# Patient Record
Sex: Female | Born: 1945 | Race: White | Hispanic: No | Marital: Married | Smoking: Never smoker
Health system: Southern US, Community
[De-identification: ages and names within clinical notes are randomized; demographics above are authoritative.]

## PROBLEM LIST (undated history)

## (undated) DIAGNOSIS — I10 Essential (primary) hypertension: Secondary | ICD-10-CM

## (undated) DIAGNOSIS — R42 Dizziness and giddiness: Secondary | ICD-10-CM

## (undated) DIAGNOSIS — E119 Type 2 diabetes mellitus without complications: Secondary | ICD-10-CM

## (undated) DIAGNOSIS — M1990 Unspecified osteoarthritis, unspecified site: Secondary | ICD-10-CM

## (undated) DIAGNOSIS — E785 Hyperlipidemia, unspecified: Secondary | ICD-10-CM

## (undated) DIAGNOSIS — K219 Gastro-esophageal reflux disease without esophagitis: Secondary | ICD-10-CM

## (undated) DIAGNOSIS — G459 Transient cerebral ischemic attack, unspecified: Secondary | ICD-10-CM

## (undated) HISTORY — PX: BREAST BIOPSY: SHX20

## (undated) HISTORY — DX: Type 2 diabetes mellitus without complications: E11.9

## (undated) HISTORY — DX: Hyperlipidemia, unspecified: E78.5

## (undated) HISTORY — DX: Essential (primary) hypertension: I10

## (undated) HISTORY — PX: ABDOMINAL HYSTERECTOMY: SHX81

## (undated) HISTORY — PX: TOTAL ABDOMINAL HYSTERECTOMY: SHX209

---

## 1988-03-24 HISTORY — PX: SIGMOIDOSCOPY: SUR1295

## 2010-07-03 ENCOUNTER — Ambulatory Visit: Payer: Self-pay | Admitting: Family Medicine

## 2011-07-16 ENCOUNTER — Ambulatory Visit: Payer: Self-pay | Admitting: Family Medicine

## 2014-08-16 DIAGNOSIS — E119 Type 2 diabetes mellitus without complications: Secondary | ICD-10-CM | POA: Diagnosis not present

## 2014-08-16 DIAGNOSIS — E785 Hyperlipidemia, unspecified: Secondary | ICD-10-CM | POA: Diagnosis not present

## 2014-08-16 DIAGNOSIS — I10 Essential (primary) hypertension: Secondary | ICD-10-CM | POA: Diagnosis not present

## 2014-09-12 ENCOUNTER — Other Ambulatory Visit: Payer: Self-pay | Admitting: Family Medicine

## 2014-09-12 DIAGNOSIS — Z1231 Encounter for screening mammogram for malignant neoplasm of breast: Secondary | ICD-10-CM

## 2014-09-20 ENCOUNTER — Ambulatory Visit
Admission: RE | Admit: 2014-09-20 | Discharge: 2014-09-20 | Disposition: A | Discharge status: Home / Self Care | Payer: Medicare Other | Source: Ambulatory Visit | Attending: Family Medicine | Admitting: Family Medicine

## 2014-09-20 ENCOUNTER — Other Ambulatory Visit: Payer: Self-pay | Admitting: Family Medicine

## 2014-09-20 DIAGNOSIS — Z1231 Encounter for screening mammogram for malignant neoplasm of breast: Secondary | ICD-10-CM | POA: Diagnosis not present

## 2014-09-20 LAB — HM MAMMOGRAPHY

## 2014-09-21 ENCOUNTER — Encounter: Payer: Self-pay | Admitting: Family Medicine

## 2014-11-15 ENCOUNTER — Ambulatory Visit: Payer: Self-pay | Admitting: Family Medicine

## 2015-03-15 ENCOUNTER — Other Ambulatory Visit: Payer: Self-pay | Admitting: Family Medicine

## 2015-03-15 NOTE — Telephone Encounter (Signed)
Patient was last seen in May 2016 ([facility] notes and labs reviewed) She was due to return in August Please let Caren HazyNancy J Malenfant know that she should see Dr. Laural BenesJOHNSON for an appointment here in the office for:  Diabetes, cholesterol, etc. Please schedule a visit with Dr. Laural BenesJohnson in the next: 2-3 weeks Fasting?  YES please Thank you, Dr. Kayleen MemosLada I'll approve limited medicine refill, but follow-up is very important

## 2015-03-28 ENCOUNTER — Encounter: Payer: Self-pay | Admitting: Family Medicine

## 2015-03-28 ENCOUNTER — Ambulatory Visit (INDEPENDENT_AMBULATORY_CARE_PROVIDER_SITE_OTHER): Payer: Medicare Other | Admitting: Family Medicine

## 2015-03-28 VITALS — BP 161/97 | HR 79 | Temp 97.9°F | Ht 64.6 in | Wt 201.0 lb

## 2015-03-28 DIAGNOSIS — Z9189 Other specified personal risk factors, not elsewhere classified: Secondary | ICD-10-CM | POA: Diagnosis not present

## 2015-03-28 DIAGNOSIS — E1122 Type 2 diabetes mellitus with diabetic chronic kidney disease: Secondary | ICD-10-CM | POA: Diagnosis not present

## 2015-03-28 DIAGNOSIS — F32A Depression, unspecified: Secondary | ICD-10-CM | POA: Insufficient documentation

## 2015-03-28 DIAGNOSIS — Z113 Encounter for screening for infections with a predominantly sexual mode of transmission: Secondary | ICD-10-CM | POA: Diagnosis not present

## 2015-03-28 DIAGNOSIS — F329 Major depressive disorder, single episode, unspecified: Secondary | ICD-10-CM

## 2015-03-28 DIAGNOSIS — N182 Chronic kidney disease, stage 2 (mild): Secondary | ICD-10-CM

## 2015-03-28 DIAGNOSIS — E039 Hypothyroidism, unspecified: Secondary | ICD-10-CM | POA: Diagnosis not present

## 2015-03-28 DIAGNOSIS — Z23 Encounter for immunization: Secondary | ICD-10-CM

## 2015-03-28 DIAGNOSIS — E119 Type 2 diabetes mellitus without complications: Secondary | ICD-10-CM | POA: Insufficient documentation

## 2015-03-28 DIAGNOSIS — R635 Abnormal weight gain: Secondary | ICD-10-CM

## 2015-03-28 DIAGNOSIS — I129 Hypertensive chronic kidney disease with stage 1 through stage 4 chronic kidney disease, or unspecified chronic kidney disease: Secondary | ICD-10-CM | POA: Insufficient documentation

## 2015-03-28 DIAGNOSIS — E785 Hyperlipidemia, unspecified: Secondary | ICD-10-CM | POA: Insufficient documentation

## 2015-03-28 LAB — MICROSCOPIC EXAMINATION: RBC, UA: NONE SEEN /hpf (ref 0–?)

## 2015-03-28 LAB — BAYER DCA HB A1C WAIVED: HB A1C: 7.4 % — AB (ref <7.0)

## 2015-03-28 LAB — LP+ALT+AST PICCOLO, WAIVED
ALT (SGPT) PICCOLO, WAIVED: 22 U/L (ref 10–47)
AST (SGOT) PICCOLO, WAIVED: 21 U/L (ref 11–38)
CHOLESTEROL PICCOLO, WAIVED: 268 mg/dL — AB (ref <200)
Chol/HDL Ratio Piccolo,Waive: 4 mg/dL
HDL Chol Piccolo, Waived: 67 mg/dL (ref >59)
LDL Chol Calc Piccolo Waived: 171 mg/dL — ABNORMAL HIGH (ref <100)
Triglycerides Piccolo,Waived: 152 mg/dL — ABNORMAL HIGH (ref <150)
VLDL Chol Calc Piccolo,Waive: 30 mg/dL — ABNORMAL HIGH (ref <30)

## 2015-03-28 LAB — MICROALBUMIN, URINE WAIVED
CREATININE, URINE WAIVED: 50 mg/dL (ref 10–300)
MICROALB, UR WAIVED: 30 mg/L — AB (ref 0–19)

## 2015-03-28 MED ORDER — METFORMIN HCL 1000 MG PO TABS: 1000.0000 mg | ORAL_TABLET | Freq: Two times a day (BID) | ORAL | Status: DC

## 2015-03-28 MED ORDER — GLIPIZIDE 5 MG PO TABS: 5.0000 mg | ORAL_TABLET | Freq: Every day | ORAL | Status: DC

## 2015-03-28 MED ORDER — LISINOPRIL-HYDROCHLOROTHIAZIDE 20-25 MG PO TABS: 2.0000 | ORAL_TABLET | Freq: Every day | ORAL | Status: DC

## 2015-03-28 MED ORDER — BUPROPION HCL ER (SR) 150 MG PO TB12: ORAL_TABLET | ORAL | Status: DC

## 2015-03-28 MED ORDER — LOVASTATIN 40 MG PO TABS: 40.0000 mg | ORAL_TABLET | Freq: Every day | ORAL | Status: DC

## 2015-03-28 NOTE — Assessment & Plan Note (Signed)
A1c going in the wrong direction. Likely due to dietary issues. Will work on diet and monitor. Recheck A1c in 3 months, no change to medication at this time.

## 2015-03-28 NOTE — Progress Notes (Signed)
BP 161/97 mmHg  Pulse 79  Temp(Src) 97.9 F (36.6 C)  Ht 5' 4.6" (1.641 m)  Wt 201 lb (91.173 kg)  BMI 33.86 kg/m2  SpO2 99%   Subjective:    Patient ID: Alison Hernandez, female    DOB: Nov 28, 1945, 70 y.o.   MRN: [id number]  HPI: Alison Hernandez is a 70 y.o. female  Chief Complaint  Patient presents with  . Diabetes  . Hypertension   DIABETES- only taking the metformin daily for the last week, has been eating a lot due to stress.  Hypoglycemic episodes:no Polydipsia/polyuria: yes Visual disturbance: no Chest pain: no Paresthesias: no Glucose Monitoring: no  Accucheck frequency: Not Checking Taking Insulin?: no Blood Pressure Monitoring: not checking Retinal Examination: Not up to Date Foot Exam: Up to Date Diabetic Education: Completed Pneumovax: Not up to Date Influenza: Up to Date Aspirin: no  HYPERTENSION / HYPERLIPIDEMIA Satisfied with current treatment? no Duration of hypertension: chronic BP monitoring frequency: not checking BP medication side effects: no Duration of hyperlipidemia: chronic Cholesterol medication side effects: Not on anything Cholesterol supplements: none Past cholesterol medications: atorvastain (lipitor), lovastatin (mevacor), pravastatin (pravachol) and simvastatin (zocor)- bad headaches on lipitor, body aches on zocor Medication compliance: Not on anything Aspirin: no Recent stressors: yes Recurrent headaches: no Visual changes: no Palpitations: no Dyspnea: no Chest pain: no Lower extremity edema: no Dizzy/lightheaded: no  DEPRESSION- really depressed. Home life is not good. Husband won't speak to her. Feels very depressed. Mood status: uncontrolled Satisfied with current treatment?: no Symptom severity: moderate  Duration of current treatment : Not on anything Psychotherapy/counseling: no  Previous psychiatric medications: None Depressed mood: yes Anxious mood: yes Anhedonia: yes Significant weight loss or gain:  yes Insomnia: no  Fatigue: yes Feelings of worthlessness or guilt: yes Impaired concentration/indecisiveness: yes Suicidal ideations: no Hopelessness: no Crying spells: yes Depression screen PHQ 2/9 03/28/2015  Decreased Interest 0  Down, Depressed, Hopeless 0  PHQ - 2 Score 0    Relevant past medical, surgical, family and social history reviewed and updated as indicated. Interim medical history since our last visit reviewed. Allergies and medications reviewed and updated.  Review of Systems  Constitutional: Negative.   Respiratory: Negative.   Cardiovascular: Negative.   Gastrointestinal: Negative.   Musculoskeletal: Negative.   Psychiatric/Behavioral: Negative.     Per HPI unless specifically indicated above     Objective:    BP 161/97 mmHg  Pulse 79  Temp(Src) 97.9 F (36.6 C)  Ht 5' 4.6" (1.641 m)  Wt 201 lb (91.173 kg)  BMI 33.86 kg/m2  SpO2 99%  Wt Readings from Last 3 Encounters:  03/28/15 201 lb (91.173 kg)  08/16/14 186 lb (84.369 kg)    Physical Exam  Constitutional: She is oriented to person, place, and time. She appears well-developed and well-nourished. No distress.  HENT:  Head: Normocephalic and atraumatic.  Right Ear: Hearing and external ear normal.  Left Ear: Hearing and external ear normal.  Nose: Nose normal.  Mouth/Throat: Oropharynx is clear and moist.  Eyes: Conjunctivae, EOM and lids are normal. Pupils are equal, round, and reactive to light. Right eye exhibits no discharge. Left eye exhibits no discharge. No scleral icterus.  Cardiovascular: Normal rate, regular rhythm, normal heart sounds and intact distal pulses.  Exam reveals no gallop and no friction rub.   No murmur heard. Pulmonary/Chest: Effort normal and breath sounds normal. No respiratory distress. She has no wheezes. She has no rales. She exhibits no tenderness.  Musculoskeletal: Normal  range of motion. She exhibits no edema or tenderness.  Neurological: She is alert and  oriented to person, place, and time.  Skin: Skin is warm, dry and intact. No rash noted. No erythema. No pallor.  Psychiatric: She has a normal mood and affect. Her speech is normal and behavior is normal. Judgment and thought content normal. Cognition and memory are normal.  Nursing note and vitals reviewed.   No results found for this or any previous visit.    Assessment & Plan:   Problem List Items Addressed This Visit      Endocrine   Diabetes (HCC) - Primary    A1c going in the wrong direction. Likely due to dietary issues. Will work on diet and monitor. Recheck A1c in 3 months, no change to medication at this time.       Relevant Medications   lovastatin (MEVACOR) 40 MG tablet   metFORMIN (GLUCOPHAGE) 1000 MG tablet   glipiZIDE (GLUCOTROL) 5 MG tablet   lisinopril-hydrochlorothiazide (PRINZIDE,ZESTORETIC) 20-25 MG tablet   Other Relevant Orders   Comprehensive metabolic panel   Bayer DCA Hb Z6X Waived   UA/M w/rflx Culture, Routine   Microalbumin, Urine Waived     Genitourinary   Benign hypertensive renal disease    Not well controlled. Will increase medication and check back in in 1 month. Call if BP going low or if she is getting dizzy, hopefully will be able to cut back down if she loses weight again.       Relevant Orders   Comprehensive metabolic panel   CBC With Differential/Platelet   UA/M w/rflx Culture, Routine   Microalbumin, Urine Waived     Other   Hyperlipidemia    Uncontrolled and going up. Will restart statin and check back in 1 month with repeat lipids and liver function to confirm tolerance.       Relevant Medications   lovastatin (MEVACOR) 40 MG tablet   lisinopril-hydrochlorothiazide (PRINZIDE,ZESTORETIC) 20-25 MG tablet   Other Relevant Orders   Comprehensive metabolic panel   LP+ALT+AST Piccolo, Waived   Depression    Not well controlled. Will try wellbutrin. Risks and benefits discussed. Continue to monitor and recheck in 1 month.        Relevant Medications   buPROPion (WELLBUTRIN SR) 150 MG 12 hr tablet    Other Visit Diagnoses    Weight gain        Will check labs. Likely due to poor diet. Work on diet and exercise and recheck in 1 month.     Relevant Orders    TSH    Immunization due        Flu given today. Td next visit if indicated. Will check on pneumovax status.    Relevant Orders    Flu Vaccine QUAD 36+ mos PF IM (Fluarix & Fluzone Quad PF) (Completed)    Routine screening for STI (sexually transmitted infection)        Labs ordered today. Await results.     Relevant Orders    Hepatitis C antibody    At high risk for osteoporosis        DEXA ordered today. Await results.     Relevant Orders    DG Bone Density        Follow up plan: Return in about 4 weeks (around 04/25/2015) for DM visit.

## 2015-03-28 NOTE — Assessment & Plan Note (Signed)
Uncontrolled and going up. Will restart statin and check back in 1 month with repeat lipids and liver function to confirm tolerance.

## 2015-03-28 NOTE — Assessment & Plan Note (Signed)
Not well controlled. Will increase medication and check back in in 1 month. Call if BP going low or if she is getting dizzy, hopefully will be able to cut back down if she loses weight again.

## 2015-03-28 NOTE — Assessment & Plan Note (Signed)
Not well controlled. Will try wellbutrin. Risks and benefits discussed. Continue to monitor and recheck in 1 month.

## 2015-03-29 ENCOUNTER — Encounter: Payer: Self-pay | Admitting: Family Medicine

## 2015-03-30 LAB — URINE CULTURE, REFLEX

## 2015-04-01 LAB — UA/M W/RFLX CULTURE, ROUTINE: Organism ID, Bacteria: NO GROWTH

## 2015-04-05 LAB — HEPATITIS C ANTIBODY: Hep C Virus Ab: <0.1 s/co ratio (ref 0.0–0.9)

## 2015-04-05 LAB — COMPREHENSIVE METABOLIC PANEL
A/G RATIO: 1.6 (ref 1.1–2.5)
ALBUMIN: 4.1 g/dL (ref 3.6–4.8)
ALT: 14 IU/L (ref 0–32)
AST: 13 IU/L (ref 0–40)
Alkaline Phosphatase: 85 IU/L (ref 39–117)
BILIRUBIN TOTAL: 0.2 mg/dL (ref 0.0–1.2)
BUN / CREAT RATIO: 18 (ref 11–26)
BUN: 19 mg/dL (ref 8–27)
CALCIUM: 9.2 mg/dL (ref 8.7–10.3)
CHLORIDE: 97 mmol/L (ref 96–106)
CO2: 23 mmol/L (ref 18–29)
Creatinine, Ser: 1.05 mg/dL — ABNORMAL HIGH (ref 0.57–1.00)
GFR, EST AFRICAN AMERICAN: 64 mL/min/{1.73_m2} (ref >59)
GFR, EST NON AFRICAN AMERICAN: 55 mL/min/{1.73_m2} — AB (ref >59)
Globulin, Total: 2.6 g/dL (ref 1.5–4.5)
Glucose: 153 mg/dL — ABNORMAL HIGH (ref 65–99)
POTASSIUM: 4.3 mmol/L (ref 3.5–5.2)
Sodium: 135 mmol/L (ref 134–144)
TOTAL PROTEIN: 6.7 g/dL (ref 6.0–8.5)

## 2015-04-05 LAB — CBC WITH DIFFERENTIAL/PLATELET

## 2015-04-05 LAB — TSH: TSH: 2.47 u[IU]/mL (ref 0.450–4.500)

## 2015-04-17 ENCOUNTER — Encounter: Payer: Self-pay | Admitting: Family Medicine

## 2015-04-17 ENCOUNTER — Telehealth: Payer: Self-pay | Admitting: Family Medicine

## 2015-04-17 ENCOUNTER — Ambulatory Visit (INDEPENDENT_AMBULATORY_CARE_PROVIDER_SITE_OTHER): Payer: Medicare Other | Admitting: Family Medicine

## 2015-04-17 VITALS — BP 125/74 | HR 83 | Temp 98.7°F | Ht 64.7 in | Wt 202.0 lb

## 2015-04-17 DIAGNOSIS — R609 Edema, unspecified: Secondary | ICD-10-CM

## 2015-04-17 NOTE — Progress Notes (Signed)
BP 125/74 mmHg  Pulse 83  Temp(Src) 98.7 F (37.1 C)  Ht 5' 4.7" (1.643 m)  Wt 202 lb (91.627 kg)  BMI 33.94 kg/m2  SpO2 99%   Subjective:    Patient ID: Alison Hernandez, female    DOB: 1945/10/31, 70 y.o.   MRN: [id number]  HPI: [name] is a 70 y.o. female  Chief Complaint  Patient presents with  . Leg Swelling    bilateral, started Sunday   Started with swelling in her legs on Sunday when she got home from work. She notes that it is bad at the end of the day, but lasting all day long. She notes that it is not better first thing in the morning. Feet still down when she's sleeping at night. Hurting a whole lot. Have not been red or painful. A little better today than they have been. She is otherwise feeling well. No other concerns or complaints.    Relevant past medical, surgical, family and social history reviewed and updated as indicated. Interim medical history since our last visit reviewed. Allergies and medications reviewed and updated.  Review of Systems  Constitutional: Negative.   Respiratory: Negative.   Cardiovascular: Positive for leg swelling. Negative for chest pain and palpitations.  Musculoskeletal: Negative.   Psychiatric/Behavioral: Negative.     Per HPI unless specifically indicated above     Objective:    BP 125/74 mmHg  Pulse 83  Temp(Src) 98.7 F (37.1 C)  Ht 5' 4.7" (1.643 m)  Wt 202 lb (91.627 kg)  BMI 33.94 kg/m2  SpO2 99%  Wt Readings from Last 3 Encounters:  04/17/15 202 lb (91.627 kg)  03/28/15 201 lb (91.173 kg)  08/16/14 186 lb (84.369 kg)    Physical Exam  Constitutional: She is oriented to person, place, and time. She appears well-developed and well-nourished. No distress.  HENT:  Head: Normocephalic and atraumatic.  Right Ear: Hearing normal.  Left Ear: Hearing normal.  Nose: Nose normal.  Eyes: Conjunctivae and lids are normal. Right eye exhibits no discharge. Left eye exhibits no discharge. No scleral icterus.   Cardiovascular: Normal rate, regular rhythm, normal heart sounds and intact distal pulses.  Exam reveals no gallop and no friction rub.   No murmur heard. Pulmonary/Chest: Effort normal and breath sounds normal. No respiratory distress. She has no wheezes. She has no rales. She exhibits no tenderness.  Musculoskeletal: She exhibits edema (tense edema bilaterally).  Neurological: She is alert and oriented to person, place, and time.  Skin: Skin is intact. No rash noted.  Psychiatric: She has a normal mood and affect. Her speech is normal and behavior is normal. Judgment and thought content normal. Cognition and memory are normal.  Nursing note and vitals reviewed. Negative Homan's, positive squeeze bilaterally, no redness, no pitting  Results for orders placed or performed in visit on 03/28/15  Microscopic Examination  Result Value Ref Range   WBC, UA 6-10 (A) 0 -  5 /hpf   RBC, UA None seen 0 -  2 /hpf   Epithelial Cells (non renal) 0-10 0 - 10 /hpf   Renal Epithel, UA 0-10 (A) None seen /hpf   Bacteria, UA Few None seen/Few  Comprehensive metabolic panel  Result Value Ref Range   Glucose 153 (H) 65 - 99 mg/dL   BUN 19 8 - 27 mg/dL   Creatinine, Ser 3.24 (H) 0.57 - 1.00 mg/dL   GFR calc non Af Amer 55 (L) >59 mL/min/1.73   GFR calc  Af Amer 64 >59 mL/min/1.73   BUN/Creatinine Ratio 18 11 - 26   Sodium 135 134 - 144 mmol/L   Potassium 4.3 3.5 - 5.2 mmol/L   Chloride 97 96 - 106 mmol/L   CO2 23 18 - 29 mmol/L   Calcium 9.2 8.7 - 10.3 mg/dL   Total Protein 6.7 6.0 - 8.5 g/dL   Albumin 4.1 3.6 - 4.8 g/dL   Globulin, Total 2.6 1.5 - 4.5 g/dL   Albumin/Globulin Ratio 1.6 1.1 - 2.5   Bilirubin Total 0.2 0.0 - 1.2 mg/dL   Alkaline Phosphatase 85 39 - 117 IU/L   AST 13 0 - 40 IU/L   ALT 14 0 - 32 IU/L  Bayer DCA Hb A1c Waived  Result Value Ref Range   Bayer DCA Hb A1c Waived 7.4 (H) <7.0 %  LP+ALT+AST Piccolo, Waived  Result Value Ref Range   ALT (SGPT) Piccolo, Waived 22 10 - 47  U/L   AST (SGOT) Piccolo, Waived 21 11 - 38 U/L   Cholesterol Piccolo, Waived 268 (H) <200 mg/dL   HDL Chol Piccolo, Waived 67 >59 mg/dL   Triglycerides Piccolo,Waived 152 (H) <150 mg/dL   Chol/HDL Ratio Piccolo,Waive 4.0 mg/dL   LDL Chol Calc Piccolo Waived 171 (H) <100 mg/dL   VLDL Chol Calc Piccolo,Waive 30 (H) <30 mg/dL  TSH  Result Value Ref Range   TSH 2.470 0.450 - 4.500 uIU/mL  UA/M w/rflx Culture, Routine  Result Value Ref Range   Urine Culture, Routine Final report    Urine Culture result 1 No growth   Microalbumin, Urine Waived  Result Value Ref Range   Microalb, Ur Waived 30 (H) 0 - 19 mg/L   Creatinine, Urine Waived 50 10 - 300 mg/dL   Microalb/Creat Ratio 30-300 (H) <30 mg/g  Hepatitis C antibody  Result Value Ref Range   Hep C Virus Ab <0.1 0.0 - 0.9 s/co ratio  Urine Culture, Routine  Result Value Ref Range   Urine Culture result 1 Comment   CBC with Differential/Platelet  Result Value Ref Range   WBC CANCELED x10E3/uL   RBC CANCELED    Hemoglobin CANCELED    Hematocrit CANCELED    Platelets CANCELED    Neutrophils CANCELED    Lymphs CANCELED    Monocytes CANCELED    Eos CANCELED    Lymphocytes Absolute CANCELED    EOS (ABSOLUTE) CANCELED    Basophils Absolute CANCELED       Assessment & Plan:   Problem List Items Addressed This Visit    None    Visit Diagnoses    Peripheral edema    -  Primary    Will check BMP today. Lungs clear. Elevate legs. Compression stockings. Recheck next week. Continue to monitor closely.    Relevant Orders    Basic metabolic panel        Follow up plan: Return Next week as scheduled.

## 2015-04-17 NOTE — Telephone Encounter (Signed)
pts daughter called and stated that her moms legs and feet started swelling and getting tight Sunday. She is unsure if this is due to the new medication. Pt would like a call back.

## 2015-04-17 NOTE — Telephone Encounter (Signed)
Pt scheduled at 3pm due to a cancellation.

## 2015-04-18 ENCOUNTER — Telehealth: Payer: Self-pay | Admitting: Family Medicine

## 2015-04-18 LAB — BASIC METABOLIC PANEL
BUN / CREAT RATIO: 15 (ref 11–26)
BUN: 16 mg/dL (ref 8–27)
CO2: 22 mmol/L (ref 18–29)
Calcium: 9.3 mg/dL (ref 8.7–10.3)
Chloride: 93 mmol/L — ABNORMAL LOW (ref 96–106)
Creatinine, Ser: 1.07 mg/dL — ABNORMAL HIGH (ref 0.57–1.00)
GFR calc Af Amer: 62 mL/min/{1.73_m2} (ref >59)
GFR, EST NON AFRICAN AMERICAN: 53 mL/min/{1.73_m2} — AB (ref >59)
GLUCOSE: 165 mg/dL — AB (ref 65–99)
POTASSIUM: 4.7 mmol/L (ref 3.5–5.2)
SODIUM: 132 mmol/L — AB (ref 134–144)

## 2015-04-18 NOTE — Telephone Encounter (Signed)
Called and discussed lab results with patient. Slightly dehydrated. Will drink a gatorade and will let us know if not feeling better.

## 2015-04-25 ENCOUNTER — Encounter: Payer: Self-pay | Admitting: Family Medicine

## 2015-04-25 ENCOUNTER — Ambulatory Visit (INDEPENDENT_AMBULATORY_CARE_PROVIDER_SITE_OTHER): Payer: Medicare Other | Admitting: Family Medicine

## 2015-04-25 VITALS — BP 154/75 | HR 72 | Temp 97.7°F | Ht 64.3 in | Wt 200.0 lb

## 2015-04-25 DIAGNOSIS — N182 Chronic kidney disease, stage 2 (mild): Secondary | ICD-10-CM

## 2015-04-25 DIAGNOSIS — E1122 Type 2 diabetes mellitus with diabetic chronic kidney disease: Secondary | ICD-10-CM | POA: Diagnosis not present

## 2015-04-25 DIAGNOSIS — F329 Major depressive disorder, single episode, unspecified: Secondary | ICD-10-CM | POA: Diagnosis not present

## 2015-04-25 DIAGNOSIS — I129 Hypertensive chronic kidney disease with stage 1 through stage 4 chronic kidney disease, or unspecified chronic kidney disease: Secondary | ICD-10-CM | POA: Diagnosis not present

## 2015-04-25 DIAGNOSIS — F32A Depression, unspecified: Secondary | ICD-10-CM

## 2015-04-25 MED ORDER — BUPROPION HCL ER (SR) 150 MG PO TB12: 150.0000 mg | ORAL_TABLET | Freq: Two times a day (BID) | ORAL | Status: DC

## 2015-04-25 NOTE — Assessment & Plan Note (Signed)
Has been really working on her diet. Due for A1c recheck in 2 months.

## 2015-04-25 NOTE — Patient Instructions (Signed)
Diabetes and Standards of Medical Care Diabetes is complicated. You may find that your diabetes team includes a dietitian, nurse, diabetes educator, eye doctor, and more. To help everyone know what is going on and to help you get the care you deserve, the following schedule of care was developed to help keep you on track. Below are the tests, exams, vaccines, medicines, education, and plans you will need. HbA1c test This test shows how well you have controlled your glucose over the past 2-3 months. It is used to see if your diabetes management plan needs to be adjusted.   It is performed at least 2 times a year if you are meeting treatment goals.  It is performed 4 times a year if therapy has changed or if you are not meeting treatment goals. Blood pressure test  This test is performed at every routine medical visit. The goal is less than 140/90 mm Hg for most people, but 130/80 mm Hg in some cases. Ask your health care provider about your goal. Dental exam  Follow up with the dentist regularly. Eye exam  If you are diagnosed with type 1 diabetes as a child, get an exam upon reaching the age of 80 years or older and having had diabetes for 3-5 years. Yearly eye exams are recommended after that initial eye exam.  If you are diagnosed with type 1 diabetes as an adult, get an exam within 5 years of diagnosis and then yearly.  If you are diagnosed with type 2 diabetes, get an exam as soon as possible after the diagnosis and then yearly. Foot care exam  Visual foot exams are performed at every routine medical visit. The exams check for cuts, injuries, or other problems with the feet.  You should have a complete foot exam performed every year. This exam includes an inspection of the structure and skin of your feet, a check of the pulses in your feet, and a check of the sensation in your feet.  Type 1 diabetes: The first exam is performed 5 years after diagnosis.  Type 2 diabetes: The first  exam is performed at the time of diagnosis.  Check your feet nightly for cuts, injuries, or other problems with your feet. Tell your health care provider if anything is not healing. Kidney function test (urine microalbumin)  This test is performed once a year.  Type 1 diabetes: The first test is performed 5 years after diagnosis.  Type 2 diabetes: The first test is performed at the time of diagnosis.  A serum creatinine and estimated glomerular filtration rate (eGFR) test is done once a year to assess the level of chronic kidney disease (CKD), if present. Lipid profile (cholesterol, HDL, LDL, triglycerides)  Performed every 5 years for most people.  The goal for LDL is less than 100 mg/dL. If you are at high risk, the goal is less than 70 mg/dL.  The goal for HDL is 40 mg/dL-50 mg/dL for men and 50 mg/dL-60 mg/dL for women. An HDL cholesterol of 60 mg/dL or higher gives some protection against heart disease.  The goal for triglycerides is less than 150 mg/dL. Immunizations  The flu (influenza) vaccine is recommended yearly for every person 68 months of age or older who has diabetes.  The pneumonia (pneumococcal) vaccine is recommended for every person 68 years of age or older who has diabetes. Adults 68 years of age or older may receive the pneumonia vaccine as a series of two separate shots.  The hepatitis B  vaccine is recommended for adults shortly after they have been diagnosed with diabetes.  The Tdap (tetanus, diphtheria, and pertussis) vaccine should be given:  According to normal childhood vaccination schedules, for children.  Every 10 years, for adults who have diabetes. Diabetes self-management education  Education is recommended at diagnosis and ongoing as needed. Treatment plan  Your treatment plan is reviewed at every medical visit.   This information is not intended to replace advice given to you by your health care provider. Make sure you discuss any questions you  have with your health care provider.   Document Released: 01/05/2009 Document Revised: 03/31/2014 Document Reviewed: 08/10/2012 Elsevier Interactive Patient Education 2016 [employer]  

## 2015-04-25 NOTE — Assessment & Plan Note (Addendum)
Better on recheck, but still high. However, she is doing well at home. Continue current regimen. Continue DASH diet. Continue to monitor. Recheck at follow up in 2 months for DM.

## 2015-04-25 NOTE — Progress Notes (Signed)
BP 154/75 mmHg  Pulse 72  Temp(Src) 97.7 F (36.5 C)  Ht 5' 4.3" (1.633 m)  Wt 200 lb (90.719 kg)  BMI 34.02 kg/m2  SpO2 100%   Subjective:    Patient ID: Alison Hernandez, female    DOB: 07-Jan-1946, 70 y.o.   MRN: [id number]  HPI: Alison Hernandez is a 70 y.o. female  Chief Complaint  Patient presents with  . Hypertension  . Depression   HYPERTENSION Hypertension status: uncontrolled  Satisfied with current treatment? yes Duration of hypertension: chronic BP monitoring frequency:  Couple of times a week BP range- 120s/80s BP medication side effects:  no Medication compliance: good compliance Aspirin: no Recurrent headaches: no Visual changes: no Palpitations: no Dyspnea: no Chest pain: no Lower extremity edema: no Dizzy/lightheaded: no  DEPRESSION Mood status: better Satisfied with current treatment?: yes Symptom severity: mild  Duration of current treatment : 1 month Side effects: no Medication compliance: excellent compliance Psychotherapy/counseling:  no Depressed mood: no Anxious mood: yes Anhedonia: no Significant weight loss or gain: no Insomnia: no  Fatigue: no Feelings of worthlessness or guilt: no Impaired concentration/indecisiveness: no Suicidal ideations: no Hopelessness: no Crying spells: no Depression screen [facility] 2/9 04/25/2015 03/28/2015  Decreased Interest 0 0  Down, Depressed, Hopeless 0 0  PHQ - 2 Score 0 0   GAD 7 : Generalized Anxiety Score 04/25/2015  Nervous, Anxious, on Edge 0  Control/stop worrying 0  Worry too much - different things 0  Trouble relaxing 0  Restless 0  Easily annoyed or irritable 0  Afraid - awful might happen 0  Total GAD 7 Score 0  Anxiety Difficulty Somewhat difficult   Relevant past medical, surgical, family and social history reviewed and updated as indicated. Interim medical history since our last visit reviewed. Allergies and medications reviewed and updated.  Review of Systems  Constitutional:  Negative.   Respiratory: Negative.   Cardiovascular: Negative.   Gastrointestinal: Negative.   Psychiatric/Behavioral: Negative.     Per HPI unless specifically indicated above     Objective:    BP 154/75 mmHg  Pulse 72  Temp(Src) 97.7 F (36.5 C)  Ht 5' 4.3" (1.633 m)  Wt 200 lb (90.719 kg)  BMI 34.02 kg/m2  SpO2 100%  Wt Readings from Last 3 Encounters:  04/25/15 200 lb (90.719 kg)  04/17/15 202 lb (91.627 kg)  03/28/15 201 lb (91.173 kg)    Physical Exam  Constitutional: She is oriented to person, place, and time. She appears well-developed and well-nourished. No distress.  HENT:  Head: Normocephalic and atraumatic.  Right Ear: Hearing and external ear normal.  Left Ear: Hearing and external ear normal.  Nose: Nose normal.  Mouth/Throat: Oropharynx is clear and moist. No oropharyngeal exudate.  Eyes: Conjunctivae and lids are normal. Right eye exhibits no discharge. Left eye exhibits no discharge. No scleral icterus.  Cardiovascular: Normal rate, regular rhythm, normal heart sounds and intact distal pulses.  Exam reveals no gallop and no friction rub.   No murmur heard. Pulmonary/Chest: Effort normal and breath sounds normal. No respiratory distress. She has no wheezes. She has no rales. She exhibits no tenderness.  Musculoskeletal: Normal range of motion.  Neurological: She is alert and oriented to person, place, and time.  Skin: Skin is warm, dry and intact. No rash noted. She is not diaphoretic. No erythema. No pallor.  Psychiatric: She has a normal mood and affect. Her speech is normal and behavior is normal. Judgment and thought content normal. Cognition  and memory are normal.  Nursing note and vitals reviewed.   Results for orders placed or performed in visit on 04/17/15  Basic metabolic panel  Result Value Ref Range   Glucose 165 (H) 65 - 99 mg/dL   BUN 16 8 - 27 mg/dL   Creatinine, Ser 1.61 (H) 0.57 - 1.00 mg/dL   GFR calc non Af Amer 53 (L) >59  mL/min/1.73   GFR calc Af Amer 62 >59 mL/min/1.73   BUN/Creatinine Ratio 15 11 - 26   Sodium 132 (L) 134 - 144 mmol/L   Potassium 4.7 3.5 - 5.2 mmol/L   Chloride 93 (L) 96 - 106 mmol/L   CO2 22 18 - 29 mmol/L   Calcium 9.3 8.7 - 10.3 mg/dL      Assessment & Plan:   Problem List Items Addressed This Visit      Endocrine   Diabetes (HCC)    Has been really working on her diet. Due for A1c recheck in 2 months.         Genitourinary   Benign hypertensive renal disease - Primary    Better on recheck, but still high. However, she is doing well at home. Continue current regimen. Continue DASH diet. Continue to monitor. Recheck at follow up in 2 months for DM.        Other   Depression    Doing really well on her wellbutrin! No side effects and feeling better. Continue current regimen. Continue to monitor. Recheck in 6 months.       Relevant Medications   buPROPion (WELLBUTRIN SR) 150 MG 12 hr tablet       Follow up plan: Return in about 2 months (around 06/23/2015) for DM visit.

## 2015-04-25 NOTE — Assessment & Plan Note (Signed)
Doing really well on her wellbutrin! No side effects and feeling better. Continue current regimen. Continue to monitor. Recheck in 6 months.

## 2015-06-27 ENCOUNTER — Ambulatory Visit: Payer: Medicare Other | Admitting: Family Medicine

## 2015-07-24 ENCOUNTER — Other Ambulatory Visit: Payer: Self-pay | Admitting: Family Medicine

## 2015-08-01 ENCOUNTER — Ambulatory Visit: Payer: Medicare Other | Admitting: Family Medicine

## 2015-08-23 ENCOUNTER — Other Ambulatory Visit: Payer: Self-pay | Admitting: Family Medicine

## 2015-08-28 ENCOUNTER — Other Ambulatory Visit: Payer: Self-pay | Admitting: Family Medicine

## 2015-08-28 DIAGNOSIS — N182 Chronic kidney disease, stage 2 (mild): Principal | ICD-10-CM

## 2015-08-28 DIAGNOSIS — E1122 Type 2 diabetes mellitus with diabetic chronic kidney disease: Secondary | ICD-10-CM

## 2015-08-29 ENCOUNTER — Encounter: Payer: Self-pay | Admitting: Family Medicine

## 2015-08-29 ENCOUNTER — Ambulatory Visit (INDEPENDENT_AMBULATORY_CARE_PROVIDER_SITE_OTHER): Payer: Medicare Other | Admitting: Family Medicine

## 2015-08-29 VITALS — BP 146/81 | HR 82 | Temp 98.6°F | Wt 203.0 lb

## 2015-08-29 DIAGNOSIS — E1122 Type 2 diabetes mellitus with diabetic chronic kidney disease: Secondary | ICD-10-CM

## 2015-08-29 DIAGNOSIS — I129 Hypertensive chronic kidney disease with stage 1 through stage 4 chronic kidney disease, or unspecified chronic kidney disease: Secondary | ICD-10-CM

## 2015-08-29 DIAGNOSIS — Z23 Encounter for immunization: Secondary | ICD-10-CM

## 2015-08-29 DIAGNOSIS — N182 Chronic kidney disease, stage 2 (mild): Secondary | ICD-10-CM

## 2015-08-29 DIAGNOSIS — R609 Edema, unspecified: Secondary | ICD-10-CM | POA: Diagnosis not present

## 2015-08-29 DIAGNOSIS — M1A9XX0 Chronic gout, unspecified, without tophus (tophi): Secondary | ICD-10-CM

## 2015-08-29 LAB — BAYER DCA HB A1C WAIVED: HB A1C (BAYER DCA - WAIVED): 7.6 % — ABNORMAL HIGH (ref <7.0)

## 2015-08-29 MED ORDER — LISINOPRIL-HYDROCHLOROTHIAZIDE 20-25 MG PO TABS: 2.0000 | ORAL_TABLET | Freq: Every day | ORAL | Status: DC

## 2015-08-29 MED ORDER — METFORMIN HCL 1000 MG PO TABS: 1000.0000 mg | ORAL_TABLET | Freq: Two times a day (BID) | ORAL | Status: DC

## 2015-08-29 NOTE — Progress Notes (Addendum)
BP 146/81 mmHg  Pulse 82  Temp(Src) 98.6 F (37 C)  Wt 203 lb (92.08 kg)  SpO2 99%   Subjective:    Patient ID: Alison Hernandez, female    DOB: March 11, 1946, 70 y.o.   MRN: [id number]  HPI: Alison Hernandez is a 70 y.o. female  Chief Complaint  Patient presents with  . Diabetes   DIABETES Hypoglycemic episodes:no Polydipsia/polyuria: yes Visual disturbance: no Chest pain: no Paresthesias: no Glucose Monitoring: no Taking Insulin?: no Blood Pressure Monitoring: not checking Retinal Examination: Not up to Date Foot Exam: Up to Date Diabetic Education: Completed Pneumovax: Given today Influenza: Up to Date Aspirin: no  Swelling in her legs does not seem to be getting better. Her legs hurt a lot at the end of the day. Concerned it's her gout as she has been off her allopurinol for quite a while. Has not been able to wear her compression stockings as she can't find any that fit her. She is otherwise doing OK with no other concerns.   Relevant past medical, surgical, family and social history reviewed and updated as indicated. Interim medical history since our last visit reviewed. Allergies and medications reviewed and updated.  Review of Systems  Constitutional: Negative.   Respiratory: Negative.   Cardiovascular: Positive for leg swelling.  Musculoskeletal: Positive for arthralgias. Negative for myalgias, back pain, joint swelling, gait problem, neck pain and neck stiffness.  Psychiatric/Behavioral: Negative.     Per HPI unless specifically indicated above     Objective:    BP 146/81 mmHg  Pulse 82  Temp(Src) 98.6 F (37 C)  Wt 203 lb (92.08 kg)  SpO2 99%  Wt Readings from Last 3 Encounters:  08/29/15 203 lb (92.08 kg)  04/25/15 200 lb (90.719 kg)  04/17/15 202 lb (91.627 kg)    Physical Exam  Constitutional: She is oriented to person, place, and time. She appears well-developed and well-nourished. No distress.  HENT:  Head: Normocephalic and atraumatic.   Right Ear: Hearing normal.  Left Ear: Hearing normal.  Nose: Nose normal.  Eyes: Conjunctivae and lids are normal. Right eye exhibits no discharge. Left eye exhibits no discharge. No scleral icterus.  Cardiovascular: Normal rate, regular rhythm, normal heart sounds and intact distal pulses.  Exam reveals no gallop and no friction rub.   No murmur heard. Pulmonary/Chest: Effort normal and breath sounds normal. No respiratory distress. She has no wheezes. She has no rales. She exhibits no tenderness.  Musculoskeletal: Normal range of motion. She exhibits edema (1+ edema bilaterally, No redness, no heat).  Neurological: She is alert and oriented to person, place, and time.  Skin: Skin is warm, dry and intact. No rash noted. No erythema. No pallor.  Psychiatric: She has a normal mood and affect. Her speech is normal and behavior is normal. Judgment and thought content normal. Cognition and memory are normal.  Nursing note and vitals reviewed.   Results for orders placed or performed in visit on 04/17/15  Basic metabolic panel  Result Value Ref Range   Glucose 165 (H) 65 - 99 mg/dL   BUN 16 8 - 27 mg/dL   Creatinine, Ser 4.09 (H) 0.57 - 1.00 mg/dL   GFR calc non Af Amer 53 (L) >59 mL/min/1.73   GFR calc Af Amer 62 >59 mL/min/1.73   BUN/Creatinine Ratio 15 11 - 26   Sodium 132 (L) 134 - 144 mmol/L   Potassium 4.7 3.5 - 5.2 mmol/L   Chloride 93 (L) 96 - 106  mmol/L   CO2 22 18 - 29 mmol/L   Calcium 9.3 8.7 - 10.3 mg/dL      Assessment & Plan:   Problem List Items Addressed This Visit      Endocrine   Diabetes (HCC) - Primary    Stable. Would like it to be a little lower. Jardiance appears to be covered. Will start pending renal function tomorrow.       Relevant Medications   lisinopril-hydrochlorothiazide (PRINZIDE,ZESTORETIC) 20-25 MG tablet   metFORMIN (GLUCOPHAGE) 1000 MG tablet   Other Relevant Orders   Comprehensive metabolic panel     Genitourinary   Benign hypertensive  renal disease   Relevant Orders   Comprehensive metabolic panel    Other Visit Diagnoses    Immunization due        Prevnar given today.    Relevant Orders    Pneumococcal conjugate vaccine 13-valent IM (Completed)    Peripheral edema        Rx for compression stockings given today. Will go to [employer] to be fit. She is aware.    Relevant Orders    Comprehensive metabolic panel    Chronic gout without tophus, unspecified cause, unspecified site        Has not been on medicine in a while. Current discomfort unlikely gout. Will check labs and treat as needed.    Relevant Orders    Comprehensive metabolic panel    Uric acid    CBC with Differential/Platelet        Follow up plan: Return in about 4 weeks (around 09/26/2015) for Check on medications/Depression/HLD.

## 2015-08-29 NOTE — Assessment & Plan Note (Signed)
Stable. Would like it to be a little lower. Jardiance appears to be covered. Will start pending renal function tomorrow.

## 2015-08-30 ENCOUNTER — Telehealth: Payer: Self-pay | Admitting: Family Medicine

## 2015-08-30 DIAGNOSIS — D649 Anemia, unspecified: Secondary | ICD-10-CM | POA: Insufficient documentation

## 2015-08-30 LAB — CBC WITH DIFFERENTIAL/PLATELET
BASOS: 0 %
Basophils Absolute: 0 10*3/uL (ref 0.0–0.2)
EOS (ABSOLUTE): 0.2 10*3/uL (ref 0.0–0.4)
EOS: 4 %
HEMATOCRIT: 29.4 % — AB (ref 34.0–46.6)
HEMOGLOBIN: 10.1 g/dL — AB (ref 11.1–15.9)
Immature Grans (Abs): 0 10*3/uL (ref 0.0–0.1)
Immature Granulocytes: 0 %
LYMPHS ABS: 1.9 10*3/uL (ref 0.7–3.1)
Lymphs: 28 %
MCH: 30 pg (ref 26.6–33.0)
MCHC: 34.4 g/dL (ref 31.5–35.7)
MCV: 87 fL (ref 79–97)
MONOCYTES: 8 %
Monocytes Absolute: 0.6 10*3/uL (ref 0.1–0.9)
NEUTROS ABS: 4 10*3/uL (ref 1.4–7.0)
Neutrophils: 60 %
Platelets: 270 10*3/uL (ref 150–379)
RBC: 3.37 x10E6/uL — ABNORMAL LOW (ref 3.77–5.28)
RDW: 13.3 % (ref 12.3–15.4)
WBC: 6.6 10*3/uL (ref 3.4–10.8)

## 2015-08-30 LAB — COMPREHENSIVE METABOLIC PANEL
A/G RATIO: 1.7 (ref 1.2–2.2)
ALBUMIN: 4.1 g/dL (ref 3.6–4.8)
ALT: 15 IU/L (ref 0–32)
AST: 16 IU/L (ref 0–40)
Alkaline Phosphatase: 97 IU/L (ref 39–117)
BUN / CREAT RATIO: 20 (ref 12–28)
BUN: 22 mg/dL (ref 8–27)
Bilirubin Total: 0.3 mg/dL (ref 0.0–1.2)
CALCIUM: 9.2 mg/dL (ref 8.7–10.3)
CO2: 22 mmol/L (ref 18–29)
CREATININE: 1.09 mg/dL — AB (ref 0.57–1.00)
Chloride: 102 mmol/L (ref 96–106)
GFR calc Af Amer: 60 mL/min/{1.73_m2} (ref >59)
GFR, EST NON AFRICAN AMERICAN: 52 mL/min/{1.73_m2} — AB (ref >59)
GLOBULIN, TOTAL: 2.4 g/dL (ref 1.5–4.5)
Glucose: 152 mg/dL — ABNORMAL HIGH (ref 65–99)
POTASSIUM: 4.4 mmol/L (ref 3.5–5.2)
SODIUM: 138 mmol/L (ref 134–144)
Total Protein: 6.5 g/dL (ref 6.0–8.5)

## 2015-08-30 LAB — URIC ACID: Uric Acid: 7 mg/dL (ref 2.5–7.1)

## 2015-08-30 MED ORDER — FERROUS SULFATE 325 (65 FE) MG PO TABS: 325.0000 mg | ORAL_TABLET | Freq: Two times a day (BID) | ORAL | Status: DC

## 2015-08-30 MED ORDER — EMPAGLIFLOZIN 10 MG PO TABS: 10.0000 mg | ORAL_TABLET | Freq: Every day | ORAL | Status: DC

## 2015-08-30 NOTE — Telephone Encounter (Signed)
Called and discussed results with patient. Anemia may be causing leg pain. Will start iron. No need for allopurinol as uric acid is normal at this time. Kidney function stable. Will start jardiance. Continue to monitor.

## 2015-10-03 ENCOUNTER — Ambulatory Visit: Payer: Medicare Other | Admitting: Family Medicine

## 2015-10-25 ENCOUNTER — Encounter: Payer: Self-pay | Admitting: Family Medicine

## 2015-10-25 ENCOUNTER — Ambulatory Visit (INDEPENDENT_AMBULATORY_CARE_PROVIDER_SITE_OTHER): Payer: Medicare Other | Admitting: Family Medicine

## 2015-10-25 VITALS — BP 132/81 | HR 84 | Temp 99.0°F | Wt 197.0 lb

## 2015-10-25 DIAGNOSIS — E785 Hyperlipidemia, unspecified: Secondary | ICD-10-CM | POA: Diagnosis not present

## 2015-10-25 DIAGNOSIS — N182 Chronic kidney disease, stage 2 (mild): Secondary | ICD-10-CM

## 2015-10-25 DIAGNOSIS — F329 Major depressive disorder, single episode, unspecified: Secondary | ICD-10-CM | POA: Diagnosis not present

## 2015-10-25 DIAGNOSIS — I129 Hypertensive chronic kidney disease with stage 1 through stage 4 chronic kidney disease, or unspecified chronic kidney disease: Secondary | ICD-10-CM | POA: Diagnosis not present

## 2015-10-25 DIAGNOSIS — F32A Depression, unspecified: Secondary | ICD-10-CM

## 2015-10-25 DIAGNOSIS — E1122 Type 2 diabetes mellitus with diabetic chronic kidney disease: Secondary | ICD-10-CM

## 2015-10-25 LAB — LIPID PANEL PICCOLO, WAIVED
CHOL/HDL RATIO PICCOLO,WAIVE: 4.2 mg/dL
Cholesterol Piccolo, Waived: 228 mg/dL — ABNORMAL HIGH (ref <200)
HDL Chol Piccolo, Waived: 54 mg/dL — ABNORMAL LOW (ref >59)
LDL Chol Calc Piccolo Waived: 141 mg/dL — ABNORMAL HIGH (ref <100)
TRIGLYCERIDES PICCOLO,WAIVED: 162 mg/dL — AB (ref <150)
VLDL CHOL CALC PICCOLO,WAIVE: 32 mg/dL — AB (ref <30)

## 2015-10-25 NOTE — Assessment & Plan Note (Signed)
Better on recheck. Continue current regimen. Continue to monitor. Call with any concerns.  

## 2015-10-25 NOTE — Assessment & Plan Note (Addendum)
Tolerating medicine well. Continue current regimen. Recheck 1 month with A1c.

## 2015-10-25 NOTE — Assessment & Plan Note (Signed)
Still not under good control. Will retry lovastatin and if not feeling well on it, will stop it.

## 2015-10-25 NOTE — Assessment & Plan Note (Signed)
Under good control. Continue current regimen. Cotninue to monitor. Call with any concerns.

## 2015-10-25 NOTE — Progress Notes (Signed)
BP 132/81   Pulse 84   Temp 99 F (37.2 C)   Wt 197 lb (89.4 kg)   SpO2 99%   BMI 33.50 kg/m    Subjective:    Patient ID: Alison Hernandez, female    DOB: 1945-03-30, 70 y.o.   MRN: [id number]  HPI: Alison Hernandez is a 70 y.o. female  Chief Complaint  Patient presents with  . Depression  . Diabetes  . Hyperlipidemia   DEPRESSION Mood status: better Satisfied with current treatment?: yes Symptom severity: mild  Duration of current treatment : months Side effects: no Medication compliance: excellent compliance Psychotherapy/counseling: no  Depressed mood: no Anxious mood: no Anhedonia: no Significant weight loss or gain: no Insomnia: no  Fatigue: no Feelings of worthlessness or guilt: no Impaired concentration/indecisiveness: no Suicidal ideations: no Hopelessness: no Crying spells: no Depression screen [facility] 2/9 10/25/2015 08/29/2015 04/25/2015 03/28/2015  Decreased Interest 0 0 0 0  Down, Depressed, Hopeless 0 0 0 0  PHQ - 2 Score 0 0 0 0  Altered sleeping 0 - - -  Tired, decreased energy 0 - - -  Change in appetite 0 - - -  Feeling bad or failure about yourself  0 - - -  Trouble concentrating 0 - - -  Moving slowly or fidgety/restless 0 - - -  Suicidal thoughts 0 - - -  PHQ-9 Score 0 - - -   DIABETES- started on jardiance last visit.  Hypoglycemic episodes:no Polydipsia/polyuria: yes Visual disturbance: no Chest pain: no Paresthesias: no Glucose Monitoring: no Taking Insulin?: no Blood Pressure Monitoring: not checking Retinal Examination: Not up to Date Foot Exam: Up to Date Diabetic Education: Completed Pneumovax: Up to Date Influenza: Up to Date Aspirin: no  HYPERLIPIDEMIA Hyperlipidemia status: Not on anything Satisfied with current treatment?  yes Side effects:  Not on anything Past cholesterol meds: lovastain Supplements: none Aspirin:  no Chest pain:  no Coronary artery disease:  no Family history CAD:  yes  Relevant past medical,  surgical, family and social history reviewed and updated as indicated. Interim medical history since our last visit reviewed. Allergies and medications reviewed and updated.  Review of Systems  Constitutional: Negative.   Respiratory: Negative.   Cardiovascular: Negative.   Psychiatric/Behavioral: Negative.     Per HPI unless specifically indicated above     Objective:    BP 132/81   Pulse 84   Temp 99 F (37.2 C)   Wt 197 lb (89.4 kg)   SpO2 99%   BMI 33.50 kg/m   Wt Readings from Last 3 Encounters:  10/25/15 197 lb (89.4 kg)  08/29/15 203 lb (92.1 kg)  04/25/15 200 lb (90.7 kg)    Physical Exam  Constitutional: She is oriented to person, place, and time. She appears well-developed and well-nourished. No distress.  HENT:  Head: Normocephalic and atraumatic.  Right Ear: Hearing normal.  Left Ear: Hearing normal.  Nose: Nose normal.  Eyes: Conjunctivae and lids are normal. Right eye exhibits no discharge. Left eye exhibits no discharge. No scleral icterus.  Cardiovascular: Normal rate, regular rhythm, normal heart sounds and intact distal pulses.  Exam reveals no gallop and no friction rub.   No murmur heard. Pulmonary/Chest: Effort normal and breath sounds normal. No respiratory distress. She has no wheezes. She has no rales.  Musculoskeletal: Normal range of motion. She exhibits no edema.  Neurological: She is alert and oriented to person, place, and time.  Skin: Skin is warm, dry and  intact. No rash noted. She is not diaphoretic. No erythema. No pallor.  Psychiatric: She has a normal mood and affect. Her speech is normal and behavior is normal. Judgment and thought content normal. Cognition and memory are normal.  Nursing note and vitals reviewed.   Results for orders placed or performed in visit on 08/29/15  Bayer DCA Hb A1c Waived  Result Value Ref Range   Bayer DCA Hb A1c Waived 7.6 (H) <7.0 %  Comprehensive metabolic panel  Result Value Ref Range   Glucose  152 (H) 65 - 99 mg/dL   BUN 22 8 - 27 mg/dL   Creatinine, Ser 2.94 (H) 0.57 - 1.00 mg/dL   GFR calc non Af Amer 52 (L) >59 mL/min/1.73   GFR calc Af Amer 60 >59 mL/min/1.73   BUN/Creatinine Ratio 20 12 - 28   Sodium 138 134 - 144 mmol/L   Potassium 4.4 3.5 - 5.2 mmol/L   Chloride 102 96 - 106 mmol/L   CO2 22 18 - 29 mmol/L   Calcium 9.2 8.7 - 10.3 mg/dL   Total Protein 6.5 6.0 - 8.5 g/dL   Albumin 4.1 3.6 - 4.8 g/dL   Globulin, Total 2.4 1.5 - 4.5 g/dL   Albumin/Globulin Ratio 1.7 1.2 - 2.2   Bilirubin Total 0.3 0.0 - 1.2 mg/dL   Alkaline Phosphatase 97 39 - 117 IU/L   AST 16 0 - 40 IU/L   ALT 15 0 - 32 IU/L  Uric acid  Result Value Ref Range   Uric Acid 7.0 2.5 - 7.1 mg/dL  CBC with Differential/Platelet  Result Value Ref Range   WBC 6.6 3.4 - 10.8 x10E3/uL   RBC 3.37 (L) 3.77 - 5.28 x10E6/uL   Hemoglobin 10.1 (L) 11.1 - 15.9 g/dL   Hematocrit 76.5 (L) 46.5 - 46.6 %   MCV 87 79 - 97 fL   MCH 30.0 26.6 - 33.0 pg   MCHC 34.4 31.5 - 35.7 g/dL   RDW 03.5 46.5 - 68.1 %   Platelets 270 150 - 379 x10E3/uL   Neutrophils 60 %   Lymphs 28 %   Monocytes 8 %   Eos 4 %   Basos 0 %   Neutrophils Absolute 4.0 1.4 - 7.0 x10E3/uL   Lymphocytes Absolute 1.9 0.7 - 3.1 x10E3/uL   Monocytes Absolute 0.6 0.1 - 0.9 x10E3/uL   EOS (ABSOLUTE) 0.2 0.0 - 0.4 x10E3/uL   Basophils Absolute 0.0 0.0 - 0.2 x10E3/uL   Immature Granulocytes 0 %   Immature Grans (Abs) 0.0 0.0 - 0.1 x10E3/uL      Assessment & Plan:   Problem List Items Addressed This Visit      Endocrine   Diabetes (HCC) - Primary    Tolerating medicine well. Continue current regimen. Recheck 1 month with A1c.      Relevant Orders   Comprehensive metabolic panel     Genitourinary   Benign hypertensive renal disease    Better on recheck. Continue current regimen. Continue to monitor. Call with any concerns.       Relevant Orders   Comprehensive metabolic panel     Other   Hyperlipidemia    Still not under good  control. Will retry lovastatin and if not feeling well on it, will stop it.       Relevant Orders   Comprehensive metabolic panel   Lipid Panel Piccolo, Waived   Depression    Under good control. Continue current regimen. Cotninue to monitor. Call with any  concerns.        Other Visit Diagnoses   None.      Follow up plan: Return in about 4 weeks (around 11/22/2015) for DM visit.

## 2015-10-26 ENCOUNTER — Telehealth: Payer: Self-pay | Admitting: Family Medicine

## 2015-10-26 DIAGNOSIS — N289 Disorder of kidney and ureter, unspecified: Secondary | ICD-10-CM

## 2015-10-26 LAB — COMPREHENSIVE METABOLIC PANEL
ALK PHOS: 85 IU/L (ref 39–117)
ALT: 14 IU/L (ref 0–32)
AST: 13 IU/L (ref 0–40)
Albumin/Globulin Ratio: 1.8 (ref 1.2–2.2)
Albumin: 4.2 g/dL (ref 3.6–4.8)
BILIRUBIN TOTAL: 0.3 mg/dL (ref 0.0–1.2)
BUN/Creatinine Ratio: 23 (ref 12–28)
BUN: 28 mg/dL — AB (ref 8–27)
CHLORIDE: 102 mmol/L (ref 96–106)
CO2: 22 mmol/L (ref 18–29)
CREATININE: 1.2 mg/dL — AB (ref 0.57–1.00)
Calcium: 9.5 mg/dL (ref 8.7–10.3)
GFR calc Af Amer: 54 mL/min/{1.73_m2} — ABNORMAL LOW (ref >59)
GFR calc non Af Amer: 47 mL/min/{1.73_m2} — ABNORMAL LOW (ref >59)
GLUCOSE: 127 mg/dL — AB (ref 65–99)
Globulin, Total: 2.4 g/dL (ref 1.5–4.5)
Potassium: 4.7 mmol/L (ref 3.5–5.2)
Sodium: 139 mmol/L (ref 134–144)
Total Protein: 6.6 g/dL (ref 6.0–8.5)

## 2015-10-26 NOTE — Telephone Encounter (Signed)
Please let her know that the medicine seems to be making her kidneys a little bit worse, but it also might be dehydration. I want her to drink a whole lot of water and we'll recheck it with just a lab visit in 2 weeks, otherwise everything looks normal.

## 2015-11-09 ENCOUNTER — Other Ambulatory Visit: Payer: Medicare Other

## 2015-11-09 DIAGNOSIS — N289 Disorder of kidney and ureter, unspecified: Secondary | ICD-10-CM

## 2015-11-10 LAB — BASIC METABOLIC PANEL
BUN/Creatinine Ratio: 22 (ref 12–28)
BUN: 35 mg/dL — ABNORMAL HIGH (ref 8–27)
CO2: 21 mmol/L (ref 18–29)
Calcium: 9 mg/dL (ref 8.7–10.3)
Chloride: 100 mmol/L (ref 96–106)
Creatinine, Ser: 1.57 mg/dL — ABNORMAL HIGH (ref 0.57–1.00)
GFR calc Af Amer: 39 mL/min/{1.73_m2} — ABNORMAL LOW (ref >59)
GFR, EST NON AFRICAN AMERICAN: 34 mL/min/{1.73_m2} — AB (ref >59)
Glucose: 183 mg/dL — ABNORMAL HIGH (ref 65–99)
POTASSIUM: 4.6 mmol/L (ref 3.5–5.2)
SODIUM: 137 mmol/L (ref 134–144)

## 2015-11-12 ENCOUNTER — Telehealth: Payer: Self-pay | Admitting: Family Medicine

## 2015-11-12 DIAGNOSIS — N179 Acute kidney failure, unspecified: Secondary | ICD-10-CM

## 2015-11-12 NOTE — Telephone Encounter (Signed)
Patient notified

## 2015-11-12 NOTE — Telephone Encounter (Signed)
Please have her stop her jardiance, push fluids and come back for repeat labs on Wednesday.

## 2015-11-14 ENCOUNTER — Other Ambulatory Visit: Payer: Medicare Other

## 2015-11-14 DIAGNOSIS — N179 Acute kidney failure, unspecified: Secondary | ICD-10-CM

## 2015-11-15 ENCOUNTER — Telehealth: Payer: Self-pay | Admitting: Family Medicine

## 2015-11-15 LAB — BASIC METABOLIC PANEL
BUN / CREAT RATIO: 23 (ref 12–28)
BUN: 30 mg/dL — ABNORMAL HIGH (ref 8–27)
CO2: 20 mmol/L (ref 18–29)
CREATININE: 1.31 mg/dL — AB (ref 0.57–1.00)
Calcium: 9.1 mg/dL (ref 8.7–10.3)
Chloride: 101 mmol/L (ref 96–106)
GFR, EST AFRICAN AMERICAN: 48 mL/min/{1.73_m2} — AB (ref >59)
GFR, EST NON AFRICAN AMERICAN: 42 mL/min/{1.73_m2} — AB (ref >59)
Glucose: 153 mg/dL — ABNORMAL HIGH (ref 65–99)
Potassium: 4.6 mmol/L (ref 3.5–5.2)
SODIUM: 137 mmol/L (ref 134–144)

## 2015-11-15 NOTE — Telephone Encounter (Signed)
Please let her know that her kidneys are already doing better off the medicine. We'll check them again next time. Stay off that jardiance though. Thanks!

## 2015-11-15 NOTE — Telephone Encounter (Signed)
Patient notified

## 2015-11-21 ENCOUNTER — Other Ambulatory Visit: Payer: Self-pay | Admitting: Family Medicine

## 2015-11-28 ENCOUNTER — Ambulatory Visit (INDEPENDENT_AMBULATORY_CARE_PROVIDER_SITE_OTHER): Payer: Medicare Other | Admitting: Family Medicine

## 2015-11-28 ENCOUNTER — Encounter: Payer: Self-pay | Admitting: Family Medicine

## 2015-11-28 VITALS — BP 117/78 | HR 79 | Temp 98.8°F | Wt 199.0 lb

## 2015-11-28 DIAGNOSIS — N182 Chronic kidney disease, stage 2 (mild): Secondary | ICD-10-CM | POA: Diagnosis not present

## 2015-11-28 DIAGNOSIS — Z23 Encounter for immunization: Secondary | ICD-10-CM | POA: Diagnosis not present

## 2015-11-28 DIAGNOSIS — N179 Acute kidney failure, unspecified: Secondary | ICD-10-CM

## 2015-11-28 DIAGNOSIS — E1122 Type 2 diabetes mellitus with diabetic chronic kidney disease: Secondary | ICD-10-CM

## 2015-11-28 DIAGNOSIS — I129 Hypertensive chronic kidney disease with stage 1 through stage 4 chronic kidney disease, or unspecified chronic kidney disease: Secondary | ICD-10-CM | POA: Diagnosis not present

## 2015-11-28 DIAGNOSIS — Z1239 Encounter for other screening for malignant neoplasm of breast: Secondary | ICD-10-CM

## 2015-11-28 LAB — BAYER DCA HB A1C WAIVED: HB A1C (BAYER DCA - WAIVED): 7.5 % — ABNORMAL HIGH (ref <7.0)

## 2015-11-28 LAB — HEMOGLOBIN A1C: HEMOGLOBIN A1C: 7.5

## 2015-11-28 MED ORDER — BUPROPION HCL ER (SR) 150 MG PO TB12: 150.0000 mg | ORAL_TABLET | Freq: Two times a day (BID) | ORAL | 1 refills | Status: DC

## 2015-11-28 MED ORDER — GLIPIZIDE 5 MG PO TABS: ORAL_TABLET | ORAL | 1 refills | Status: DC

## 2015-11-28 MED ORDER — FERROUS SULFATE 325 (65 FE) MG PO TABS: 325.0000 mg | ORAL_TABLET | Freq: Two times a day (BID) | ORAL | 1 refills | Status: DC

## 2015-11-28 MED ORDER — LISINOPRIL-HYDROCHLOROTHIAZIDE 20-25 MG PO TABS: 2.0000 | ORAL_TABLET | Freq: Every day | ORAL | 1 refills | Status: DC

## 2015-11-28 MED ORDER — METFORMIN HCL 1000 MG PO TABS: 1000.0000 mg | ORAL_TABLET | Freq: Two times a day (BID) | ORAL | 1 refills | Status: DC

## 2015-11-28 MED ORDER — EXENATIDE ER 2 MG ~~LOC~~ PEN: 2.0000 mg | PEN_INJECTOR | SUBCUTANEOUS | 3 refills | Status: DC

## 2015-11-28 NOTE — Assessment & Plan Note (Signed)
Under good control. Continue current regimen. Recheck at follow up.

## 2015-11-28 NOTE — Patient Instructions (Addendum)

## 2015-11-28 NOTE — Progress Notes (Signed)
BP 117/78 (BP Location: Left Arm, Patient Position: Sitting, Cuff Size: Large)   Pulse 79   Temp 98.8 F (37.1 C)   Wt 199 lb (90.3 kg)   SpO2 99%   BMI 33.84 kg/m    Subjective:    Patient ID: Alison Hernandez, female    DOB: January 01, 1946, 70 y.o.   MRN: [id number]  HPI: Alison Hernandez is a 70 y.o. female  Chief Complaint  Patient presents with  . Diabetes   DIABETES Hypoglycemic episodes:no Polydipsia/polyuria: no Visual disturbance: no Chest pain: no Paresthesias: no Glucose Monitoring: yes  Accucheck frequency: Daily Taking Insulin?: no Blood Pressure Monitoring: not checking Retinal Examination: Not up to Date Foot Exam: Up to Date Diabetic Education: Completed Pneumovax: Not up to Date Influenza: Not up to Date Aspirin: yes  Relevant past medical, surgical, family and social history reviewed and updated as indicated. Interim medical history since our last visit reviewed. Allergies and medications reviewed and updated.  Review of Systems  Constitutional: Negative.   Respiratory: Negative.   Cardiovascular: Negative.   Psychiatric/Behavioral: Negative.     Per HPI unless specifically indicated above     Objective:    BP 117/78 (BP Location: Left Arm, Patient Position: Sitting, Cuff Size: Large)   Pulse 79   Temp 98.8 F (37.1 C)   Wt 199 lb (90.3 kg)   SpO2 99%   BMI 33.84 kg/m   Wt Readings from Last 3 Encounters:  11/28/15 199 lb (90.3 kg)  10/25/15 197 lb (89.4 kg)  08/29/15 203 lb (92.1 kg)    Physical Exam  Constitutional: She is oriented to person, place, and time. She appears well-developed and well-nourished. No distress.  HENT:  Head: Normocephalic and atraumatic.  Right Ear: Hearing normal.  Left Ear: Hearing normal.  Nose: Nose normal.  Eyes: Conjunctivae and lids are normal. Right eye exhibits no discharge. Left eye exhibits no discharge. No scleral icterus.  Cardiovascular: Normal rate, regular rhythm, normal heart sounds and  intact distal pulses.  Exam reveals no gallop and no friction rub.   No murmur heard. Pulmonary/Chest: Effort normal and breath sounds normal. No respiratory distress. She has no wheezes. She has no rales. She exhibits no tenderness.  Musculoskeletal: Normal range of motion.  Neurological: She is alert and oriented to person, place, and time.  Skin: Skin is warm, dry and intact. No rash noted. No erythema. No pallor.  Psychiatric: She has a normal mood and affect. Her speech is normal and behavior is normal. Judgment and thought content normal. Cognition and memory are normal.  Nursing note and vitals reviewed.   Results for orders placed or performed in visit on 11/28/15  HM MAMMOGRAPHY  Result Value Ref Range   HM Mammogram 0-4 Bi-Rad 0-4 Bi-Rad, Self Reported Normal  Hemoglobin A1c  Result Value Ref Range   Hemoglobin A1C 7.5       Assessment & Plan:   Problem List Items Addressed This Visit      Endocrine   Diabetes (HCC) - Primary    Will stop jardiance due to renal impairment. Will start bydureon. First shot given today. Monitor closely. Stop glipizide if going low.       Relevant Medications   Exenatide ER (BYDUREON) 2 MG PEN   glipiZIDE (GLUCOTROL) 5 MG tablet   lisinopril-hydrochlorothiazide (PRINZIDE,ZESTORETIC) 20-25 MG tablet   metFORMIN (GLUCOPHAGE) 1000 MG tablet   Other Relevant Orders   Comprehensive metabolic panel   Bayer DCA Hb N8G Waived  Genitourinary   Benign hypertensive renal disease    Under good control. Continue current regimen. Recheck at follow up.       Relevant Orders   Comprehensive metabolic panel    Other Visit Diagnoses    Acute renal failure, unspecified acute renal failure type (HCC)       Will recheck labs today. Off jardiance due to renal insuffiency.   Relevant Orders   Comprehensive metabolic panel   Screening for breast cancer       Mammogram ordered today.   Relevant Orders   MM DIGITAL SCREENING BILATERAL    Immunization due       Flu shot given today.   Relevant Orders   Flu vaccine HIGH DOSE PF (Fluzone High dose) (Completed)       Follow up plan: No Follow-up on file.

## 2015-11-28 NOTE — Assessment & Plan Note (Signed)
Will stop jardiance due to renal impairment. Will start bydureon. First shot given today. Monitor closely. Stop glipizide if going low.

## 2015-11-29 ENCOUNTER — Telehealth: Payer: Self-pay | Admitting: Family Medicine

## 2015-11-29 LAB — COMPREHENSIVE METABOLIC PANEL
ALBUMIN: 4.1 g/dL (ref 3.6–4.8)
ALK PHOS: 91 IU/L (ref 39–117)
ALT: 11 IU/L (ref 0–32)
AST: 19 IU/L (ref 0–40)
Albumin/Globulin Ratio: 1.5 (ref 1.2–2.2)
BUN/Creatinine Ratio: 19 (ref 12–28)
BUN: 21 mg/dL (ref 8–27)
Bilirubin Total: 0.2 mg/dL (ref 0.0–1.2)
CHLORIDE: 100 mmol/L (ref 96–106)
CO2: 21 mmol/L (ref 18–29)
CREATININE: 1.11 mg/dL — AB (ref 0.57–1.00)
Calcium: 9.2 mg/dL (ref 8.7–10.3)
GFR calc non Af Amer: 51 mL/min/{1.73_m2} — ABNORMAL LOW (ref >59)
GFR, EST AFRICAN AMERICAN: 59 mL/min/{1.73_m2} — AB (ref >59)
GLUCOSE: 180 mg/dL — AB (ref 65–99)
Globulin, Total: 2.8 g/dL (ref 1.5–4.5)
POTASSIUM: 4.5 mmol/L (ref 3.5–5.2)
SODIUM: 138 mmol/L (ref 134–144)
TOTAL PROTEIN: 6.9 g/dL (ref 6.0–8.5)

## 2015-11-29 NOTE — Telephone Encounter (Signed)
It was on her neck and chest, there are only some red spots there now, the itching stopped.

## 2015-11-29 NOTE — Telephone Encounter (Signed)
It could be- is it just on her neck or also on her belly and back? More likely if it's everywhere. Have her stop by and we'll take a quick look at it.

## 2015-11-29 NOTE — Telephone Encounter (Signed)
Possibly. If it happens next week, we will not continue her on that medicine.

## 2015-11-29 NOTE — Telephone Encounter (Signed)
Patient notified.   Patient broke out with a rash on her neck and chest, daughter gave her benadryl, which helped. Patient still has red spots today. Could this be an allergic reaction to the new medication yesterday?

## 2015-11-29 NOTE — Telephone Encounter (Signed)
Patient notified

## 2015-11-29 NOTE — Telephone Encounter (Signed)
Please let her know that her kidney function is almost back to normal, but not quite yet. We'll check it again next time, but it's much better. OK to tell her daughter- HIPPA signed yesterday

## 2015-12-07 ENCOUNTER — Telehealth: Payer: Self-pay | Admitting: Family Medicine

## 2015-12-07 ENCOUNTER — Ambulatory Visit: Payer: Medicare Other | Admitting: Family Medicine

## 2015-12-07 NOTE — Telephone Encounter (Signed)
Pt's daughter called stated pt has a rash all over her chest and arms. Stated she feels its a reaction to the shot like she had previously. Please call pt's daughter a call back ASAP. Thanks.

## 2015-12-07 NOTE — Telephone Encounter (Signed)
Patient is unable to come in today, she will call and schedule an appointment for next week.

## 2015-12-07 NOTE — Telephone Encounter (Signed)
Can she come in?

## 2015-12-19 ENCOUNTER — Ambulatory Visit (INDEPENDENT_AMBULATORY_CARE_PROVIDER_SITE_OTHER): Payer: Medicare Other | Admitting: Family Medicine

## 2015-12-19 ENCOUNTER — Encounter: Payer: Self-pay | Admitting: Family Medicine

## 2015-12-19 VITALS — BP 122/73 | HR 83 | Temp 98.8°F | Wt 193.0 lb

## 2015-12-19 DIAGNOSIS — R110 Nausea: Secondary | ICD-10-CM

## 2015-12-19 MED ORDER — MECLIZINE HCL 25 MG PO TABS: 25.0000 mg | ORAL_TABLET | Freq: Three times a day (TID) | ORAL | 0 refills | Status: DC | PRN

## 2015-12-19 MED ORDER — OMEPRAZOLE 20 MG PO CPDR: 20.0000 mg | DELAYED_RELEASE_CAPSULE | Freq: Every day | ORAL | 3 refills | Status: DC

## 2015-12-19 MED ORDER — ONDANSETRON 4 MG PO TBDP: 4.0000 mg | ORAL_TABLET | Freq: Three times a day (TID) | ORAL | 0 refills | Status: DC | PRN

## 2015-12-19 NOTE — Patient Instructions (Signed)
Follow up if no better by Friday or Monday

## 2015-12-19 NOTE — Progress Notes (Signed)
BP 122/73   Pulse 83   Temp 98.8 F (37.1 C)   Wt 193 lb (87.5 kg)   SpO2 98%   BMI 32.82 kg/m    Subjective:    Patient ID: Alison Hernandez, female    DOB: 08-19-1945, 70 y.o.   MRN: [id number]  HPI: Alison Hazyancy J Norberto is a 70 y.o. female  Chief Complaint  Patient presents with  . Nausea    started Sunday with a cough, Monday she was throwing up. Yest and today she's felt nauseated and dizzy. Some loose bowels today. No fever, no other URI symptoms, no abdominal pain.    Patient presents with 4 day history of nausea and dizziness. Has had intermittent cough, one episode of vomiting, and one episode of diarrhea. The dizziness is more of a "woozy" feeling than a room spinning or fainting feeling. Denies fever, chills, aches, abdominal pain, or urinary symptoms. Has tried emitrol over the counter with no nausea relief. States she is keeping food and water down fine. The dizziness and nausea are the most bothersome things for her.   Relevant past medical, surgical, family and social history reviewed and updated as indicated. Interim medical history since our last visit reviewed. Allergies and medications reviewed and updated.  Review of Systems  Constitutional: Negative.   HENT: Negative.   Eyes: Negative.   Respiratory: Positive for cough.   Gastrointestinal: Positive for diarrhea, nausea and vomiting.  Genitourinary: Negative.   Musculoskeletal: Negative.   Skin: Negative.   Neurological: Positive for dizziness and light-headedness.  Psychiatric/Behavioral: Negative.     Per HPI unless specifically indicated above     Objective:    BP 122/73   Pulse 83   Temp 98.8 F (37.1 C)   Wt 193 lb (87.5 kg)   SpO2 98%   BMI 32.82 kg/m   Wt Readings from Last 3 Encounters:  12/19/15 193 lb (87.5 kg)  11/28/15 199 lb (90.3 kg)  10/25/15 197 lb (89.4 kg)    Physical Exam  Constitutional: She is oriented to person, place, and time. She appears well-developed and  well-nourished. No distress.  HENT:  Head: Atraumatic.  Right Ear: External ear normal.  Left Ear: External ear normal.  Nose: Nose normal.  Mouth/Throat: Oropharynx is clear and moist. No oropharyngeal exudate.  Eyes: Conjunctivae are normal. Pupils are equal, round, and reactive to light. No scleral icterus.  Neck: Normal range of motion. Neck supple.  Cardiovascular: Normal rate, regular rhythm and normal heart sounds.   Pulmonary/Chest: Effort normal and breath sounds normal. No respiratory distress.  Abdominal: Soft. Bowel sounds are normal. She exhibits no distension. There is no tenderness.  Musculoskeletal: Normal range of motion.  Lymphadenopathy:    She has no cervical adenopathy.  Neurological: She is alert and oriented to person, place, and time.  Skin: Skin is warm and dry.  Psychiatric: She has a normal mood and affect. Her behavior is normal.  Nursing note and vitals reviewed.      Assessment & Plan:   Problem List Items Addressed This Visit    None    Visit Diagnoses    Nausea    -  Primary   Zofran, prilosec, and meclizine sent to treat symptoms. Stay well hydrated and eat bland diet, advance as tolerated.      Likely a viral illness, can take some imodium if diarrhea continues or gets worse. Call if no improvement in the next few days.   Follow up plan: Return  if symptoms worsen or fail to improve.

## 2015-12-26 ENCOUNTER — Encounter: Payer: Self-pay | Admitting: Family Medicine

## 2015-12-26 ENCOUNTER — Ambulatory Visit (INDEPENDENT_AMBULATORY_CARE_PROVIDER_SITE_OTHER): Payer: Medicare Other | Admitting: Family Medicine

## 2015-12-26 VITALS — BP 119/72 | HR 76 | Temp 98.8°F | Wt 198.1 lb

## 2015-12-26 DIAGNOSIS — E1122 Type 2 diabetes mellitus with diabetic chronic kidney disease: Secondary | ICD-10-CM

## 2015-12-26 DIAGNOSIS — N182 Chronic kidney disease, stage 2 (mild): Secondary | ICD-10-CM | POA: Diagnosis not present

## 2015-12-26 MED ORDER — SITAGLIPTIN PHOSPHATE 100 MG PO TABS: 100.0000 mg | ORAL_TABLET | Freq: Every day | ORAL | 3 refills | Status: DC

## 2015-12-26 NOTE — Assessment & Plan Note (Signed)
Could not tolerate bydureon. Will start [place]. Recheck with A1c in 2 months. Call with concerns.

## 2015-12-26 NOTE — Progress Notes (Signed)
BP 119/72 (BP Location: Left Arm, Patient Position: Sitting, Cuff Size: Large)   Pulse 76   Temp 98.8 F (37.1 C)   Wt 198 lb 1.6 oz (89.9 kg)   SpO2 99%   BMI 33.69 kg/m    Subjective:    Patient ID: Alison Hernandez, female    DOB: [birth year], 70 y.o.   MRN: [id number]  HPI: Alison Hernandez is a 70 y.o. female  Chief Complaint  Patient presents with  . Diabetes   DIABETES- couldn't tolerate the bydureon, had a terrible rash. Nausea is gone.  Hypoglycemic episodes:no Polydipsia/polyuria: no Visual disturbance: no Chest pain: no Paresthesias: no Glucose Monitoring: no  Accucheck frequency: Not Checking Taking Insulin?: no Blood Pressure Monitoring: not checking Retinal Examination: Not up to Date Foot Exam: Up to Date Diabetic Education: Completed Pneumovax: Up to Date Influenza: Up to Date Aspirin: yes  Relevant past medical, surgical, family and social history reviewed and updated as indicated. Interim medical history since our last visit reviewed. Allergies and medications reviewed and updated.  Review of Systems  Constitutional: Negative.   Respiratory: Negative.   Cardiovascular: Negative.   Psychiatric/Behavioral: Negative.     Per HPI unless specifically indicated above     Objective:    BP 119/72 (BP Location: Left Arm, Patient Position: Sitting, Cuff Size: Large)   Pulse 76   Temp 98.8 F (37.1 C)   Wt 198 lb 1.6 oz (89.9 kg)   SpO2 99%   BMI 33.69 kg/m   Wt Readings from Last 3 Encounters:  12/26/15 198 lb 1.6 oz (89.9 kg)  12/19/15 193 lb (87.5 kg)  11/28/15 199 lb (90.3 kg)    Physical Exam  Constitutional: She is oriented to person, place, and time. She appears well-developed and well-nourished. No distress.  HENT:  Head: Normocephalic and atraumatic.  Right Ear: Hearing normal.  Left Ear: Hearing normal.  Nose: Nose normal.  Eyes: Conjunctivae and lids are normal. Right eye exhibits no discharge. Left eye exhibits no discharge. No  scleral icterus.  Cardiovascular: Normal rate, regular rhythm, normal heart sounds and intact distal pulses.  Exam reveals no gallop and no friction rub.   No murmur heard. Pulmonary/Chest: Effort normal and breath sounds normal. No respiratory distress. She has no wheezes. She has no rales. She exhibits no tenderness.  Musculoskeletal: Normal range of motion.  Neurological: She is alert and oriented to person, place, and time.  Skin: Skin is warm, dry and intact. No rash noted. No erythema. No pallor.  Psychiatric: She has a normal mood and affect. Her speech is normal and behavior is normal. Judgment and thought content normal. Cognition and memory are normal.  Nursing note and vitals reviewed.   Results for orders placed or performed in visit on 11/28/15  HM MAMMOGRAPHY  Result Value Ref Range   HM Mammogram 0-4 Bi-Rad 0-4 Bi-Rad, Self Reported Normal  Comprehensive metabolic panel  Result Value Ref Range   Glucose 180 (H) 65 - 99 mg/dL   BUN 21 8 - 27 mg/dL   Creatinine, Ser 4.09 (H) 0.57 - 1.00 mg/dL   GFR calc non Af Amer 51 (L) >59 mL/min/1.73   GFR calc Af Amer 59 (L) >59 mL/min/1.73   BUN/Creatinine Ratio 19 12 - 28   Sodium 138 134 - 144 mmol/L   Potassium 4.5 3.5 - 5.2 mmol/L   Chloride 100 96 - 106 mmol/L   CO2 21 18 - 29 mmol/L   Calcium 9.2 8.7 -  10.3 mg/dL   Total Protein 6.9 6.0 - 8.5 g/dL   Albumin 4.1 3.6 - 4.8 g/dL   Globulin, Total 2.8 1.5 - 4.5 g/dL   Albumin/Globulin Ratio 1.5 1.2 - 2.2   Bilirubin Total 0.2 0.0 - 1.2 mg/dL   Alkaline Phosphatase 91 39 - 117 IU/L   AST 19 0 - 40 IU/L   ALT 11 0 - 32 IU/L  Bayer DCA Hb A1c Waived  Result Value Ref Range   Bayer DCA Hb A1c Waived 7.5 (H) <7.0 %  Hemoglobin A1c  Result Value Ref Range   Hemoglobin A1C 7.5       Assessment & Plan:   Problem List Items Addressed This Visit      Endocrine   Diabetes (HCC) - Primary    Could not tolerate bydureon. Will start [place]. Recheck with A1c in 2 months. Call  with concerns.       Relevant Medications   sitaGLIPtin (JANUVIA) 100 MG tablet    Other Visit Diagnoses   None.      Follow up plan: Return in about 2 months (around 02/25/2016) for DM visit with A1c.

## 2016-02-01 ENCOUNTER — Ambulatory Visit (INDEPENDENT_AMBULATORY_CARE_PROVIDER_SITE_OTHER): Payer: Medicare Other | Admitting: Family Medicine

## 2016-02-01 ENCOUNTER — Encounter: Payer: Self-pay | Admitting: Family Medicine

## 2016-02-01 VITALS — BP 157/81 | HR 79 | Temp 98.5°F | Wt 200.3 lb

## 2016-02-01 DIAGNOSIS — R609 Edema, unspecified: Secondary | ICD-10-CM | POA: Diagnosis not present

## 2016-02-01 DIAGNOSIS — I129 Hypertensive chronic kidney disease with stage 1 through stage 4 chronic kidney disease, or unspecified chronic kidney disease: Secondary | ICD-10-CM | POA: Diagnosis not present

## 2016-02-01 MED ORDER — ONDANSETRON 4 MG PO TBDP: 4.0000 mg | ORAL_TABLET | Freq: Three times a day (TID) | ORAL | 3 refills | Status: DC | PRN

## 2016-02-01 MED ORDER — FUROSEMIDE 20 MG PO TABS: 20.0000 mg | ORAL_TABLET | Freq: Every day | ORAL | 3 refills | Status: DC

## 2016-02-01 NOTE — Progress Notes (Signed)
BP (!) 157/81 (BP Location: Left Arm, Patient Position: Sitting, Cuff Size: Large)   Pulse 79   Temp 98.5 F (36.9 C)   Wt 200 lb 4.8 oz (90.9 kg)   SpO2 99%   BMI 34.06 kg/m    Subjective:    Patient ID: Alison Hernandez, female    DOB: [birth year], 70 y.o.   MRN: [id number]  HPI: Alison Hazyancy J Moline is a 70 y.o. female  Chief Complaint  Patient presents with  . Edema  . Nausea    Patient would like a refill on her Zofran   Has been having swelling in her ankles since Monday. Sugars are a little high in the AM Hurting around her ankles. Hasn't been able to wear her compression stockings because she can't get them on. No SOB, No redness, no heat, No fevers. She has otherwise been doing well with no other concerns or complaints at this time. She thinks it has been worse since she had to come off her jardiance due to her renal function. She did not take her BP medicine today.  Relevant past medical, surgical, family and social history reviewed and updated as indicated. Interim medical history since our last visit reviewed. Allergies and medications reviewed and updated.  Review of Systems  Constitutional: Negative.   Respiratory: Negative.   Cardiovascular: Positive for leg swelling. Negative for chest pain and palpitations.  Psychiatric/Behavioral: Negative.     Per HPI unless specifically indicated above     Objective:    BP (!) 157/81 (BP Location: Left Arm, Patient Position: Sitting, Cuff Size: Large)   Pulse 79   Temp 98.5 F (36.9 C)   Wt 200 lb 4.8 oz (90.9 kg)   SpO2 99%   BMI 34.06 kg/m   Wt Readings from Last 3 Encounters:  02/01/16 200 lb 4.8 oz (90.9 kg)  12/26/15 198 lb 1.6 oz (89.9 kg)  12/19/15 193 lb (87.5 kg)    Physical Exam  Constitutional: She is oriented to person, place, and time. She appears well-developed and well-nourished. No distress.  HENT:  Head: Normocephalic and atraumatic.  Right Ear: Hearing normal.  Left Ear: Hearing normal.  Nose:  Nose normal.  Eyes: Conjunctivae and lids are normal. Right eye exhibits no discharge. Left eye exhibits no discharge. No scleral icterus.  Cardiovascular: Normal rate, regular rhythm, normal heart sounds and intact distal pulses.  Exam reveals no gallop and no friction rub.   No murmur heard. Pulmonary/Chest: Effort normal and breath sounds normal. No respiratory distress. She has no wheezes. She has no rales. She exhibits no tenderness.  Musculoskeletal: Normal range of motion. She exhibits edema (2+ edema bilaterally ).  Neurological: She is alert and oriented to person, place, and time.  Skin: Skin is warm, dry and intact. No rash noted. No erythema. No pallor.  Psychiatric: She has a normal mood and affect. Her speech is normal and behavior is normal. Judgment and thought content normal. Cognition and memory are normal.  Nursing note and vitals reviewed.   Results for orders placed or performed in visit on 11/28/15  HM MAMMOGRAPHY  Result Value Ref Range   HM Mammogram 0-4 Bi-Rad 0-4 Bi-Rad, Self Reported Normal  Comprehensive metabolic panel  Result Value Ref Range   Glucose 180 (H) 65 - 99 mg/dL   BUN 21 8 - 27 mg/dL   Creatinine, Ser 4.091.11 (H) 0.57 - 1.00 mg/dL   GFR calc non Af Amer 51 (L) >59 mL/min/1.73   GFR  calc Af Amer 59 (L) >59 mL/min/1.73   BUN/Creatinine Ratio 19 12 - 28   Sodium 138 134 - 144 mmol/L   Potassium 4.5 3.5 - 5.2 mmol/L   Chloride 100 96 - 106 mmol/L   CO2 21 18 - 29 mmol/L   Calcium 9.2 8.7 - 10.3 mg/dL   Total Protein 6.9 6.0 - 8.5 g/dL   Albumin 4.1 3.6 - 4.8 g/dL   Globulin, Total 2.8 1.5 - 4.5 g/dL   Albumin/Globulin Ratio 1.5 1.2 - 2.2   Bilirubin Total 0.2 0.0 - 1.2 mg/dL   Alkaline Phosphatase 91 39 - 117 IU/L   AST 19 0 - 40 IU/L   ALT 11 0 - 32 IU/L  Bayer DCA Hb A1c Waived  Result Value Ref Range   Bayer DCA Hb A1c Waived 7.5 (H) <7.0 %  Hemoglobin A1c  Result Value Ref Range   Hemoglobin A1C 7.5       Assessment & Plan:    Problem List Items Addressed This Visit      Genitourinary   Benign hypertensive renal disease    Did not take her medicine today. Encouraged her to take it. Recheck at follow up.        Other Visit Diagnoses    Peripheral edema    -  Primary   Will do 5 days of lasix. Checking labs. Compression stockings and elevation. Call with any concerns. Recheck 2 weeks.    Relevant Orders   Comprehensive metabolic panel   B Nat Peptide       Follow up plan: Return 2 weeks , for Follow up swelling.

## 2016-02-01 NOTE — Assessment & Plan Note (Signed)
Did not take her medicine today. Encouraged her to take it. Recheck at follow up.

## 2016-02-02 LAB — COMPREHENSIVE METABOLIC PANEL
A/G RATIO: 1.7 (ref 1.2–2.2)
ALBUMIN: 4 g/dL (ref 3.6–4.8)
ALT: 12 IU/L (ref 0–32)
AST: 18 IU/L (ref 0–40)
Alkaline Phosphatase: 86 IU/L (ref 39–117)
BILIRUBIN TOTAL: 0.3 mg/dL (ref 0.0–1.2)
BUN / CREAT RATIO: 19 (ref 12–28)
BUN: 20 mg/dL (ref 8–27)
CHLORIDE: 97 mmol/L (ref 96–106)
CO2: 23 mmol/L (ref 18–29)
Calcium: 9 mg/dL (ref 8.7–10.3)
Creatinine, Ser: 1.07 mg/dL — ABNORMAL HIGH (ref 0.57–1.00)
GFR calc non Af Amer: 53 mL/min/{1.73_m2} — ABNORMAL LOW (ref >59)
GFR, EST AFRICAN AMERICAN: 62 mL/min/{1.73_m2} (ref >59)
GLOBULIN, TOTAL: 2.3 g/dL (ref 1.5–4.5)
Glucose: 133 mg/dL — ABNORMAL HIGH (ref 65–99)
POTASSIUM: 4.5 mmol/L (ref 3.5–5.2)
SODIUM: 138 mmol/L (ref 134–144)
Total Protein: 6.3 g/dL (ref 6.0–8.5)

## 2016-02-04 ENCOUNTER — Encounter: Payer: Self-pay | Admitting: Family Medicine

## 2016-02-07 LAB — BRAIN NATRIURETIC PEPTIDE

## 2016-02-13 ENCOUNTER — Ambulatory Visit: Payer: Medicare Other | Admitting: Family Medicine

## 2016-02-27 ENCOUNTER — Ambulatory Visit: Payer: Medicare Other | Admitting: Family Medicine

## 2016-03-05 ENCOUNTER — Encounter: Payer: Self-pay | Admitting: Family Medicine

## 2016-03-05 ENCOUNTER — Ambulatory Visit (INDEPENDENT_AMBULATORY_CARE_PROVIDER_SITE_OTHER): Payer: Medicare Other | Admitting: Family Medicine

## 2016-03-05 VITALS — BP 157/77 | HR 74 | Temp 98.9°F | Wt 198.6 lb

## 2016-03-05 DIAGNOSIS — I129 Hypertensive chronic kidney disease with stage 1 through stage 4 chronic kidney disease, or unspecified chronic kidney disease: Secondary | ICD-10-CM | POA: Diagnosis not present

## 2016-03-05 DIAGNOSIS — R609 Edema, unspecified: Secondary | ICD-10-CM | POA: Diagnosis not present

## 2016-03-05 MED ORDER — NYSTATIN 100000 UNIT/GM EX POWD: Freq: Four times a day (QID) | CUTANEOUS | 0 refills | Status: DC

## 2016-03-05 NOTE — Progress Notes (Signed)
BP (!) 157/77 (BP Location: Left Arm, Patient Position: Sitting, Cuff Size: Large)   Pulse 74   Temp 98.9 F (37.2 C)   Wt 198 lb 9.6 oz (90.1 kg)   SpO2 100%   BMI 33.77 kg/m    Subjective:    Patient ID: Alison Hernandez, female    DOB: 10-Dec-1945, 70 y.o.   MRN: [id number]  HPI: Alison Hernandez is a 70 y.o. female  Chief Complaint  Patient presents with  . Edema   Lasix only decreased the swelling very slightly. Didn't really help. She has not been able to wear her stockings because they don't fit. Still worse at the end of the day and better at the beginning. She is very anxious about her kidneys and wants to make sure they are OK. She notes that she is feeling really cold all the time. She has not had any fevers, chills, night sweats. No heat or redness on her legs. She is otherwise feeling well with no other concerns or complaints at this time.   Relevant past medical, surgical, family and social history reviewed and updated as indicated. Interim medical history since our last visit reviewed. Allergies and medications reviewed and updated.  Review of Systems  Constitutional: Negative.   Respiratory: Negative.   Cardiovascular: Positive for leg swelling. Negative for chest pain and palpitations.  Psychiatric/Behavioral: Negative.     Per HPI unless specifically indicated above     Objective:    BP (!) 157/77 (BP Location: Left Arm, Patient Position: Sitting, Cuff Size: Large)   Pulse 74   Temp 98.9 F (37.2 C)   Wt 198 lb 9.6 oz (90.1 kg)   SpO2 100%   BMI 33.77 kg/m   Wt Readings from Last 3 Encounters:  03/05/16 198 lb 9.6 oz (90.1 kg)  02/01/16 200 lb 4.8 oz (90.9 kg)  12/26/15 198 lb 1.6 oz (89.9 kg)    Physical Exam  Constitutional: She is oriented to person, place, and time. She appears well-developed and well-nourished. No distress.  HENT:  Head: Normocephalic and atraumatic.  Right Ear: Hearing normal.  Left Ear: Hearing normal.  Nose: Nose  normal.  Eyes: Conjunctivae and lids are normal. Right eye exhibits no discharge. Left eye exhibits no discharge. No scleral icterus.  Cardiovascular: Normal rate, regular rhythm, normal heart sounds and intact distal pulses.  Exam reveals no gallop and no friction rub.   No murmur heard. Pulmonary/Chest: Effort normal and breath sounds normal. No respiratory distress. She has no wheezes. She has no rales. She exhibits no tenderness.  Musculoskeletal: Normal range of motion.  Neurological: She is alert and oriented to person, place, and time.  Skin: Skin is warm, dry and intact. Rash (red, angry shiny rash under her breasts) noted. No erythema. No pallor.  Psychiatric: She has a normal mood and affect. Her speech is normal and behavior is normal. Judgment and thought content normal. Cognition and memory are normal.  Nursing note and vitals reviewed.   Results for orders placed or performed in visit on 02/01/16  Comprehensive metabolic panel  Result Value Ref Range   Glucose 133 (H) 65 - 99 mg/dL   BUN 20 8 - 27 mg/dL   Creatinine, Ser 2.131.07 (H) 0.57 - 1.00 mg/dL   GFR calc non Af Amer 53 (L) >59 mL/min/1.73   GFR calc Af Amer 62 >59 mL/min/1.73   BUN/Creatinine Ratio 19 12 - 28   Sodium 138 134 - 144 mmol/L  Potassium 4.5 3.5 - 5.2 mmol/L   Chloride 97 96 - 106 mmol/L   CO2 23 18 - 29 mmol/L   Calcium 9.0 8.7 - 10.3 mg/dL   Total Protein 6.3 6.0 - 8.5 g/dL   Albumin 4.0 3.6 - 4.8 g/dL   Globulin, Total 2.3 1.5 - 4.5 g/dL   Albumin/Globulin Ratio 1.7 1.2 - 2.2   Bilirubin Total 0.3 0.0 - 1.2 mg/dL   Alkaline Phosphatase 86 39 - 117 IU/L   AST 18 0 - 40 IU/L   ALT 12 0 - 32 IU/L  B Nat Peptide  Result Value Ref Range   BNP CANCELED pg/mL      Assessment & Plan:   Problem List Items Addressed This Visit      Genitourinary   Benign hypertensive renal disease    Not under good control. She is very anxious right now. DASH diet. Stress management. Recheck 1 month.          Other Visit Diagnoses    Peripheral edema    -  Primary   No better on lasix. Can't get stockings on. Will obtain ECHO and will get her into vascular for evaluation. Referral generated today.   Relevant Orders   Basic metabolic panel   ECHOCARDIOGRAM COMPLETE   Ambulatory referral to Vascular Surgery   TSH       Follow up plan: Return in about 4 weeks (around 04/02/2016) for Follow up BP and swelling.

## 2016-03-05 NOTE — Assessment & Plan Note (Signed)
Not under good control. She is very anxious right now. DASH diet. Stress management. Recheck 1 month.

## 2016-03-06 LAB — BASIC METABOLIC PANEL
BUN / CREAT RATIO: 17 (ref 12–28)
BUN: 17 mg/dL (ref 8–27)
CHLORIDE: 99 mmol/L (ref 96–106)
CO2: 24 mmol/L (ref 18–29)
Calcium: 10 mg/dL (ref 8.7–10.3)
Creatinine, Ser: 1.01 mg/dL — ABNORMAL HIGH (ref 0.57–1.00)
GFR calc Af Amer: 66 mL/min/{1.73_m2} (ref >59)
GFR calc non Af Amer: 57 mL/min/{1.73_m2} — ABNORMAL LOW (ref >59)
GLUCOSE: 125 mg/dL — AB (ref 65–99)
Potassium: 4.1 mmol/L (ref 3.5–5.2)
SODIUM: 137 mmol/L (ref 134–144)

## 2016-03-06 LAB — TSH: TSH: 2.54 u[IU]/mL (ref 0.450–4.500)

## 2016-03-26 ENCOUNTER — Ambulatory Visit (INDEPENDENT_AMBULATORY_CARE_PROVIDER_SITE_OTHER): Payer: Medicare Other | Admitting: Vascular Surgery

## 2016-03-26 ENCOUNTER — Encounter (INDEPENDENT_AMBULATORY_CARE_PROVIDER_SITE_OTHER): Payer: Self-pay | Admitting: Vascular Surgery

## 2016-03-26 VITALS — BP 178/73 | HR 90 | Resp 16 | Ht 66.0 in | Wt 201.0 lb

## 2016-03-26 DIAGNOSIS — M7989 Other specified soft tissue disorders: Secondary | ICD-10-CM

## 2016-03-26 DIAGNOSIS — E1122 Type 2 diabetes mellitus with diabetic chronic kidney disease: Secondary | ICD-10-CM | POA: Diagnosis not present

## 2016-03-26 DIAGNOSIS — E785 Hyperlipidemia, unspecified: Secondary | ICD-10-CM | POA: Diagnosis not present

## 2016-03-26 DIAGNOSIS — N182 Chronic kidney disease, stage 2 (mild): Secondary | ICD-10-CM

## 2016-04-02 ENCOUNTER — Ambulatory Visit (INDEPENDENT_AMBULATORY_CARE_PROVIDER_SITE_OTHER): Payer: Medicare Other | Admitting: Vascular Surgery

## 2016-04-02 VITALS — BP 150/79 | HR 93 | Resp 16 | Ht 66.0 in | Wt 200.0 lb

## 2016-04-02 DIAGNOSIS — M7989 Other specified soft tissue disorders: Secondary | ICD-10-CM | POA: Diagnosis not present

## 2016-04-02 NOTE — Progress Notes (Signed)
History of Present Illness  There is no documented history at this time  Assessments & Plan   Bilateral Lower Extremity Edema                          Additional instructions             Subjective:     Patient presents with venous ulcer of the bilateral lower extremity.                          Procedure:     3 layer unna wrap was placed bilateral lower extremity.              Plan:               Follow up in one week.

## 2016-04-04 ENCOUNTER — Ambulatory Visit
Admission: RE | Admit: 2016-04-04 | Discharge: 2016-04-04 | Disposition: A | Discharge status: Home / Self Care | Payer: Medicare Other | Source: Ambulatory Visit | Attending: Family Medicine | Admitting: Family Medicine

## 2016-04-04 DIAGNOSIS — R609 Edema, unspecified: Secondary | ICD-10-CM | POA: Insufficient documentation

## 2016-04-04 DIAGNOSIS — I10 Essential (primary) hypertension: Secondary | ICD-10-CM | POA: Diagnosis not present

## 2016-04-04 DIAGNOSIS — I5021 Acute systolic (congestive) heart failure: Secondary | ICD-10-CM | POA: Diagnosis not present

## 2016-04-04 DIAGNOSIS — R600 Localized edema: Secondary | ICD-10-CM | POA: Diagnosis not present

## 2016-04-04 NOTE — Progress Notes (Signed)
*  PRELIMINARY RESULTS* Echocardiogram 2D Echocardiogram has been performed.  Alison Hernandez, [name] 04/04/2016, 10:27 AM

## 2016-04-04 NOTE — Progress Notes (Signed)
Subjective:    Patient ID: Alison Hernandez, female    DOB: 09-Nov-1945, 71 y.o.   MRN: [id number] Chief Complaint  Patient presents with  . New Patient (Initial Visit)   however have noticeably worsened over the last two months. Swelling is worse at the end of the day. The swelling is associated with some discomfort, which she describes as a "heaviess". She denies any surgery or trauma to her lower extremity. She does not wear compression stockings or elevate her legs. She states the presence of spider veins on her bilateral lower extremity. Denies any history of ulcer development.    Review of Systems  Constitutional: Negative.   HENT: Negative.   Eyes: Negative.   Respiratory: Negative.   Cardiovascular: Positive for leg swelling (Bilateral).       Lower Extremity Pain  Gastrointestinal: Negative.   Endocrine: Negative.   Genitourinary: Negative.   Musculoskeletal: Negative.   Skin: Negative.   Allergic/Immunologic: Negative.   Neurological: Negative.   Hematological: Negative.   Psychiatric/Behavioral: Negative.       Objective:   Physical Exam  Constitutional: She is oriented to person, place, and time. She appears well-developed and well-nourished.  HENT:  Head: Normocephalic and atraumatic.  Right Ear: External ear normal.  Left Ear: External ear normal.  Eyes: Conjunctivae are normal. Pupils are equal, round, and reactive to light.  Neck: Normal range of motion.  Cardiovascular: Normal rate, regular rhythm, normal heart sounds and intact distal pulses.   Pulses:      Radial pulses are 2+ on the right side, and 2+ on the left side.       Dorsalis pedis pulses are 2+ on the right side, and 2+ on the left side.       Posterior tibial pulses are 2+ on the right side, and 2+ on the left side.  Pulmonary/Chest: Effort normal and breath sounds normal.  Abdominal: Soft. Bowel sounds are normal.  Musculoskeletal: Normal range of motion. She exhibits edema (Moderate 1+  Pitting edema. No cellulitis. Scattered spider veins. ).  Neurological: She is alert and oriented to person, place, and time.  Skin: Skin is warm and dry.  Psychiatric: She has a normal mood and affect. Her behavior is normal. Judgment and thought content normal.   BP (!) 178/73   Pulse 90   Resp 16   Ht _0  (1.676 m)   Wt 201 lb (91.2 kg)   BMI 32.44 kg/m   Past Medical History:  Diagnosis Date  . Diabetes mellitus without complication ([place])   . Hyperlipidemia   . Hypertension     Social History   Social History  . Marital status: Married    Spouse name: N/A  . Number of children: N/A  . Years of education: N/A   Occupational History  . Not on file.   Social History Main Topics  . Smoking status: Never Smoker  . Smokeless tobacco: Never Used  . Alcohol use No  . Drug use: No  . Sexual activity: Not Currently    Birth control/ protection: None   Other Topics Concern  . Not on file   Social History Narrative  . No narrative on file    Past Surgical History:  Procedure Laterality Date  . ABDOMINAL HYSTERECTOMY     total  . BREAST BIOPSY Left 20+ yrs ago   benign  . BREAST BIOPSY Left 20+ yrs ago   benign  . [place]  History  Problem Relation Age of Onset  . Diabetes Mother   . Heart disease Mother   . Cancer Father     esophagus/ lung  . Diabetes Maternal Grandmother   . Cancer Sister     Ovarian    Allergies  Allergen Reactions  . Lovastatin Nausea And Vomiting  . Bydureon [Exenatide] Rash       Assessment & Plan:  Presents as a new patient with a chief complaint of "lower extremity swelling and pain". Patient endorses a history of two months of worsening bilateral lower extremity swelling. States her legs have always been "a little swollen" however have noticeably worsened over the last two months. Swelling is worse at the end of the day. The swelling is associated with some discomfort, which she describes as a  "heaviess". She denies any surgery or trauma to her lower extremity. She does not wear compression stockings or elevate her legs. She states the presence of spider veins on her bilateral lower extremity. Denies any history of ulcer development.   1. Swelling of lower limb - New Will start bilateral unna wraps to control swelling. Once edema is controlled will start on medical grade one compression stockings. Prescription given. In addition, behavioral modification including elevation during the day will be initiated. Anti-inflammatories for pain. Will bring patient back in one month for venous duplex to rule out reflux  - VAS [place] LOWER EXTREMITY VENOUS REFLUX; Future  2. Hyperlipidemia, unspecified hyperlipidemia type - Stable Encouraged good control as its slows the progression of atherosclerotic disease  3. Type 2 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 disease, without long-term current use of insulin (HCC) - Stable Encouraged good control as its slows the progression of atherosclerotic disease  Current Outpatient Prescriptions on File Prior to Visit  Medication Sig Dispense Refill  . Blood Glucose Monitoring Suppl (ONE TOUCH ULTRA 2) w/Device KIT     . buPROPion (WELLBUTRIN SR) 150 MG 12 hr tablet Take 1 tablet (150 mg total) by mouth 2 (two) times daily. 1 daily for 1 week, then 1 BID 180 tablet 1  . ferrous sulfate (FERROUSUL) 325 (65 FE) MG tablet Take 1 tablet (325 mg total) by mouth 2 (two) times daily with a meal. 180 tablet 1  . furosemide (LASIX) 20 MG tablet Take 1 tablet (20 mg total) by mouth daily. 30 tablet 3  . glipiZIDE (GLUCOTROL) 5 MG tablet TAKE ONE TABLET BY MOUTH ONCE DAILY BEFORE BREAKFAST 90 tablet 1  . lisinopril-hydrochlorothiazide (PRINZIDE,ZESTORETIC) 20-25 MG tablet Take 2 tablets by mouth daily. 180 tablet 1  . meclizine (ANTIVERT) 25 MG tablet Take 1 tablet (25 mg total) by mouth 3 (three) times daily as needed for dizziness. 30 tablet 0  . metFORMIN  (GLUCOPHAGE) 1000 MG tablet Take 1 tablet (1,000 mg total) by mouth 2 (two) times daily with a meal. 180 tablet 1  . Multiple Vitamin (MULTIVITAMIN) tablet Take 1 tablet by mouth daily.    Marland Kitchen nystatin (MYCOSTATIN/NYSTOP) powder Apply topically 4 (four) times daily. 15 g 0  . omeprazole (PRILOSEC) 20 MG capsule Take 1 capsule (20 mg total) by mouth daily. 30 capsule 3  . ondansetron (ZOFRAN ODT) 4 MG disintegrating tablet Take 1 tablet (4 mg total) by mouth every 8 (eight) hours as needed for nausea or vomiting. 20 tablet 3  . ONE TOUCH ULTRA TEST test strip     . ONETOUCH DELICA LANCETS 92E MISC     . sitaGLIPtin (JANUVIA) 100 MG tablet Take  1 tablet (100 mg total) by mouth daily. 30 tablet 3   No current facility-administered medications on file prior to visit.     There are no Patient Instructions on file for this visit. No Follow-up on file.   Railey Glad A Nalia Honeycutt, PA-C

## 2016-04-07 ENCOUNTER — Other Ambulatory Visit: Payer: Self-pay | Admitting: Family Medicine

## 2016-04-09 ENCOUNTER — Encounter (INDEPENDENT_AMBULATORY_CARE_PROVIDER_SITE_OTHER): Payer: Medicare Other

## 2016-04-09 ENCOUNTER — Ambulatory Visit: Payer: Medicare Other | Admitting: Family Medicine

## 2016-04-11 ENCOUNTER — Ambulatory Visit (INDEPENDENT_AMBULATORY_CARE_PROVIDER_SITE_OTHER): Payer: Medicare Other | Admitting: Vascular Surgery

## 2016-04-11 ENCOUNTER — Encounter (INDEPENDENT_AMBULATORY_CARE_PROVIDER_SITE_OTHER): Payer: Self-pay

## 2016-04-11 VITALS — BP 120/63 | HR 88 | Resp 16

## 2016-04-11 DIAGNOSIS — M7989 Other specified soft tissue disorders: Secondary | ICD-10-CM

## 2016-04-11 NOTE — Progress Notes (Signed)
History of Present Illness  There is no documented history at this time  Assessments & Plan   There are no diagnoses linked to this encounter.    Additional instructions  Subjective:  Patient presents with venous ulcer of the Bilateral lower extremity.    Procedure:  3 layer unna wrap was placed Bilateral lower extremity.   Plan:   Follow up in one week.  

## 2016-04-16 ENCOUNTER — Ambulatory Visit (INDEPENDENT_AMBULATORY_CARE_PROVIDER_SITE_OTHER): Payer: Medicare Other | Admitting: Vascular Surgery

## 2016-04-16 ENCOUNTER — Encounter: Payer: Self-pay | Admitting: Family Medicine

## 2016-04-16 ENCOUNTER — Encounter (INDEPENDENT_AMBULATORY_CARE_PROVIDER_SITE_OTHER): Payer: Self-pay | Admitting: Vascular Surgery

## 2016-04-16 ENCOUNTER — Ambulatory Visit (INDEPENDENT_AMBULATORY_CARE_PROVIDER_SITE_OTHER): Payer: Medicare Other | Admitting: Family Medicine

## 2016-04-16 VITALS — BP 129/70 | HR 81 | Resp 16 | Ht 65.0 in | Wt 196.0 lb

## 2016-04-16 VITALS — BP 128/70 | HR 75 | Temp 98.6°F | Wt 196.1 lb

## 2016-04-16 DIAGNOSIS — I129 Hypertensive chronic kidney disease with stage 1 through stage 4 chronic kidney disease, or unspecified chronic kidney disease: Secondary | ICD-10-CM | POA: Diagnosis not present

## 2016-04-16 DIAGNOSIS — L602 Onychogryphosis: Secondary | ICD-10-CM | POA: Diagnosis not present

## 2016-04-16 DIAGNOSIS — M7989 Other specified soft tissue disorders: Secondary | ICD-10-CM

## 2016-04-16 DIAGNOSIS — E1122 Type 2 diabetes mellitus with diabetic chronic kidney disease: Secondary | ICD-10-CM | POA: Diagnosis not present

## 2016-04-16 DIAGNOSIS — Z1211 Encounter for screening for malignant neoplasm of colon: Secondary | ICD-10-CM

## 2016-04-16 DIAGNOSIS — N182 Chronic kidney disease, stage 2 (mild): Secondary | ICD-10-CM

## 2016-04-16 NOTE — Assessment & Plan Note (Signed)
Significantly improved with una boots- continue to follow with vascular. Continue to monitor.

## 2016-04-16 NOTE — Progress Notes (Signed)
BP 128/70   Pulse 75   Temp 98.6 F (37 C)   Wt 196 lb 1.6 oz (89 kg)   SpO2 99%   BMI 31.65 kg/m    Subjective:    Patient ID: Alison Hernandez, female    DOB: 06/11/1945, 71 y.o.   MRN: [id number]  HPI: Alison Hernandez is a 71 y.o. female  Chief Complaint  Patient presents with  . Hypertension   HYPERTENSION Hypertension status: better  Satisfied with current treatment? yes Duration of hypertension: chronic BP monitoring frequency:  not checking BP medication side effects:  no Medication compliance: excellent compliance Aspirin: no Recurrent headaches: no Visual changes: no Palpitations: no Dyspnea: no Chest pain: no Lower extremity edema: no Dizzy/lightheaded: no   Relevant past medical, surgical, family and social history reviewed and updated as indicated. Interim medical history since our last visit reviewed. Allergies and medications reviewed and updated.  Review of Systems  Constitutional: Negative.   Respiratory: Negative.   Cardiovascular: Negative.   Gastrointestinal: Positive for diarrhea, nausea and vomiting. Negative for abdominal distention, abdominal pain, anal bleeding, blood in stool, constipation and rectal pain.  Psychiatric/Behavioral: Negative.     Per HPI unless specifically indicated above     Objective:    BP 128/70   Pulse 75   Temp 98.6 F (37 C)   Wt 196 lb 1.6 oz (89 kg)   SpO2 99%   BMI 31.65 kg/m   Wt Readings from Last 3 Encounters:  04/16/16 196 lb 1.6 oz (89 kg)  04/02/16 200 lb (90.7 kg)  03/26/16 201 lb (91.2 kg)    Physical Exam  Constitutional: She is oriented to person, place, and time. She appears well-developed and well-nourished. No distress.  HENT:  Head: Normocephalic and atraumatic.  Right Ear: Hearing normal.  Left Ear: Hearing normal.  Nose: Nose normal.  Eyes: Conjunctivae and lids are normal. Right eye exhibits no discharge. Left eye exhibits no discharge. No scleral icterus.  Cardiovascular:  Normal rate, regular rhythm, normal heart sounds and intact distal pulses.  Exam reveals no gallop and no friction rub.   No murmur heard. Pulmonary/Chest: Effort normal and breath sounds normal. No respiratory distress. She has no wheezes. She has no rales. She exhibits no tenderness.  Musculoskeletal: Normal range of motion.  Neurological: She is alert and oriented to person, place, and time.  Skin: Skin is warm, dry and intact. No rash noted. No erythema. No pallor.  Psychiatric: She has a normal mood and affect. Her speech is normal and behavior is normal. Judgment and thought content normal. Cognition and memory are normal.  Nursing note and vitals reviewed.  Diabetic Foot Exam - Simple   Simple Foot Form Diabetic Foot exam was performed with the following findings:  Yes 04/16/2016  8:57 AM  Visual Inspection No deformities, no ulcerations, no other skin breakdown bilaterally:  Yes Sensation Testing Intact to touch and monofilament testing bilaterally:  Yes Pulse Check Posterior Tibialis and Dorsalis pulse intact bilaterally:  Yes Comments Hypertrophic toenails     Results for orders placed or performed in visit on 03/05/16  Basic metabolic panel  Result Value Ref Range   Glucose 125 (H) 65 - 99 mg/dL   BUN 17 8 - 27 mg/dL   Creatinine, Ser 6.57 (H) 0.57 - 1.00 mg/dL   GFR calc non Af Amer 57 (L) >59 mL/min/1.73   GFR calc Af Amer 66 >59 mL/min/1.73   BUN/Creatinine Ratio 17 12 - 28  Sodium 137 134 - 144 mmol/L   Potassium 4.1 3.5 - 5.2 mmol/L   Chloride 99 96 - 106 mmol/L   CO2 24 18 - 29 mmol/L   Calcium 10.0 8.7 - 10.3 mg/dL  TSH  Result Value Ref Range   TSH 2.540 0.450 - 4.500 uIU/mL      Assessment & Plan:   Problem List Items Addressed This Visit      Endocrine   Diabetes (HCC)    Sugars high yesterday due to GI bug. Continue diet and exercise. Continue to monitor.       Relevant Orders   Ambulatory referral to Podiatry     Genitourinary   Benign  hypertensive renal disease - Primary    Better on recheck. Continue current regimen. Continue to monitor. Call with any concerns.       Relevant Orders   Basic metabolic panel     Other   Swelling of lower limb    Significantly improved with una boots- continue to follow with vascular. Continue to monitor.        Other Visit Diagnoses    Screening for colon cancer       Cologuard ordered today   Relevant Orders   Cologuard   Hypertrophic toenail       Having trouble cutting her nails. Would like to see podiatry. Referral generated today.   Relevant Orders   Ambulatory referral to Podiatry       Follow up plan: Return in about 2 months (around 06/14/2016) for DM visit with A1c.

## 2016-04-16 NOTE — Assessment & Plan Note (Signed)
Better on recheck. Continue current regimen. Continue to monitor. Call with any concerns.  

## 2016-04-16 NOTE — Progress Notes (Signed)
History of Present Illness  There is no documented history at this time  Assessments & Plan   There are no diagnoses linked to this encounter.    Additional instructions  Subjective:  Patient presents with venous ulcer of the Bilateral lower extremity.    Procedure:  3 layer unna wrap was placed Bilateral lower extremity.   Plan:   Follow up in one week.  

## 2016-04-16 NOTE — Assessment & Plan Note (Signed)
Sugars high yesterday due to GI bug. Continue diet and exercise. Continue to monitor.

## 2016-04-23 ENCOUNTER — Ambulatory Visit (INDEPENDENT_AMBULATORY_CARE_PROVIDER_SITE_OTHER): Payer: Medicare Other | Admitting: Vascular Surgery

## 2016-04-30 ENCOUNTER — Ambulatory Visit (INDEPENDENT_AMBULATORY_CARE_PROVIDER_SITE_OTHER): Payer: Medicare Other | Admitting: Vascular Surgery

## 2016-05-06 ENCOUNTER — Ambulatory Visit: Payer: Medicare Other | Admitting: Podiatry

## 2016-05-07 ENCOUNTER — Ambulatory Visit (INDEPENDENT_AMBULATORY_CARE_PROVIDER_SITE_OTHER): Payer: Medicare Other | Admitting: Vascular Surgery

## 2016-05-21 ENCOUNTER — Ambulatory Visit: Payer: Medicare Other | Admitting: Family Medicine

## 2016-05-26 ENCOUNTER — Other Ambulatory Visit: Payer: Self-pay | Admitting: Family Medicine

## 2016-05-28 ENCOUNTER — Ambulatory Visit: Payer: Medicare Other | Admitting: Podiatry

## 2016-06-04 ENCOUNTER — Encounter: Payer: Self-pay | Admitting: Family Medicine

## 2016-06-04 ENCOUNTER — Ambulatory Visit (INDEPENDENT_AMBULATORY_CARE_PROVIDER_SITE_OTHER): Payer: Medicare Other | Admitting: Family Medicine

## 2016-06-04 VITALS — BP 138/74 | HR 84 | Ht 65.0 in | Wt 197.0 lb

## 2016-06-04 DIAGNOSIS — F3341 Major depressive disorder, recurrent, in partial remission: Secondary | ICD-10-CM

## 2016-06-04 DIAGNOSIS — E785 Hyperlipidemia, unspecified: Secondary | ICD-10-CM

## 2016-06-04 DIAGNOSIS — I129 Hypertensive chronic kidney disease with stage 1 through stage 4 chronic kidney disease, or unspecified chronic kidney disease: Secondary | ICD-10-CM

## 2016-06-04 DIAGNOSIS — D649 Anemia, unspecified: Secondary | ICD-10-CM

## 2016-06-04 DIAGNOSIS — E1122 Type 2 diabetes mellitus with diabetic chronic kidney disease: Secondary | ICD-10-CM

## 2016-06-04 DIAGNOSIS — N182 Chronic kidney disease, stage 2 (mild): Secondary | ICD-10-CM

## 2016-06-04 LAB — MICROALBUMIN, URINE WAIVED
CREATININE, URINE WAIVED: 100 mg/dL (ref 10–300)
MICROALB, UR WAIVED: 30 mg/L — AB (ref 0–19)

## 2016-06-04 LAB — BAYER DCA HB A1C WAIVED: HB A1C (BAYER DCA - WAIVED): 7.4 % — ABNORMAL HIGH (ref <7.0)

## 2016-06-04 MED ORDER — LISINOPRIL-HYDROCHLOROTHIAZIDE 20-25 MG PO TABS: 2.0000 | ORAL_TABLET | Freq: Every day | ORAL | 1 refills | Status: DC

## 2016-06-04 MED ORDER — FERROUS SULFATE 325 (65 FE) MG PO TABS: 325.0000 mg | ORAL_TABLET | Freq: Two times a day (BID) | ORAL | 1 refills | Status: DC

## 2016-06-04 MED ORDER — OMEPRAZOLE 20 MG PO CPDR: 20.0000 mg | DELAYED_RELEASE_CAPSULE | Freq: Every day | ORAL | 3 refills | Status: DC

## 2016-06-04 MED ORDER — METFORMIN HCL 1000 MG PO TABS: 1000.0000 mg | ORAL_TABLET | Freq: Two times a day (BID) | ORAL | 1 refills | Status: DC

## 2016-06-04 MED ORDER — BUPROPION HCL ER (SR) 150 MG PO TB12: 150.0000 mg | ORAL_TABLET | Freq: Two times a day (BID) | ORAL | 1 refills | Status: DC

## 2016-06-04 MED ORDER — SITAGLIPTIN PHOSPHATE 100 MG PO TABS: 100.0000 mg | ORAL_TABLET | Freq: Every day | ORAL | 1 refills | Status: DC

## 2016-06-04 MED ORDER — NYSTATIN 100000 UNIT/GM EX POWD: Freq: Four times a day (QID) | CUTANEOUS | 0 refills | Status: DC

## 2016-06-04 MED ORDER — GLIPIZIDE 5 MG PO TABS: ORAL_TABLET | ORAL | 1 refills | Status: DC

## 2016-06-04 NOTE — Assessment & Plan Note (Addendum)
A1c came back today at 7.4. Will cut back on sweets. Continue to monitor. Recheck 3 months.

## 2016-06-04 NOTE — Assessment & Plan Note (Signed)
Better on recheck. Continue current regimen. Continue to monitor. Call with any concerns.  

## 2016-06-04 NOTE — Assessment & Plan Note (Signed)
Under good control. Continue current regimen. Continue to monitor.  

## 2016-06-04 NOTE — Assessment & Plan Note (Signed)
Rechecking levels today. Await results.  

## 2016-06-04 NOTE — Progress Notes (Signed)
BP 138/74   Pulse 84   Ht 5\' 5"  (1.651 m)   Wt 197 lb (89.4 kg)   SpO2 99%   BMI 32.78 kg/m    Subjective:    Patient ID: Alison Hernandez, female    DOB: December 10, 1945, 71 y.o.   MRN: [id number]  HPI: Alison Hernandez is a 71 y.o. female  Chief Complaint  Patient presents with  . Follow-up  . Hypertension  . Diabetes    182 this AM fasting.    DIABETES Hypoglycemic episodes:no Polydipsia/polyuria: no Visual disturbance: no Chest pain: no Paresthesias: no Glucose Monitoring: yes  Accucheck frequency: Daily  Fasting glucose: 182 this AM, 209 Taking Insulin?: no Blood Pressure Monitoring: not checking Retinal Examination: Not up to Date Foot Exam: Up to Date Diabetic Education: Completed Pneumovax: Up to Date Influenza: Up to Date Aspirin: yes  HYPERTENSION / HYPERLIPIDEMIA Satisfied with current treatment? yes Duration of hypertension: chronic BP monitoring frequency: not checking BP medication side effects: no Past BP meds: lisinopril-HCTZ Duration of hyperlipidemia: chronic Cholesterol medication side effects: Not on anything Medication compliance: excellent compliance Aspirin: yes Recent stressors: no Recurrent headaches: no Visual changes: no Palpitations: no Dyspnea: no Chest pain: no Lower extremity edema: no Dizzy/lightheaded: no  ANEMIA Anemia status: controlled Etiology of anemia: iron defiency Duration of anemia treatment: chronic Compliance with treatment: good compliance Iron supplementation side effects: no Severity of anemia: mild Fatigue: yes Decreased exercise tolerance: no  Dyspnea on exertion: no Palpitations: no Bleeding: no Pica: no  DEPRESSION Mood status: controlled Satisfied with current treatment?: yes Symptom severity: mild  Duration of current treatment : chronic Side effects: no Medication compliance: excellent compliance Psychotherapy/counseling: no  Depressed mood: no Anxious mood: no Anhedonia:  no Significant weight loss or gain: no Insomnia: no  Fatigue: yes Feelings of worthlessness or guilt: no Impaired concentration/indecisiveness: no Suicidal ideations: no Hopelessness: no Crying spells: no Depression screen [facility] 2/9 06/04/2016 10/25/2015 08/29/2015 04/25/2015 03/28/2015  Decreased Interest 0 0 0 0 0  Down, Depressed, Hopeless 0 0 0 0 0  PHQ - 2 Score 0 0 0 0 0  Altered sleeping - 0 - - -  Tired, decreased energy - 0 - - -  Change in appetite - 0 - - -  Feeling bad or failure about yourself  - 0 - - -  Trouble concentrating - 0 - - -  Moving slowly or fidgety/restless - 0 - - -  Suicidal thoughts - 0 - - -  PHQ-9 Score - 0 - - -     Relevant past medical, surgical, family and social history reviewed and updated as indicated. Interim medical history since our last visit reviewed. Allergies and medications reviewed and updated.  Review of Systems  Constitutional: Negative.   Respiratory: Negative.   Cardiovascular: Negative.   Psychiatric/Behavioral: Negative.     Per HPI unless specifically indicated above     Objective:    BP 138/74   Pulse 84   Ht 5\' 5"  (1.651 m)   Wt 197 lb (89.4 kg)   SpO2 99%   BMI 32.78 kg/m   Wt Readings from Last 3 Encounters:  06/04/16 197 lb (89.4 kg)  04/16/16 196 lb (88.9 kg)  04/16/16 196 lb 1.6 oz (89 kg)    Physical Exam  Constitutional: She is oriented to person, place, and time. She appears well-developed and well-nourished. No distress.  HENT:  Head: Normocephalic and atraumatic.  Right Ear: Hearing normal.  Left Ear: Hearing  normal.  Nose: Nose normal.  Eyes: Conjunctivae and lids are normal. Right eye exhibits no discharge. Left eye exhibits no discharge. No scleral icterus.  Cardiovascular: Normal rate, regular rhythm, normal heart sounds and intact distal pulses.  Exam reveals no gallop and no friction rub.   No murmur heard. Pulmonary/Chest: Effort normal and breath sounds normal. No respiratory distress. She has  no wheezes. She has no rales. She exhibits no tenderness.  Musculoskeletal: Normal range of motion.  Neurological: She is alert and oriented to person, place, and time.  Skin: Skin is warm, dry and intact. No rash noted. No erythema. No pallor.  Psychiatric: She has a normal mood and affect. Her speech is normal and behavior is normal. Judgment and thought content normal. Cognition and memory are normal.  Nursing note and vitals reviewed.   Results for orders placed or performed in visit on 03/05/16  Basic metabolic panel  Result Value Ref Range   Glucose 125 (H) 65 - 99 mg/dL   BUN 17 8 - 27 mg/dL   Creatinine, Ser 1.611.01 (H) 0.57 - 1.00 mg/dL   GFR calc non Af Amer 57 (L) >59 mL/min/1.73   GFR calc Af Amer 66 >59 mL/min/1.73   BUN/Creatinine Ratio 17 12 - 28   Sodium 137 134 - 144 mmol/L   Potassium 4.1 3.5 - 5.2 mmol/L   Chloride 99 96 - 106 mmol/L   CO2 24 18 - 29 mmol/L   Calcium 10.0 8.7 - 10.3 mg/dL  TSH  Result Value Ref Range   TSH 2.540 0.450 - 4.500 uIU/mL      Assessment & Plan:   Problem List Items Addressed This Visit      Endocrine   Diabetes (HCC) - Primary    A1c came back today at 7.4. Will cut back on sweets. Continue to monitor. Recheck 3 months.       Relevant Medications   metFORMIN (GLUCOPHAGE) 1000 MG tablet   lisinopril-hydrochlorothiazide (PRINZIDE,ZESTORETIC) 20-25 MG tablet   sitaGLIPtin (JANUVIA) 100 MG tablet   glipiZIDE (GLUCOTROL) 5 MG tablet   Other Relevant Orders   Bayer DCA Hb A1c Waived   Comprehensive metabolic panel   Microalbumin, Urine Waived     Genitourinary   Benign hypertensive renal disease    Better on recheck. Continue current regimen. Continue to monitor. Call with any concerns.       Relevant Orders   Comprehensive metabolic panel   Microalbumin, Urine Waived     Other   Hyperlipidemia    Rechecking levels today. Await results.       Relevant Medications   lisinopril-hydrochlorothiazide (PRINZIDE,ZESTORETIC)  20-25 MG tablet   Other Relevant Orders   Comprehensive metabolic panel   Lipid Panel w/o Chol/HDL Ratio   Depression    Under good control. Continue current regimen. Continue to monitor.       Relevant Medications   buPROPion (WELLBUTRIN SR) 150 MG 12 hr tablet   Anemia    Rechecking levels today. Await results.       Relevant Medications   ferrous sulfate (FERROUSUL) 325 (65 FE) MG tablet   Other Relevant Orders   Comprehensive metabolic panel   CBC with Differential/Platelet       Follow up plan: Return in about 3 months (around 09/04/2016) for DM follow up with A1c, 6 months Wellness.

## 2016-06-05 ENCOUNTER — Telehealth: Payer: Self-pay | Admitting: Family Medicine

## 2016-06-05 LAB — COMPREHENSIVE METABOLIC PANEL
A/G RATIO: 1.6 (ref 1.2–2.2)
ALT: 11 IU/L (ref 0–32)
AST: 21 IU/L (ref 0–40)
Albumin: 4.3 g/dL (ref 3.6–4.8)
Alkaline Phosphatase: 100 IU/L (ref 39–117)
BILIRUBIN TOTAL: 0.3 mg/dL (ref 0.0–1.2)
BUN/Creatinine Ratio: 19 (ref 12–28)
BUN: 22 mg/dL (ref 8–27)
CALCIUM: 9.3 mg/dL (ref 8.7–10.3)
CHLORIDE: 98 mmol/L (ref 96–106)
CO2: 24 mmol/L (ref 18–29)
Creatinine, Ser: 1.15 mg/dL — ABNORMAL HIGH (ref 0.57–1.00)
GFR calc non Af Amer: 49 mL/min/{1.73_m2} — ABNORMAL LOW (ref >59)
GFR, EST AFRICAN AMERICAN: 56 mL/min/{1.73_m2} — AB (ref >59)
GLOBULIN, TOTAL: 2.7 g/dL (ref 1.5–4.5)
Glucose: 108 mg/dL — ABNORMAL HIGH (ref 65–99)
Potassium: 4.5 mmol/L (ref 3.5–5.2)
Sodium: 137 mmol/L (ref 134–144)
Total Protein: 7 g/dL (ref 6.0–8.5)

## 2016-06-05 LAB — LIPID PANEL W/O CHOL/HDL RATIO
CHOLESTEROL TOTAL: 222 mg/dL — AB (ref 100–199)
HDL: 55 mg/dL (ref >39)
LDL Calculated: 136 mg/dL — ABNORMAL HIGH (ref 0–99)
Triglycerides: 155 mg/dL — ABNORMAL HIGH (ref 0–149)
VLDL Cholesterol Cal: 31 mg/dL (ref 5–40)

## 2016-06-05 LAB — CBC WITH DIFFERENTIAL/PLATELET
BASOS: 0 %
Basophils Absolute: 0 10*3/uL (ref 0.0–0.2)
EOS (ABSOLUTE): 0.2 10*3/uL (ref 0.0–0.4)
Eos: 2 %
Hematocrit: 31.7 % — ABNORMAL LOW (ref 34.0–46.6)
Hemoglobin: 10.7 g/dL — ABNORMAL LOW (ref 11.1–15.9)
Immature Grans (Abs): 0 10*3/uL (ref 0.0–0.1)
Immature Granulocytes: 0 %
Lymphocytes Absolute: 1.8 10*3/uL (ref 0.7–3.1)
Lymphs: 26 %
MCH: 29.1 pg (ref 26.6–33.0)
MCHC: 33.8 g/dL (ref 31.5–35.7)
MCV: 86 fL (ref 79–97)
MONOS ABS: 0.5 10*3/uL (ref 0.1–0.9)
Monocytes: 7 %
NEUTROS ABS: 4.4 10*3/uL (ref 1.4–7.0)
Neutrophils: 65 %
Platelets: 297 10*3/uL (ref 150–379)
RBC: 3.68 x10E6/uL — ABNORMAL LOW (ref 3.77–5.28)
RDW: 14.3 % (ref 12.3–15.4)
WBC: 6.9 10*3/uL (ref 3.4–10.8)

## 2016-06-05 MED ORDER — ROSUVASTATIN CALCIUM 10 MG PO TABS: 10.0000 mg | ORAL_TABLET | Freq: Every day | ORAL | 3 refills | Status: DC

## 2016-06-05 NOTE — Telephone Encounter (Signed)
Called and discussed below with patient. She will start crestor. Rx sent to her pharmacy

## 2016-06-05 NOTE — Telephone Encounter (Signed)
[facility]. Her labs look pretty good- she's a little dehydrated again and her cholesterol is still pretty high. I'd like to start her on a cholesterol medicine to help protect her her heart. I know she had trouble with lovastatin before, but I'd like to start her on crestor, which is usually better tolerated. If she's OK with this I'll send her in a low dose to her pharmacy. If not, we can keep monitoring. OK to give her this message when she calls.

## 2016-06-11 ENCOUNTER — Ambulatory Visit (INDEPENDENT_AMBULATORY_CARE_PROVIDER_SITE_OTHER): Payer: Medicare Other | Admitting: Podiatry

## 2016-06-11 ENCOUNTER — Ambulatory Visit (INDEPENDENT_AMBULATORY_CARE_PROVIDER_SITE_OTHER): Payer: Medicare Other

## 2016-06-11 ENCOUNTER — Encounter: Payer: Self-pay | Admitting: Podiatry

## 2016-06-11 VITALS — BP 126/70 | HR 83 | Resp 16

## 2016-06-11 DIAGNOSIS — M79676 Pain in unspecified toe(s): Secondary | ICD-10-CM | POA: Diagnosis not present

## 2016-06-11 DIAGNOSIS — B351 Tinea unguium: Secondary | ICD-10-CM | POA: Diagnosis not present

## 2016-06-11 DIAGNOSIS — M2042 Other hammer toe(s) (acquired), left foot: Secondary | ICD-10-CM

## 2016-06-11 DIAGNOSIS — M2041 Other hammer toe(s) (acquired), right foot: Secondary | ICD-10-CM

## 2016-06-11 DIAGNOSIS — E119 Type 2 diabetes mellitus without complications: Secondary | ICD-10-CM | POA: Diagnosis not present

## 2016-06-11 DIAGNOSIS — L603 Nail dystrophy: Secondary | ICD-10-CM | POA: Diagnosis not present

## 2016-06-11 DIAGNOSIS — Q828 Other specified congenital malformations of skin: Secondary | ICD-10-CM

## 2016-06-11 DIAGNOSIS — Z0189 Encounter for other specified special examinations: Secondary | ICD-10-CM

## 2016-06-11 NOTE — Progress Notes (Signed)
   Subjective:    Patient ID: Alison Hernandez, female    DOB: 06/30/1945, 71 y.o.   MRN: [id number]  HPI: She presents today with a chief complaint of discolored thick possibly mycotic nails she was recommended to come here by her general practitioner.    Review of Systems  Cardiovascular: Positive for leg swelling.  Musculoskeletal: Positive for arthralgias and gait problem.  All other systems reviewed and are negative.      Objective:   Physical Exam: Vital signs are stable alert and oriented 3 pulses are strongly palpable. Neurologic sensation is intact. Degenerative flexors are intact. Muscle strength is 5 over 5 dorsiflexion plantar flexors and inverters everters onto the musculatures intact. Orthopedic evaluation demonstrates old to assist with ankle full range of motion or crepitation area cutaneous evaluation is intact. Toenails are thick yellow dystrophic onychomycotic hallux bilateral and second digit right. Remainder of the nails appear to be in good shape. No open lesions or wounds are noted. Radiographs taken today because she is diabetes does demonstrate mild hammertoe deformities but no other major osseous abnormality. 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        Assessment & Plan:  Assessment: Diabetes non-complicated. The dystrophic nails hallux bilateral. Porokeratosis  Plan: Samples of the nails are taken today. Pathologic evaluation follow-up with her in the near future. Debrided porokeratosis.

## 2016-06-11 NOTE — Addendum Note (Signed)
Addended by: Geraldine ContrasVENABLE, ANGELA D on: 06/11/2016 03:54 PM   Modules accepted: Orders

## 2016-06-28 ENCOUNTER — Emergency Department
Admission: EM | Admit: 2016-06-28 | Discharge: 2016-06-28 | Disposition: A | Discharge status: Home / Self Care | Payer: Worker's Compensation | Attending: Emergency Medicine | Admitting: Emergency Medicine

## 2016-06-28 ENCOUNTER — Emergency Department: Payer: Worker's Compensation

## 2016-06-28 DIAGNOSIS — W19XXXA Unspecified fall, initial encounter: Secondary | ICD-10-CM | POA: Diagnosis not present

## 2016-06-28 DIAGNOSIS — Z79899 Other long term (current) drug therapy: Secondary | ICD-10-CM | POA: Diagnosis not present

## 2016-06-28 DIAGNOSIS — Z794 Long term (current) use of insulin: Secondary | ICD-10-CM | POA: Diagnosis not present

## 2016-06-28 DIAGNOSIS — Y990 Civilian activity done for income or pay: Secondary | ICD-10-CM | POA: Insufficient documentation

## 2016-06-28 DIAGNOSIS — Y929 Unspecified place or not applicable: Secondary | ICD-10-CM | POA: Diagnosis not present

## 2016-06-28 DIAGNOSIS — W1839XA Other fall on same level, initial encounter: Secondary | ICD-10-CM | POA: Insufficient documentation

## 2016-06-28 DIAGNOSIS — I10 Essential (primary) hypertension: Secondary | ICD-10-CM | POA: Diagnosis not present

## 2016-06-28 DIAGNOSIS — Y939 Activity, unspecified: Secondary | ICD-10-CM | POA: Insufficient documentation

## 2016-06-28 DIAGNOSIS — E119 Type 2 diabetes mellitus without complications: Secondary | ICD-10-CM | POA: Insufficient documentation

## 2016-06-28 DIAGNOSIS — M25511 Pain in right shoulder: Secondary | ICD-10-CM | POA: Diagnosis not present

## 2016-06-28 DIAGNOSIS — M79601 Pain in right arm: Secondary | ICD-10-CM

## 2016-06-28 DIAGNOSIS — S59911A Unspecified injury of right forearm, initial encounter: Secondary | ICD-10-CM | POA: Diagnosis present

## 2016-06-28 MED ORDER — MELOXICAM 15 MG PO TABS: 15.0000 mg | ORAL_TABLET | Freq: Every day | ORAL | 0 refills | Status: DC

## 2016-06-28 MED ORDER — BACLOFEN 10 MG PO TABS: 10.0000 mg | ORAL_TABLET | Freq: Three times a day (TID) | ORAL | 0 refills | Status: DC

## 2016-06-28 NOTE — Discharge Instructions (Signed)
Wear the sling for the next 3 days when you are out of bed. If pain persists for more than 3 days, call and schedule a follow-up appointment with orthopedics. Take the medications as prescribed. Return to the emergency department for symptoms that change or worsen if you're unable to schedule an appointment.

## 2016-06-28 NOTE — ED Notes (Signed)
This RN called patient's place of employment ([name]), and spoke with manager Remona Griffis. Employer declined urine drug screen and/or additional labs.

## 2016-06-28 NOTE — ED Provider Notes (Signed)
[facility] Emergency Department Provider Note ____________________________________________  Time seen: Approximately 8:34 PM  I have reviewed the triage vital signs and the nursing notes.   HISTORY  Chief Complaint Arm Pain (right; post fall)    HPI Alison Hernandez is a 71 y.o. female who presents to the emergency department for evaluation of right upper arm pain due to a mechanical, nonsyncopal fall while at work. While loading a box onto a hand truck, it fell over and she did too. She landed on her right side and hit her upper arm/shoulder. Initially she was unable to move the arm at all, but now it is mostly painful when she tries to abduct it. No pain in the elbow or wrist. No pain in the right hip or leg.  Past Medical History:  Diagnosis Date  . Diabetes mellitus without complication ([place])   . Hyperlipidemia   . Hypertension     Patient Active Problem List   Diagnosis Date Noted  . Swelling of lower limb 04/02/2016  . Anemia 08/30/2015  . Diabetes ([place]) 03/28/2015  . 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 Hyperlipidemia 03/28/2015  . Depression 03/28/2015    Past Surgical History:  Procedure Laterality Date  . ABDOMINAL HYSTERECTOMY     total  . BREAST BIOPSY Left 20+ yrs ago   benign  . BREAST BIOPSY Left 20+ yrs ago   benign  . SIGMOIDOSCOPY  1990    Prior to Admission medications   Medication Sig Start Date End Date Taking? Authorizing Provider  baclofen (LIORESAL) 10 MG tablet Take 1 tablet (10 mg total) by mouth 3 (three) times daily. 06/28/16   Victorino Dike, FNP  Blood Glucose Monitoring Suppl (ONE TOUCH ULTRA 2) w/Device KIT  12/26/15   Historical Provider, MD  buPROPion (WELLBUTRIN SR) 150 MG 12 hr tablet Take 1 tablet (150 mg total) by mouth 2 (two) times daily. 1 daily for 1 week, then 1 BID 06/04/16   Megan P Johnson, DO  ferrous sulfate (FERROUSUL) 325 (65 FE) MG tablet Take 1 tablet (325 mg total) by mouth 2 (two)  times daily with a meal. 06/04/16   Megan P Johnson, DO  glipiZIDE (GLUCOTROL) 5 MG tablet TAKE ONE TABLET BY MOUTH ONCE DAILY BEFORE BREAKFAST 06/04/16   Megan P Johnson, DO  lisinopril-hydrochlorothiazide (PRINZIDE,ZESTORETIC) 20-25 MG tablet Take 2 tablets by mouth daily. 06/04/16   Megan P Johnson, DO  meclizine (ANTIVERT) 25 MG tablet Take 1 tablet (25 mg total) by mouth 3 (three) times daily as needed for dizziness. 12/19/15   Volney American, PA-C  meloxicam (MOBIC) 15 MG tablet Take 1 tablet (15 mg total) by mouth daily. 06/28/16   Victorino Dike, FNP  metFORMIN (GLUCOPHAGE) 1000 MG tablet Take 1 tablet (1,000 mg total) by mouth 2 (two) times daily with a meal. 06/04/16   Megan P Johnson, DO  Multiple Vitamin (MULTIVITAMIN) tablet Take 1 tablet by mouth daily.    Historical Provider, MD  nystatin (MYCOSTATIN/NYSTOP) powder Apply topically 4 (four) times daily. 06/04/16   Megan P Johnson, DO  omeprazole (PRILOSEC) 20 MG capsule Take 1 capsule (20 mg total) by mouth daily. 06/04/16   Megan P Johnson, DO  ondansetron (ZOFRAN ODT) 4 MG disintegrating tablet Take 1 tablet (4 mg total) by mouth every 8 (eight) hours as needed for nausea or vomiting. 02/01/16   Megan P Johnson, DO  ONE TOUCH ULTRA TEST test strip  12/26/15   Historical Provider, MD  ONETOUCH DELICA LANCETS 70J MISC  12/26/15   Historical Provider, MD  rosuvastatin (CRESTOR) 10 MG tablet Take 1 tablet (10 mg total) by mouth daily. 06/05/16   Megan P Johnson, DO  sitaGLIPtin (JANUVIA) 100 MG tablet Take 1 tablet (100 mg total) by mouth daily. 06/04/16   Megan P Johnson, DO    Allergies Lovastatin and Bydureon [exenatide]  Family History  Problem Relation Age of Onset  . Diabetes Mother   . Heart disease Mother   . Cancer Father     esophagus/ lung  . Diabetes Maternal Grandmother   . Cancer Sister     Ovarian    Social History Social History  Substance Use Topics  . Smoking status: Never Smoker  . Smokeless tobacco: Never  Used  . Alcohol use No    Review of Systems Constitutional: No recent illness. Cardiovascular: Denies chest pain or palpitations. Respiratory: Denies shortness of breath. Musculoskeletal: Pain in right shoulder. Skin: Negative for rash, wound, lesion. Neurological: Negative for focal weakness or numbness.  ____________________________________________   PHYSICAL EXAM:  VITAL SIGNS: ED Triage Vitals  Enc Vitals Group     BP 06/28/16 1908 125/65     Pulse Rate 06/28/16 1908 76     Resp 06/28/16 1908 15     Temp 06/28/16 1908 98.6 F (37 C)     Temp Source 06/28/16 1908 Oral     SpO2 06/28/16 1908 99 %     Weight 06/28/16 1908 197 lb (89.4 kg)     Height 06/28/16 1908 5' 6"  (1.676 m)     Head Circumference --      Peak Flow --      Pain Score 06/28/16 1907 10     Pain Loc --      Pain Edu? --      Excl. in [place]? --     Constitutional: Alert and oriented. Well appearing and in no acute distress. Eyes: Conjunctivae are normal. EOMI. Head: Atraumatic. Neck: No stridor.  Respiratory: Normal respiratory effort.   Musculoskeletal: Focal tenderness over the proximal aspect of the right humerus. No step off deformity of the shoulder. No bony tenderness of the elbow, forearm, wrist, or hand. No focal tenderness of the right hip or lower extremity.  Neurologic:  Normal speech and language. No gross focal neurologic deficits are appreciated. Speech is normal. No gait instability. Skin:  Skin is warm, dry and intact. Atraumatic. Psychiatric: Mood and affect are normal. Speech and behavior are normal.  ____________________________________________   LABS (all labs ordered are listed, but only abnormal results are displayed)  Labs Reviewed - No data to display ____________________________________________  RADIOLOGY  Per radiology, no evidence of fracture or dislocation of the right shoulder/humerus. ____________________________________________   PROCEDURES  Procedure(s)  performed: Sling applied by RN.   ____________________________________________   INITIAL IMPRESSION / [place] / ED COURSE  71 year old female presenting to the emergency department after sustaining a mechanical, non-syncopal fall while at work this evening. X-rays negative for acute bony abnormality of the right humerus or shoulder. Patient reports that the pain has lessened since the initial injury. She will be given a prescription for meloxicam and baclofen. She will be instructed to follow-up with the orthopedist for symptoms that are not improving over the week. She was instructed to return to the emergency department for symptoms that change or worsen if she is unable to schedule an appointment.  Pertinent labs & imaging results that were available during my care of  the patient were reviewed by me and considered in my medical decision making (see chart for details).  _________________________________________   FINAL CLINICAL IMPRESSION(S) / ED DIAGNOSES  Final diagnoses:  Right arm pain    New Prescriptions   BACLOFEN (LIORESAL) 10 MG TABLET    Take 1 tablet (10 mg total) by mouth 3 (three) times daily.   MELOXICAM (MOBIC) 15 MG TABLET    Take 1 tablet (15 mg total) by mouth daily.    If controlled substance prescribed during this visit, 12 month history viewed on the [place] prior to issuing an initial prescription for Schedule II or III opiod.    Victorino Dike, FNP 06/28/16 2047    Darel Hong, MD 06/29/16 [phone]

## 2016-06-28 NOTE — ED Triage Notes (Signed)
Patient c/o right upper arm pain post fall at work.

## 2016-06-28 NOTE — ED Notes (Signed)
Spoke with [employer] (supervisor) at [phone] and she states that no WC drug screening is needed.

## 2016-06-28 NOTE — ED Notes (Signed)
Pt has rt arm pain - fell at work. Unable to lift arm away from body. xr ordered in triage.

## 2016-07-08 ENCOUNTER — Telehealth: Payer: Self-pay | Admitting: Podiatry

## 2016-07-08 NOTE — Telephone Encounter (Signed)
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

## 2016-07-08 NOTE — Telephone Encounter (Signed)
Left message that it would be okay to discuss fungal culture results with Dr. Stacie Acres at her 08/29/2016 appt.

## 2016-07-09 ENCOUNTER — Ambulatory Visit: Payer: Medicare Other | Admitting: Podiatry

## 2016-09-04 ENCOUNTER — Telehealth: Payer: Self-pay

## 2016-09-04 MED ORDER — ROSUVASTATIN CALCIUM 10 MG PO TABS: 10.0000 mg | ORAL_TABLET | Freq: Every day | ORAL | 1 refills | Status: DC

## 2016-09-04 NOTE — Telephone Encounter (Signed)
Please send 90 day supply for Rosuvastatin 10mg   1 tablet once daily

## 2016-09-08 ENCOUNTER — Ambulatory Visit: Payer: Medicare Other | Admitting: Podiatry

## 2016-09-10 ENCOUNTER — Ambulatory Visit (INDEPENDENT_AMBULATORY_CARE_PROVIDER_SITE_OTHER): Payer: Medicare Other | Admitting: Family Medicine

## 2016-09-10 ENCOUNTER — Encounter: Payer: Self-pay | Admitting: Family Medicine

## 2016-09-10 VITALS — BP 136/82 | HR 80 | Temp 98.6°F | Wt 198.0 lb

## 2016-09-10 DIAGNOSIS — N182 Chronic kidney disease, stage 2 (mild): Secondary | ICD-10-CM | POA: Diagnosis not present

## 2016-09-10 DIAGNOSIS — E1122 Type 2 diabetes mellitus with diabetic chronic kidney disease: Secondary | ICD-10-CM

## 2016-09-10 DIAGNOSIS — E119 Type 2 diabetes mellitus without complications: Secondary | ICD-10-CM | POA: Diagnosis not present

## 2016-09-10 LAB — BAYER DCA HB A1C WAIVED: HB A1C (BAYER DCA - WAIVED): 7.2 % — ABNORMAL HIGH (ref <7.0)

## 2016-09-10 NOTE — Progress Notes (Signed)
BP 136/82 (BP Location: Left Arm, Patient Position: Sitting, Cuff Size: Large)   Pulse 80   Temp 98.6 F (37 C)   Wt 198 lb (89.8 kg)   SpO2 99%   BMI 31.96 kg/m    Subjective:    Patient ID: Alison Hernandez, female    DOB: 1945-07-25, 71 y.o.   MRN: [id number]  HPI: Alison Hernandez is a 71 y.o. female  Chief Complaint  Patient presents with  . Diabetes   DIABETES Hypoglycemic episodes:no Polydipsia/polyuria: no Visual disturbance: no Chest pain: no Paresthesias: no Glucose Monitoring: yes  Accucheck frequency: Daily  Post prandial: 330- 2 hours after she ate Taking Insulin?: no Blood Pressure Monitoring: not checking Retinal Examination: Not up to Date Foot Exam: Up to Date Diabetic Education: Completed Pneumovax: Up to Date Influenza: Up to Date Aspirin: no  Fell at work and tore a muscle off the bone in her arm. Following with ortho. Likely to have surgery in August  Relevant past medical, surgical, family and social history reviewed and updated as indicated. Interim medical history since our last visit reviewed. Allergies and medications reviewed and updated.  Review of Systems  Constitutional: Negative.   Respiratory: Negative.   Cardiovascular: Negative.   Psychiatric/Behavioral: Negative.     Per HPI unless specifically indicated above     Objective:    BP 136/82 (BP Location: Left Arm, Patient Position: Sitting, Cuff Size: Large)   Pulse 80   Temp 98.6 F (37 C)   Wt 198 lb (89.8 kg)   SpO2 99%   BMI 31.96 kg/m   Wt Readings from Last 3 Encounters:  09/10/16 198 lb (89.8 kg)  06/28/16 197 lb (89.4 kg)  06/04/16 197 lb (89.4 kg)    Physical Exam  Constitutional: She is oriented to person, place, and time. She appears well-developed and well-nourished. No distress.  HENT:  Head: Normocephalic and atraumatic.  Right Ear: Hearing normal.  Left Ear: Hearing normal.  Nose: Nose normal.  Eyes: Conjunctivae and lids are normal. Right eye  exhibits no discharge. Left eye exhibits no discharge. No scleral icterus.  Cardiovascular: Normal rate, regular rhythm, normal heart sounds and intact distal pulses.  Exam reveals no gallop and no friction rub.   No murmur heard. Pulmonary/Chest: Effort normal and breath sounds normal. No respiratory distress. She has no wheezes. She has no rales. She exhibits no tenderness.  Musculoskeletal: Normal range of motion.  Neurological: She is alert and oriented to person, place, and time.  Skin: Skin is warm, dry and intact. No rash noted. She is not diaphoretic. No erythema. No pallor.  Psychiatric: She has a normal mood and affect. Her speech is normal and behavior is normal. Judgment and thought content normal. Cognition and memory are normal.  Nursing note and vitals reviewed.   Results for orders placed or performed in visit on 06/04/16  Bayer DCA Hb A1c Waived  Result Value Ref Range   Bayer DCA Hb A1c Waived 7.4 (H) <7.0 %  Comprehensive metabolic panel  Result Value Ref Range   Glucose 108 (H) 65 - 99 mg/dL   BUN 22 8 - 27 mg/dL   Creatinine, Ser 0.86 (H) 0.57 - 1.00 mg/dL   GFR calc non Af Amer 49 (L) >59 mL/min/1.73   GFR calc Af Amer 56 (L) >59 mL/min/1.73   BUN/Creatinine Ratio 19 12 - 28   Sodium 137 134 - 144 mmol/L   Potassium 4.5 3.5 - 5.2 mmol/L  Chloride 98 96 - 106 mmol/L   CO2 24 18 - 29 mmol/L   Calcium 9.3 8.7 - 10.3 mg/dL   Total Protein 7.0 6.0 - 8.5 g/dL   Albumin 4.3 3.6 - 4.8 g/dL   Globulin, Total 2.7 1.5 - 4.5 g/dL   Albumin/Globulin Ratio 1.6 1.2 - 2.2   Bilirubin Total 0.3 0.0 - 1.2 mg/dL   Alkaline Phosphatase 100 39 - 117 IU/L   AST 21 0 - 40 IU/L   ALT 11 0 - 32 IU/L  Lipid Panel w/o Chol/HDL Ratio  Result Value Ref Range   Cholesterol, Total 222 (H) 100 - 199 mg/dL   Triglycerides 253155 (H) 0 - 149 mg/dL   HDL 55 >66>39 mg/dL   VLDL Cholesterol Cal 31 5 - 40 mg/dL   LDL Calculated 440136 (H) 0 - 99 mg/dL  Microalbumin, Urine Waived  Result Value Ref  Range   Microalb, Ur Waived 30 (H) 0 - 19 mg/L   Creatinine, Urine Waived 100 10 - 300 mg/dL   Microalb/Creat Ratio <30 <30 mg/g  CBC with Differential/Platelet  Result Value Ref Range   WBC 6.9 3.4 - 10.8 x10E3/uL   RBC 3.68 (L) 3.77 - 5.28 x10E6/uL   Hemoglobin 10.7 (L) 11.1 - 15.9 g/dL   Hematocrit 34.731.7 (L) 42.534.0 - 46.6 %   MCV 86 79 - 97 fL   MCH 29.1 26.6 - 33.0 pg   MCHC 33.8 31.5 - 35.7 g/dL   RDW 95.614.3 38.712.3 - 56.415.4 %   Platelets 297 150 - 379 x10E3/uL   Neutrophils 65 Not Estab. %   Lymphs 26 Not Estab. %   Monocytes 7 Not Estab. %   Eos 2 Not Estab. %   Basos 0 Not Estab. %   Neutrophils Absolute 4.4 1.4 - 7.0 x10E3/uL   Lymphocytes Absolute 1.8 0.7 - 3.1 x10E3/uL   Monocytes Absolute 0.5 0.1 - 0.9 x10E3/uL   EOS (ABSOLUTE) 0.2 0.0 - 0.4 x10E3/uL   Basophils Absolute 0.0 0.0 - 0.2 x10E3/uL   Immature Granulocytes 0 Not Estab. %   Immature Grans (Abs) 0.0 0.0 - 0.1 x10E3/uL      Assessment & Plan:   Problem List Items Addressed This Visit      Endocrine   Diabetes (HCC) - Primary    A1c down to 7.2. Will continue current regimen. Continue current regimen. Call with any concerns. Recheck 3 months.       Relevant Orders   Bayer DCA Hb A1c Waived       Follow up plan: Return in about 3 months (around 12/11/2016).

## 2016-09-10 NOTE — Patient Instructions (Addendum)

## 2016-09-10 NOTE — Assessment & Plan Note (Signed)
A1c down to 7.2. Will continue current regimen. Continue current regimen. Call with any concerns. Recheck 3 months.

## 2016-09-22 ENCOUNTER — Ambulatory Visit: Payer: Medicare Other | Admitting: Podiatry

## 2016-09-29 ENCOUNTER — Ambulatory Visit: Payer: Medicare Other | Admitting: Podiatry

## 2016-09-30 HISTORY — PX: ROTATOR CUFF REPAIR: SHX139

## 2016-10-30 ENCOUNTER — Other Ambulatory Visit: Payer: Self-pay | Admitting: Family Medicine

## 2016-11-06 ENCOUNTER — Ambulatory Visit (INDEPENDENT_AMBULATORY_CARE_PROVIDER_SITE_OTHER): Payer: Medicare Other

## 2016-11-06 VITALS — BP 132/77 | HR 82 | Temp 97.9°F | Resp 16 | Ht 66.0 in | Wt 197.5 lb

## 2016-11-06 DIAGNOSIS — Z0000 Encounter for general adult medical examination without abnormal findings: Secondary | ICD-10-CM

## 2016-11-06 DIAGNOSIS — Z23 Encounter for immunization: Secondary | ICD-10-CM

## 2016-11-06 NOTE — Progress Notes (Signed)
Subjective:   Alison Hernandez is a 71 y.o. female who presents for Medicare Annual (Subsequent) preventive examination.  Review of Systems:  Cardiac Risk Factors include: obesity (BMI >30kg/m2);advanced age (>69mn, >>75women);diabetes mellitus;dyslipidemia;hypertension     Objective:     Vitals: BP 132/77 (BP Location: Left Arm, Patient Position: Sitting)   Pulse 82   Temp 97.9 F (36.6 C)   Resp 16   Ht 5' 6" (1.676 m)   Wt 197 lb 8 oz (89.6 kg)   BMI 31.88 kg/m   Body mass index is 31.88 kg/m.   Tobacco History  Smoking Status  . Never Smoker  Smokeless Tobacco  . Never Used     Counseling given: Not Answered   Past Medical History:  Diagnosis Date  . Diabetes mellitus without complication ([place]   . Hyperlipidemia   . Hypertension    Past Surgical History:  Procedure Laterality Date  . ABDOMINAL HYSTERECTOMY     total  . BREAST BIOPSY Left 20+ yrs ago   benign  . BREAST BIOPSY Left 20+ yrs ago   benign  . ROTATOR CUFF REPAIR Right 09/30/2016  . SIGMOIDOSCOPY  1990   Family History  Problem Relation Age of Onset  . Diabetes Mother   . Heart disease Mother   . Cancer Father        esophagus/ lung  . Diabetes Maternal Grandmother   . Cancer Sister        Ovarian   History  Sexual Activity  . Sexual activity: Not Currently  . Birth control/ protection: None    Outpatient Encounter Prescriptions as of 11/06/2016  Medication Sig  . baclofen (LIORESAL) 10 MG tablet Take 1 tablet (10 mg total) by mouth 3 (three) times daily.  . Blood Glucose Monitoring Suppl (ONE TOUCH ULTRA 2) w/Device KIT   . buPROPion (WELLBUTRIN SR) 150 MG 12 hr tablet Take 1 tablet (150 mg total) by mouth 2 (two) times daily. 1 daily for 1 week, then 1 BID  . ferrous sulfate (FERROUSUL) 325 (65 FE) MG tablet Take 1 tablet (325 mg total) by mouth 2 (two) times daily with a meal.  . glipiZIDE (GLUCOTROL) 5 MG tablet TAKE ONE TABLET BY MOUTH ONCE DAILY BEFORE BREAKFAST  .  lisinopril-hydrochlorothiazide (PRINZIDE,ZESTORETIC) 20-25 MG tablet Take 2 tablets by mouth daily.  . meclizine (ANTIVERT) 25 MG tablet Take 1 tablet (25 mg total) by mouth 3 (three) times daily as needed for dizziness.  . metFORMIN (GLUCOPHAGE) 1000 MG tablet Take 1 tablet (1,000 mg total) by mouth 2 (two) times daily with a meal.  . Multiple Vitamin (MULTIVITAMIN) tablet Take 1 tablet by mouth daily.  .Marland Kitchennystatin (MYCOSTATIN/NYSTOP) powder Apply topically 4 (four) times daily.  .Marland Kitchenomeprazole (PRILOSEC) 20 MG capsule Take 1 capsule (20 mg total) by mouth daily.  . ondansetron (ZOFRAN-ODT) 4 MG disintegrating tablet DISSOLVE ONE TABLET IN MOUTH EVERY 8 HOURS AS NEEDED FOR NAUSEA OR VOMITING  . rosuvastatin (CRESTOR) 10 MG tablet Take 1 tablet (10 mg total) by mouth daily.  . sitaGLIPtin (JANUVIA) 100 MG tablet Take 1 tablet (100 mg total) by mouth daily.  . ONE TOUCH ULTRA TEST test strip   . ONETOUCH DELICA LANCETS 314EMISC    No facility-administered encounter medications on file as of 11/06/2016.     Activities of Daily Living In your present state of health, do you have any difficulty performing the following activities: 11/06/2016 09/10/2016  Hearing? N N  Vision? N N  Difficulty concentrating or making decisions? N N  Walking or climbing stairs? N N  Dressing or bathing? N N  Doing errands, shopping? N N  Preparing Food and eating ? N -  Using the Toilet? N -  In the past six months, have you accidently leaked urine? Y -  Comment pad -  Do you have problems with loss of bowel control? N -  Managing your Medications? N -  Managing your Finances? N -  Housekeeping or managing your Housekeeping? N -  Some recent data might be hidden    Patient Care Team: Valerie Roys, DO as PCP - General (Family Medicine) Tania Ade, MD as Consulting Physician (Orthopedic Surgery) Garrel Ridgel, [place] as Consulting Physician (Podiatry)    Assessment:     Exercise Activities and Dietary  recommendations Exercise limited by: orthopedic condition(s) (recovering from rotator cuff sx)  Goals    None     Fall Risk Fall Risk  11/06/2016 06/04/2016 08/29/2015  Falls in the past year? Yes No No  Number falls in past yr: 1 - -  Injury with Fall? Yes - -  Follow up Falls prevention discussed - -   Depression Screen PHQ 2/9 Scores 11/06/2016 06/04/2016 10/25/2015 08/29/2015  PHQ - 2 Score 0 0 0 0  PHQ- 9 Score - - 0 -     Cognitive Function     6CIT Screen 11/06/2016  What Year? 0 points  What month? 0 points  What time? 0 points  Count back from 20 0 points  Months in reverse 0 points  Repeat phrase 0 points  Total Score 0    Immunization History  Administered Date(s) Administered  . Influenza, High Dose Seasonal PF 11/28/2015  . Influenza,inj,Quad PF,36+ Mos 03/28/2015  . Pneumococcal Conjugate-13 08/29/2015  . Pneumococcal Polysaccharide-23 03/24/1997, 11/06/2016  . Td 05/12/2008   Screening Tests Health Maintenance  Topic Date Due  . INFLUENZA VACCINE  10/22/2016  . MAMMOGRAM  12/10/2016 (Originally 09/19/2016)  . OPHTHALMOLOGY EXAM  12/10/2016 (Originally 04/02/1955)  . COLONOSCOPY  12/10/2016 (Originally 04/02/1997)  . DEXA SCAN  12/11/2016 (Originally 04/02/2012)  . HEMOGLOBIN A1C  03/12/2017  . FOOT EXAM  04/16/2017  . TETANUS/TDAP  05/12/2018  . Hepatitis C Screening  Completed  . PNA vac Low Risk Adult  Completed      Plan:     I have personally reviewed and addressed the Medicare Annual Wellness questionnaire and have noted the following in the patient's chart:  A. Medical and social history B. Use of alcohol, tobacco or illicit drugs  C. Current medications and supplements D. Functional ability and status E.  Nutritional status F.  Physical activity G. Advance directives H. List of other physicians I.  Hospitalizations, surgeries, and ER visits in previous 12 months J.  [place] such as hearing and vision if needed, cognitive and  depression L. Referrals and appointments   In addition, I have reviewed and discussed with patient certain preventive protocols, quality metrics, and best practice recommendations. A written personalized care plan for preventive services as well as general preventive health recommendations were provided to patient.   Signed,  Tyler Aas, LPN Nurse Health Advisor   MD Recommendations:

## 2016-11-06 NOTE — Patient Instructions (Addendum)
Alison Hernandez , Thank you for taking time to come for your Medicare Wellness Visit. I appreciate your ongoing commitment to your health goals. Please review the following plan we discussed and let me know if I can assist you in the future.   Screening recommendations/referrals: Colonoscopy: send cologuard in when you can Mammogram: call to schedule  Bone Density: call to schedule Recommended yearly ophthalmology/optometry visit for glaucoma screening and checkup Recommended yearly dental visit for hygiene and checkup  Vaccinations: Influenza vaccine: up to date, due 11/2016 Pneumococcal vaccine: pneumovax 23 due Tdap vaccine: up to date Shingles vaccine: due, check with your insurance company for coverage    Advanced directives: Advance directive discussed with you today. I have provided a copy for you to complete at home and have notarized. Once this is complete please bring a copy in to our office so we can scan it into your chart.  Conditions/risks identified: none   Next appointment: Follow up on 12/10/2016 at 10:00am with Dr.Johnson. Follow up in one year for your annual wellness exam   Preventive Care 65 Years and Older, Female Preventive care refers to lifestyle choices and visits with your health care provider that can promote health and wellness. What does preventive care include?  A yearly physical exam. This is also called an annual well check.  Dental exams once or twice a year.  Routine eye exams. Ask your health care provider how often you should have your eyes checked.  Personal lifestyle choices, including:  Daily care of your teeth and gums.  Regular physical activity.  Eating a healthy diet.  Avoiding tobacco and drug use.  Limiting alcohol use.  Practicing safe sex.  Taking low-dose aspirin every day.  Taking vitamin and mineral supplements as recommended by your health care provider. What happens during an annual well check? The services and  screenings done by your health care provider during your annual well check will depend on your age, overall health, lifestyle risk factors, and family history of disease. Counseling  Your health care provider may ask you questions about your:  Alcohol use.  Tobacco use.  Drug use.  Emotional well-being.  Home and relationship well-being.  Sexual activity.  Eating habits.  History of falls.  Memory and ability to understand (cognition).  Work and work Astronomerenvironment.  Reproductive health. Screening  You may have the following tests or measurements:  Height, weight, and BMI.  Blood pressure.  Lipid and cholesterol levels. These may be checked every 5 years, or more frequently if you are over 15100 years old.  Skin check.  Lung cancer screening. You may have this screening every year starting at age 69 if you have a 30-pack-year history of smoking and currently smoke or have quit within the past 15 years.  Fecal occult blood test (FOBT) of the stool. You may have this test every year starting at age 69.  Flexible sigmoidoscopy or colonoscopy. You may have a sigmoidoscopy every 5 years or a colonoscopy every 10 years starting at age 10150.  Hepatitis C blood test.  Hepatitis B blood test.  Sexually transmitted disease (STD) testing.  Diabetes screening. This is done by checking your blood sugar (glucose) after you have not eaten for a while (fasting). You may have this done every 1-3 years.  Bone density scan. This is done to screen for osteoporosis. You may have this done starting at age 69.  Mammogram. This may be done every 1-2 years. Talk to your health care provider about  how often you should have regular mammograms. Talk with your health care provider about your test results, treatment options, and if necessary, the need for more tests. Vaccines  Your health care provider may recommend certain vaccines, such as:  Influenza vaccine. This is recommended every  year.  Tetanus, diphtheria, and acellular pertussis (Tdap, Td) vaccine. You may need a Td booster every 10 years.  Zoster vaccine. You may need this after age 34.  Pneumococcal 13-valent conjugate (PCV13) vaccine. One dose is recommended after age 106.  Pneumococcal polysaccharide (PPSV23) vaccine. One dose is recommended after age 33. Talk to your health care provider about which screenings and vaccines you need and how often you need them. This information is not intended to replace advice given to you by your health care provider. Make sure you discuss any questions you have with your health care provider. Document Released: 04/06/2015 Document Revised: 11/28/2015 Document Reviewed: 01/09/2015 Elsevier Interactive Patient Education  2017 [place] Prevention in the Home Falls can cause injuries. They can happen to people of all ages. There are many things you can do to make your home safe and to help prevent falls. What can I do on the outside of my home?  Regularly fix the edges of walkways and driveways and fix any cracks.  Remove anything that might make you trip as you walk through a door, such as a raised step or threshold.  Trim any bushes or trees on the path to your home.  Use bright outdoor lighting.  Clear any walking paths of anything that might make someone trip, such as rocks or tools.  Regularly check to see if handrails are loose or broken. Make sure that both sides of any steps have handrails.  Any raised decks and porches should have guardrails on the edges.  Have any leaves, snow, or ice cleared regularly.  Use sand or salt on walking paths during winter.  Clean up any spills in your garage right away. This includes oil or grease spills. What can I do in the bathroom?  Use night lights.  Install grab bars by the toilet and in the tub and shower. Do not use towel bars as grab bars.  Use non-skid mats or decals in the tub or shower.  If you  need to sit down in the shower, use a plastic, non-slip stool.  Keep the floor dry. Clean up any water that spills on the floor as soon as it happens.  Remove soap buildup in the tub or shower regularly.  Attach bath mats securely with double-sided non-slip rug tape.  Do not have throw rugs and other things on the floor that can make you trip. What can I do in the bedroom?  Use night lights.  Make sure that you have a light by your bed that is easy to reach.  Do not use any sheets or blankets that are too big for your bed. They should not hang down onto the floor.  Have a firm chair that has side arms. You can use this for support while you get dressed.  Do not have throw rugs and other things on the floor that can make you trip. What can I do in the kitchen?  Clean up any spills right away.  Avoid walking on wet floors.  Keep items that you use a lot in easy-to-reach places.  If you need to reach something above you, use a strong step stool that has a grab bar.  Keep  electrical cords out of the way.  Do not use floor polish or wax that makes floors slippery. If you must use wax, use non-skid floor wax.  Do not have throw rugs and other things on the floor that can make you trip. What can I do with my stairs?  Do not leave any items on the stairs.  Make sure that there are handrails on both sides of the stairs and use them. Fix handrails that are broken or loose. Make sure that handrails are as long as the stairways.  Check any carpeting to make sure that it is firmly attached to the stairs. Fix any carpet that is loose or worn.  Avoid having throw rugs at the top or bottom of the stairs. If you do have throw rugs, attach them to the floor with carpet tape.  Make sure that you have a light switch at the top of the stairs and the bottom of the stairs. If you do not have them, ask someone to add them for you. What else can I do to help prevent falls?  Wear shoes  that:  Do not have high heels.  Have rubber bottoms.  Are comfortable and fit you well.  Are closed at the toe. Do not wear sandals.  If you use a stepladder:  Make sure that it is fully opened. Do not climb a closed stepladder.  Make sure that both sides of the stepladder are locked into place.  Ask someone to hold it for you, if possible.  Clearly mark and make sure that you can see:  Any grab bars or handrails.  First and last steps.  Where the edge of each step is.  Use tools that help you move around (mobility aids) if they are needed. These include:  Canes.  Walkers.  Scooters.  Crutches.  Turn on the lights when you go into a dark area. Replace any light bulbs as soon as they burn out.  Set up your furniture so you have a clear path. Avoid moving your furniture around.  If any of your floors are uneven, fix them.  If there are any pets around you, be aware of where they are.  Review your medicines with your doctor. Some medicines can make you feel dizzy. This can increase your chance of falling. Ask your doctor what other things that you can do to help prevent falls. This information is not intended to replace advice given to you by your health care provider. Make sure you discuss any questions you have with your health care provider. Document Released: 01/04/2009 Document Revised: 08/16/2015 Document Reviewed: 04/14/2014 Elsevier Interactive Patient Education  2017 [employer]   Pneumococcal Polysaccharide Vaccine: What You Need to Know 1. Why get vaccinated? Vaccination can protect older adults (and some children and younger adults) from pneumococcal disease. Pneumococcal disease is caused by bacteria that can spread from person to person through close contact. It can cause ear infections, and it can also lead to more serious infections of the:  Lungs (pneumonia),  Blood (bacteremia), and  Covering of the brain and spinal cord (meningitis).  Meningitis can cause deafness and brain damage, and it can be fatal.  Anyone can get pneumococcal disease, but children under 55 years of age, people with certain medical conditions, adults over 73 years of age, and cigarette smokers are at the highest risk. About 18,000 older adults die each year from pneumococcal disease in the [place]. Treatment of pneumococcal infections with penicillin and other  drugs used to be more effective. But some strains of the disease have become resistant to these drugs. This makes prevention of the disease, through vaccination, even more important. 2. Pneumococcal polysaccharide vaccine (PPSV23) Pneumococcal polysaccharide vaccine (PPSV23) protects against 23 types of pneumococcal bacteria. It will not prevent all pneumococcal disease. PPSV23 is recommended for:  All adults 42 years of age and older,  Anyone 2 through 68 years of age with certain long-term health problems,  Anyone 2 through 69 years of age with a weakened immune system,  Adults 87 through 69 years of age who smoke cigarettes or have asthma.  Most people need only one dose of PPSV. A second dose is recommended for certain high-risk groups. People 52 and older should get a dose even if they have gotten one or more doses of the vaccine before they turned 65. Your healthcare provider can give you more information about these recommendations. Most healthy adults develop protection within 2 to 3 weeks of getting the shot. 3. Some people should not get this vaccine  Anyone who has had a life-threatening allergic reaction to PPSV should not get another dose.  Anyone who has a severe allergy to any component of PPSV should not receive it. Tell your provider if you have any severe allergies.  Anyone who is moderately or severely ill when the shot is scheduled may be asked to wait until they recover before getting the vaccine. Someone with a mild illness can usually be vaccinated.  Children less  than 63 years of age should not receive this vaccine.  There is no evidence that PPSV is harmful to either a pregnant woman or to her fetus. However, as a precaution, women who need the vaccine should be vaccinated before becoming pregnant, if possible. 4. Risks of a vaccine reaction With any medicine, including vaccines, there is a chance of side effects. These are usually mild and go away on their own, but serious reactions are also possible. About half of people who get PPSV have mild side effects, such as redness or pain where the shot is given, which go away within about two days. Less than 1 out of 100 people develop a fever, muscle aches, or more severe local reactions. Problems that could happen after any vaccine:  People sometimes faint after a medical procedure, including vaccination. Sitting or lying down for about 15 minutes can help prevent fainting, and injuries caused by a fall. Tell your doctor if you feel dizzy, or have vision changes or ringing in the ears.  Some people get severe pain in the shoulder and have difficulty moving the arm where a shot was given. This happens very rarely.  Any medication can cause a severe allergic reaction. Such reactions from a vaccine are very rare, estimated at about 1 in a million doses, and would happen within a few minutes to a few hours after the vaccination. As with any medicine, there is a very remote chance of a vaccine causing a serious injury or death. The safety of vaccines is always being monitored. For more information, visit: http://floyd.org/ 5. What if there is a serious reaction? What should I look for? Look for anything that concerns you, such as signs of a severe allergic reaction, very high fever, or unusual behavior. Signs of a severe allergic reaction can include hives, swelling of the face and throat, difficulty breathing, a fast heartbeat, dizziness, and weakness. These would usually start a few minutes to a few  hours after the vaccination.  What should I do? If you think it is a severe allergic reaction or other emergency that can't wait, call 9-1-1 or get to the nearest hospital. Otherwise, call your doctor. Afterward, the reaction should be reported to the Vaccine Adverse Event Reporting System (VAERS). Your doctor might file this report, or you can do it yourself through the VAERS web site at www.vaers.SamedayNews.es, or by calling 610-611-2634. VAERS does not give medical advice. 6. How can I learn more?  Ask your doctor. He or she can give you the vaccine package insert or suggest other sources of information.  Call your local or state health department.  Contact the Centers for Disease Control and Prevention (CDC): ? Call 270 093 5021 (1-800-CDC-INFO) or ? Visit CDC's website at http://hunter.com/ CDC Pneumococcal Polysaccharide Vaccine VIS (07/15/13) This information is not intended to replace advice given to you by your health care provider. Make sure you discuss any questions you have with your health care provider. Document Released: 01/05/2006 Document Revised: 11/29/2015 Document Reviewed: 11/29/2015 Elsevier Interactive Patient Education  2017 [employer].

## 2016-12-10 ENCOUNTER — Encounter: Payer: Medicare Other | Admitting: Family Medicine

## 2016-12-26 ENCOUNTER — Other Ambulatory Visit: Payer: Self-pay | Admitting: Family Medicine

## 2017-03-10 ENCOUNTER — Other Ambulatory Visit: Payer: Self-pay | Admitting: Family Medicine

## 2017-03-30 ENCOUNTER — Other Ambulatory Visit: Payer: Self-pay | Admitting: Family Medicine

## 2017-04-08 ENCOUNTER — Ambulatory Visit (INDEPENDENT_AMBULATORY_CARE_PROVIDER_SITE_OTHER): Payer: Medicare Other | Admitting: Family Medicine

## 2017-04-08 ENCOUNTER — Encounter: Payer: Self-pay | Admitting: Family Medicine

## 2017-04-08 VITALS — BP 156/80 | HR 80 | Temp 98.8°F | Wt 195.4 lb

## 2017-04-08 DIAGNOSIS — N390 Urinary tract infection, site not specified: Secondary | ICD-10-CM | POA: Diagnosis not present

## 2017-04-08 DIAGNOSIS — N182 Chronic kidney disease, stage 2 (mild): Secondary | ICD-10-CM

## 2017-04-08 DIAGNOSIS — E1122 Type 2 diabetes mellitus with diabetic chronic kidney disease: Secondary | ICD-10-CM | POA: Diagnosis not present

## 2017-04-08 DIAGNOSIS — E785 Hyperlipidemia, unspecified: Secondary | ICD-10-CM | POA: Diagnosis not present

## 2017-04-08 DIAGNOSIS — F3341 Major depressive disorder, recurrent, in partial remission: Secondary | ICD-10-CM

## 2017-04-08 DIAGNOSIS — D649 Anemia, unspecified: Secondary | ICD-10-CM

## 2017-04-08 DIAGNOSIS — Z23 Encounter for immunization: Secondary | ICD-10-CM

## 2017-04-08 DIAGNOSIS — I129 Hypertensive chronic kidney disease with stage 1 through stage 4 chronic kidney disease, or unspecified chronic kidney disease: Secondary | ICD-10-CM

## 2017-04-08 LAB — BAYER DCA HB A1C WAIVED: HB A1C (BAYER DCA - WAIVED): 7.6 % — ABNORMAL HIGH (ref <7.0)

## 2017-04-08 NOTE — Progress Notes (Signed)
BP (!) 156/80 (BP Location: Left Arm, Cuff Size: Normal)   Pulse 80   Temp 98.8 F (37.1 C)   Wt 195 lb 7 oz (88.6 kg)   SpO2 100%   BMI 31.54 kg/m    Subjective:    Patient ID: Alison Hernandez, female    DOB: 03-23-1946, 72 y.o.   MRN: [id number]  HPI: Alison Hernandez is a 72 y.o. female  Chief Complaint  Patient presents with  . Diabetes    Patient states that the Januvia went up to $160.00 a month  . Hypertension  . Hyperlipidemia  . Dizziness    Refill on Meclizine    HYPERTENSION / HYPERLIPIDEMIA Satisfied with current treatment? no Duration of hypertension: chronic BP monitoring frequency: not checking BP medication side effects: no Past BP meds: lisinopril-hctz,  Duration of hyperlipidemia: chronic Cholesterol medication side effects: no Cholesterol supplements: none Past cholesterol medications: rosuvastatin (crestor) Medication compliance: excellent compliance Aspirin: yes Recent stressors: yes Recurrent headaches: no Visual changes: no Palpitations: no Dyspnea: no Chest pain: no Lower extremity edema: no Dizzy/lightheaded: no  DIABETES- has not been taking her [place] because of cost.  Hypoglycemic episodes:no Polydipsia/polyuria: no Visual disturbance: no Chest pain: no Paresthesias: no Glucose Monitoring: no Taking Insulin?: no Blood Pressure Monitoring: not checking Retinal Examination: Not up to Date Foot Exam: Up to Date Diabetic Education: Completed Pneumovax: Up to Date Influenza: Up to Date Aspirin: yes  DEPRESSION Mood status: controlled Satisfied with current treatment?: yes Symptom severity: mild  Duration of current treatment : chronic Side effects: no Medication compliance: excellent compliance Psychotherapy/counseling: no  Previous psychiatric medications: wellbutrin Depressed mood: no Anxious mood: no Anhedonia: no Significant weight loss or gain: no Insomnia: no  Fatigue: no Feelings of worthlessness or guilt:  no Impaired concentration/indecisiveness: no Suicidal ideations: no Hopelessness: no Crying spells: no Depression screen [facility] 2/9 04/08/2017 11/06/2016 06/04/2016 10/25/2015 08/29/2015  Decreased Interest 0 0 0 0 0  Down, Depressed, Hopeless 0 0 0 0 0  PHQ - 2 Score 0 0 0 0 0  Altered sleeping 0 - - 0 -  Tired, decreased energy 0 - - 0 -  Change in appetite 0 - - 0 -  Feeling bad or failure about yourself  0 - - 0 -  Trouble concentrating 0 - - 0 -  Moving slowly or fidgety/restless 0 - - 0 -  Suicidal thoughts 0 - - 0 -  PHQ-9 Score 0 - - 0 -  Difficult doing work/chores Not difficult at all - - - -    ANEMIA Anemia status: controlled Etiology of anemia: iron def Duration of anemia treatment: chronic  Compliance with treatment: excellent compliance Iron supplementation side effects: no Severity of anemia: mild Fatigue: no Decreased exercise tolerance: no  Dyspnea on exertion: no Palpitations: no Bleeding: no Pica: no   Relevant past medical, surgical, family and social history reviewed and updated as indicated. Interim medical history since our last visit reviewed. Allergies and medications reviewed and updated.  Review of Systems  Constitutional: Negative.   Respiratory: Negative.   Cardiovascular: Negative.   Genitourinary: Positive for frequency and urgency. Negative for decreased urine volume, difficulty urinating, dyspareunia, dysuria, enuresis, flank pain, genital sores, hematuria, menstrual problem, pelvic pain, vaginal bleeding, vaginal discharge and vaginal pain.  Musculoskeletal: Negative.   Psychiatric/Behavioral: Negative.     Per HPI unless specifically indicated above     Objective:    BP (!) 156/80 (BP Location: Left Arm, Cuff Size:  Normal)   Pulse 80   Temp 98.8 F (37.1 C)   Wt 195 lb 7 oz (88.6 kg)   SpO2 100%   BMI 31.54 kg/m   Wt Readings from Last 3 Encounters:  04/08/17 195 lb 7 oz (88.6 kg)  11/06/16 197 lb 8 oz (89.6 kg)  09/10/16 198 lb  (89.8 kg)    Physical Exam  Constitutional: She is oriented to person, place, and time. She appears well-developed and well-nourished. No distress.  HENT:  Head: Normocephalic and atraumatic.  Right Ear: Hearing normal.  Left Ear: Hearing normal.  Nose: Nose normal.  Eyes: Conjunctivae and lids are normal. Right eye exhibits no discharge. Left eye exhibits no discharge. No scleral icterus.  Cardiovascular: Normal rate, regular rhythm, normal heart sounds and intact distal pulses. Exam reveals no gallop and no friction rub.  No murmur heard. Pulmonary/Chest: Effort normal and breath sounds normal. No respiratory distress. She has no wheezes. She has no rales. She exhibits no tenderness.  Musculoskeletal: Normal range of motion.  Neurological: She is alert and oriented to person, place, and time.  Skin: Skin is warm, dry and intact. No rash noted. She is not diaphoretic. No erythema. No pallor.  Psychiatric: She has a normal mood and affect. Her speech is normal and behavior is normal. Judgment and thought content normal. Cognition and memory are normal.  Nursing note and vitals reviewed.   Results for orders placed or performed in visit on 09/10/16  Bayer DCA Hb A1c Waived  Result Value Ref Range   Bayer DCA Hb A1c Waived 7.2 (H) <7.0 %      Assessment & Plan:   Problem List Items Addressed This Visit      Endocrine   Diabetes (HCC) - Primary    Not under great control with A1c of 7.6. Will restart januvia and recheck 1 month. Call with any concerns.      Relevant Orders   Bayer DCA Hb A1c Waived   Comprehensive metabolic panel   Microalbumin, Urine Waived   TSH   UA/M w/rflx Culture, Routine     Genitourinary   Benign hypertensive renal disease    Better on recheck, but still running high. Will restart januvia and recheck 1 month.       Relevant Orders   Comprehensive metabolic panel   Microalbumin, Urine Waived   TSH   UA/M w/rflx Culture, Routine     Other    Hyperlipidemia    Under good control. Continue to monitor. Call with any concerns. Refills given today.      Relevant Orders   Comprehensive metabolic panel   Lipid Panel w/o Chol/HDL Ratio   TSH   UA/M w/rflx Culture, Routine   Depression    Under good control. Continue current regimen. Refills given today. Call with any concerns.       Relevant Orders   Comprehensive metabolic panel   TSH   UA/M w/rflx Culture, Routine   Anemia    Rechecking levels today. Await results. Call with any concerns.       Relevant Orders   CBC with Differential/Platelet   Comprehensive metabolic panel   TSH   UA/M w/rflx Culture, Routine    Other Visit Diagnoses    Immunization due       Flu shot given today.   Relevant Orders   Flu vaccine HIGH DOSE PF (Fluzone High dose) (Completed)       Follow up plan: Return in about 4 weeks (around  05/06/2017) for Recheck BP and diabetes tolerance.

## 2017-04-08 NOTE — Assessment & Plan Note (Signed)
Rechecking levels today. Await results. Call with any concerns.  

## 2017-04-08 NOTE — Assessment & Plan Note (Signed)
Better on recheck, but still running high. Will restart januvia and recheck 1 month.

## 2017-04-08 NOTE — Assessment & Plan Note (Signed)
Under good control. Continue to monitor. Call with any concerns. Refills given today. 

## 2017-04-08 NOTE — Assessment & Plan Note (Signed)
Not under great control with A1c of 7.6. Will restart januvia and recheck 1 month. Call with any concerns.

## 2017-04-08 NOTE — Assessment & Plan Note (Signed)
Under good control. Continue current regimen. Refills given today. Call with any concerns.  

## 2017-04-08 NOTE — Patient Instructions (Addendum)
[facility]  Address: [address], [place], [place] [address]  Phone: [phone] Please call and schedule mammogram and bone density scan  Influenza (Flu) Vaccine (Inactivated or Recombinant): What You Need to Know 1. Why get vaccinated? Influenza ("flu") is a contagious disease that spreads around the [place] every year, usually between October and May. Flu is caused by influenza viruses, and is spread mainly by coughing, sneezing, and close contact. Anyone can get flu. Flu strikes suddenly and can last several days. Symptoms vary by age, but can include:  fever/chills  sore throat  muscle aches  fatigue  cough  headache  runny or stuffy nose  Flu can also lead to pneumonia and blood infections, and cause diarrhea and seizures in children. If you have a medical condition, such as heart or lung disease, flu can make it worse. Flu is more dangerous for some people. Infants and young children, people 70 years of age and older, pregnant women, and people with certain health conditions or a weakened immune system are at greatest risk. Each year thousands of people in the [place] die from flu, and many more are hospitalized. Flu vaccine can:  keep you from getting flu,  make flu less severe if you do get it, and  keep you from spreading flu to your family and other people. 2. Inactivated and recombinant flu vaccines A dose of flu vaccine is recommended every flu season. Children 6 months through 258 years of age may need two doses during the same flu season. Everyone else needs only one dose each flu season. Some inactivated flu vaccines contain a very small amount of a mercury-based preservative called thimerosal. Studies have not shown thimerosal in vaccines to be harmful, but flu vaccines that do not contain thimerosal are available. There is no live flu virus in flu shots. They cannot cause the flu. There are many flu  viruses, and they are always changing. Each year a new flu vaccine is made to protect against three or four viruses that are likely to cause disease in the upcoming flu season. But even when the vaccine doesn't exactly match these viruses, it may still provide some protection. Flu vaccine cannot prevent:  flu that is caused by a virus not covered by the vaccine, or  illnesses that look like flu but are not.  It takes about 2 weeks for protection to develop after vaccination, and protection lasts through the flu season. 3. Some people should not get this vaccine Tell the person who is giving you the vaccine:  If you have any severe, life-threatening allergies. If you ever had a life-threatening allergic reaction after a dose of flu vaccine, or have a severe allergy to any part of this vaccine, you may be advised not to get vaccinated. Most, but not all, types of flu vaccine contain a small amount of egg protein.  If you ever had Guillain-Barr Syndrome (also called GBS). Some people with a history of GBS should not get this vaccine. This should be discussed with your doctor.  If you are not feeling well. It is usually okay to get flu vaccine when you have a mild illness, but you might be asked to come back when you feel better.  4. Risks of a vaccine reaction With any medicine, including vaccines, there is a chance of reactions. These are usually mild and go away on their own, but serious reactions are also possible. Most people who get a  flu shot do not have any problems with it. Minor problems following a flu shot include:  soreness, redness, or swelling where the shot was given  hoarseness  sore, red or itchy eyes  cough  fever  aches  headache  itching  fatigue  If these problems occur, they usually begin soon after the shot and last 1 or 2 days. More serious problems following a flu shot can include the following:  There may be a small increased risk of Guillain-Barre  Syndrome (GBS) after inactivated flu vaccine. This risk has been estimated at 1 or 2 additional cases per million people vaccinated. This is much lower than the risk of severe complications from flu, which can be prevented by flu vaccine.  Young children who get the flu shot along with pneumococcal vaccine (PCV13) and/or DTaP vaccine at the same time might be slightly more likely to have a seizure caused by fever. Ask your doctor for more information. Tell your doctor if a child who is getting flu vaccine has ever had a seizure.  Problems that could happen after any injected vaccine:  People sometimes faint after a medical procedure, including vaccination. Sitting or lying down for about 15 minutes can help prevent fainting, and injuries caused by a fall. Tell your doctor if you feel dizzy, or have vision changes or ringing in the ears.  Some people get severe pain in the shoulder and have difficulty moving the arm where a shot was given. This happens very rarely.  Any medication can cause a severe allergic reaction. Such reactions from a vaccine are very rare, estimated at about 1 in a million doses, and would happen within a few minutes to a few hours after the vaccination. As with any medicine, there is a very remote chance of a vaccine causing a serious injury or death. The safety of vaccines is always being monitored. For more information, visit: http://floyd.org/ 5. What if there is a serious reaction? What should I look for? Look for anything that concerns you, such as signs of a severe allergic reaction, very high fever, or unusual behavior. Signs of a severe allergic reaction can include hives, swelling of the face and throat, difficulty breathing, a fast heartbeat, dizziness, and weakness. These would start a few minutes to a few hours after the vaccination. What should I do?  If you think it is a severe allergic reaction or other emergency that can't wait, call 9-1-1 and get  the person to the nearest hospital. Otherwise, call your doctor.  Reactions should be reported to the Vaccine Adverse Event Reporting System (VAERS). Your doctor should file this report, or you can do it yourself through the VAERS web site at www.vaers.LAgents.no, or by calling 1-425-379-7192. ? VAERS does not give medical advice. 6. The National Vaccine Injury Compensation Program The [employer] Vaccine Injury Compensation Program (VICP) is a federal program that was created to compensate people who may have been injured by certain vaccines. Persons who believe they may have been injured by a vaccine can learn about the program and about filing a claim by calling 1-(870)874-4304 or visiting the VICP website at SpiritualWord.at. There is a time limit to file a claim for compensation. 7. How can I learn more?  Ask your healthcare provider. He or she can give you the vaccine package insert or suggest other sources of information.  Call your local or state health department.  Contact the Centers for Disease Control and Prevention (CDC): ? Call 802-545-6973 (1-800-CDC-INFO) or ?  Visit CDC's website at https://gibson.com/ Vaccine Information Statement, Inactivated Influenza Vaccine (10/28/2013) This information is not intended to replace advice given to you by your health care provider. Make sure you discuss any questions you have with your health care provider. Document Released: 01/02/2006 Document Revised: 11/29/2015 Document Reviewed: 11/29/2015 Elsevier Interactive Patient Education  2017 [employer].

## 2017-04-09 LAB — CBC WITH DIFFERENTIAL/PLATELET
BASOS ABS: 0 10*3/uL (ref 0.0–0.2)
BASOS: 0 %
EOS (ABSOLUTE): 0.2 10*3/uL (ref 0.0–0.4)
Eos: 3 %
HEMOGLOBIN: 11.1 g/dL (ref 11.1–15.9)
Hematocrit: 33.2 % — ABNORMAL LOW (ref 34.0–46.6)
Immature Grans (Abs): 0 10*3/uL (ref 0.0–0.1)
Immature Granulocytes: 0 %
LYMPHS: 29 %
Lymphocytes Absolute: 2.1 10*3/uL (ref 0.7–3.1)
MCH: 29.8 pg (ref 26.6–33.0)
MCHC: 33.4 g/dL (ref 31.5–35.7)
MCV: 89 fL (ref 79–97)
MONOCYTES: 6 %
Monocytes Absolute: 0.5 10*3/uL (ref 0.1–0.9)
NEUTROS ABS: 4.5 10*3/uL (ref 1.4–7.0)
Neutrophils: 62 %
Platelets: 255 10*3/uL (ref 150–379)
RBC: 3.73 x10E6/uL — ABNORMAL LOW (ref 3.77–5.28)
RDW: 13.5 % (ref 12.3–15.4)
WBC: 7.4 10*3/uL (ref 3.4–10.8)

## 2017-04-09 LAB — LIPID PANEL W/O CHOL/HDL RATIO
Cholesterol, Total: 150 mg/dL (ref 100–199)
HDL: 53 mg/dL (ref >39)
LDL CALC: 70 mg/dL (ref 0–99)
Triglycerides: 135 mg/dL (ref 0–149)
VLDL CHOLESTEROL CAL: 27 mg/dL (ref 5–40)

## 2017-04-09 LAB — COMPREHENSIVE METABOLIC PANEL
ALBUMIN: 4.4 g/dL (ref 3.5–4.8)
ALT: 13 IU/L (ref 0–32)
AST: 16 IU/L (ref 0–40)
Albumin/Globulin Ratio: 1.6 (ref 1.2–2.2)
Alkaline Phosphatase: 85 IU/L (ref 39–117)
BILIRUBIN TOTAL: 0.3 mg/dL (ref 0.0–1.2)
BUN / CREAT RATIO: 19 (ref 12–28)
BUN: 22 mg/dL (ref 8–27)
CHLORIDE: 100 mmol/L (ref 96–106)
CO2: 22 mmol/L (ref 20–29)
Calcium: 9.4 mg/dL (ref 8.7–10.3)
Creatinine, Ser: 1.18 mg/dL — ABNORMAL HIGH (ref 0.57–1.00)
GFR calc Af Amer: 54 mL/min/{1.73_m2} — ABNORMAL LOW (ref >59)
GFR calc non Af Amer: 47 mL/min/{1.73_m2} — ABNORMAL LOW (ref >59)
GLUCOSE: 194 mg/dL — AB (ref 65–99)
Globulin, Total: 2.8 g/dL (ref 1.5–4.5)
Potassium: 4.4 mmol/L (ref 3.5–5.2)
Sodium: 139 mmol/L (ref 134–144)
TOTAL PROTEIN: 7.2 g/dL (ref 6.0–8.5)

## 2017-04-09 LAB — TSH: TSH: 2.24 u[IU]/mL (ref 0.450–4.500)

## 2017-04-10 ENCOUNTER — Telehealth: Payer: Self-pay | Admitting: Family Medicine

## 2017-04-10 LAB — UA/M W/RFLX CULTURE, ROUTINE
BILIRUBIN UA: NEGATIVE
Glucose, UA: NEGATIVE
KETONES UA: NEGATIVE
Nitrite, UA: NEGATIVE
Specific Gravity, UA: 1.02 (ref 1.005–1.030)
UUROB: 1 mg/dL (ref 0.2–1.0)
pH, UA: 7 (ref 5.0–7.5)

## 2017-04-10 LAB — MICROALBUMIN, URINE WAIVED
Creatinine, Urine Waived: 200 mg/dL (ref 10–300)
Microalb, Ur Waived: 150 mg/L — ABNORMAL HIGH (ref 0–19)

## 2017-04-10 LAB — MICROSCOPIC EXAMINATION

## 2017-04-10 LAB — URINE CULTURE, REFLEX

## 2017-04-10 NOTE — Telephone Encounter (Signed)
Patient notified

## 2017-04-10 NOTE — Telephone Encounter (Signed)
Called and left a message for patient to return my call.  CRM created

## 2017-04-10 NOTE — Telephone Encounter (Signed)
Please let her know that her urine grew out a bacteria that doesn't usually cause a problem- is she having symptoms? If yes, I'll call her in some medicine. If not, she's good.

## 2017-05-06 ENCOUNTER — Encounter: Payer: Self-pay | Admitting: Family Medicine

## 2017-05-06 ENCOUNTER — Ambulatory Visit (INDEPENDENT_AMBULATORY_CARE_PROVIDER_SITE_OTHER): Payer: Medicare Other | Admitting: Family Medicine

## 2017-05-06 VITALS — BP 133/81 | HR 88 | Temp 98.6°F | Wt 194.4 lb

## 2017-05-06 DIAGNOSIS — N182 Chronic kidney disease, stage 2 (mild): Secondary | ICD-10-CM | POA: Diagnosis not present

## 2017-05-06 DIAGNOSIS — I129 Hypertensive chronic kidney disease with stage 1 through stage 4 chronic kidney disease, or unspecified chronic kidney disease: Secondary | ICD-10-CM | POA: Diagnosis not present

## 2017-05-06 DIAGNOSIS — E1122 Type 2 diabetes mellitus with diabetic chronic kidney disease: Secondary | ICD-10-CM

## 2017-05-06 MED ORDER — ONETOUCH DELICA LANCETS 33G MISC: 12 refills | Status: DC

## 2017-05-06 MED ORDER — ONETOUCH ULTRA BLUE VI STRP: ORAL_STRIP | 12 refills | Status: DC

## 2017-05-06 MED ORDER — OMEPRAZOLE 20 MG PO CPDR: 20.0000 mg | DELAYED_RELEASE_CAPSULE | Freq: Every day | ORAL | 3 refills | Status: DC

## 2017-05-06 MED ORDER — MECLIZINE HCL 25 MG PO TABS: 25.0000 mg | ORAL_TABLET | Freq: Three times a day (TID) | ORAL | 0 refills | Status: DC | PRN

## 2017-05-06 MED ORDER — BUPROPION HCL ER (SR) 150 MG PO TB12: 150.0000 mg | ORAL_TABLET | Freq: Two times a day (BID) | ORAL | 1 refills | Status: DC

## 2017-05-06 MED ORDER — ONETOUCH ULTRA 2 W/DEVICE KIT: PACK | 12 refills | Status: DC

## 2017-05-06 MED ORDER — GLIPIZIDE 5 MG PO TABS
ORAL_TABLET | ORAL | 1 refills | Status: DC
Start: 2017-05-06 — End: 2017-11-18

## 2017-05-06 MED ORDER — ROSUVASTATIN CALCIUM 10 MG PO TABS: 10.0000 mg | ORAL_TABLET | Freq: Every day | ORAL | 1 refills | Status: DC

## 2017-05-06 MED ORDER — LISINOPRIL-HYDROCHLOROTHIAZIDE 20-25 MG PO TABS: 2.0000 | ORAL_TABLET | Freq: Every day | ORAL | 1 refills | Status: DC

## 2017-05-06 NOTE — Progress Notes (Signed)
BP 133/81 (BP Location: Left Arm, Patient Position: Sitting, Cuff Size: Normal)   Pulse 88   Temp 98.6 F (37 C)   Wt 194 lb 6 oz (88.2 kg)   SpO2 100%   BMI 31.37 kg/m    Subjective:    Patient ID: Alison Hernandez, female    DOB: 27-May-1945, 72 y.o.   MRN: [id number]  HPI: Alison Hernandez is a 72 y.o. female  Chief Complaint  Patient presents with  . Hypertension  . Diabetes   HYPERTENSION Hypertension status: better  Satisfied with current treatment? yes Duration of hypertension: chronic BP monitoring frequency:  not checking BP range:  BP medication side effects:  no Medication compliance: excellent compliance Previous BP meds: lisinopril-HCTZ Aspirin: yes Recurrent headaches: no Visual changes: no Palpitations: no Dyspnea: no Chest pain: no Lower extremity edema: no Dizzy/lightheaded: no  DIABETES- sugar 433 without taking her medicine, took her medicine, and it dropped down to 170 within an hour, not taking her medicine as often Hypoglycemic episodes:no Polydipsia/polyuria: no Visual disturbance: no Chest pain: no Paresthesias: no- better Glucose Monitoring: yes  Accucheck frequency: TID  Fasting glucose: Taking Insulin?: no Blood Pressure Monitoring: not checking Retinal Examination: Not up to Date Foot Exam: Up to Date Diabetic Education: Completed Pneumovax: Up to Date Influenza: Up to Date Aspirin: yes  Relevant past medical, surgical, family and social history reviewed and updated as indicated. Interim medical history since our last visit reviewed. Allergies and medications reviewed and updated.  Review of Systems  Constitutional: Negative.   Respiratory: Negative.   Cardiovascular: Negative.   Neurological: Negative.   Psychiatric/Behavioral: Negative.     Per HPI unless specifically indicated above     Objective:    BP 133/81 (BP Location: Left Arm, Patient Position: Sitting, Cuff Size: Normal)   Pulse 88   Temp 98.6 F (37 C)    Wt 194 lb 6 oz (88.2 kg)   SpO2 100%   BMI 31.37 kg/m   Wt Readings from Last 3 Encounters:  05/06/17 194 lb 6 oz (88.2 kg)  04/08/17 195 lb 7 oz (88.6 kg)  11/06/16 197 lb 8 oz (89.6 kg)    Physical Exam  Constitutional: She is oriented to person, place, and time. She appears well-developed and well-nourished. No distress.  HENT:  Head: Normocephalic and atraumatic.  Right Ear: Hearing normal.  Left Ear: Hearing normal.  Nose: Nose normal.  Eyes: Conjunctivae and lids are normal. Right eye exhibits no discharge. Left eye exhibits no discharge. No scleral icterus.  Cardiovascular: Normal rate, regular rhythm, normal heart sounds and intact distal pulses. Exam reveals no gallop and no friction rub.  No murmur heard. Pulmonary/Chest: Effort normal and breath sounds normal. No respiratory distress. She has no wheezes. She has no rales. She exhibits no tenderness.  Musculoskeletal: Normal range of motion.  Neurological: She is alert and oriented to person, place, and time.  Skin: Skin is warm, dry and intact. No rash noted. She is not diaphoretic. No erythema. No pallor.  Psychiatric: She has a normal mood and affect. Her speech is normal and behavior is normal. Judgment and thought content normal. Cognition and memory are normal.  Nursing note and vitals reviewed.   Results for orders placed or performed in visit on 04/08/17  Microscopic Examination  Result Value Ref Range   WBC, UA 6-10 (A) 0 - 5 /hpf   RBC, UA 0-2 0 - 2 /hpf   Epithelial Cells (non renal) 0-10 0 -  10 /hpf   Bacteria, UA Few None seen/Few  Urine Culture, Reflex  Result Value Ref Range   Urine Culture, Routine Final report (A)    Organism ID, Bacteria Comment (A)   Bayer DCA Hb A1c Waived  Result Value Ref Range   Bayer DCA Hb A1c Waived 7.6 (H) <7.0 %  CBC with Differential/Platelet  Result Value Ref Range   WBC 7.4 3.4 - 10.8 x10E3/uL   RBC 3.73 (L) 3.77 - 5.28 x10E6/uL   Hemoglobin 11.1 11.1 - 15.9 g/dL    Hematocrit 44.0 (L) 34.0 - 46.6 %   MCV 89 79 - 97 fL   MCH 29.8 26.6 - 33.0 pg   MCHC 33.4 31.5 - 35.7 g/dL   RDW 34.7 42.5 - 95.6 %   Platelets 255 150 - 379 x10E3/uL   Neutrophils 62 Not Estab. %   Lymphs 29 Not Estab. %   Monocytes 6 Not Estab. %   Eos 3 Not Estab. %   Basos 0 Not Estab. %   Neutrophils Absolute 4.5 1.4 - 7.0 x10E3/uL   Lymphocytes Absolute 2.1 0.7 - 3.1 x10E3/uL   Monocytes Absolute 0.5 0.1 - 0.9 x10E3/uL   EOS (ABSOLUTE) 0.2 0.0 - 0.4 x10E3/uL   Basophils Absolute 0.0 0.0 - 0.2 x10E3/uL   Immature Granulocytes 0 Not Estab. %   Immature Grans (Abs) 0.0 0.0 - 0.1 x10E3/uL  Comprehensive metabolic panel  Result Value Ref Range   Glucose 194 (H) 65 - 99 mg/dL   BUN 22 8 - 27 mg/dL   Creatinine, Ser 3.87 (H) 0.57 - 1.00 mg/dL   GFR calc non Af Amer 47 (L) >59 mL/min/1.73   GFR calc Af Amer 54 (L) >59 mL/min/1.73   BUN/Creatinine Ratio 19 12 - 28   Sodium 139 134 - 144 mmol/L   Potassium 4.4 3.5 - 5.2 mmol/L   Chloride 100 96 - 106 mmol/L   CO2 22 20 - 29 mmol/L   Calcium 9.4 8.7 - 10.3 mg/dL   Total Protein 7.2 6.0 - 8.5 g/dL   Albumin 4.4 3.5 - 4.8 g/dL   Globulin, Total 2.8 1.5 - 4.5 g/dL   Albumin/Globulin Ratio 1.6 1.2 - 2.2   Bilirubin Total 0.3 0.0 - 1.2 mg/dL   Alkaline Phosphatase 85 39 - 117 IU/L   AST 16 0 - 40 IU/L   ALT 13 0 - 32 IU/L  Lipid Panel w/o Chol/HDL Ratio  Result Value Ref Range   Cholesterol, Total 150 100 - 199 mg/dL   Triglycerides 564 0 - 149 mg/dL   HDL 53 >33 mg/dL   VLDL Cholesterol Cal 27 5 - 40 mg/dL   LDL Calculated 70 0 - 99 mg/dL  Microalbumin, Urine Waived  Result Value Ref Range   Microalb, Alison Waived 150 (H) 0 - 19 mg/L   Creatinine, Urine Waived 200 10 - 300 mg/dL   Microalb/Creat Ratio 30-300 (H) <30 mg/g  TSH  Result Value Ref Range   TSH 2.240 0.450 - 4.500 uIU/mL  UA/M w/rflx Culture, Routine  Result Value Ref Range   Specific Gravity, UA 1.020 1.005 - 1.030   pH, UA 7.0 5.0 - 7.5   Color, UA  Yellow Yellow   Appearance Alison Hernandez (A) Clear   Leukocytes, UA 1+ (A) Negative   Protein, UA 1+ (A) Negative/Trace   Glucose, UA Negative Negative   Ketones, UA Negative Negative   RBC, UA Trace (A) Negative   Bilirubin, UA Negative Negative  Urobilinogen, Alison 1.0 0.2 - 1.0 mg/dL   Nitrite, UA Negative Negative   Microscopic Examination See below:    Urinalysis Reflex Comment       Assessment & Plan:   Problem List Items Addressed This Visit      Endocrine   Diabetes [facility])    Doing well when she takes her medicine. Tolerating januvia well. Rechecking BMP today. Encouraged patient to take medicine daily or her sugars will go up. Call with any concerns. Recheck A1c in 2 months.       Relevant Medications   rosuvastatin (CRESTOR) 10 MG tablet   lisinopril-hydrochlorothiazide (PRINZIDE,ZESTORETIC) 20-25 MG tablet   glipiZIDE (GLUCOTROL) 5 MG tablet   Other Relevant Orders   Basic metabolic panel     Genitourinary   Benign hypertensive renal disease - Primary    Under good control on the januvia. Continue current regimen. Continue to monitor. Call with any concerns.       Relevant Orders   Basic metabolic panel       Follow up plan: Return in about 2 months (around 07/04/2017) for DM visit.

## 2017-05-06 NOTE — Assessment & Plan Note (Signed)
Doing well when she takes her medicine. Tolerating januvia well. Rechecking BMP today. Encouraged patient to take medicine daily or her sugars will go up. Call with any concerns. Recheck A1c in 2 months.

## 2017-05-06 NOTE — Assessment & Plan Note (Signed)
Under good control on the [place]uvia. Continue current regimen. Continue to monitor. Call with any concerns.

## 2017-05-07 LAB — BASIC METABOLIC PANEL
BUN / CREAT RATIO: 24 (ref 12–28)
BUN: 29 mg/dL — ABNORMAL HIGH (ref 8–27)
CO2: 21 mmol/L (ref 20–29)
CREATININE: 1.2 mg/dL — AB (ref 0.57–1.00)
Calcium: 9.3 mg/dL (ref 8.7–10.3)
Chloride: 102 mmol/L (ref 96–106)
GFR calc Af Amer: 53 mL/min/{1.73_m2} — ABNORMAL LOW (ref >59)
GFR calc non Af Amer: 46 mL/min/{1.73_m2} — ABNORMAL LOW (ref >59)
GLUCOSE: 144 mg/dL — AB (ref 65–99)
Potassium: 4.3 mmol/L (ref 3.5–5.2)
Sodium: 140 mmol/L (ref 134–144)

## 2017-05-09 ENCOUNTER — Other Ambulatory Visit: Payer: Self-pay | Admitting: Family Medicine

## 2017-06-25 ENCOUNTER — Other Ambulatory Visit: Payer: Self-pay | Admitting: Family Medicine

## 2017-07-01 ENCOUNTER — Ambulatory Visit (INDEPENDENT_AMBULATORY_CARE_PROVIDER_SITE_OTHER): Payer: Medicare Other | Admitting: Family Medicine

## 2017-07-01 ENCOUNTER — Encounter: Payer: Self-pay | Admitting: Family Medicine

## 2017-07-01 VITALS — BP 124/80 | HR 97 | Wt 193.1 lb

## 2017-07-01 DIAGNOSIS — R112 Nausea with vomiting, unspecified: Secondary | ICD-10-CM

## 2017-07-01 DIAGNOSIS — N182 Chronic kidney disease, stage 2 (mild): Secondary | ICD-10-CM | POA: Diagnosis not present

## 2017-07-01 DIAGNOSIS — F3341 Major depressive disorder, recurrent, in partial remission: Secondary | ICD-10-CM | POA: Diagnosis not present

## 2017-07-01 DIAGNOSIS — E1122 Type 2 diabetes mellitus with diabetic chronic kidney disease: Secondary | ICD-10-CM | POA: Diagnosis not present

## 2017-07-01 LAB — BAYER DCA HB A1C WAIVED: HB A1C (BAYER DCA - WAIVED): 7.7 % — ABNORMAL HIGH (ref <7.0)

## 2017-07-01 MED ORDER — OMEPRAZOLE 20 MG PO CPDR: 40.0000 mg | DELAYED_RELEASE_CAPSULE | Freq: Every day | ORAL | 3 refills | Status: DC

## 2017-07-01 MED ORDER — BUPROPION HCL ER (XL) 150 MG PO TB24
150.0000 mg | ORAL_TABLET | Freq: Every day | ORAL | 1 refills | Status: DC
Start: 2017-07-01 — End: 2017-11-18

## 2017-07-01 MED ORDER — ONDANSETRON 8 MG PO TBDP: 8.0000 mg | ORAL_TABLET | Freq: Three times a day (TID) | ORAL | 3 refills | Status: DC | PRN

## 2017-07-01 NOTE — Assessment & Plan Note (Signed)
Not under good control. Has not been taking her [place]uvia due to cost. A1c 7.7. Will check in with pharmacy about cost. Will hopefully restart [place]uvia. Recheck A1c in 3 months.

## 2017-07-01 NOTE — Progress Notes (Signed)
BP 124/80 (BP Location: Left Arm, Patient Position: Sitting, Cuff Size: Normal)   Pulse 97   Wt 193 lb 1 oz (87.6 kg)   SpO2 99%   BMI 31.16 kg/m    Subjective:    Patient ID: Alison Hernandez, female    DOB: 1946/03/03, 72 y.o.   MRN: [id number]  HPI: Alison Hazyancy J Bryk is a 72 y.o. female  Chief Complaint  Patient presents with  . Diabetes    Patient would like to know if there is something different that she can take other than Janivia, she cant afford it.   ABDOMINAL ISSUES- Has been feeling really sick on her stomach for the past couple of months, probably at least 6 months. She notes that she feels really nauseous most of the day and has been throwing up at least in the AM. Nausea is not constant. She notes that she is nauseous and dry heaves in the AM. She notes that she goes to work and she is fine for about 1.5-2 hours and then she starts having issues with nausea again, she'll eat and feel better. It happens more often at home than when she's at work. Does not eat as regularly at home as she does at work. She notes that she has checked her sugar with this and it is usually in the 180s. Does not feel like her hypoglycemia.  She does note that she has been under a lot of stress at home. Has only been taking her wellbutrin daily instead of 2x a day. No weight loss. No blood in her stool. No diarrhea. Very concerned that she has stomach cancer.  DIABETES Hypoglycemic episodes:no Polydipsia/polyuria: no Visual disturbance: no Chest pain: no Paresthesias: no Glucose Monitoring: yes  Accucheck frequency: Daily  Fasting glucose:  Post prandial:  Evening:  Before meals: Taking Insulin?: no Blood Pressure Monitoring: not checking Retinal Examination: Not up to Date Foot Exam: Up to Date Diabetic Education: Completed Pneumovax: Up to Date Influenza: Up to Date Aspirin: yes  Relevant past medical, surgical, family and social history reviewed and updated as indicated. Interim  medical history since our last visit reviewed. Allergies and medications reviewed and updated.  Review of Systems  Constitutional: Negative.   Respiratory: Negative.   Cardiovascular: Negative.   Gastrointestinal: Positive for nausea and vomiting. Negative for abdominal distention, abdominal pain, anal bleeding, blood in stool, constipation, diarrhea and rectal pain.  Neurological: Negative.   Psychiatric/Behavioral: Negative.     Per HPI unless specifically indicated above     Objective:    BP 124/80 (BP Location: Left Arm, Patient Position: Sitting, Cuff Size: Normal)   Pulse 97   Wt 193 lb 1 oz (87.6 kg)   SpO2 99%   BMI 31.16 kg/m   Wt Readings from Last 3 Encounters:  07/01/17 193 lb 1 oz (87.6 kg)  05/06/17 194 lb 6 oz (88.2 kg)  04/08/17 195 lb 7 oz (88.6 kg)    Physical Exam  Constitutional: She is oriented to person, place, and time. She appears well-developed and well-nourished. No distress.  HENT:  Head: Normocephalic and atraumatic.  Right Ear: Hearing normal.  Left Ear: Hearing normal.  Nose: Nose normal.  Eyes: Conjunctivae and lids are normal. Right eye exhibits no discharge. Left eye exhibits no discharge. No scleral icterus.  Cardiovascular: Normal rate, regular rhythm, normal heart sounds and intact distal pulses. Exam reveals no gallop and no friction rub.  No murmur heard. Pulmonary/Chest: Effort normal and breath sounds normal.  No stridor. No respiratory distress. She has no wheezes. She has no rales. She exhibits no tenderness.  Abdominal: Soft. Bowel sounds are normal. She exhibits no distension and no mass. There is no tenderness. There is no rebound and no guarding. No hernia.  Musculoskeletal: Normal range of motion.  Neurological: She is alert and oriented to person, place, and time.  Skin: Skin is intact. No rash noted. She is not diaphoretic.  Psychiatric: Her speech is normal and behavior is normal. Judgment and thought content normal. Her mood  appears anxious. Cognition and memory are normal.  Nursing note and vitals reviewed.   Results for orders placed or performed in visit on 05/06/17  Basic metabolic panel  Result Value Ref Range   Glucose 144 (H) 65 - 99 mg/dL   BUN 29 (H) 8 - 27 mg/dL   Creatinine, Ser 7.82 (H) 0.57 - 1.00 mg/dL   GFR calc non Af Amer 46 (L) >59 mL/min/1.73   GFR calc Af Amer 53 (L) >59 mL/min/1.73   BUN/Creatinine Ratio 24 12 - 28   Sodium 140 134 - 144 mmol/L   Potassium 4.3 3.5 - 5.2 mmol/L   Chloride 102 96 - 106 mmol/L   CO2 21 20 - 29 mmol/L   Calcium 9.3 8.7 - 10.3 mg/dL      Assessment & Plan:   Problem List Items Addressed This Visit      Endocrine   Diabetes (HCC) - Primary    Not under good control. Has not been taking her [place] due to cost. A1c 7.7. Will check in with pharmacy about cost. Will hopefully restart [place]. Recheck A1c in 3 months.       Relevant Orders   Bayer DCA Hb A1c Waived     Other   Depression    Not under good control. Has only been taking her wellbutrin 1x a day. Will change to long acting for ease of use. Recheck 2 weeks.       Relevant Medications   buPROPion (WELLBUTRIN XL) 150 MG 24 hr tablet    Other Visit Diagnoses    Non-intractable vomiting with nausea, unspecified vomiting type       Likely multifactorial. Increase zofran. Increase protonix. Recheck 2 weeks. If no better, will see GI- referral generated today.   Relevant Orders   Ambulatory referral to Gastroenterology       Follow up plan: Return in about 2 weeks (around 07/15/2017) for recheck vomiting and mood.

## 2017-07-01 NOTE — Assessment & Plan Note (Signed)
Not under good control. Has only been taking her wellbutrin 1x a day. Will change to long acting for ease of use. Recheck 2 weeks.

## 2017-07-15 ENCOUNTER — Ambulatory Visit (INDEPENDENT_AMBULATORY_CARE_PROVIDER_SITE_OTHER): Payer: Medicare Other | Admitting: Family Medicine

## 2017-07-15 ENCOUNTER — Encounter: Payer: Self-pay | Admitting: Family Medicine

## 2017-07-15 VITALS — BP 124/66 | HR 83 | Temp 98.7°F | Wt 197.1 lb

## 2017-07-15 DIAGNOSIS — D692 Other nonthrombocytopenic purpura: Secondary | ICD-10-CM

## 2017-07-15 DIAGNOSIS — R112 Nausea with vomiting, unspecified: Secondary | ICD-10-CM | POA: Diagnosis not present

## 2017-07-15 DIAGNOSIS — F3341 Major depressive disorder, recurrent, in partial remission: Secondary | ICD-10-CM

## 2017-07-15 NOTE — Progress Notes (Signed)
BP 124/66 (BP Location: Left Arm, Cuff Size: Normal)   Pulse 83   Temp 98.7 F (37.1 C)   Wt 197 lb 2 oz (89.4 kg)   SpO2 99%   BMI 31.82 kg/m    Subjective:    Patient ID: Alison Hernandez, female    DOB: [birth year], 72 y.o.   MRN: [id number]  HPI: Alison Hernandez is a 72 y.o. female  Chief Complaint  Patient presents with  . Depression   ABDOMINAL ISSUES- now only with not eating. Has not needed to take a nausea pill. Feeling much better.   DEPRESSION Mood status: better Satisfied with current treatment?: yes Symptom severity: mild  Duration of current treatment : chronic Side effects: no Medication compliance: excellent compliance Psychotherapy/counseling: no  Previous psychiatric medications:  Depressed mood: no Anxious mood: no Anhedonia: no Significant weight loss or gain: no Insomnia: no  Fatigue: no Feelings of worthlessness or guilt: no Impaired concentration/indecisiveness: no Suicidal ideations: no Hopelessness: no Crying spells: no [facility] screen [facility] 07/15/2017 04/08/2017 11/06/2016 06/04/2016 10/25/2015  Decreased Interest 0 0 0 0 0  Down, Depressed, Hopeless 0 0 0 0 0  PHQ - 2 Score 0 0 0 0 0  Altered sleeping 0 0 - - 0  Tired, decreased energy 0 0 - - 0  Change in appetite 0 0 - - 0  Feeling bad or failure about yourself  0 0 - - 0  Trouble concentrating 0 0 - - 0  Moving slowly or fidgety/restless 0 0 - - 0  Suicidal thoughts 0 0 - - 0  PHQ-9 Score 0 0 - - 0  Difficult doing work/chores Not difficult at all Not difficult at all - - -    Relevant past medical, surgical, family and social history reviewed and updated as indicated. Interim medical history since our last visit reviewed. Allergies and medications reviewed and updated.  Review of Systems  Per HPI unless specifically indicated above     Objective:    BP 124/66 (BP Location: Left Arm, Cuff Size: Normal)   Pulse 83   Temp 98.7 F (37.1 C)   Wt 197 lb 2 oz (89.4 kg)    SpO2 99%   BMI 31.82 kg/m   Wt Readings from Last 3 Encounters:  07/15/17 197 lb 2 oz (89.4 kg)  07/01/17 193 lb 1 oz (87.6 kg)  05/06/17 194 lb 6 oz (88.2 kg)    Physical Exam  Constitutional: She is oriented to person, place, and time. She appears well-developed and well-nourished. No distress.  HENT:  Head: Normocephalic and atraumatic.  Right Ear: Hearing normal.  Left Ear: Hearing normal.  Nose: Nose normal.  Eyes: Conjunctivae and lids are normal. Right eye exhibits no discharge. Left eye exhibits no discharge. No scleral icterus.  Cardiovascular: Normal rate, regular rhythm, normal heart sounds and intact distal pulses. Exam reveals no gallop and no friction rub.  No murmur heard. Pulmonary/Chest: Effort normal and breath sounds normal. No stridor. No respiratory distress. She has no wheezes. She has no rales. She exhibits no tenderness.  Musculoskeletal: Normal range of motion.  Neurological: She is alert and oriented to person, place, and time.  Skin: Skin is warm, dry and intact. Capillary refill takes less than 2 seconds. No rash noted. She is not diaphoretic. No erythema. No pallor.  Bruising on L arm   Psychiatric: She has a normal mood and affect. Her speech is normal and behavior is normal. Judgment and thought  content normal. Cognition and memory are normal.  Nursing note and vitals reviewed.   Results for orders placed or performed in visit on 07/01/17  Bayer DCA Hb A1c Waived  Result Value Ref Range   Bayer DCA Hb A1c Waived 7.7 (H) <7.0 %      Assessment & Plan:   Problem List Items Addressed This Visit      Other   Depression - Primary    Doing much better! Feeling good on her wellbutrin. Continue current regimen. Continue to monitor. Recheck July with Diabetes follow up.       Other Visit Diagnoses    Senile purpura (HCC)       Reassured patient. Call if getting worse.    Non-intractable vomiting with nausea, unspecified vomiting type        Resolved with treatment of anxiety. Call with any concerns.        Follow up plan: Return in about 3 months (around 10/14/2017) for Diabetes follow up.

## 2017-07-15 NOTE — Assessment & Plan Note (Signed)
Doing much better! Feeling good on her wellbutrin. Continue current regimen. Continue to monitor. Recheck July with Diabetes follow up.

## 2017-08-21 IMAGING — CR DG HUMERUS 2V *R*
2 series · 2 of 2 positions shown · non-contrast
Comparison: None.

CLINICAL DATA: Status post fall, with right upper arm pain. Initial
encounter.

EXAM:
RIGHT HUMERUS - 2+ VIEW

[humerus ap]
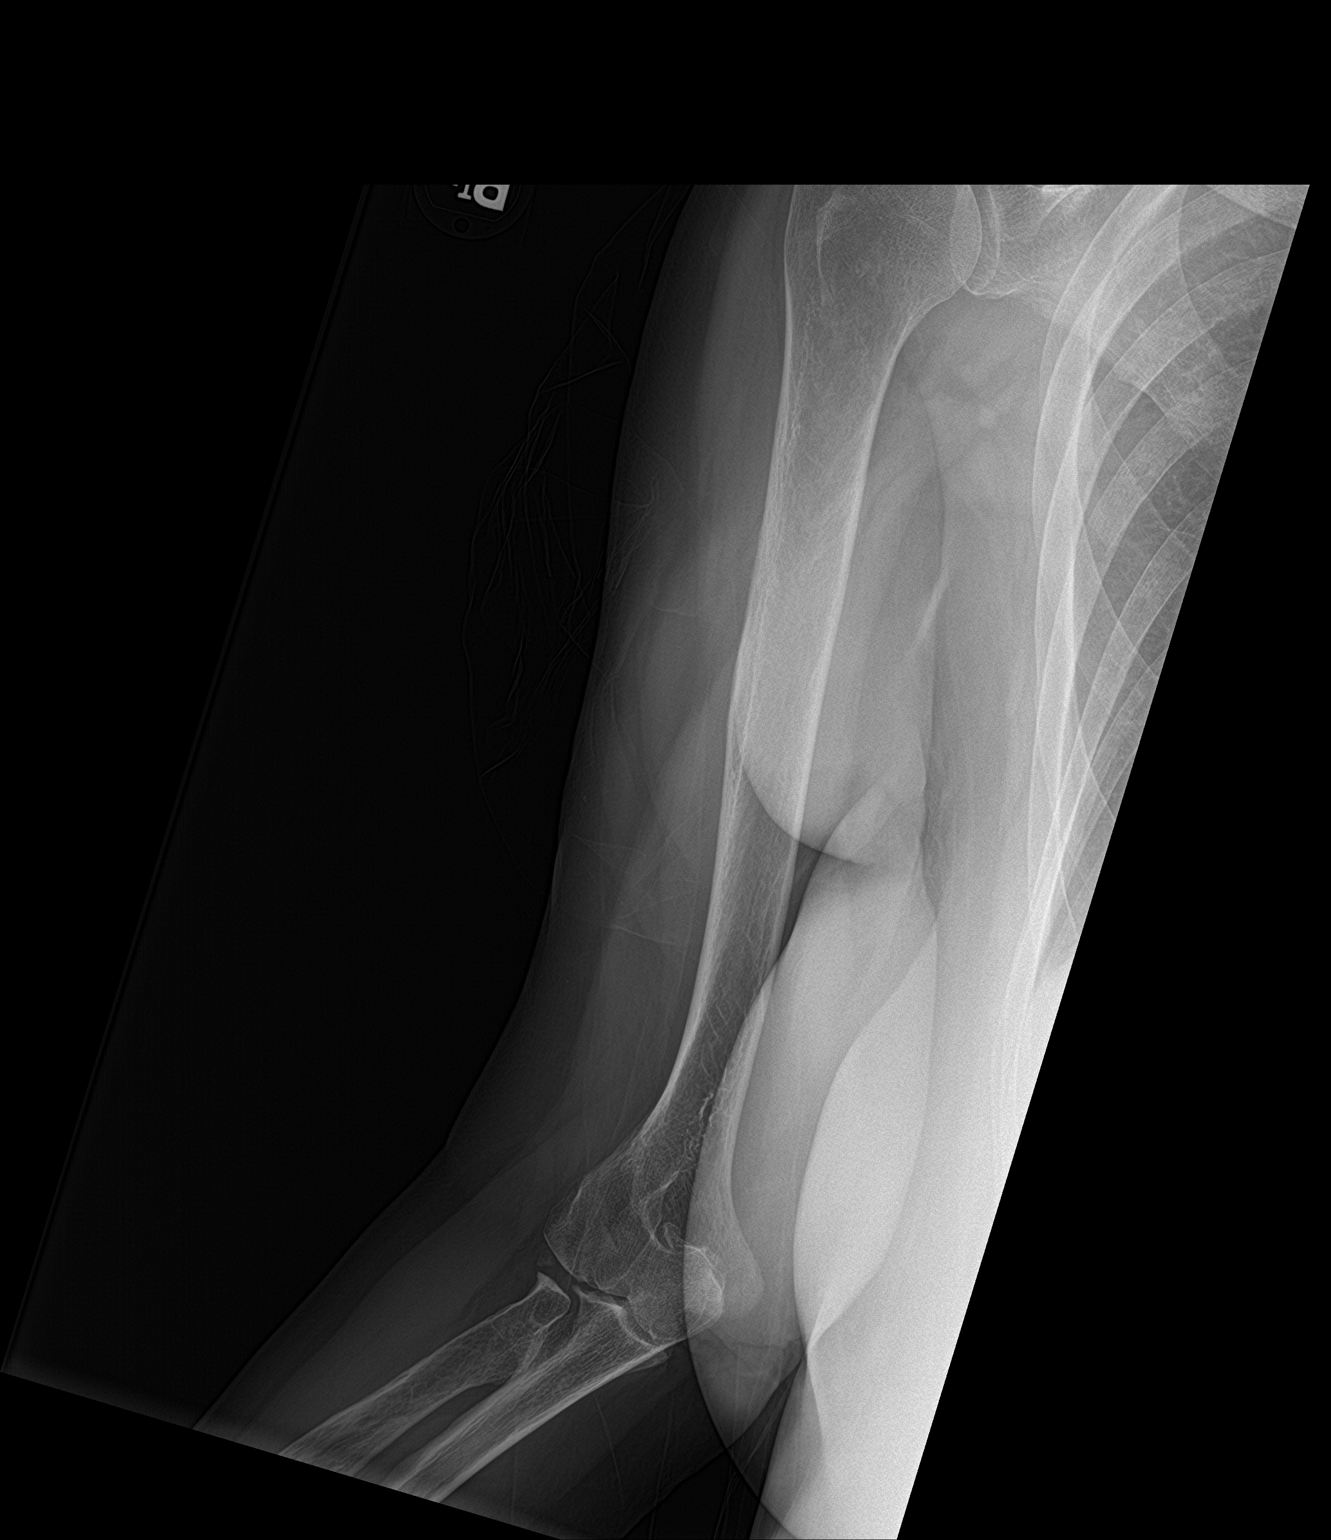

[humerus lat]
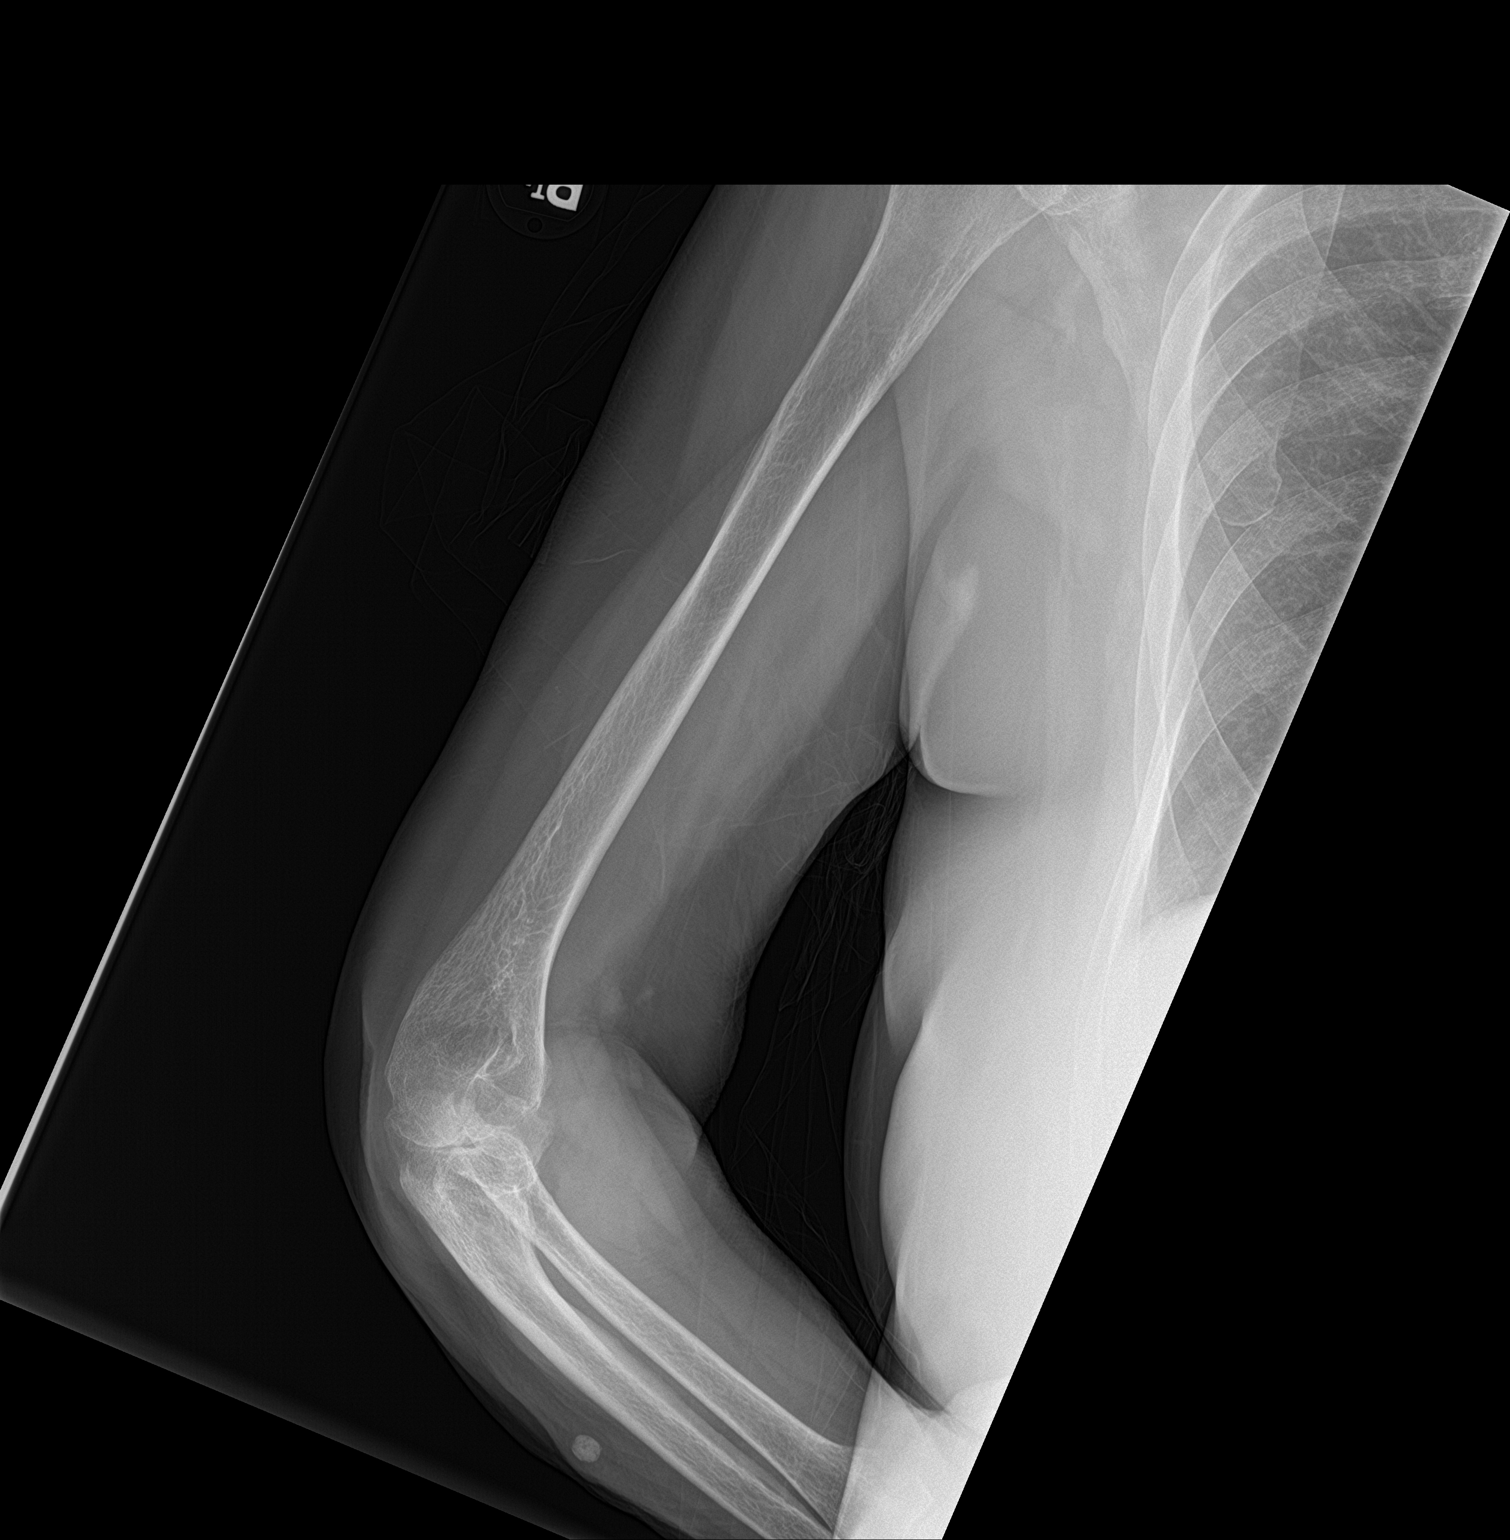

[2 of 2 positions shown; findings below may reference images not displayed]

FINDINGS: There is no evidence of fracture or dislocation. The right humerus
appears intact. The right humeral head remains seated at the glenoid
fossa. Degenerative change is noted at the right glenohumeral joint.
The elbow joint is incompletely assessed, but appears grossly
unremarkable.

No definite soft tissue abnormalities are characterized on
radiograph. The visualized portions of the right lung are clear.
IMPRESSION: No evidence of fracture or dislocation.

## 2017-08-26 ENCOUNTER — Ambulatory Visit: Payer: Medicare Other | Admitting: Gastroenterology

## 2017-09-17 ENCOUNTER — Other Ambulatory Visit: Payer: Self-pay

## 2017-09-17 MED ORDER — OMEPRAZOLE 20 MG PO CPDR: 40.0000 mg | DELAYED_RELEASE_CAPSULE | Freq: Every day | ORAL | 1 refills | Status: DC

## 2017-09-17 NOTE — Telephone Encounter (Signed)
[facility] - if this is correct we need updated RX - Thanks"  After chart review it does appear pt is now taking BID. Medication list does reflect changed. When RX was refilled class was listed as "No print"

## 2017-09-28 ENCOUNTER — Other Ambulatory Visit: Payer: Self-pay | Admitting: Family Medicine

## 2017-09-30 ENCOUNTER — Ambulatory Visit: Payer: Medicare Other | Admitting: Family Medicine

## 2017-11-11 ENCOUNTER — Ambulatory Visit: Payer: Medicare Other

## 2017-11-18 ENCOUNTER — Other Ambulatory Visit: Payer: Self-pay

## 2017-11-18 ENCOUNTER — Ambulatory Visit (INDEPENDENT_AMBULATORY_CARE_PROVIDER_SITE_OTHER): Payer: Medicare Other | Admitting: Family Medicine

## 2017-11-18 ENCOUNTER — Encounter: Payer: Self-pay | Admitting: Family Medicine

## 2017-11-18 VITALS — BP 135/80 | HR 84 | Temp 98.6°F | Wt 194.5 lb

## 2017-11-18 DIAGNOSIS — E782 Mixed hyperlipidemia: Secondary | ICD-10-CM

## 2017-11-18 DIAGNOSIS — F3341 Major depressive disorder, recurrent, in partial remission: Secondary | ICD-10-CM | POA: Diagnosis not present

## 2017-11-18 DIAGNOSIS — I129 Hypertensive chronic kidney disease with stage 1 through stage 4 chronic kidney disease, or unspecified chronic kidney disease: Secondary | ICD-10-CM | POA: Diagnosis not present

## 2017-11-18 DIAGNOSIS — N182 Chronic kidney disease, stage 2 (mild): Secondary | ICD-10-CM | POA: Diagnosis not present

## 2017-11-18 DIAGNOSIS — E1122 Type 2 diabetes mellitus with diabetic chronic kidney disease: Secondary | ICD-10-CM

## 2017-11-18 LAB — BAYER DCA HB A1C WAIVED: HB A1C (BAYER DCA - WAIVED): 7 % — ABNORMAL HIGH (ref <7.0)

## 2017-11-18 MED ORDER — ROSUVASTATIN CALCIUM 10 MG PO TABS: 10.0000 mg | ORAL_TABLET | Freq: Every day | ORAL | 1 refills | Status: DC

## 2017-11-18 MED ORDER — NYSTATIN 100000 UNIT/GM EX POWD: Freq: Four times a day (QID) | CUTANEOUS | 6 refills | Status: DC

## 2017-11-18 MED ORDER — GLIPIZIDE 5 MG PO TABS: ORAL_TABLET | ORAL | 1 refills | Status: DC

## 2017-11-18 MED ORDER — LISINOPRIL-HYDROCHLOROTHIAZIDE 20-25 MG PO TABS: 2.0000 | ORAL_TABLET | Freq: Every day | ORAL | 1 refills | Status: DC

## 2017-11-18 MED ORDER — SITAGLIPTIN PHOSPHATE 100 MG PO TABS: 100.0000 mg | ORAL_TABLET | Freq: Every day | ORAL | 1 refills | Status: DC

## 2017-11-18 MED ORDER — BUPROPION HCL ER (XL) 150 MG PO TB24: 150.0000 mg | ORAL_TABLET | Freq: Every day | ORAL | 1 refills | Status: DC

## 2017-11-18 NOTE — Assessment & Plan Note (Signed)
Under good control on current regimen. Continue current regimen. Continue to monitor. Call with any concerns. Refills given.   

## 2017-11-18 NOTE — Progress Notes (Signed)
BP 135/80   Pulse 84   Temp 98.6 F (37 C) (Oral)   Wt 194 lb 8 oz (88.2 kg)   SpO2 99%   BMI 31.39 kg/m    Subjective:    Patient ID: Alison Hernandez, female    DOB: [birth year], 72 y.o.   MRN: [id number]  HPI: Alison Hernandez is a 72 y.o. female  Chief Complaint  Patient presents with  . Diabetes   HYPERTENSION / HYPERLIPIDEMIA Satisfied with current treatment? yes Duration of hypertension: chronic BP monitoring frequency: not checking BP medication side effects: no Past BP meds: lisinopril-hctz Duration of hyperlipidemia: chronic Cholesterol medication side effects: no Cholesterol supplements: none Past cholesterol medications: crestor Medication compliance: excellent compliance Aspirin: no Recent stressors: no Recurrent headaches: no Visual changes: no Palpitations: no Dyspnea: no Chest pain: no Lower extremity edema: no Dizzy/lightheaded: no  DIABETES Hypoglycemic episodes:no Polydipsia/polyuria: no Visual disturbance: no Chest pain: no Paresthesias: no Glucose Monitoring: no Taking Insulin?: no Blood Pressure Monitoring: not checking Retinal Examination: Up to Date Foot Exam: Up to Date Diabetic Education: Completed Pneumovax: Up to Date Influenza: Will get next visit Aspirin: yes  DEPRESSION Mood status: controlled Satisfied with current treatment?: yes Symptom severity: mild  Duration of current treatment : chronic Side effects: no Medication compliance: excellent compliance Psychotherapy/counseling: no  Previous psychiatric medications: wellbutrin Depressed mood: no Anxious mood: no Anhedonia: no Significant weight loss or gain: no Insomnia: no  Fatigue: no Feelings of worthlessness or guilt: no Impaired concentration/indecisiveness: no Suicidal ideations: no Hopelessness: no Crying spells: no Depression screen [facility] 11/18/2017 07/15/2017 04/08/2017 11/06/2016 06/04/2016  Decreased Interest 0 0 0 0 0  Down, Depressed, Hopeless 0 0  0 0 0  PHQ - 2 Score 0 0 0 0 0  Altered sleeping 0 0 0 - -  Tired, decreased energy 0 0 0 - -  Change in appetite 0 0 0 - -  Feeling bad or failure about yourself  0 0 0 - -  Trouble concentrating 0 0 0 - -  Moving slowly or fidgety/restless 0 0 0 - -  Suicidal thoughts 0 0 0 - -  PHQ-9 Score 0 0 0 - -  Difficult doing work/chores Not difficult at all Not difficult at all Not difficult at all - -     Relevant past medical, surgical, family and social history reviewed and updated as indicated. Interim medical history since our last visit reviewed. Allergies and medications reviewed and updated.  Review of Systems  Constitutional: Negative.   Respiratory: Negative.   Cardiovascular: Negative.   Neurological: Negative.   Psychiatric/Behavioral: Negative.     Per HPI unless specifically indicated above     Objective:    BP 135/80   Pulse 84   Temp 98.6 F (37 C) (Oral)   Wt 194 lb 8 oz (88.2 kg)   SpO2 99%   BMI 31.39 kg/m   Wt Readings from Last 3 Encounters:  11/18/17 194 lb 8 oz (88.2 kg)  07/15/17 197 lb 2 oz (89.4 kg)  07/01/17 193 lb 1 oz (87.6 kg)    Physical Exam  Constitutional: She is oriented to person, place, and time. She appears well-developed and well-nourished. No distress.  HENT:  Head: Normocephalic and atraumatic.  Right Ear: Hearing normal.  Left Ear: Hearing normal.  Nose: Nose normal.  Eyes: Conjunctivae and lids are normal. Right eye exhibits no discharge. Left eye exhibits no discharge. No scleral icterus.  Cardiovascular: Normal rate, regular  rhythm, normal heart sounds and intact distal pulses. Exam reveals no gallop and no friction rub.  No murmur heard. Pulmonary/Chest: Effort normal and breath sounds normal. No stridor. No respiratory distress. She has no wheezes. She has no rales. She exhibits no tenderness.  Musculoskeletal: Normal range of motion.  Neurological: She is alert and oriented to person, place, and time.  Skin: Skin is  warm, dry and intact. Capillary refill takes less than 2 seconds. No rash noted. She is not diaphoretic. No erythema. No pallor.  Psychiatric: She has a normal mood and affect. Her speech is normal and behavior is normal. Judgment and thought content normal. Cognition and memory are normal.  Nursing note and vitals reviewed.   Results for orders placed or performed in visit on 07/01/17  Bayer DCA Hb A1c Waived  Result Value Ref Range   HB A1C (BAYER DCA - WAIVED) 7.7 (H) <7.0 %      Assessment & Plan:   Problem List Items Addressed This Visit      Endocrine   Diabetes (HCC) - Primary    Under good control with A1c of 7.0. Continue current regimen. Continue to monitor. Call with any concerns.       Relevant Medications   glipiZIDE (GLUCOTROL) 5 MG tablet   sitaGLIPtin (JANUVIA) 100 MG tablet   lisinopril-hydrochlorothiazide (PRINZIDE,ZESTORETIC) 20-25 MG tablet   rosuvastatin (CRESTOR) 10 MG tablet   Other Relevant Orders   Bayer DCA Hb A1c Waived   Comprehensive metabolic panel     Genitourinary   Benign hypertensive renal disease    Under good control on current regimen. Continue current regimen. Continue to monitor. Call with any concerns. Refills given.        Relevant Orders   Comprehensive metabolic panel     Other   Hyperlipidemia    Under good control on current regimen. Continue current regimen. Continue to monitor. Call with any concerns. Refills given.        Relevant Medications   lisinopril-hydrochlorothiazide (PRINZIDE,ZESTORETIC) 20-25 MG tablet   rosuvastatin (CRESTOR) 10 MG tablet   Other Relevant Orders   Comprehensive metabolic panel   Lipid Panel w/o Chol/HDL Ratio   Depression    Under good control on current regimen. Continue current regimen. Continue to monitor. Call with any concerns. Refills given.        Relevant Medications   buPROPion (WELLBUTRIN XL) 150 MG 24 hr tablet       Follow up plan: Return in about 3 months (around  02/18/2018) for Physical.

## 2017-11-18 NOTE — Assessment & Plan Note (Signed)
Under good control with A1c of 7.0. Continue current regimen. Continue to monitor. Call with any concerns.  

## 2017-11-19 LAB — LIPID PANEL W/O CHOL/HDL RATIO
CHOLESTEROL TOTAL: 142 mg/dL (ref 100–199)
HDL: 51 mg/dL (ref >39)
LDL CALC: 64 mg/dL (ref 0–99)
TRIGLYCERIDES: 135 mg/dL (ref 0–149)
VLDL Cholesterol Cal: 27 mg/dL (ref 5–40)

## 2017-11-19 LAB — COMPREHENSIVE METABOLIC PANEL
A/G RATIO: 1.8 (ref 1.2–2.2)
ALBUMIN: 4.4 g/dL (ref 3.5–4.8)
ALK PHOS: 75 IU/L (ref 39–117)
ALT: 11 IU/L (ref 0–32)
AST: 15 IU/L (ref 0–40)
BILIRUBIN TOTAL: 0.3 mg/dL (ref 0.0–1.2)
BUN / CREAT RATIO: 21 (ref 12–28)
BUN: 25 mg/dL (ref 8–27)
CO2: 21 mmol/L (ref 20–29)
Calcium: 9.2 mg/dL (ref 8.7–10.3)
Chloride: 104 mmol/L (ref 96–106)
Creatinine, Ser: 1.21 mg/dL — ABNORMAL HIGH (ref 0.57–1.00)
GFR calc Af Amer: 52 mL/min/{1.73_m2} — ABNORMAL LOW (ref >59)
GFR calc non Af Amer: 45 mL/min/{1.73_m2} — ABNORMAL LOW (ref >59)
GLOBULIN, TOTAL: 2.4 g/dL (ref 1.5–4.5)
Glucose: 122 mg/dL — ABNORMAL HIGH (ref 65–99)
POTASSIUM: 4.5 mmol/L (ref 3.5–5.2)
Sodium: 140 mmol/L (ref 134–144)
Total Protein: 6.8 g/dL (ref 6.0–8.5)

## 2017-12-09 ENCOUNTER — Ambulatory Visit: Payer: Medicare Other

## 2018-01-04 ENCOUNTER — Encounter: Payer: Self-pay | Admitting: Family Medicine

## 2018-02-17 ENCOUNTER — Ambulatory Visit: Payer: Medicare Other | Admitting: Family Medicine

## 2018-02-22 ENCOUNTER — Ambulatory Visit: Payer: Medicare Other | Admitting: Family Medicine

## 2018-03-10 ENCOUNTER — Ambulatory Visit: Payer: Medicare Other | Admitting: Family Medicine

## 2018-04-04 ENCOUNTER — Other Ambulatory Visit: Payer: Self-pay | Admitting: Family Medicine

## 2018-04-07 ENCOUNTER — Encounter: Payer: Self-pay | Admitting: Family Medicine

## 2018-04-07 ENCOUNTER — Ambulatory Visit (INDEPENDENT_AMBULATORY_CARE_PROVIDER_SITE_OTHER): Payer: Medicare Other | Admitting: Family Medicine

## 2018-04-07 VITALS — BP 151/77 | HR 75 | Temp 99.1°F | Ht 66.0 in | Wt 199.2 lb

## 2018-04-07 DIAGNOSIS — N182 Chronic kidney disease, stage 2 (mild): Secondary | ICD-10-CM | POA: Diagnosis not present

## 2018-04-07 DIAGNOSIS — D649 Anemia, unspecified: Secondary | ICD-10-CM

## 2018-04-07 DIAGNOSIS — E782 Mixed hyperlipidemia: Secondary | ICD-10-CM | POA: Diagnosis not present

## 2018-04-07 DIAGNOSIS — D692 Other nonthrombocytopenic purpura: Secondary | ICD-10-CM

## 2018-04-07 DIAGNOSIS — Z23 Encounter for immunization: Secondary | ICD-10-CM | POA: Diagnosis not present

## 2018-04-07 DIAGNOSIS — I129 Hypertensive chronic kidney disease with stage 1 through stage 4 chronic kidney disease, or unspecified chronic kidney disease: Secondary | ICD-10-CM | POA: Diagnosis not present

## 2018-04-07 DIAGNOSIS — F3341 Major depressive disorder, recurrent, in partial remission: Secondary | ICD-10-CM

## 2018-04-07 DIAGNOSIS — E1122 Type 2 diabetes mellitus with diabetic chronic kidney disease: Secondary | ICD-10-CM

## 2018-04-07 LAB — MICROSCOPIC EXAMINATION
Bacteria, UA: NONE SEEN
RBC, UA: NONE SEEN /hpf (ref 0–2)

## 2018-04-07 LAB — UA/M W/RFLX CULTURE, ROUTINE
Bilirubin, UA: NEGATIVE
Ketones, UA: NEGATIVE
Leukocytes, UA: NEGATIVE
Nitrite, UA: NEGATIVE
PH UA: 6.5 (ref 5.0–7.5)
RBC, UA: NEGATIVE
Specific Gravity, UA: 1.025 (ref 1.005–1.030)
Urobilinogen, Ur: 0.2 mg/dL (ref 0.2–1.0)

## 2018-04-07 LAB — BAYER DCA HB A1C WAIVED: HB A1C (BAYER DCA - WAIVED): 8.8 % — ABNORMAL HIGH (ref <7.0)

## 2018-04-07 LAB — MICROALBUMIN, URINE WAIVED
Creatinine, Urine Waived: 200 mg/dL (ref 10–300)
Microalb, Ur Waived: 150 mg/L — ABNORMAL HIGH (ref 0–19)

## 2018-04-07 MED ORDER — OMEPRAZOLE 20 MG PO CPDR: 40.0000 mg | DELAYED_RELEASE_CAPSULE | Freq: Every day | ORAL | 1 refills | Status: DC

## 2018-04-07 MED ORDER — LISINOPRIL-HYDROCHLOROTHIAZIDE 20-25 MG PO TABS: 2.0000 | ORAL_TABLET | Freq: Every day | ORAL | 1 refills | Status: DC

## 2018-04-07 MED ORDER — DICLOFENAC SODIUM 1 % TD GEL: 4.0000 g | Freq: Four times a day (QID) | TRANSDERMAL | 3 refills | Status: DC

## 2018-04-07 MED ORDER — GLIPIZIDE 5 MG PO TABS: ORAL_TABLET | ORAL | 1 refills | Status: DC

## 2018-04-07 MED ORDER — ONDANSETRON 8 MG PO TBDP: 8.0000 mg | ORAL_TABLET | Freq: Three times a day (TID) | ORAL | 3 refills | Status: DC | PRN

## 2018-04-07 MED ORDER — BUPROPION HCL ER (XL) 150 MG PO TB24: 150.0000 mg | ORAL_TABLET | Freq: Every day | ORAL | 1 refills | Status: DC

## 2018-04-07 MED ORDER — SITAGLIPTIN PHOSPHATE 100 MG PO TABS: 100.0000 mg | ORAL_TABLET | Freq: Every day | ORAL | 6 refills | Status: DC

## 2018-04-07 MED ORDER — ROSUVASTATIN CALCIUM 10 MG PO TABS: 10.0000 mg | ORAL_TABLET | Freq: Every day | ORAL | 1 refills | Status: DC

## 2018-04-07 MED ORDER — METFORMIN HCL 1000 MG PO TABS: ORAL_TABLET | ORAL | 1 refills | Status: DC

## 2018-04-07 MED ORDER — MECLIZINE HCL 25 MG PO TABS: 25.0000 mg | ORAL_TABLET | Freq: Three times a day (TID) | ORAL | 6 refills | Status: DC | PRN

## 2018-04-07 NOTE — Assessment & Plan Note (Signed)
Under good control on current regimen. Continue current regimen. Continue to monitor. Call with any concerns. Refills given. Labs drawn today.   

## 2018-04-07 NOTE — Assessment & Plan Note (Signed)
Labs drawn today. Await results.  

## 2018-04-07 NOTE — Assessment & Plan Note (Addendum)
Under good control on current regimen. Continue current regimen. Continue to monitor. Call with any concerns. Refills given. Labs drawn today.   

## 2018-04-07 NOTE — Assessment & Plan Note (Signed)
Reassured patient. Call with any concerns.  

## 2018-04-07 NOTE — Patient Instructions (Signed)
Influenza (Flu) Vaccine (Inactivated or Recombinant): What You Need to Know  1. Why get vaccinated?  Influenza vaccine can prevent influenza (flu).  Flu is a contagious disease that spreads around the [place] every year, usually between October and May. Anyone can get the flu, but it is more dangerous for some people. Infants and young children, people 71 years of age and older, pregnant women, and people with certain health conditions or a weakened immune system are at greatest risk of flu complications.  Pneumonia, bronchitis, sinus infections and ear infections are examples of flu-related complications. If you have a medical condition, such as heart disease, cancer or diabetes, flu can make it worse.  Flu can cause fever and chills, sore throat, muscle aches, fatigue, cough, headache, and runny or stuffy nose. Some people may have vomiting and diarrhea, though this is more common in children than adults.  Each year thousands of people in the [place] die from flu, and many more are hospitalized. Flu vaccine prevents millions of illnesses and flu-related visits to the doctor each year.  2. Influenza vaccine  CDC recommends everyone 6 months of age and older get vaccinated every flu season. Children 6 months through 8 years of age may need 2 doses during a single flu season. Everyone else needs only 1 dose each flu season.  It takes about 2 weeks for protection to develop after vaccination.  There are many flu viruses, and they are always changing. Each year a new flu vaccine is made to protect against three or four viruses that are likely to cause disease in the upcoming flu season. Even when the vaccine doesn't exactly match these viruses, it may still provide some protection.  Influenza vaccine does not cause flu.  Influenza vaccine may be given at the same time as other vaccines.  3. Talk with your health care provider  Tell your vaccine provider if the person getting the vaccine:  · Has had an  allergic reaction after a previous dose of influenza vaccine, or has any severe, life-threatening allergies.  · Has ever had Guillain-Barré Syndrome (also called GBS).  In some cases, your health care provider may decide to postpone influenza vaccination to a future visit.  People with minor illnesses, such as a cold, may be vaccinated. People who are moderately or severely ill should usually wait until they recover before getting influenza vaccine.  Your health care provider can give you more information.  4. Risks of a vaccine reaction  · Soreness, redness, and swelling where shot is given, fever, muscle aches, and headache can happen after influenza vaccine.  · There may be a very small increased risk of Guillain-Barré Syndrome (GBS) after inactivated influenza vaccine (the flu shot).  Young children who get the flu shot along with pneumococcal vaccine (PCV13), and/or DTaP vaccine at the same time might be slightly more likely to have a seizure caused by fever. Tell your health care provider if a child who is getting flu vaccine has ever had a seizure.  People sometimes faint after medical procedures, including vaccination. Tell your provider if you feel dizzy or have vision changes or ringing in the ears.  As with any medicine, there is a very remote chance of a vaccine causing a severe allergic reaction, other serious injury, or death.  5. What if there is a serious problem?  An allergic reaction could occur after the vaccinated person leaves the clinic. If you see signs of a severe allergic reaction (hives, swelling   of the face and throat, difficulty breathing, a fast heartbeat, dizziness, or weakness), call 9-1-1 and get the person to the nearest hospital.  For other signs that concern you, call your health care provider.  Adverse reactions should be reported to the Vaccine Adverse Event Reporting System (VAERS). Your health care provider will usually file this report, or you can do it yourself. Visit the  VAERS website at www.vaers.hhs.gov or call 1-800-822-7967.VAERS is only for reporting reactions, and VAERS staff do not give medical advice.  6. The National Vaccine Injury Compensation Program  The National Vaccine Injury Compensation Program (VICP) is a federal program that was created to compensate people who may have been injured by certain vaccines. Visit the VICP website at www.hrsa.gov/vaccinecompensation or call 1-800-338-2382 to learn about the program and about filing a claim. There is a time limit to file a claim for compensation.  7. How can I learn more?  · Ask your healthcare provider.  · Call your local or state health department.  · Contact the Centers for Disease Control and Prevention (CDC):  ? Call 1-800-232-4636 (1-800-CDC-INFO) or  ? Visit CDC's www.cdc.gov/flu  Vaccine Information Statement (Interim) Inactivated Influenza Vaccine (11/05/2017)  This information is not intended to replace advice given to you by your health care provider. Make sure you discuss any questions you have with your health care provider.  Document Released: 01/02/2006 Document Revised: 11/09/2017 Document Reviewed: 11/09/2017  Elsevier Interactive Patient Education © 2019 [employer]

## 2018-04-07 NOTE — Assessment & Plan Note (Signed)
Not under good control with A1c of 8.8 because she was off her [place]. Has restarted it. Recheck 3 months. Call with any concerns.

## 2018-04-07 NOTE — Progress Notes (Signed)
BP (!) 151/77 (BP Location: Left Arm, Patient Position: Sitting, Cuff Size: Large)   Pulse 75   Temp 99.1 F (37.3 C)   Ht 5\' 6"  (1.676 m)   Wt 199 lb 3 oz (90.4 kg)   SpO2 100%   BMI 32.15 kg/m    Subjective:    Patient ID: Alison Hernandez, female    DOB: November 05, 1945, 73 y.o.   MRN: [id number]  HPI: Alison Hernandez is a 73 y.o. female  Chief Complaint  Patient presents with  . Diabetes    patient was out of the [place], but restarted on Sunday  . Hyperlipidemia  . Hypertension   DIABETES- had been off the [place] for a couple of months because she as in the donut hole. She notes that she just got restarted it on Sunday Hypoglycemic episodes:no Polydipsia/polyuria: no Visual disturbance: no Chest pain: no Paresthesias: no Glucose Monitoring: yes  Accucheck frequency: Daily  Fasting glucose: 307 Taking Insulin?: no Blood Pressure Monitoring: not checking Retinal Examination: Not up to Date Foot Exam: Up to Date Diabetic Education: Completed Pneumovax: Up to Date Influenza: Up to Date Aspirin: yes  HYPERTENSION / HYPERLIPIDEMIA Satisfied with current treatment? yes Duration of hypertension: chronic BP monitoring frequency: not checking BP medication side effects: no Past BP meds: lisinopril, hctz Duration of hyperlipidemia: chronic Cholesterol medication side effects: no Cholesterol supplements: none Past cholesterol medications: crestor Medication compliance: good compliance Aspirin: yes Recent stressors: no Recurrent headaches: no Visual changes: no Palpitations: no Dyspnea: no Chest pain: no Lower extremity edema: no Dizzy/lightheaded: no  DEPRESSION Mood status: stable Satisfied with current treatment?: yes Symptom severity: mild  Duration of current treatment : chronic Side effects: no Medication compliance: excellent compliance Psychotherapy/counseling: no  Previous psychiatric medications: wellbutrin Depressed mood: no Anxious mood:  no Anhedonia: no Significant weight loss or gain: no Insomnia: no  Fatigue: yes Feelings of worthlessness or guilt: no Impaired concentration/indecisiveness: no Suicidal ideations: no Hopelessness: no Crying spells: no [facility] screen [facility] 2[facility] 04/07/2018 11/18/2017 07/15/2017 04/08/2017 11/06/2016  Decreased Interest 0 0 0 0 0  Down, Depressed, Hopeless 0 0 0 0 0  PHQ - 2 Score 0 0 0 0 0  Altered sleeping 0 0 0 0 -  Tired, decreased energy 0 0 0 0 -  Change in appetite 0 0 0 0 -  Feeling bad or failure about yourself  0 0 0 0 -  Trouble concentrating 0 0 0 0 -  Moving slowly or fidgety/restless 0 0 0 0 -  Suicidal thoughts 0 0 0 0 -  PHQ-9 Score 0 0 0 0 -  Difficult doing work/chores Not difficult at all Not difficult at all Not difficult at all Not difficult at all -    Relevant past medical, surgical, family and social history reviewed and updated as indicated. Interim medical history since our last visit reviewed. Allergies and medications reviewed and updated.  Review of Systems  Constitutional: Negative.   Respiratory: Negative.   Cardiovascular: Negative.   Gastrointestinal: Negative.   Musculoskeletal: Negative.   Neurological: Negative.   Psychiatric/Behavioral: Negative.     Per HPI unless specifically indicated above     Objective:    BP (!) 151/77 (BP Location: Left Arm, Patient Position: Sitting, Cuff Size: Large)   Pulse 75   Temp 99.1 F (37.3 C)   Ht 5\' 6"  (1.676 m)   Wt 199 lb 3 oz (90.4 kg)   SpO2 100%   BMI 32.15 kg/m  Wt Readings from Last 3 Encounters:  04/07/18 199 lb 3 oz (90.4 kg)  11/18/17 194 lb 8 oz (88.2 kg)  07/15/17 197 lb 2 oz (89.4 kg)    Physical Exam Vitals signs and nursing note reviewed.  Constitutional:      General: She is not in acute distress.    Appearance: Normal appearance. She is not ill-appearing, toxic-appearing or diaphoretic.  HENT:     Head: Normocephalic and atraumatic.     Right Ear: External ear normal.      Left Ear: External ear normal.     Nose: Nose normal.     Mouth/Throat:     Mouth: Mucous membranes are moist.     Pharynx: Oropharynx is clear.  Eyes:     General: No scleral icterus.       Right eye: No discharge.        Left eye: No discharge.     Extraocular Movements: Extraocular movements intact.     Conjunctiva/sclera: Conjunctivae normal.     Pupils: Pupils are equal, round, and reactive to light.  Neck:     Musculoskeletal: Normal range of motion and neck supple.  Cardiovascular:     Rate and Rhythm: Normal rate and regular rhythm.     Pulses: Normal pulses.     Heart sounds: Normal heart sounds. No murmur. No friction rub. No gallop.   Pulmonary:     Effort: Pulmonary effort is normal. No respiratory distress.     Breath sounds: Normal breath sounds. No stridor. No wheezing, rhonchi or rales.  Chest:     Chest wall: No tenderness.  Musculoskeletal: Normal range of motion.  Skin:    General: Skin is warm and dry.     Capillary Refill: Capillary refill takes less than 2 seconds.     Coloration: Skin is not jaundiced or pale.     Findings: No bruising, erythema, lesion or rash.  Neurological:     General: No focal deficit present.     Mental Status: She is alert and oriented to person, place, and time. Mental status is at baseline.  Psychiatric:        Mood and Affect: Mood normal.        Behavior: Behavior normal.        Thought Content: Thought content normal.        Judgment: Judgment normal.     Results for orders placed or performed in visit on 11/18/17  Bayer DCA Hb A1c Waived  Result Value Ref Range   HB A1C (BAYER DCA - WAIVED) 7.0 (H) <7.0 %  Comprehensive metabolic panel  Result Value Ref Range   Glucose 122 (H) 65 - 99 mg/dL   BUN 25 8 - 27 mg/dL   Creatinine, Ser 6.211.21 (H) 0.57 - 1.00 mg/dL   GFR calc non Af Amer 45 (L) >59 mL/min/1.73   GFR calc Af Amer 52 (L) >59 mL/min/1.73   BUN/Creatinine Ratio 21 12 - 28   Sodium 140 134 - 144 mmol/L    Potassium 4.5 3.5 - 5.2 mmol/L   Chloride 104 96 - 106 mmol/L   CO2 21 20 - 29 mmol/L   Calcium 9.2 8.7 - 10.3 mg/dL   Total Protein 6.8 6.0 - 8.5 g/dL   Albumin 4.4 3.5 - 4.8 g/dL   Globulin, Total 2.4 1.5 - 4.5 g/dL   Albumin/Globulin Ratio 1.8 1.2 - 2.2   Bilirubin Total 0.3 0.0 - 1.2 mg/dL   Alkaline Phosphatase 75 39 -  117 IU/L   AST 15 0 - 40 IU/L   ALT 11 0 - 32 IU/L  Lipid Panel w/o Chol/HDL Ratio  Result Value Ref Range   Cholesterol, Total 142 100 - 199 mg/dL   Triglycerides 161 0 - 149 mg/dL   HDL 51 >09 mg/dL   VLDL Cholesterol Cal 27 5 - 40 mg/dL   LDL Calculated 64 0 - 99 mg/dL      Assessment & Plan:   Problem List Items Addressed This Visit      Cardiovascular and Mediastinum   Senile purpura (HCC)    Reassured patient. Call with any concerns.       Relevant Medications   rosuvastatin (CRESTOR) 10 MG tablet   lisinopril-hydrochlorothiazide (PRINZIDE,ZESTORETIC) 20-25 MG tablet     Endocrine   Diabetes (HCC) - Primary    Not under good control with A1c of 8.8 because she was off her [place]. Has restarted it. Recheck 3 months. Call with any concerns.       Relevant Medications   sitaGLIPtin (JANUVIA) 100 MG tablet   rosuvastatin (CRESTOR) 10 MG tablet   metFORMIN (GLUCOPHAGE) 1000 MG tablet   lisinopril-hydrochlorothiazide (PRINZIDE,ZESTORETIC) 20-25 MG tablet   glipiZIDE (GLUCOTROL) 5 MG tablet   Other Relevant Orders   Bayer DCA Hb A1c Waived   Comprehensive metabolic panel   Microalbumin, Urine Waived   TSH   UA/M w/rflx Culture, Routine   Ambulatory referral to Ophthalmology     Genitourinary   Benign hypertensive renal disease    Under good control on current regimen. Continue current regimen. Continue to monitor. Call with any concerns. Refills given. Labs drawn today.       Relevant Orders   Comprehensive metabolic panel   Microalbumin, Urine Waived   TSH   UA/M w/rflx Culture, Routine     Other   Hyperlipidemia    Under good  control on current regimen. Continue current regimen. Continue to monitor. Call with any concerns. Refills given. Labs drawn today.       Relevant Medications   rosuvastatin (CRESTOR) 10 MG tablet   lisinopril-hydrochlorothiazide (PRINZIDE,ZESTORETIC) 20-25 MG tablet   Other Relevant Orders   Comprehensive metabolic panel   Lipid Panel w/o Chol/HDL Ratio   TSH   UA/M w/rflx Culture, Routine   Depression    Under good control on current regimen. Continue current regimen. Continue to monitor. Call with any concerns. Refills given. Labs drawn today.       Relevant Medications   buPROPion (WELLBUTRIN XL) 150 MG 24 hr tablet   Other Relevant Orders   Comprehensive metabolic panel   TSH   UA/M w/rflx Culture, Routine   Anemia    Labs drawn today. Await results.       Relevant Orders   CBC with Differential/Platelet   Comprehensive metabolic panel   TSH   UA/M w/rflx Culture, Routine    Other Visit Diagnoses    Immunization due       Flu shot given today.   Relevant Orders   Flu vaccine HIGH DOSE PF (Fluzone High dose) (Completed)       Follow up plan: Return in about 3 months (around 07/07/2018) for Physical.

## 2018-04-08 LAB — CBC WITH DIFFERENTIAL/PLATELET
Basophils Absolute: 0 10*3/uL (ref 0.0–0.2)
Basos: 0 %
EOS (ABSOLUTE): 0.1 10*3/uL (ref 0.0–0.4)
Eos: 2 %
Hematocrit: 30.7 % — ABNORMAL LOW (ref 34.0–46.6)
Hemoglobin: 10.3 g/dL — ABNORMAL LOW (ref 11.1–15.9)
Immature Grans (Abs): 0 10*3/uL (ref 0.0–0.1)
Immature Granulocytes: 0 %
Lymphocytes Absolute: 1.4 10*3/uL (ref 0.7–3.1)
Lymphs: 21 %
MCH: 29.5 pg (ref 26.6–33.0)
MCHC: 33.6 g/dL (ref 31.5–35.7)
MCV: 88 fL (ref 79–97)
Monocytes Absolute: 0.4 10*3/uL (ref 0.1–0.9)
Monocytes: 7 %
Neutrophils Absolute: 4.5 10*3/uL (ref 1.4–7.0)
Neutrophils: 70 %
Platelets: 225 10*3/uL (ref 150–450)
RBC: 3.49 x10E6/uL — ABNORMAL LOW (ref 3.77–5.28)
RDW: 13.1 % (ref 11.7–15.4)
WBC: 6.5 10*3/uL (ref 3.4–10.8)

## 2018-04-08 LAB — COMPREHENSIVE METABOLIC PANEL
ALT: 14 IU/L (ref 0–32)
AST: 10 IU/L (ref 0–40)
Albumin/Globulin Ratio: 1.7 (ref 1.2–2.2)
Albumin: 4.3 g/dL (ref 3.5–4.8)
Alkaline Phosphatase: 98 IU/L (ref 39–117)
BUN/Creatinine Ratio: 21 (ref 12–28)
BUN: 26 mg/dL (ref 8–27)
Bilirubin Total: 0.2 mg/dL (ref 0.0–1.2)
CO2: 21 mmol/L (ref 20–29)
CREATININE: 1.22 mg/dL — AB (ref 0.57–1.00)
Calcium: 9.1 mg/dL (ref 8.7–10.3)
Chloride: 102 mmol/L (ref 96–106)
GFR calc Af Amer: 52 mL/min/{1.73_m2} — ABNORMAL LOW (ref >59)
GFR calc non Af Amer: 45 mL/min/{1.73_m2} — ABNORMAL LOW (ref >59)
GLUCOSE: 260 mg/dL — AB (ref 65–99)
Globulin, Total: 2.5 g/dL (ref 1.5–4.5)
Potassium: 4.4 mmol/L (ref 3.5–5.2)
Sodium: 139 mmol/L (ref 134–144)
Total Protein: 6.8 g/dL (ref 6.0–8.5)

## 2018-04-08 LAB — LIPID PANEL W/O CHOL/HDL RATIO
CHOLESTEROL TOTAL: 139 mg/dL (ref 100–199)
HDL: 59 mg/dL (ref >39)
LDL Calculated: 61 mg/dL (ref 0–99)
Triglycerides: 95 mg/dL (ref 0–149)
VLDL CHOLESTEROL CAL: 19 mg/dL (ref 5–40)

## 2018-04-08 LAB — TSH: TSH: 2.72 u[IU]/mL (ref 0.450–4.500)

## 2018-06-30 ENCOUNTER — Telehealth: Payer: Self-pay

## 2018-06-30 NOTE — Telephone Encounter (Signed)
Patient scheduled for an AWV on 07/07/2018 with [facility], Due to Covid-19 pandemic this is unable to be done in office, called patient to see if they are able to do this virtually or if it needed to rescheduled for an in office for after June 2020. Patient unable to access video capabilities. Rescheduled for 6/10. Mailed appt reminder per pt request  

## 2018-07-07 ENCOUNTER — Encounter: Payer: Medicare Other | Admitting: Family Medicine

## 2018-07-07 ENCOUNTER — Ambulatory Visit: Payer: Medicare Other

## 2018-09-01 ENCOUNTER — Ambulatory Visit: Payer: Medicare Other

## 2018-09-15 ENCOUNTER — Encounter: Payer: Medicare Other | Admitting: Family Medicine

## 2018-10-08 ENCOUNTER — Encounter: Payer: Medicare Other | Admitting: Family Medicine

## 2018-11-06 ENCOUNTER — Other Ambulatory Visit: Payer: Self-pay | Admitting: Family Medicine

## 2018-11-07 NOTE — Telephone Encounter (Signed)
Requested medication (s) are due for refill today:   Yes  Requested medication (s) are on the active medication list:   Yes  Future visit scheduled:   Yes 11/18/2018 with Dr. Wynetta Emery   Last ordered: 04/07/2018  #180  1 refill  Returned because pt has cancelled last 4 appts.   Dr. Wynetta Emery to review for refills prior to appt.   Requested Prescriptions  Pending Prescriptions Disp Refills   metFORMIN (GLUCOPHAGE) 1000 MG tablet [Pharmacy Med Name: metFORMIN HCl 1000 MG Oral Tablet] 180 tablet 0    Sig: TAKE 1 TABLET BY MOUTH TWICE DAILY WITH A MEAL     Endocrinology:  Diabetes - Biguanides Failed - 11/06/2018  6:06 PM      Failed - Cr in normal range and within 360 days    Creatinine, Ser  Date Value Ref Range Status  04/07/2018 1.22 (H) 0.57 - 1.00 mg/dL Final         Failed - HBA1C is between 0 and 7.9 and within 180 days    Hemoglobin A1C  Date Value Ref Range Status  11/28/2015 7.5  Final   HB A1C (BAYER DCA - WAIVED)  Date Value Ref Range Status  04/07/2018 8.8 (H) <7.0 % Final    Comment:                                          Diabetic Adult            <7.0                                       Healthy Adult        4.3 - 5.7                                                           (DCCT/NGSP) American Diabetes Association's Summary of Glycemic Recommendations for Adults with Diabetes: Hemoglobin A1c <7.0%. More stringent glycemic goals (A1c <6.0%) may further reduce complications at the cost of increased risk of hypoglycemia.          Failed - eGFR in normal range and within 360 days    GFR calc Af Amer  Date Value Ref Range Status  04/07/2018 52 (L) >59 mL/min/1.73 Final   GFR calc non Af Amer  Date Value Ref Range Status  04/07/2018 45 (L) >59 mL/min/1.73 Final         Failed - Valid encounter within last 6 months    Recent Outpatient Visits          7 months ago Type 2 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 disease, without long-term current use of  insulin ([place])   [facility], Megan P, DO   11 months ago Type 2 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 disease, without long-term current use of insulin ([place])   [place], Megan P, DO   1 year ago Recurrent major depressive disorder, in partial remission ([place])   [place], Megan P, DO   1 year ago Type 2 diabetes mellitus with stage 2 chronic  kidney disease, without long-term current use of insulin ([place])   [employer], Harlan, DO   1 year ago Benign hypertensive renal disease   [facility] [place], Megan P, DO      Future Appointments            In 1 week [place], Megan P, DO [place], [facility]            omeprazole (PRILOSEC) 20 MG capsule [employer] Med Name: Omeprazole 20 MG Oral Capsule Delayed Release] 180 capsule 0    Sig: TAKE 2 CAPSULES BY MOUTH DAILY     Gastroenterology: Proton Pump Inhibitors Passed - 11/06/2018  6:06 PM      Passed - Valid encounter within last 12 months    Recent Outpatient Visits          7 months ago Type 2 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 disease, without long-term current use of insulin ([place])   [place], Megan P, DO   11 months ago Type 2 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 disease, without long-term current use of insulin ([place])   [place], Megan P, DO   1 year ago Recurrent major depressive disorder, in partial remission ([place])   [place], Megan P, DO   1 year ago Type 2 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 disease, without long-term current use of insulin ([place])   [place], [place]thorne, DO   1 year ago Benign hypertensive renal disease   [facility] [place], Barb Merino, DO      Future Appointments            In 1 week Wynetta Emery, Barb Merino, DO [employer], [facility]

## 2018-11-18 ENCOUNTER — Ambulatory Visit (INDEPENDENT_AMBULATORY_CARE_PROVIDER_SITE_OTHER): Payer: Medicare Other | Admitting: Family Medicine

## 2018-11-18 ENCOUNTER — Encounter: Payer: Self-pay | Admitting: Family Medicine

## 2018-11-18 ENCOUNTER — Other Ambulatory Visit: Payer: Self-pay

## 2018-11-18 VITALS — BP 167/84 | HR 86 | Temp 99.1°F | Ht 65.59 in | Wt 193.2 lb

## 2018-11-18 DIAGNOSIS — F3341 Major depressive disorder, recurrent, in partial remission: Secondary | ICD-10-CM

## 2018-11-18 DIAGNOSIS — I129 Hypertensive chronic kidney disease with stage 1 through stage 4 chronic kidney disease, or unspecified chronic kidney disease: Secondary | ICD-10-CM | POA: Diagnosis not present

## 2018-11-18 DIAGNOSIS — N182 Chronic kidney disease, stage 2 (mild): Secondary | ICD-10-CM

## 2018-11-18 DIAGNOSIS — Z0000 Encounter for general adult medical examination without abnormal findings: Secondary | ICD-10-CM

## 2018-11-18 DIAGNOSIS — Z23 Encounter for immunization: Secondary | ICD-10-CM | POA: Diagnosis not present

## 2018-11-18 DIAGNOSIS — E1122 Type 2 diabetes mellitus with diabetic chronic kidney disease: Secondary | ICD-10-CM

## 2018-11-18 DIAGNOSIS — D649 Anemia, unspecified: Secondary | ICD-10-CM

## 2018-11-18 DIAGNOSIS — D692 Other nonthrombocytopenic purpura: Secondary | ICD-10-CM | POA: Diagnosis not present

## 2018-11-18 DIAGNOSIS — S41101A Unspecified open wound of right upper arm, initial encounter: Secondary | ICD-10-CM

## 2018-11-18 DIAGNOSIS — E782 Mixed hyperlipidemia: Secondary | ICD-10-CM

## 2018-11-18 LAB — MICROSCOPIC EXAMINATION
Bacteria, UA: NONE SEEN
RBC, Urine: NONE SEEN /hpf (ref 0–2)
WBC, UA: NONE SEEN /hpf (ref 0–5)

## 2018-11-18 LAB — UA/M W/RFLX CULTURE, ROUTINE
Bilirubin, UA: NEGATIVE
Glucose, UA: NEGATIVE
Leukocytes,UA: NEGATIVE
Nitrite, UA: NEGATIVE
RBC, UA: NEGATIVE
Specific Gravity, UA: 1.02 (ref 1.005–1.030)
Urobilinogen, Ur: 0.2 mg/dL (ref 0.2–1.0)
pH, UA: 5 (ref 5.0–7.5)

## 2018-11-18 LAB — MICROALBUMIN, URINE WAIVED
Creatinine, Urine Waived: 200 mg/dL (ref 10–300)
Microalb, Ur Waived: 80 mg/L — ABNORMAL HIGH (ref 0–19)

## 2018-11-18 LAB — BAYER DCA HB A1C WAIVED: HB A1C (BAYER DCA - WAIVED): 7.1 % — ABNORMAL HIGH (ref <7.0)

## 2018-11-18 MED ORDER — GLIPIZIDE 5 MG PO TABS: ORAL_TABLET | ORAL | 1 refills | Status: DC

## 2018-11-18 MED ORDER — BUPROPION HCL ER (XL) 300 MG PO TB24: 300.0000 mg | ORAL_TABLET | Freq: Every day | ORAL | 1 refills | Status: DC

## 2018-11-18 MED ORDER — ROSUVASTATIN CALCIUM 10 MG PO TABS: 10.0000 mg | ORAL_TABLET | Freq: Every day | ORAL | 1 refills | Status: DC

## 2018-11-18 MED ORDER — METFORMIN HCL 1000 MG PO TABS: 1000.0000 mg | ORAL_TABLET | Freq: Two times a day (BID) | ORAL | 1 refills | Status: DC

## 2018-11-18 MED ORDER — LISINOPRIL-HYDROCHLOROTHIAZIDE 20-25 MG PO TABS: 2.0000 | ORAL_TABLET | Freq: Every day | ORAL | 1 refills | Status: DC

## 2018-11-18 MED ORDER — OMEPRAZOLE 20 MG PO CPDR: 40.0000 mg | DELAYED_RELEASE_CAPSULE | Freq: Every day | ORAL | 1 refills | Status: DC

## 2018-11-18 NOTE — Assessment & Plan Note (Signed)
Under good control on current regimen. Continue current regimen. Continue to monitor. Call with any concerns. Refills given. Labs drawn today.   

## 2018-11-18 NOTE — Assessment & Plan Note (Signed)
Reassured patient. Call with any concerns.  

## 2018-11-18 NOTE — Assessment & Plan Note (Signed)
Not under good control, will increase her wellbutrin to 300mg  and recheck 1 month. Call with any concerns.

## 2018-11-18 NOTE — Progress Notes (Signed)
BP (!) 167/84   Pulse 86   Temp 99.1 F (37.3 C)   Ht 5' 5.59" (1.666 m)   Wt 193 lb 4 oz (87.7 kg)   SpO2 99%   BMI 31.58 kg/m    Subjective:    Patient ID: Alison Hernandez, female    DOB: 04-10-1945, 73 y.o.   MRN: [id number]  HPI: [name] is a 73 y.o. female presenting on 11/18/2018 for comprehensive medical examination. Current medical complaints include:  DIABETES Hypoglycemic episodes:no Polydipsia/polyuria: no Visual disturbance: no Chest pain: no Paresthesias: no Glucose Monitoring: no  Accucheck frequency: Not Checking Taking Insulin?: no Blood Pressure Monitoring: not checking Retinal Examination: Up to Date Foot Exam: Up to Date Diabetic Education: Completed Pneumovax: Up to Date Influenza: Done today Aspirin: yes  HYPERTENSION / HYPERLIPIDEMIA Satisfied with current treatment? no Duration of hypertension: chronic BP monitoring frequency: not checking BP medication side effects: no Past BP meds: lisinopril-HCTZ Duration of hyperlipidemia: chronic Cholesterol medication side effects: no Cholesterol supplements: none Past cholesterol medications: crestor Medication compliance: excellent compliance Aspirin: no Recent stressors: yes Recurrent headaches: no Visual changes: no Palpitations: no Dyspnea: no Chest pain: no Lower extremity edema: no Dizzy/lightheaded: no  DEPRESSION- under a lot of stress. Stuff at home is really hard.  Mood status: exacerbated Satisfied with current treatment?: no Symptom severity: mild  Duration of current treatment : chronic Side effects: no Medication compliance: good compliance Psychotherapy/counseling: no  Previous psychiatric medications: wellbutrin Depressed mood: yes Anxious mood: yes Anhedonia: no Significant weight loss or gain: no Insomnia: no  Fatigue: yes Feelings of worthlessness or guilt: yes Impaired concentration/indecisiveness: yes Suicidal ideations: no Hopelessness: no Crying  spells: yes Depression screen [facility] 2/9 11/18/2018 04/07/2018 11/18/2017 07/15/2017 04/08/2017  Decreased Interest - 0 0 0 0  Down, Depressed, Hopeless 0 0 0 0 0  PHQ - 2 Score 0 0 0 0 0  Altered sleeping 0 0 0 0 0  Tired, decreased energy 0 0 0 0 0  Change in appetite 0 0 0 0 0  Feeling bad or failure about yourself  0 0 0 0 0  Trouble concentrating 0 0 0 0 0  Moving slowly or fidgety/restless 0 0 0 0 0  Suicidal thoughts 0 0 0 0 0  PHQ-9 Score 0 0 0 0 0  Difficult doing work/chores Not difficult at all Not difficult at all Not difficult at all Not difficult at all Not difficult at all   Menopausal Symptoms: no  Functional Status Survey: Is the patient deaf or have difficulty hearing?: No Does the patient have difficulty seeing, even when wearing glasses/contacts?: No Does the patient have difficulty concentrating, remembering, or making decisions?: No Does the patient have difficulty walking or climbing stairs?: No Does the patient have difficulty dressing or bathing?: No Does the patient have difficulty doing errands alone such as visiting a doctor's office or shopping?: No  Fall Risk  11/18/2018 07/15/2017 04/08/2017 11/06/2016 06/04/2016  Falls in the past year? 0 Yes Yes Yes No  Number falls in past yr: 0 1 1 1  -  Injury with Fall? 0 Yes Yes Yes -  Risk Factor Category  - - (No Data) - -  Comment - - Accident- not high fall risk - -  Risk for fall due to : - Impaired balance/gait - - -  Follow up - Falls evaluation completed;Falls prevention discussed;Education provided Education provided Falls prevention discussed -    Depression Screen Depression screen [facility] 2/9  11/18/2018 04/07/2018 11/18/2017 07/15/2017 04/08/2017  Decreased Interest - 0 0 0 0  Down, Depressed, Hopeless 0 0 0 0 0  PHQ - 2 Score 0 0 0 0 0  Altered sleeping 0 0 0 0 0  Tired, decreased energy 0 0 0 0 0  Change in appetite 0 0 0 0 0  Feeling bad or failure about yourself  0 0 0 0 0  Trouble concentrating 0 0 0 0 0   Moving slowly or fidgety/restless 0 0 0 0 0  Suicidal thoughts 0 0 0 0 0  PHQ-9 Score 0 0 0 0 0  Difficult doing work/chores Not difficult at all Not difficult at all Not difficult at all Not difficult at all Not difficult at all   Advanced Directives Does patient have a HCPOA?    no Does patient have a living will or MOST form?  no  Past Medical History:  Past Medical History:  Diagnosis Date  . Diabetes mellitus without complication ([place])   . Hyperlipidemia   . Hypertension     Surgical History:  Past Surgical History:  Procedure Laterality Date  . ABDOMINAL HYSTERECTOMY     total  . BREAST BIOPSY Left 20+ yrs ago   benign  . BREAST BIOPSY Left 20+ yrs ago   benign  . ROTATOR CUFF REPAIR Right 09/30/2016  . SIGMOIDOSCOPY  1990    Medications:  Current Outpatient Medications on File Prior to Visit  Medication Sig  . baclofen (LIORESAL) 10 MG tablet Take 1 tablet (10 mg total) by mouth 3 (three) times daily.  . Blood Glucose Monitoring Suppl (ONE TOUCH ULTRA 2) w/Device KIT Test 3x a day  . diclofenac sodium (VOLTAREN) 1 % GEL Apply 4 g topically 4 (four) times daily.  . meclizine (ANTIVERT) 25 MG tablet Take 1 tablet (25 mg total) by mouth 3 (three) times daily as needed for dizziness.  . Multiple Vitamin (MULTIVITAMIN) tablet Take 1 tablet by mouth daily.  Marland Kitchen nystatin (MYCOSTATIN/NYSTOP) powder Apply topically 4 (four) times daily.  . ondansetron (ZOFRAN-ODT) 8 MG disintegrating tablet Take 1 tablet (8 mg total) by mouth every 8 (eight) hours as needed for nausea or vomiting.  . ONE TOUCH ULTRA TEST test strip Test 3x a day  . ONETOUCH DELICA LANCETS 16X MISC Test 3x a day  . sitaGLIPtin (JANUVIA) 100 MG tablet Take 1 tablet (100 mg total) by mouth daily. (Patient not taking: Reported on 11/18/2018)   No current facility-administered medications on file prior to visit.     Allergies:  Allergies  Allergen Reactions  . Lovastatin Nausea And Vomiting  . Bydureon  [Exenatide] Rash    Social History:  Social History   Socioeconomic History  . Marital status: Married    Spouse name: Not on file  . Number of children: Not on file  . Years of education: Not on file  . Highest education level: Not on file  Occupational History  . Not on file  Social Needs  . Financial resource strain: Not on file  . Food insecurity    Worry: Not on file    Inability: Not on file  . Transportation needs    Medical: Not on file    Non-medical: Not on file  Tobacco Use  . Smoking status: Never Smoker  . Smokeless tobacco: Never Used  Substance and Sexual Activity  . Alcohol use: No  . Drug use: No  . Sexual activity: Not Currently    Birth control/protection: None  Lifestyle  . Physical activity    Days per week: Not on file    Minutes per session: Not on file  . Stress: Not on file  Relationships  . Social Herbalist on phone: Not on file    Gets together: Not on file    Attends religious service: Not on file    Active member of club or organization: Not on file    Attends meetings of clubs or organizations: Not on file    Relationship status: Not on file  . Intimate partner violence    Fear of current or ex partner: Not on file    Emotionally abused: Not on file    Physically abused: Not on file    Forced sexual activity: Not on file  Other Topics Concern  . Not on file  Social History Narrative  . Not on file   Social History   Tobacco Use  Smoking Status Never Smoker  Smokeless Tobacco Never Used   Social History   Substance and Sexual Activity  Alcohol Use No    Family History:  Family History  Problem Relation Age of Onset  . Diabetes Mother   . Heart disease Mother   . Cancer Father        esophagus/ lung  . Diabetes Maternal Grandmother   . Cancer Sister        Ovarian    Past medical history, surgical history, medications, allergies, family history and social history reviewed with patient today and changes  made to appropriate areas of the chart.   Review of Systems  Constitutional: Negative.   HENT: Negative.   Eyes: Negative.   Respiratory: Negative.   Cardiovascular: Negative.   Gastrointestinal: Negative.   Genitourinary: Negative.   Musculoskeletal: Negative.   Skin: Negative.   Neurological: Negative.   Endo/Heme/Allergies: Negative for environmental allergies and polydipsia. Bruises/bleeds easily.  Psychiatric/Behavioral: Positive for depression. Negative for hallucinations, memory loss, substance abuse and suicidal ideas. The patient is nervous/anxious. The patient does not have insomnia.     All other ROS negative except what is listed above and in the HPI.      Objective:    BP (!) 167/84   Pulse 86   Temp 99.1 F (37.3 C)   Ht 5' 5.59" (1.666 m)   Wt 193 lb 4 oz (87.7 kg)   SpO2 99%   BMI 31.58 kg/m   Wt Readings from Last 3 Encounters:  11/18/18 193 lb 4 oz (87.7 kg)  04/07/18 199 lb 3 oz (90.4 kg)  11/18/17 194 lb 8 oz (88.2 kg)    Physical Exam Vitals signs and nursing note reviewed.  Constitutional:      General: She is not in acute distress.    Appearance: Normal appearance. She is not ill-appearing, toxic-appearing or diaphoretic.  HENT:     Head: Normocephalic and atraumatic.     Right Ear: Tympanic membrane, ear canal and external ear normal. There is no impacted cerumen.     Left Ear: Tympanic membrane, ear canal and external ear normal. There is no impacted cerumen.     Nose: Nose normal. No congestion or rhinorrhea.     Mouth/Throat:     Mouth: Mucous membranes are moist.     Pharynx: Oropharynx is clear. No oropharyngeal exudate or posterior oropharyngeal erythema.  Eyes:     General: No scleral icterus.       Right eye: No discharge.  Left eye: No discharge.     Extraocular Movements: Extraocular movements intact.     Conjunctiva/sclera: Conjunctivae normal.     Pupils: Pupils are equal, round, and reactive to light.  Neck:      Musculoskeletal: Normal range of motion and neck supple. No neck rigidity or muscular tenderness.     Vascular: No carotid bruit.  Cardiovascular:     Rate and Rhythm: Normal rate and regular rhythm.     Pulses: Normal pulses.     Heart sounds: No murmur. No friction rub. No gallop.   Pulmonary:     Effort: Pulmonary effort is normal. No respiratory distress.     Breath sounds: Normal breath sounds. No stridor. No wheezing, rhonchi or rales.  Chest:     Chest wall: No tenderness.  Abdominal:     General: Abdomen is flat. Bowel sounds are normal. There is no distension.     Palpations: Abdomen is soft. There is no mass.     Tenderness: There is no abdominal tenderness. There is no right CVA tenderness, left CVA tenderness, guarding or rebound.     Hernia: No hernia is present.  Genitourinary:    Comments: Breast and pelvic exams deferred with shared decision making Musculoskeletal:        General: No swelling, tenderness, deformity or signs of injury.     Right lower leg: No edema.     Left lower leg: No edema.  Lymphadenopathy:     Cervical: No cervical adenopathy.  Skin:    General: Skin is warm and dry.     Capillary Refill: Capillary refill takes less than 2 seconds.     Coloration: Skin is not jaundiced or pale.     Findings: No bruising, erythema, lesion or rash.     Comments: Well healing wound on R arm consistent with bug bite  Neurological:     General: No focal deficit present.     Mental Status: She is alert and oriented to person, place, and time. Mental status is at baseline.     Cranial Nerves: No cranial nerve deficit.     Sensory: No sensory deficit.     Motor: No weakness.     Coordination: Coordination normal.     Gait: Gait normal.     Deep Tendon Reflexes: Reflexes normal.  Psychiatric:        Mood and Affect: Mood normal.        Behavior: Behavior normal.        Thought Content: Thought content normal.        Judgment: Judgment normal.     6CIT  Screen 11/18/2018 11/06/2016  What Year? 0 points 0 points  What month? 0 points 0 points  What time? 0 points 0 points  Count back from 20 0 points 0 points  Months in reverse 0 points 0 points  Repeat phrase 0 points 0 points  Total Score 0 0    Results for orders placed or performed in visit on 04/07/18  Microscopic Examination   URINE  Result Value Ref Range   WBC, UA 0-5 0 - 5 /hpf   RBC, UA None seen 0 - 2 /hpf   Epithelial Cells (non renal) 0-10 0 - 10 /hpf   Bacteria, UA None seen None seen/Few  Bayer DCA Hb A1c Waived  Result Value Ref Range   HB A1C (BAYER DCA - WAIVED) 8.8 (H) <7.0 %  CBC with Differential/Platelet  Result Value Ref Range  WBC 6.5 3.4 - 10.8 x10E3/uL   RBC 3.49 (L) 3.77 - 5.28 x10E6/uL   Hemoglobin 10.3 (L) 11.1 - 15.9 g/dL   Hematocrit 30.7 (L) 34.0 - 46.6 %   MCV 88 79 - 97 fL   MCH 29.5 26.6 - 33.0 pg   MCHC 33.6 31.5 - 35.7 g/dL   RDW 13.1 11.7 - 15.4 %   Platelets 225 150 - 450 x10E3/uL   Neutrophils 70 Not Estab. %   Lymphs 21 Not Estab. %   Monocytes 7 Not Estab. %   Eos 2 Not Estab. %   Basos 0 Not Estab. %   Neutrophils Absolute 4.5 1.4 - 7.0 x10E3/uL   Lymphocytes Absolute 1.4 0.7 - 3.1 x10E3/uL   Monocytes Absolute 0.4 0.1 - 0.9 x10E3/uL   EOS (ABSOLUTE) 0.1 0.0 - 0.4 x10E3/uL   Basophils Absolute 0.0 0.0 - 0.2 x10E3/uL   Immature Granulocytes 0 Not Estab. %   Immature Grans (Abs) 0.0 0.0 - 0.1 x10E3/uL  Comprehensive metabolic panel  Result Value Ref Range   Glucose 260 (H) 65 - 99 mg/dL   BUN 26 8 - 27 mg/dL   Creatinine, Ser 1.22 (H) 0.57 - 1.00 mg/dL   GFR calc non Af Amer 45 (L) >59 mL/min/1.73   GFR calc Af Amer 52 (L) >59 mL/min/1.73   BUN/Creatinine Ratio 21 12 - 28   Sodium 139 134 - 144 mmol/L   Potassium 4.4 3.5 - 5.2 mmol/L   Chloride 102 96 - 106 mmol/L   CO2 21 20 - 29 mmol/L   Calcium 9.1 8.7 - 10.3 mg/dL   Total Protein 6.8 6.0 - 8.5 g/dL   Albumin 4.3 3.5 - 4.8 g/dL   Globulin, Total 2.5 1.5 - 4.5 g/dL    Albumin/Globulin Ratio 1.7 1.2 - 2.2   Bilirubin Total 0.2 0.0 - 1.2 mg/dL   Alkaline Phosphatase 98 39 - 117 IU/L   AST 10 0 - 40 IU/L   ALT 14 0 - 32 IU/L  Lipid Panel w/o Chol/HDL Ratio  Result Value Ref Range   Cholesterol, Total 139 100 - 199 mg/dL   Triglycerides 95 0 - 149 mg/dL   HDL 59 >39 mg/dL   VLDL Cholesterol Cal 19 5 - 40 mg/dL   LDL Calculated 61 0 - 99 mg/dL  Microalbumin, Urine Waived  Result Value Ref Range   Microalb, Ur Waived 150 (H) 0 - 19 mg/L   Creatinine, Urine Waived 200 10 - 300 mg/dL   Microalb/Creat Ratio 30-300 (H) <30 mg/g  TSH  Result Value Ref Range   TSH 2.720 0.450 - 4.500 uIU/mL  UA/M w/rflx Culture, Routine   Specimen: Urine   URINE  Result Value Ref Range   Specific Gravity, UA 1.025 1.005 - 1.030   pH, UA 6.5 5.0 - 7.5   Color, UA Yellow Yellow   Appearance Ur Clear Clear   Leukocytes, UA Negative Negative   Protein, UA 1+ (A) Negative/Trace   Glucose, UA Trace (A) Negative   Ketones, UA Negative Negative   RBC, UA Negative Negative   Bilirubin, UA Negative Negative   Urobilinogen, Ur 0.2 0.2 - 1.0 mg/dL   Nitrite, UA Negative Negative   Microscopic Examination See below:       Assessment & Plan:   Problem List Items Addressed This Visit      Cardiovascular and Mediastinum   Senile purpura ([place])    Reassured patient. Call with any concerns.  Relevant Medications   rosuvastatin (CRESTOR) 10 MG tablet   lisinopril-hydrochlorothiazide (ZESTORETIC) 20-25 MG tablet   Other Relevant Orders   CBC with Differential/Platelet   Comprehensive metabolic panel   TSH   UA/M w/rflx Culture, Routine     Endocrine   Diabetes ([place])    Under good control on current regimen with A1c of 6.1. Continue current regimen. Continue to monitor. Call with any concerns. Refills given.        Relevant Medications   rosuvastatin (CRESTOR) 10 MG tablet   metFORMIN (GLUCOPHAGE) 1000 MG tablet   lisinopril-hydrochlorothiazide (ZESTORETIC)  20-25 MG tablet   glipiZIDE (GLUCOTROL) 5 MG tablet   Other Relevant Orders   Bayer DCA Hb A1c Waived   CBC with Differential/Platelet   Comprehensive metabolic panel   Microalbumin, Urine Waived   TSH   UA/M w/rflx Culture, Routine     Genitourinary   Benign hypertensive renal disease    Slightly elevated today- possibly due to anxiety. Will continue current regimen and recheck 1 month.       Relevant Orders   CBC with Differential/Platelet   Comprehensive metabolic panel   Microalbumin, Urine Waived   TSH   UA/M w/rflx Culture, Routine     Other   Hyperlipidemia    Under good control on current regimen. Continue current regimen. Continue to monitor. Call with any concerns. Refills given. Labs drawn today.       Relevant Medications   rosuvastatin (CRESTOR) 10 MG tablet   lisinopril-hydrochlorothiazide (ZESTORETIC) 20-25 MG tablet   Other Relevant Orders   CBC with Differential/Platelet   Comprehensive metabolic panel   Lipid Panel w/o Chol/HDL Ratio   TSH   UA/M w/rflx Culture, Routine   Depression    Not under good control, will increase her wellbutrin to 346m and recheck 1 month. Call with any concerns.       Relevant Medications   buPROPion (WELLBUTRIN XL) 300 MG 24 hr tablet   Other Relevant Orders   CBC with Differential/Platelet   Comprehensive metabolic panel   TSH   UA/M w/rflx Culture, Routine   Anemia    Rechecking levels today. Await results.       Relevant Orders   CBC with Differential/Platelet   Comprehensive metabolic panel   TSH   UA/M w/rflx Culture, Routine    Other Visit Diagnoses    Medicare annual wellness visit, subsequent    -  Primary   Preventative care discussed today as below.    Routine general medical examination at a health care facility       Vaccines up to date. Screening labs checked today. Pap N/A. Mammogram and DEXA ordered today. Do cologuard. Call with any concerns.    Open wound of right upper arm, initial  encounter       Wound healing well. Due for TD- given today.   Relevant Orders   Referral to Chronic Care Management Services   Needs flu shot       Flu shot given today.   Relevant Orders   Flu Vaccine QUAD High Dose(Fluad) (Completed)       Preventative Services:  Health Risk Assessment and Personalized Prevention Plan: Done today Bone Mass Measurements: Ordered today Breast Cancer Screening: Ordered today CVD Screening: Done today Cervical Cancer Screening: N/A Colon Cancer Screening: Do your cologuard Depression Screening: Done today Diabetes Screening:  Done today Glaucoma Screening: See your eye doctor Hepatitis B vaccine: N/A Hepatitis C screening:  HIV Screening: Flu Vaccine: Lung  cancer Screening: Obesity Screening:  Pneumonia Vaccines (2): STI Screening:  Follow up plan: Return in about 4 weeks (around 12/16/2018).   LABORATORY TESTING:  - Pap smear: not applicable  IMMUNIZATIONS:   - Tdap: Tetanus vaccination status reviewed: Td vaccination indicated and given today. - Influenza: Administered today - Pneumovax: Up to date - Prevnar: Up to date - Zostavax vaccine: Given elsewhere  SCREENING: -Mammogram: Ordered today  - Colonoscopy: Ordered today- has cologuard at home - Bone Density: Ordered today   PATIENT COUNSELING:   Advised to take 1 mg of folate supplement per day if capable of pregnancy.   Sexuality: Discussed sexually transmitted diseases, partner selection, use of condoms, avoidance of unintended pregnancy  and contraceptive alternatives.   Advised to avoid cigarette smoking.  I discussed with the patient that most people either abstain from alcohol or drink within safe limits (<=14/week and <=4 drinks/occasion for males, <=7/weeks and <= 3 drinks/occasion for females) and that the risk for alcohol disorders and other health effects rises proportionally with the number of drinks per week and how often a drinker exceeds daily limits.   Discussed cessation/primary prevention of drug use and availability of treatment for abuse.   Diet: Encouraged to adjust caloric intake to maintain  or achieve ideal body weight, to reduce intake of dietary saturated fat and total fat, to limit sodium intake by avoiding high sodium foods and not adding table salt, and to maintain adequate dietary potassium and calcium preferably from fresh fruits, vegetables, and low-fat dairy products.    stressed the importance of regular exercise  Injury prevention: Discussed safety belts, safety helmets, smoke detector, smoking near bedding or upholstery.   Dental health: Discussed importance of regular tooth brushing, flossing, and dental visits.    NEXT PREVENTATIVE PHYSICAL DUE IN 1 YEAR. Return in about 4 weeks (around 12/16/2018).

## 2018-11-18 NOTE — Assessment & Plan Note (Signed)
Under good control on current regimen with A1c of 6.1. Continue current regimen. Continue to monitor. Call with any concerns. Refills given.

## 2018-11-18 NOTE — Assessment & Plan Note (Signed)
Slightly elevated today- possibly due to anxiety. Will continue current regimen and recheck 1 month.

## 2018-11-18 NOTE — Patient Instructions (Addendum)
[facility] at [facility]  Address: [address], [place], [place] [address]  Phone: [phone]  Health Maintenance After Age 71 After age 46, you are at a higher risk for certain long-term diseases and infections as well as injuries from falls. Falls are a major cause of broken bones and head injuries in people who are older than age 71. Getting regular preventive care can help to keep you healthy and well. Preventive care includes getting regular testing and making lifestyle changes as recommended by your health care provider. Talk with your health care provider about:  Which screenings and tests you should have. A screening is a test that checks for a disease when you have no symptoms.  A diet and exercise plan that is right for you. What should I know about screenings and tests to prevent falls? Screening and testing are the best ways to find a health problem early. Early diagnosis and treatment give you the best chance of managing medical conditions that are common after age 71. Certain conditions and lifestyle choices may make you more likely to have a fall. Your health care provider may recommend:  Regular vision checks. Poor vision and conditions such as cataracts can make you more likely to have a fall. If you wear glasses, make sure to get your prescription updated if your vision changes.  Medicine review. Work with your health care provider to regularly review all of the medicines you are taking, including over-the-counter medicines. Ask your health care provider about any side effects that may make you more likely to have a fall. Tell your health care provider if any medicines that you take make you feel dizzy or sleepy.  Osteoporosis screening. Osteoporosis is a condition that causes the bones to get weaker. This can make the bones weak and cause them to break more easily.  Blood pressure screening. Blood pressure changes and medicines to control blood  pressure can make you feel dizzy.  Strength and balance checks. Your health care provider may recommend certain tests to check your strength and balance while standing, walking, or changing positions.  Foot health exam. Foot pain and numbness, as well as not wearing proper footwear, can make you more likely to have a fall.  Depression screening. You may be more likely to have a fall if you have a fear of falling, feel emotionally low, or feel unable to do activities that you used to do.  Alcohol use screening. Using too much alcohol can affect your balance and may make you more likely to have a fall. What actions can I take to lower my risk of falls? General instructions  Talk with your health care provider about your risks for falling. Tell your health care provider if: ? You fall. Be sure to tell your health care provider about all falls, even ones that seem minor. ? You feel dizzy, sleepy, or off-balance.  Take over-the-counter and prescription medicines only as told by your health care provider. These include any supplements.  Eat a healthy diet and maintain a healthy weight. A healthy diet includes low-fat dairy products, low-fat (lean) meats, and fiber from whole grains, beans, and lots of fruits and vegetables. Home safety  Remove any tripping hazards, such as rugs, cords, and clutter.  Install safety equipment such as grab bars in bathrooms and safety rails on stairs.  Keep rooms and walkways well-lit. Activity   Follow a regular exercise program to stay fit. This will help you maintain your  balance. Ask your health care provider what types of exercise are appropriate for you.  If you need a cane or walker, use it as recommended by your health care provider.  Wear supportive shoes that have nonskid soles. Lifestyle  Do not drink alcohol if your health care provider tells you not to drink.  If you drink alcohol, limit how much you have: ? 0-1 drink a day for women. ?  0-2 drinks a day for men.  Be aware of how much alcohol is in your drink. In the [place] one drink equals one typical bottle of beer (12 oz), one-half glass of wine (5 oz), or one shot of hard liquor (1 oz).  Do not use any products that contain nicotine or tobacco, such as cigarettes and e-cigarettes. If you need help quitting, ask your health care provider. Summary  Having a healthy lifestyle and getting preventive care can help to protect your health and wellness after age 71.  Screening and testing are the best way to find a health problem early and help you avoid having a fall. Early diagnosis and treatment give you the best chance for managing medical conditions that are more common for people who are older than age 71.  Falls are a major cause of broken bones and head injuries in people who are older than age 71. Take precautions to prevent a fall at home.  Work with your health care provider to learn what changes you can make to improve your health and wellness and to prevent falls. This information is not intended to replace advice given to you by your health care provider. Make sure you discuss any questions you have with your health care provider. Document Released: 01/21/2017 Document Revised: 07/01/2018 Document Reviewed: 01/21/2017 Elsevier Patient Education  2020 Elsevier Inc. Td Vaccine (Tetanus and Diphtheria): What You Need to Know 1. Why get vaccinated? Tetanus  and diphtheria are very serious diseases. They are rare in the [place] today, but people who do become infected often have severe complications. Td vaccine is used to protect adolescents and adults from both of these diseases. Both tetanus and diphtheria are infections caused by bacteria. Diphtheria spreads from person to person through coughing or sneezing. Tetanus-causing bacteria enter the body through cuts, scratches, or wounds. TETANUS (Lockjaw) causes painful muscle tightening and stiffness, usually all  over the body.  It can lead to tightening of muscles in the head and neck so you can't open your mouth, swallow, or sometimes even breathe. Tetanus kills about 1 out of every 10 people who are infected even after receiving the best medical care. DIPHTHERIA can cause a thick coating to form in the back of the throat.  It can lead to breathing problems, paralysis, heart failure, and death. Before vaccines, as many as 200,000 cases of diphtheria and hundreds of cases of tetanus were reported in the [place] each year. Since vaccination began, reports of cases for both diseases have dropped by about 99%. 2. Td vaccine Td vaccine can protect adolescents and adults from tetanus and diphtheria. Td is usually given as a booster dose every 10 years but it can also be given earlier after a severe and dirty wound or burn. Another vaccine, called Tdap, which protects against pertussis in addition to tetanus and diphtheria, is sometimes recommended instead of Td vaccine. Your doctor or the person giving you the vaccine can give you more information. Td may safely be given at the same time as other vaccines. 3. Some  people should not get this vaccine  A person who has ever had a life-threatening allergic reaction after a previous dose of any tetanus or diphtheria containing vaccine, OR has a severe allergy to any part of this vaccine, should not get Td vaccine. Tell the person giving the vaccine about any severe allergies.  Talk to your doctor if you: ? had severe pain or swelling after any vaccine containing diphtheria or tetanus, ? ever had a condition called Guillain Barr Syndrome (GBS), ? aren't feeling well on the day the shot is scheduled. 4. Risks of a vaccine reaction With any medicine, including vaccines, there is a chance of side effects. These are usually mild and go away on their own. Serious reactions are also possible but are rare. Most people who get Td vaccine do not have any problems  with it. Mild Problems following Td vaccine: (Did not interfere with activities)  Pain where the shot was given (about 8 people in 10)  Redness or swelling where the shot was given (about 1 person in 4)  Mild fever (rare)  Headache (about 1 person in 4)  Tiredness (about 1 person in 4) Moderate Problems following Td vaccine: (Interfered with activities, but did not require medical attention)  Fever over 102F (rare) Severe Problems following Td vaccine: (Unable to perform usual activities; required medical attention)  Swelling, severe pain, bleeding and/or redness in the arm where the shot was given (rare). Problems that could happen after any vaccine:  People sometimes faint after a medical procedure, including vaccination. Sitting or lying down for about 15 minutes can help prevent fainting, and injuries caused by a fall. Tell your doctor if you feel dizzy, or have vision changes or ringing in the ears.  Some people get severe pain in the shoulder and have difficulty moving the arm where a shot was given. This happens very rarely.  Any medication can cause a severe allergic reaction. Such reactions from a vaccine are very rare, estimated at fewer than 1 in a million doses, and would happen within a few minutes to a few hours after the vaccination. As with any medicine, there is a very remote chance of a vaccine causing a serious injury or death. The safety of vaccines is always being monitored. For more information, visit: http://floyd.org/www.cdc.gov/vaccinesafety/ 5. What if there is a serious reaction? What should I look for?  Look for anything that concerns you, such as signs of a severe allergic reaction, very high fever, or unusual behavior. Signs of a severe allergic reaction can include hives, swelling of the face and throat, difficulty breathing, a fast heartbeat, dizziness, and weakness. These would usually start a few minutes to a few hours after the vaccination. What should I do?   If you think it is a severe allergic reaction or other emergency that can't wait, call 9-1-1 or get the person to the nearest hospital. Otherwise, call your doctor.  Afterward, the reaction should be reported to the Vaccine Adverse Event Reporting System (VAERS). Your doctor might file this report, or you can do it yourself through the VAERS web site at www.vaers.LAgents.nohhs.gov, or by calling 1-813-816-0089. VAERS does not give medical advice. 6. The National Vaccine Injury Compensation Program The [employer] Vaccine Injury Compensation Program (VICP) is a federal program that was created to compensate people who may have been injured by certain vaccines. Persons who believe they may have been injured by a vaccine can learn about the program and about filing a claim by calling 1-(831) 656-9278  or visiting the VICP website at SpiritualWord.at. There is a time limit to file a claim for compensation. 7. How can I learn more?  Ask your doctor. He or she can give you the vaccine package insert or suggest other sources of information.  Call your local or state health department.  Contact the Centers for Disease Control and Prevention (CDC): ? Call (906)554-6774 (1-800-CDC-INFO) ? Visit CDC's website at PicCapture.uy Vaccine Information Statement Td Vaccine (07/03/15) This information is not intended to replace advice given to you by your health care provider. Make sure you discuss any questions you have with your health care provider. Document Released: 01/05/2006 Document Revised: 10/26/2017 Document Reviewed: 10/26/2017 Elsevier Interactive Patient Education  2020 Elsevier Inc. Influenza (Flu) Vaccine (Inactivated or Recombinant): What You Need to Know 1. Why get vaccinated? Influenza vaccine can prevent influenza (flu). Flu is a contagious disease that spreads around the [place] every year, usually between October and May. Anyone can get the flu, but it is more dangerous for  some people. Infants and young children, people 72 years of age and older, pregnant women, and people with certain health conditions or a weakened immune system are at greatest risk of flu complications. Pneumonia, bronchitis, sinus infections and ear infections are examples of flu-related complications. If you have a medical condition, such as heart disease, cancer or diabetes, flu can make it worse. Flu can cause fever and chills, sore throat, muscle aches, fatigue, cough, headache, and runny or stuffy nose. Some people may have vomiting and diarrhea, though this is more common in children than adults. Each year thousands of people in the [place] die from flu, and many more are hospitalized. Flu vaccine prevents millions of illnesses and flu-related visits to the doctor each year. 2. Influenza vaccine CDC recommends everyone 38 months of age and older get vaccinated every flu season. Children 6 months through 11 years of age may need 2 doses during a single flu season. Everyone else needs only 1 dose each flu season. It takes about 2 weeks for protection to develop after vaccination. There are many flu viruses, and they are always changing. Each year a new flu vaccine is made to protect against three or four viruses that are likely to cause disease in the upcoming flu season. Even when the vaccine doesn't exactly match these viruses, it may still provide some protection. Influenza vaccine does not cause flu. Influenza vaccine may be given at the same time as other vaccines. 3. Talk with your health care provider Tell your vaccine provider if the person getting the vaccine:  Has had an allergic reaction after a previous dose of influenza vaccine, or has any severe, life-threatening allergies.  Has ever had Guillain-Barr Syndrome (also called GBS). In some cases, your health care provider may decide to postpone influenza vaccination to a future visit. People with minor illnesses, such as a  cold, may be vaccinated. People who are moderately or severely ill should usually wait until they recover before getting influenza vaccine. Your health care provider can give you more information. 4. Risks of a vaccine reaction  Soreness, redness, and swelling where shot is given, fever, muscle aches, and headache can happen after influenza vaccine.  There may be a very small increased risk of Guillain-Barr Syndrome (GBS) after inactivated influenza vaccine (the flu shot). Young children who get the flu shot along with pneumococcal vaccine (PCV13), and/or DTaP vaccine at the same time might be slightly more likely to have a seizure caused  by fever. Tell your health care provider if a child who is getting flu vaccine has ever had a seizure. People sometimes faint after medical procedures, including vaccination. Tell your provider if you feel dizzy or have vision changes or ringing in the ears. As with any medicine, there is a very remote chance of a vaccine causing a severe allergic reaction, other serious injury, or death. 5. What if there is a serious problem? An allergic reaction could occur after the vaccinated person leaves the clinic. If you see signs of a severe allergic reaction (hives, swelling of the face and throat, difficulty breathing, a fast heartbeat, dizziness, or weakness), call 9-1-1 and get the person to the nearest hospital. For other signs that concern you, call your health care provider. Adverse reactions should be reported to the Vaccine Adverse Event Reporting System (VAERS). Your health care provider will usually file this report, or you can do it yourself. Visit the VAERS website at www.vaers.LAgents.no or call 360-864-3210.VAERS is only for reporting reactions, and VAERS staff do not give medical advice. 6. The National Vaccine Injury Compensation Program The [employer] Vaccine Injury Compensation Program (VICP) is a federal program that was created to compensate people who may  have been injured by certain vaccines. Visit the VICP website at SpiritualWord.at or call 306 426 9319 to learn about the program and about filing a claim. There is a time limit to file a claim for compensation. 7. How can I learn more?  Ask your healthcare provider.  Call your local or state health department.  Contact the Centers for Disease Control and Prevention (CDC): ? Call 440-279-3728 (1-800-CDC-INFO) or ? Visit CDC's BiotechRoom.com.cy Vaccine Information Statement (Interim) Inactivated Influenza Vaccine (11/05/2017) This information is not intended to replace advice given to you by your health care provider. Make sure you discuss any questions you have with your health care provider. Document Released: 01/02/2006 Document Revised: 06/29/2018 Document Reviewed: 11/09/2017 Elsevier Patient Education  2020 [employer].

## 2018-11-18 NOTE — Assessment & Plan Note (Signed)
Rechecking levels today. Await results.  

## 2018-11-19 LAB — COMPREHENSIVE METABOLIC PANEL
ALT: 10 IU/L (ref 0–32)
AST: 12 IU/L (ref 0–40)
Albumin/Globulin Ratio: 1.8 (ref 1.2–2.2)
Albumin: 4.3 g/dL (ref 3.7–4.7)
Alkaline Phosphatase: 78 IU/L (ref 39–117)
BUN/Creatinine Ratio: 26 (ref 12–28)
BUN: 28 mg/dL — ABNORMAL HIGH (ref 8–27)
Bilirubin Total: 0.3 mg/dL (ref 0.0–1.2)
CO2: 23 mmol/L (ref 20–29)
Calcium: 9.2 mg/dL (ref 8.7–10.3)
Chloride: 101 mmol/L (ref 96–106)
Creatinine, Ser: 1.09 mg/dL — ABNORMAL HIGH (ref 0.57–1.00)
GFR calc Af Amer: 59 mL/min/{1.73_m2} — ABNORMAL LOW (ref >59)
GFR calc non Af Amer: 51 mL/min/{1.73_m2} — ABNORMAL LOW (ref >59)
Globulin, Total: 2.4 g/dL (ref 1.5–4.5)
Glucose: 89 mg/dL (ref 65–99)
Potassium: 4.4 mmol/L (ref 3.5–5.2)
Sodium: 139 mmol/L (ref 134–144)
Total Protein: 6.7 g/dL (ref 6.0–8.5)

## 2018-11-19 LAB — LIPID PANEL W/O CHOL/HDL RATIO
Cholesterol, Total: 129 mg/dL (ref 100–199)
HDL: 51 mg/dL (ref >39)
LDL Calculated: 50 mg/dL (ref 0–99)
Triglycerides: 139 mg/dL (ref 0–149)
VLDL Cholesterol Cal: 28 mg/dL (ref 5–40)

## 2018-11-19 LAB — CBC WITH DIFFERENTIAL/PLATELET
Basophils Absolute: 0 10*3/uL (ref 0.0–0.2)
Basos: 0 %
EOS (ABSOLUTE): 0.1 10*3/uL (ref 0.0–0.4)
Eos: 2 %
Hematocrit: 30.3 % — ABNORMAL LOW (ref 34.0–46.6)
Hemoglobin: 10.3 g/dL — ABNORMAL LOW (ref 11.1–15.9)
Immature Grans (Abs): 0 10*3/uL (ref 0.0–0.1)
Immature Granulocytes: 0 %
Lymphocytes Absolute: 2.3 10*3/uL (ref 0.7–3.1)
Lymphs: 31 %
MCH: 29.1 pg (ref 26.6–33.0)
MCHC: 34 g/dL (ref 31.5–35.7)
MCV: 86 fL (ref 79–97)
Monocytes Absolute: 0.6 10*3/uL (ref 0.1–0.9)
Monocytes: 8 %
Neutrophils Absolute: 4.4 10*3/uL (ref 1.4–7.0)
Neutrophils: 59 %
Platelets: 229 10*3/uL (ref 150–450)
RBC: 3.54 x10E6/uL — ABNORMAL LOW (ref 3.77–5.28)
RDW: 13.6 % (ref 11.7–15.4)
WBC: 7.5 10*3/uL (ref 3.4–10.8)

## 2018-11-19 LAB — TSH: TSH: 1.93 u[IU]/mL (ref 0.450–4.500)

## 2018-11-22 ENCOUNTER — Other Ambulatory Visit: Payer: Self-pay | Admitting: Family Medicine

## 2018-11-22 MED ORDER — FERROUS SULFATE 325 (65 FE) MG PO TABS: 325.0000 mg | ORAL_TABLET | Freq: Two times a day (BID) | ORAL | 3 refills | Status: DC

## 2018-12-21 ENCOUNTER — Ambulatory Visit: Payer: Self-pay | Admitting: Pharmacist

## 2018-12-21 ENCOUNTER — Telehealth: Payer: Self-pay

## 2018-12-21 NOTE — Chronic Care Management (AMB) (Signed)
  Chronic Care Management   Note  12/21/2018 Name: RAYELYNN LOYAL MRN: [id number] DOB: Apr 07, 1945  KEENYA MATERA is a 73 y.o. year old female who is a primary care patient of Valerie Roys, DO. The CCM team was consulted for assistance with chronic disease management and care coordination needs.    Attempted to contact patient to discuss CCM team and medication access needs. Left HIPAA compliant message for patient to return my call at her convenience.   Follow up plan: - If I do not hear back, will outreach again in the next 4-5 weeks  Catie Darnelle Maffucci, PharmD Clinical Pharmacist [place] [phone]

## 2018-12-23 ENCOUNTER — Ambulatory Visit: Payer: Medicare Other | Admitting: Family Medicine

## 2018-12-28 ENCOUNTER — Ambulatory Visit (INDEPENDENT_AMBULATORY_CARE_PROVIDER_SITE_OTHER): Payer: Medicare Other | Admitting: Pharmacist

## 2018-12-28 DIAGNOSIS — E1122 Type 2 diabetes mellitus with diabetic chronic kidney disease: Secondary | ICD-10-CM | POA: Diagnosis not present

## 2018-12-28 DIAGNOSIS — N182 Chronic kidney disease, stage 2 (mild): Secondary | ICD-10-CM | POA: Diagnosis not present

## 2018-12-28 NOTE — Chronic Care Management (AMB) (Signed)
Chronic Care Management   Note  12/28/2018 Name: Alison Hernandez MRN: [id number] DOB: Feb 03, 1946   Subjective:  RANDY WHITENER is a 73 y.o. year old female who is a primary care patient of Valerie Roys, DO. The CCM team was consulted for assistance with chronic disease management and care coordination needs.    Contacted patient for medication management review today.    Ms. Dambrosio was given information about Chronic Care Management services today including:  1. CCM service includes personalized support from designated clinical staff supervised by her physician, including individualized plan of care and coordination with other care providers 2. 24/7 contact phone numbers for assistance for urgent and routine care needs. 3. Service will only be billed when office clinical staff spend 20 minutes or more in a month to coordinate care. 4. Only one practitioner may furnish and bill the service in a calendar month. 5. The patient may stop CCM services at any time (effective at the end of the month) by phone call to the office staff. 6. The patient will be responsible for cost sharing (co-pay) of up to 20% of the service fee (after annual deductible is met).  Patient agreed to services and verbal consent obtained.   Review of patient status, including review of consultants reports, laboratory and other test data, was performed as part of comprehensive evaluation and provision of chronic care management services.   Objective:  Lab Results  Component Value Date   CREATININE 1.09 (H) 11/18/2018   CREATININE 1.22 (H) 04/07/2018   CREATININE 1.21 (H) 11/18/2017    Lab Results  Component Value Date   HGBA1C 7.1 (H) 11/18/2018       Component Value Date/Time   CHOL 129 11/18/2018 1319   CHOL 228 (H) 10/25/2015 1052   TRIG 139 11/18/2018 1319   TRIG 162 (H) 10/25/2015 1052   HDL 51 11/18/2018 1319   VLDL 32 (H) 10/25/2015 1052   LDLCALC 50 11/18/2018 1319    Clinical ASCVD:  No   BP Readings from Last 3 Encounters:  11/18/18 (!) 167/84  04/07/18 (!) 151/77  11/18/17 135/80    Allergies  Allergen Reactions  . Lovastatin Nausea And Vomiting  . Bydureon [Exenatide] Rash    Medications Reviewed Today    Reviewed by De Hollingshead, [facility] (Pharmacist) on 12/28/18 at 1524  Med List Status: <None>  Medication Order Taking? Sig Documenting Provider Last Dose Status Informant  Blood Glucose Monitoring Suppl (ONE TOUCH ULTRA 2) w/Device KIT [id number] Yes Test 3x a day Johnson, Megan P, DO Taking Active   buPROPion (WELLBUTRIN XL) 300 MG 24 hr tablet [id number] Yes Take 1 tablet (300 mg total) by mouth daily. Park Liter P, DO Taking Active   diclofenac sodium (VOLTAREN) 1 % GEL [id number] Yes Apply 4 g topically 4 (four) times daily. Johnson, Megan P, DO Taking Active   ferrous sulfate (FERROUSUL) 325 (65 FE) MG tablet [id number] Yes Take 1 tablet (325 mg total) by mouth 2 (two) times daily with a meal. Wynetta Emery, Megan P, DO Taking Active   glipiZIDE (GLUCOTROL) 5 MG tablet [id number] Yes TAKE 1 TABLET BY MOUTH ONCE DAILY BEFORE BREAKFAST Johnson, Megan P, DO Taking Active   lisinopril-hydrochlorothiazide (ZESTORETIC) 20-25 MG tablet [id number] Yes Take 2 tablets by mouth daily. Johnson, Megan P, DO Taking Active   meclizine (ANTIVERT) 25 MG tablet [id number] No Take 1 tablet (25 mg total) by mouth 3 (three) times daily as needed for dizziness.  Patient not taking: Reported  on 12/28/2018   Valerie Roys, DO Not Taking Active   metFORMIN (GLUCOPHAGE) 1000 MG tablet [id number] Yes Take 1 tablet (1,000 mg total) by mouth 2 (two) times daily with a meal. Wynetta Emery, Megan P, DO Taking Active   Multiple Vitamin (MULTIVITAMIN) tablet [id number] Yes Take 1 tablet by mouth daily. [provider] Taking Active   nystatin (MYCOSTATIN/NYSTOP) powder [id number] No Apply topically 4 (four) times daily.  Patient not taking: Reported on 12/28/2018   Valerie Roys, DO Not  Taking Active   omeprazole (PRILOSEC) 20 MG capsule [id number] Yes Take 2 capsules (40 mg total) by mouth daily. Wynetta Emery, Megan P, DO Taking Active   ondansetron (ZOFRAN-ODT) 8 MG disintegrating tablet [id number] No Take 1 tablet (8 mg total) by mouth every 8 (eight) hours as needed for nausea or vomiting.  Patient not taking: Reported on 12/28/2018   Valerie Roys, DO Not Taking Active   ONE TOUCH ULTRA TEST test strip [id number] Yes Test 3x a day Valerie Roys, DO Taking Active   [place] [facility] DELICA LANCETS 83J MISC [id number] Yes Test 3x a day Valerie Roys, DO Taking Active   rosuvastatin (CRESTOR) 10 MG tablet [id number] Yes Take 1 tablet (10 mg total) by mouth daily. Johnson, Megan P, DO Taking Active   sitaGLIPtin (JANUVIA) 100 MG tablet [id number] No Take 1 tablet (100 mg total) by mouth daily.  Patient not taking: Reported on 11/18/2018   Valerie Roys, DO Not Taking Active            Assessment:   Goals Addressed            This Visit's Progress     Patient Stated   . "I can't afford my Januvia" (pt-stated)       Current Barriers:  . Diabetes: uncontrolled, most recent A1c 7.1%   . Current antihyperglycemic regimen: metformin 1000 mg BID, glipizide 5 mg QAM . Reports that she has not been able to take Januvia d/t a cost of >$300 o Hx Scr increase w/ Jardiance; eGFR currently appropriate for initiation, could consider re-initiation in the future, as we know have long term data supporting renal benefit o Hx rash w/ Bydureon . Cardiovascular risk reduction: o Current hypertensive regimen: lisinopril 20/25 mg; 2 tablets daily; BP not at goal at last visit, though she noted anxiety to provider  o Current hyperlipidemia regimen: rosuvastatin 10 mg daily; LDL at goal <70  Pharmacist Clinical Goal(s):  Marland Kitchen Over the next 90 days, patient will work with PharmD and primary care provider to address optimized medication management and medication access concerns  Interventions: .  Comprehensive medication review performed, medication list updated in electronic medical record . Reviewed hx intolerance to medications. Could possibly consider re-trial of SGLT2 agent in the future, as this would likely allow for d/c of glipizide; however, will plan to pursue Januvia assistance at this time, as no complaints of hypoglycemia w/ glipizide therapy.  . Reviewed income requirement; patient meets income for [employer] patient assistance. Will collaborate with patient and provider to complete the applications.   Patient Self Care Activities:  . Patient will check blood glucose daily , document, and provide at future appointments . Patient will take medications as prescribed . Patient will report any questions or concerns to provider   Initial goal documentation        Plan: - Once patient and provider portions of application collected, will mail to [place], PharmD Clinical Pharmacist [place]  [facility] [place] [phone]

## 2018-12-28 NOTE — Patient Instructions (Signed)
Visit Information  Goals Addressed            This Visit's Progress     Patient Stated   . "I can't afford my Januvia" (pt-stated)       Current Barriers:  . Diabetes: uncontrolled, most recent A1c 7.1%   . Current antihyperglycemic regimen: metformin 1000 mg BID, glipizide 5 mg QAM . Reports that she has not been able to take Januvia d/t a cost of >$300 o Hx Scr increase w/ Jardiance; eGFR currently appropriate for initiation, could consider re-initiation in the future, as we know have long term data supporting renal benefit o Hx rash w/ Bydureon . Cardiovascular risk reduction: o Current hypertensive regimen: lisinopril 20/25 mg; 2 tablets daily; BP not at goal at last visit, though she noted anxiety to provider  o Current hyperlipidemia regimen: rosuvastatin 10 mg daily; LDL at goal <70  Pharmacist Clinical Goal(s):  Marland Kitchen Over the next 90 days, patient will work with PharmD and primary care provider to address optimized medication management and medication access concerns  Interventions: . Comprehensive medication review performed, medication list updated in electronic medical record . Reviewed hx intolerance to medications. Could possibly consider re-trial of SGLT2 agent in the future, as this would likely allow for d/c of glipizide; however, will plan to pursue Januvia assistance at this time, as no complaints of hypoglycemia w/ glipizide therapy.  . Reviewed income requirement; patient meets income for [employer] patient assistance. Will collaborate with patient and provider to complete the applications.   Patient Self Care Activities:  . Patient will check blood glucose daily , document, and provide at future appointments . Patient will take medications as prescribed . Patient will report any questions or concerns to provider   Initial goal documentation        The patient verbalized understanding of instructions provided today and declined a print copy of patient instruction  materials.   Plan: - Once patient and provider portions of application collected, will mail to [place], PharmD Clinical Pharmacist [place] [phone]

## 2018-12-30 ENCOUNTER — Ambulatory Visit: Payer: Self-pay | Admitting: Family Medicine

## 2018-12-31 ENCOUNTER — Other Ambulatory Visit: Payer: Self-pay

## 2018-12-31 ENCOUNTER — Encounter: Payer: Self-pay | Admitting: Family Medicine

## 2018-12-31 ENCOUNTER — Ambulatory Visit (INDEPENDENT_AMBULATORY_CARE_PROVIDER_SITE_OTHER): Payer: Medicare Other | Admitting: Family Medicine

## 2018-12-31 VITALS — BP 137/76 | HR 76 | Temp 99.3°F | Ht 65.0 in | Wt 195.0 lb

## 2018-12-31 DIAGNOSIS — Z1239 Encounter for other screening for malignant neoplasm of breast: Secondary | ICD-10-CM | POA: Diagnosis not present

## 2018-12-31 DIAGNOSIS — I129 Hypertensive chronic kidney disease with stage 1 through stage 4 chronic kidney disease, or unspecified chronic kidney disease: Secondary | ICD-10-CM | POA: Diagnosis not present

## 2018-12-31 NOTE — Assessment & Plan Note (Signed)
Under good control on current regimen. Continue current regimen. Continue to monitor. Call with any concerns. Continue to monitor at home and call if running high.

## 2018-12-31 NOTE — Progress Notes (Signed)
BP 137/76   Pulse 76   Temp 99.3 F (37.4 C) (Oral)   Ht 5\' 5"  (1.651 m)   Wt 195 lb (88.5 kg)   SpO2 96%   BMI 32.45 kg/m    Subjective:    Patient ID: Alison Hernandez, female    DOB: 05-22-1945, 73 y.o.   MRN: [id number]  HPI: Alison Hernandez is a 73 y.o. female  Chief Complaint  Patient presents with  . Hypertension   HYPERTENSION Hypertension status: controlled  Satisfied with current treatment? yes Duration of hypertension: chronic BP monitoring frequency:  a few times a day BP range: 120s/80s BP medication side effects:  no Medication compliance: excellent compliance Previous BP meds: lisinopril, HCTZ Aspirin: no Recurrent headaches: no Visual changes: no Palpitations: no Dyspnea: no Chest pain: no Lower extremity edema: no Dizzy/lightheaded: no   Continues with a lot of stress. Dealing with it OK.  Relevant past medical, surgical, family and social history reviewed and updated as indicated. Interim medical history since our last visit reviewed. Allergies and medications reviewed and updated.  Review of Systems  Constitutional: Negative.   Respiratory: Negative.   Cardiovascular: Negative.   Musculoskeletal: Negative.   Neurological: Negative.   Psychiatric/Behavioral: Negative.     Per HPI unless specifically indicated above     Objective:    BP 137/76   Pulse 76   Temp 99.3 F (37.4 C) (Oral)   Ht 5\' 5"  (1.651 m)   Wt 195 lb (88.5 kg)   SpO2 96%   BMI 32.45 kg/m   Wt Readings from Last 3 Encounters:  12/31/18 195 lb (88.5 kg)  11/18/18 193 lb 4 oz (87.7 kg)  04/07/18 199 lb 3 oz (90.4 kg)    Physical Exam Vitals signs and nursing note reviewed.  Constitutional:      General: She is not in acute distress.    Appearance: Normal appearance. She is not ill-appearing, toxic-appearing or diaphoretic.  HENT:     Head: Normocephalic and atraumatic.     Right Ear: External ear normal.     Left Ear: External ear normal.     Nose: Nose  normal.     Mouth/Throat:     Mouth: Mucous membranes are moist.     Pharynx: Oropharynx is clear.  Eyes:     General: No scleral icterus.       Right eye: No discharge.        Left eye: No discharge.     Extraocular Movements: Extraocular movements intact.     Conjunctiva/sclera: Conjunctivae normal.     Pupils: Pupils are equal, round, and reactive to light.  Neck:     Musculoskeletal: Normal range of motion and neck supple.  Cardiovascular:     Rate and Rhythm: Normal rate and regular rhythm.     Pulses: Normal pulses.     Heart sounds: Normal heart sounds. No murmur. No friction rub. No gallop.   Pulmonary:     Effort: Pulmonary effort is normal. No respiratory distress.     Breath sounds: Normal breath sounds. No stridor. No wheezing, rhonchi or rales.  Chest:     Chest wall: No tenderness.  Musculoskeletal: Normal range of motion.  Skin:    General: Skin is warm and dry.     Capillary Refill: Capillary refill takes less than 2 seconds.     Coloration: Skin is not jaundiced or pale.     Findings: No bruising, erythema, lesion or rash.  Neurological:  General: No focal deficit present.     Mental Status: She is alert and oriented to person, place, and time. Mental status is at baseline.  Psychiatric:        Mood and Affect: Mood normal.        Behavior: Behavior normal.        Thought Content: Thought content normal.        Judgment: Judgment normal.     Results for orders placed or performed in visit on 11/18/18  Microscopic Examination   BLD  Result Value Ref Range   WBC, UA None seen 0 - 5 /hpf   RBC None seen 0 - 2 /hpf   Epithelial Cells (non renal) 0-10 0 - 10 /hpf   Bacteria, UA None seen None seen/Few  Bayer DCA Hb A1c Waived  Result Value Ref Range   HB A1C (BAYER DCA - WAIVED) 7.1 (H) <7.0 %  CBC with Differential/Platelet  Result Value Ref Range   WBC 7.5 3.4 - 10.8 x10E3/uL   RBC 3.54 (L) 3.77 - 5.28 x10E6/uL   Hemoglobin 10.3 (L) 11.1 - 15.9  g/dL   Hematocrit 16.130.3 (L) 09.634.0 - 46.6 %   MCV 86 79 - 97 fL   MCH 29.1 26.6 - 33.0 pg   MCHC 34.0 31.5 - 35.7 g/dL   RDW 04.513.6 40.911.7 - 81.115.4 %   Platelets 229 150 - 450 x10E3/uL   Neutrophils 59 Not Estab. %   Lymphs 31 Not Estab. %   Monocytes 8 Not Estab. %   Eos 2 Not Estab. %   Basos 0 Not Estab. %   Neutrophils Absolute 4.4 1.4 - 7.0 x10E3/uL   Lymphocytes Absolute 2.3 0.7 - 3.1 x10E3/uL   Monocytes Absolute 0.6 0.1 - 0.9 x10E3/uL   EOS (ABSOLUTE) 0.1 0.0 - 0.4 x10E3/uL   Basophils Absolute 0.0 0.0 - 0.2 x10E3/uL   Immature Granulocytes 0 Not Estab. %   Immature Grans (Abs) 0.0 0.0 - 0.1 x10E3/uL  Comprehensive metabolic panel  Result Value Ref Range   Glucose 89 65 - 99 mg/dL   BUN 28 (H) 8 - 27 mg/dL   Creatinine, Ser 9.141.09 (H) 0.57 - 1.00 mg/dL   GFR calc non Af Amer 51 (L) >59 mL/min/1.73   GFR calc Af Amer 59 (L) >59 mL/min/1.73   BUN/Creatinine Ratio 26 12 - 28   Sodium 139 134 - 144 mmol/L   Potassium 4.4 3.5 - 5.2 mmol/L   Chloride 101 96 - 106 mmol/L   CO2 23 20 - 29 mmol/L   Calcium 9.2 8.7 - 10.3 mg/dL   Total Protein 6.7 6.0 - 8.5 g/dL   Albumin 4.3 3.7 - 4.7 g/dL   Globulin, Total 2.4 1.5 - 4.5 g/dL   Albumin/Globulin Ratio 1.8 1.2 - 2.2   Bilirubin Total 0.3 0.0 - 1.2 mg/dL   Alkaline Phosphatase 78 39 - 117 IU/L   AST 12 0 - 40 IU/L   ALT 10 0 - 32 IU/L  Lipid Panel w/o Chol/HDL Ratio  Result Value Ref Range   Cholesterol, Total 129 100 - 199 mg/dL   Triglycerides 782139 0 - 149 mg/dL   HDL 51 >95>39 mg/dL   VLDL Cholesterol Cal 28 5 - 40 mg/dL   LDL Calculated 50 0 - 99 mg/dL  Microalbumin, Urine Waived  Result Value Ref Range   Microalb, Ur Waived 80 (H) 0 - 19 mg/L   Creatinine, Urine Waived 200 10 - 300 mg/dL   Microalb/Creat  Ratio 30-300 (H) <30 mg/g  TSH  Result Value Ref Range   TSH 1.930 0.450 - 4.500 uIU/mL  UA/M w/rflx Culture, Routine   Specimen: Blood   BLD  Result Value Ref Range   Specific Gravity, UA 1.020 1.005 - 1.030   pH, UA  5.0 5.0 - 7.5   Color, UA Yellow Yellow   Appearance Ur Clear Clear   Leukocytes,UA Negative Negative   Protein,UA Trace (A) Negative/Trace   Glucose, UA Negative Negative   Ketones, UA Trace (A) Negative   RBC, UA Negative Negative   Bilirubin, UA Negative Negative   Urobilinogen, Ur 0.2 0.2 - 1.0 mg/dL   Nitrite, UA Negative Negative   Microscopic Examination See below:       Assessment & Plan:   Problem List Items Addressed This Visit      Genitourinary   Benign hypertensive renal disease - Primary    Under good control on current regimen. Continue current regimen. Continue to monitor. Call with any concerns. Continue to monitor at home and call if running high.        Other Visit Diagnoses    Encounter for screening for malignant neoplasm of breast, unspecified screening modality       Mammogram ordered today.   Relevant Orders   MM DIGITAL SCREENING BILATERAL       Follow up plan: Return in about 2 months (around 03/02/2019).

## 2019-01-20 ENCOUNTER — Other Ambulatory Visit: Payer: Self-pay | Admitting: Pharmacy Technician

## 2019-01-20 NOTE — Patient Outreach (Signed)
[place] [facility]) Care Management  01/20/2019  NEA GITTENS August 27, 1945 [id number]  Care coordination call placed to [employer] in regards to patient's application for Januvia.  Spoke to Angelica who informed they have received the application and they mailed the patient the attestation form on 01/13/2019.  Will followup with patient to inquire if she has received the attestation form.  Marcos Peloso P. Emnet Monk, [place] Management [phone]

## 2019-01-20 NOTE — Patient Outreach (Signed)
[place] [facility]) Care Management  01/20/2019  Alison Hernandez 03/22/1946 [id number]  ADDENDUM  Unsuccessful outreach call placed to patient in regards to [employer] application for [place].  Unfortunately patient did not answer the phone, HIPAA compliant voicemail left.  Was calling patient to inform her to be expecting the attestation form.  Will attempt another call in 5-7 business days if call is not returned.  [name] P. Jaqueline Uber, [place] Management [phone]

## 2019-01-21 ENCOUNTER — Telehealth: Payer: Self-pay

## 2019-01-21 ENCOUNTER — Other Ambulatory Visit: Payer: Self-pay | Admitting: Pharmacy Technician

## 2019-01-21 NOTE — Patient Outreach (Signed)
[place] [facility]) Care Management  01/21/2019  Alison Hernandez Jan 06, 1946 [id number]   Incoming call received from patient in regards to [employer] application for [employer].  Spoke to pateint, HIPAA identifiers verified.  Informed patient to be expecting a letter from [employer] patient assistance program. Informed patient to call me when she receives the attestation letter/form and then we could discuss it together. Patient verbalized understanding.  Will followup with patient in 5-10 business days if call is not returned.  Alison Hernandez, [place] Management [phone]

## 2019-01-28 ENCOUNTER — Other Ambulatory Visit: Payer: Self-pay | Admitting: Pharmacy Technician

## 2019-01-28 NOTE — Patient Outreach (Signed)
[place] [facility]) Care Management  01/28/2019  Alison Hernandez 1945/09/11 [id number]    Incoming call received from patient in regards to [employer] application for [employer].  Spoke to patient, HIPAA identifiers verified.  Patient informed she received her attestation form. Discussed the form with the patient and she was able to fill out the form while on the line. Informed patient to put the form along with the original application in to the preaddressed stamped envelope and mail it back to [employer]. Patient informed she would place in mail on 01/29/2019.  Informed patient I would followup with [employer] in 7-10 business days to confirm receipt of attestation form and inquire about a timeline for medication delivery.  Alison Hernandez, [place] Management [phone]

## 2019-02-09 ENCOUNTER — Other Ambulatory Visit: Payer: Self-pay | Admitting: Pharmacy Technician

## 2019-02-09 NOTE — Patient Outreach (Signed)
[place] [facility]) Care Management  02/09/2019  Alison Hernandez 12-31-1945 [id number]  Care coordination call placed to [employer] in regards to patient's application for Januvia.  Spoke to [place] who informed they have not received back the patient's attestation form that she mailed to them on 11/7. Margie informed that it can take up to 14 business days for them to receive it and another 3 business days for it to post in their system. Margie suggested trying back next week.  Will followup with [employer] in 5-7 business days.  Ladarion Munyon P. Rollan Roger, [place] Management [phone]

## 2019-02-14 ENCOUNTER — Ambulatory Visit (INDEPENDENT_AMBULATORY_CARE_PROVIDER_SITE_OTHER): Payer: Medicare Other | Admitting: Family Medicine

## 2019-02-14 ENCOUNTER — Encounter: Payer: Self-pay | Admitting: Family Medicine

## 2019-02-14 ENCOUNTER — Other Ambulatory Visit: Payer: Self-pay

## 2019-02-14 ENCOUNTER — Other Ambulatory Visit: Payer: Self-pay | Admitting: Pharmacy Technician

## 2019-02-14 VITALS — BP 120/60 | HR 88 | Temp 91.7°F

## 2019-02-14 DIAGNOSIS — N182 Chronic kidney disease, stage 2 (mild): Secondary | ICD-10-CM | POA: Diagnosis not present

## 2019-02-14 DIAGNOSIS — E1122 Type 2 diabetes mellitus with diabetic chronic kidney disease: Secondary | ICD-10-CM

## 2019-02-14 NOTE — Assessment & Plan Note (Signed)
Will get her in for A1c. Has not been able to get [place]- filled out the paperwork and then never heard anything. Await results and treat as needed. Call with any concerns.

## 2019-02-14 NOTE — Progress Notes (Signed)
BP 120/60   Pulse 88   Temp (!) 91.7 F (33.2 C)    Subjective:    Patient ID: Alison Hernandez, female    DOB: [birth year], 73 y.o.   MRN: [id number]  HPI: Alison Hernandez is a 73 y.o. female  Chief Complaint  Patient presents with  . Diabetes   DIABETES Hypoglycemic episodes:yes- down to 86 1x Polydipsia/polyuria: yes Visual disturbance: no Chest pain: no Paresthesias: no Glucose Monitoring: yes  Accucheck frequency: Daily  Fasting glucose: 130 Taking Insulin?: no Blood Pressure Monitoring: not checking Retinal Examination: Not up to Date Foot Exam: Up to Date Diabetic Education: Completed Pneumovax: Up to Date Influenza: Up to Date Aspirin: yes  Relevant past medical, surgical, family and social history reviewed and updated as indicated. Interim medical history since our last visit reviewed. Allergies and medications reviewed and updated.  Review of Systems  Constitutional: Negative.   Respiratory: Negative.   Cardiovascular: Negative.   Musculoskeletal: Negative.   Neurological: Negative.   Psychiatric/Behavioral: Negative.     Per HPI unless specifically indicated above     Objective:    BP 120/60   Pulse 88   Temp (!) 91.7 F (33.2 C)   Wt Readings from Last 3 Encounters:  12/31/18 195 lb (88.5 kg)  11/18/18 193 lb 4 oz (87.7 kg)  04/07/18 199 lb 3 oz (90.4 kg)    Physical Exam Vitals signs and nursing note reviewed.  Constitutional:      General: She is not in acute distress.    Appearance: Normal appearance. She is not ill-appearing, toxic-appearing or diaphoretic.  HENT:     Head: Normocephalic and atraumatic.     Right Ear: External ear normal.     Left Ear: External ear normal.     Nose: Nose normal.     Mouth/Throat:     Mouth: Mucous membranes are moist.     Pharynx: Oropharynx is clear.  Eyes:     General: No scleral icterus.       Right eye: No discharge.        Left eye: No discharge.     Conjunctiva/sclera: Conjunctivae  normal.     Pupils: Pupils are equal, round, and reactive to light.  Neck:     Musculoskeletal: Normal range of motion.  Pulmonary:     Effort: Pulmonary effort is normal. No respiratory distress.     Comments: Speaking in full sentences Musculoskeletal: Normal range of motion.  Skin:    Coloration: Skin is not jaundiced or pale.     Findings: No bruising, erythema, lesion or rash.  Neurological:     Mental Status: She is alert and oriented to person, place, and time. Mental status is at baseline.  Psychiatric:        Mood and Affect: Mood normal.        Behavior: Behavior normal.        Thought Content: Thought content normal.        Judgment: Judgment normal.     Results for orders placed or performed in visit on 11/18/18  Microscopic Examination   BLD  Result Value Ref Range   WBC, UA None seen 0 - 5 /hpf   RBC None seen 0 - 2 /hpf   Epithelial Cells (non renal) 0-10 0 - 10 /hpf   Bacteria, UA None seen None seen/Few  Bayer DCA Hb A1c Waived  Result Value Ref Range   HB A1C (BAYER DCA - WAIVED) 7.1 (H) <  7.0 %  CBC with Differential/Platelet  Result Value Ref Range   WBC 7.5 3.4 - 10.8 x10E3/uL   RBC 3.54 (L) 3.77 - 5.28 x10E6/uL   Hemoglobin 10.3 (L) 11.1 - 15.9 g/dL   Hematocrit 16.130.3 (L) 09.634.0 - 46.6 %   MCV 86 79 - 97 fL   MCH 29.1 26.6 - 33.0 pg   MCHC 34.0 31.5 - 35.7 g/dL   RDW 04.513.6 40.911.7 - 81.115.4 %   Platelets 229 150 - 450 x10E3/uL   Neutrophils 59 Not Estab. %   Lymphs 31 Not Estab. %   Monocytes 8 Not Estab. %   Eos 2 Not Estab. %   Basos 0 Not Estab. %   Neutrophils Absolute 4.4 1.4 - 7.0 x10E3/uL   Lymphocytes Absolute 2.3 0.7 - 3.1 x10E3/uL   Monocytes Absolute 0.6 0.1 - 0.9 x10E3/uL   EOS (ABSOLUTE) 0.1 0.0 - 0.4 x10E3/uL   Basophils Absolute 0.0 0.0 - 0.2 x10E3/uL   Immature Granulocytes 0 Not Estab. %   Immature Grans (Abs) 0.0 0.0 - 0.1 x10E3/uL  Comprehensive metabolic panel  Result Value Ref Range   Glucose 89 65 - 99 mg/dL   BUN 28 (H) 8 -  27 mg/dL   Creatinine, Ser 9.141.09 (H) 0.57 - 1.00 mg/dL   GFR calc non Af Amer 51 (L) >59 mL/min/1.73   GFR calc Af Amer 59 (L) >59 mL/min/1.73   BUN/Creatinine Ratio 26 12 - 28   Sodium 139 134 - 144 mmol/L   Potassium 4.4 3.5 - 5.2 mmol/L   Chloride 101 96 - 106 mmol/L   CO2 23 20 - 29 mmol/L   Calcium 9.2 8.7 - 10.3 mg/dL   Total Protein 6.7 6.0 - 8.5 g/dL   Albumin 4.3 3.7 - 4.7 g/dL   Globulin, Total 2.4 1.5 - 4.5 g/dL   Albumin/Globulin Ratio 1.8 1.2 - 2.2   Bilirubin Total 0.3 0.0 - 1.2 mg/dL   Alkaline Phosphatase 78 39 - 117 IU/L   AST 12 0 - 40 IU/L   ALT 10 0 - 32 IU/L  Lipid Panel w/o Chol/HDL Ratio  Result Value Ref Range   Cholesterol, Total 129 100 - 199 mg/dL   Triglycerides 782139 0 - 149 mg/dL   HDL 51 >95>39 mg/dL   VLDL Cholesterol Cal 28 5 - 40 mg/dL   LDL Calculated 50 0 - 99 mg/dL  Microalbumin, Urine Waived  Result Value Ref Range   Microalb, Ur Waived 80 (H) 0 - 19 mg/L   Creatinine, Urine Waived 200 10 - 300 mg/dL   Microalb/Creat Ratio 30-300 (H) <30 mg/g  TSH  Result Value Ref Range   TSH 1.930 0.450 - 4.500 uIU/mL  UA/M w/rflx Culture, Routine   Specimen: Blood   BLD  Result Value Ref Range   Specific Gravity, UA 1.020 1.005 - 1.030   pH, UA 5.0 5.0 - 7.5   Color, UA Yellow Yellow   Appearance Ur Clear Clear   Leukocytes,UA Negative Negative   Protein,UA Trace (A) Negative/Trace   Glucose, UA Negative Negative   Ketones, UA Trace (A) Negative   RBC, UA Negative Negative   Bilirubin, UA Negative Negative   Urobilinogen, Ur 0.2 0.2 - 1.0 mg/dL   Nitrite, UA Negative Negative   Microscopic Examination See below:       Assessment & Plan:   Problem List Items Addressed This Visit      Endocrine   Diabetes (HCC) - Primary    Will  get her in for A1c. Has not been able to get [place]- filled out the paperwork and then never heard anything. Await results and treat as needed. Call with any concerns.       Relevant Orders   Bayer DCA Hb A1c  Waived       Follow up plan: Return in about 3 months (around 05/17/2019).   . This visit was completed via Doximity due to the restrictions of the COVID-19 pandemic. All issues as above were discussed and addressed. Physical exam was done as above through visual confirmation on Doximity. If it was felt that the patient should be evaluated in the office, they were directed there. The patient verbally consented to this visit. . Location of the patient: home . Location of the provider: work . Those involved with this call:  . Provider: Park Liter, DO . CMA: Tiffany Reel, CMA . Front Desk/Registration: Don Perking  . Time spent on call: 15 minutes with patient face to face via video conference. More than 50% of this time was spent in counseling and coordination of care. 23 minutes total spent in review of patient's record and preparation of their chart.

## 2019-02-14 NOTE — Patient Outreach (Signed)
[place] [facility]) Care Management  02/14/2019  ANAALICIA REIMANN 1945-12-16 [id number]    Care coordination call placed to [employer] in regards to patient's application for Januvia.  Spoke to [place] who informed they have not received back the patient's attestation form. Informed Mimi it was mailed to them on 01/12/201. Mimi suggested giving it a few more days and to try back next week after the Thanksgiving holiday to see if it has been received and/or processed.  Will followup with [employer] in 5-7 business days.  Derril Franek P. Madilyn Cephas, [place] Management [phone]

## 2019-02-21 ENCOUNTER — Other Ambulatory Visit: Payer: Self-pay | Admitting: Pharmacy Technician

## 2019-02-21 NOTE — Patient Outreach (Signed)
[place] [facility]) Care Management  02/21/2019  Alison Hernandez 06-30-1945 [id number]   Care coordination call placed to [employer] in regards to patient's application for Januvia.  Spoke to Alison Hernandez who informed they have not received the attestation form. Alison Hernandez informed to try back Friday and if not received then other arrangements will be made at that time.  Will follow up with [employer] in 3-5 business days.  Alison Hernandez, [place] Management [phone]

## 2019-02-23 ENCOUNTER — Ambulatory Visit: Payer: Self-pay | Admitting: Pharmacist

## 2019-02-23 ENCOUNTER — Telehealth: Payer: Self-pay

## 2019-02-23 NOTE — Chronic Care Management (AMB) (Signed)
  Chronic Care Management   Note  02/23/2019 Name: LEAHA CUERVO MRN: [id number] DOB: 21-Dec-1945  SHARINE CADLE is a 73 y.o. year old female who is a primary care patient of Valerie Roys, DO. The CCM team was consulted for assistance with chronic disease management and care coordination needs.    Attempted to contact patient to follow up on medication management needs. We are still waiting for [employer] to receive patient's signed attestation form. Jill Simcox, CPhT continues to plan to f/u with the company.   Left message for patient to return my call at her convenience.   Follow up plan: - If I do not hear back, will outreach again in the next 1-3 weeks  Catie Darnelle Maffucci, PharmD, [place] [phone]

## 2019-02-24 ENCOUNTER — Other Ambulatory Visit: Payer: Self-pay

## 2019-02-24 MED ORDER — FERROUS SULFATE 325 (65 FE) MG PO TABS: 325.0000 mg | ORAL_TABLET | Freq: Two times a day (BID) | ORAL | 3 refills | Status: DC

## 2019-02-24 NOTE — Telephone Encounter (Signed)
[facility] faxed a Rx refill request on ferrous sulf 325 mg tab

## 2019-02-25 ENCOUNTER — Other Ambulatory Visit: Payer: Self-pay | Admitting: Pharmacy Technician

## 2019-02-25 ENCOUNTER — Ambulatory Visit (INDEPENDENT_AMBULATORY_CARE_PROVIDER_SITE_OTHER): Payer: Medicare Other | Admitting: Pharmacist

## 2019-02-25 DIAGNOSIS — E1122 Type 2 diabetes mellitus with diabetic chronic kidney disease: Secondary | ICD-10-CM

## 2019-02-25 DIAGNOSIS — N182 Chronic kidney disease, stage 2 (mild): Secondary | ICD-10-CM | POA: Diagnosis not present

## 2019-02-25 NOTE — Patient Instructions (Signed)
Visit Information  Goals Addressed            This Visit's Progress     Patient Stated   . "I can't afford my Januvia" (pt-stated)       Current Barriers:  . Diabetes: uncontrolled, most recent A1c 7.1%, due for A1c, though, has been off Januvia therapy  . Current antihyperglycemic regimen: metformin 1000 mg BID, glipizide 5 mg QAM . Reports that she has not been able to take Januvia d/t a cost of >$300 - collaboratively applied for Januvia patient assistance through [employer]  o Hx Scr increase w/ Jardiance; eGFR currently appropriate for initiation, could consider re-initiation in the future, as we know have long term data supporting renal benefit o Hx rash w/ Bydureon . Denies any issues w/ hypoglycemia . Cardiovascular risk reduction: o Current hypertensive regimen: lisinopril 20/25 mg; 2 tablets daily; BP at goal <130/80 o Current hyperlipidemia regimen: rosuvastatin 10 mg daily; LDL at goal <70  Pharmacist Clinical Goal(s):  Marland Kitchen Over the next 90 days, patient will work with PharmD and primary care provider to address optimized medication management and medication access concerns  Interventions: . Comprehensive medication review performed, medication list updated in electronic medical record . Collaborated w/ Susy Frizzle - patient has been APPROVED for Januvia assistance from [employer] through 03/23/2020. Could take up to 3 business weeks given the slow processing/shipping time w/ [employer]. Informed her that Sharee Pimple Simcox would be calling her to follow up in the next few weeks.  . Denies any medication questions or concerns at this time.  . Per epic refill hisotry, patient is up to date on refills for diabetes, RAAS, and statin agents   Patient Self Care Activities:  . Patient will check blood glucose daily , document, and provide at future appointments . Patient will take medications as prescribed . Patient will report any questions or concerns to provider   Please see past updates related  to this goal by clicking on the "Past Updates" button in the selected goal         The patient verbalized understanding of instructions provided today and declined a print copy of patient instruction materials.    Plan: - Scheduled phone appointment with patient for Tuesday, January 15  Courtney Heys, PharmD, [place] [phone]

## 2019-02-25 NOTE — Chronic Care Management (AMB) (Signed)
Chronic Care Management   Follow Up Note   02/25/2019 Name: KARLINA SUARES MRN: [id number] DOB: 12-31-1945  Referred by: Valerie Roys, DO Reason for referral : Chronic Care Management (Medication Management)   Alison Hernandez is a 73 y.o. year old female who is a primary care patient of Valerie Roys, DO. The CCM team was consulted for assistance with chronic disease management and care coordination needs.    Contacted patient to follow up on medication management needs today.   Review of patient status, including review of consultants reports, relevant laboratory and other test results, and collaboration with appropriate care team members and the patient's provider was performed as part of comprehensive patient evaluation and provision of chronic care management services.    SDOH (Social Determinants of Health) screening performed today: Financial Strain . See Care Plan for related entries.   Outpatient Encounter Medications as of 02/25/2019  Medication Sig  . Blood Glucose Monitoring Suppl (ONE TOUCH ULTRA 2) w/Device KIT Test 3x a day  . buPROPion (WELLBUTRIN XL) 300 MG 24 hr tablet Take 1 tablet (300 mg total) by mouth daily.  . diclofenac sodium (VOLTAREN) 1 % GEL Apply 4 g topically 4 (four) times daily.  . ferrous sulfate (FERROUSUL) 325 (65 FE) MG tablet Take 1 tablet (325 mg total) by mouth 2 (two) times daily with a meal.  . glipiZIDE (GLUCOTROL) 5 MG tablet TAKE 1 TABLET BY MOUTH ONCE DAILY BEFORE BREAKFAST  . lisinopril-hydrochlorothiazide (ZESTORETIC) 20-25 MG tablet Take 2 tablets by mouth daily.  . meclizine (ANTIVERT) 25 MG tablet Take 1 tablet (25 mg total) by mouth 3 (three) times daily as needed for dizziness.  . metFORMIN (GLUCOPHAGE) 1000 MG tablet Take 1 tablet (1,000 mg total) by mouth 2 (two) times daily with a meal.  . Multiple Vitamin (MULTIVITAMIN) tablet Take 1 tablet by mouth daily.  Marland Kitchen nystatin (MYCOSTATIN/NYSTOP) powder Apply topically 4 (four) times  daily.  Marland Kitchen omeprazole (PRILOSEC) 20 MG capsule Take 2 capsules (40 mg total) by mouth daily.  . ondansetron (ZOFRAN-ODT) 8 MG disintegrating tablet Take 1 tablet (8 mg total) by mouth every 8 (eight) hours as needed for nausea or vomiting.  . ONE TOUCH ULTRA TEST test strip Test 3x a day  . ONETOUCH DELICA LANCETS 50P MISC Test 3x a day  . rosuvastatin (CRESTOR) 10 MG tablet Take 1 tablet (10 mg total) by mouth daily.  . sitaGLIPtin (JANUVIA) 100 MG tablet Take 1 tablet (100 mg total) by mouth daily. (Patient not taking: Reported on 11/18/2018)   No facility-administered encounter medications on file as of 02/25/2019.      Goals Addressed            This Visit's Progress     Patient Stated   . "I can't afford my Januvia" (pt-stated)       Current Barriers:  . Diabetes: uncontrolled, most recent A1c 7.1%, due for A1c, though, has been off Januvia therapy  . Current antihyperglycemic regimen: metformin 1000 mg BID, glipizide 5 mg QAM . Reports that she has not been able to take Januvia d/t a cost of >$300 - collaboratively applied for Januvia patient assistance through [employer]  o Hx Scr increase w/ Jardiance; eGFR currently appropriate for initiation, could consider re-initiation in the future, as we know have long term data supporting renal benefit o Hx rash w/ Bydureon . Denies any issues w/ hypoglycemia . Cardiovascular risk reduction: o Current hypertensive regimen: lisinopril 20/25 mg; 2 tablets daily;  BP at goal <130/80 o Current hyperlipidemia regimen: rosuvastatin 10 mg daily; LDL at goal <70  Pharmacist Clinical Goal(s):  Marland Kitchen Over the next 90 days, patient will work with PharmD and primary care provider to address optimized medication management and medication access concerns  Interventions: . Comprehensive medication review performed, medication list updated in electronic medical record . Collaborated w/ Susy Frizzle - patient has been APPROVED for Januvia assistance from [employer]  through 03/23/2020. Could take up to 3 business weeks given the slow processing/shipping time w/ [employer]. Informed her that Sharee Pimple Simcox would be calling her to follow up in the next few weeks.  . Denies any medication questions or concerns at this time.  . Per epic refill hisotry, patient is up to date on refills for diabetes, RAAS, and statin agents   Patient Self Care Activities:  . Patient will check blood glucose daily , document, and provide at future appointments . Patient will take medications as prescribed . Patient will report any questions or concerns to provider   Please see past updates related to this goal by clicking on the "Past Updates" button in the selected goal          Plan: - Scheduled phone appointment with patient for Tuesday, January 15  Catie Darnelle Maffucci, PharmD, [place] [phone]

## 2019-02-25 NOTE — Patient Outreach (Signed)
[facility]) Care Management  02/25/2019  Alison Hernandez 27-Jan-1946 [id number]   Care coordination call placed to [employer] in regards to patient's Januvia application.  Spoke to [place] who informed patient has been APPROVED 02/23/2019-03/23/2020. Patient will be receiving a 90 days supply of medication. [name] informed it takes 3 business days for the pharmacy to receive the request, 7-10 days for them to process the request and an additional 5-7 business days for delivery.  Will followup with patient in 15-20 business days to confirm receipt of medication.  [name] P. [name], [place] Management [phone]

## 2019-03-09 ENCOUNTER — Other Ambulatory Visit: Payer: Self-pay | Admitting: Pharmacy Technician

## 2019-03-09 NOTE — Patient Outreach (Signed)
[facility]) Care Management  03/09/2019  Alison Hernandez [birth year] [id number]  Care coordination call placed to [employer] in regards to delivery status of patient's Januvia.  Spoke to [name] who informed the medication was delivered on 03/01/2019 for 90 days supply. She also confirmed patient is approved thru the end of 2021.  Will followup with patient.  Twisha Vanpelt P. Aayla Marrocco, [place] Management [phone]

## 2019-03-09 NOTE — Patient Outreach (Signed)
[facility]) Care Management  03/09/2019  Alison Hernandez 06-Jun-1945 [id number]   ADDENDUM   Successful call placed to patient regarding patient assistance medication delivery of Januvia with [employer], HIPAA identifiers verified.   Patient informed she has received a 90 days supply of the Januvia. Informed patient she was approved thru all of 2021. Discussed refill procedure with patient and informed her to call [facility] to order her refill. Informed patient where to find that phone number. Informed patient to call them when she has an approximately 2 week supply. Patient verbalized understanding. Confirmed patient had our name and number as she denied have any further questions or concerns.  Follow up:  Will route note to [place] for case closure. Will remove myself from care team as patient assistance has been completed.  Kiannah Grunow P. Remy Voiles, [place] Management [phone]

## 2019-04-12 ENCOUNTER — Telehealth: Payer: Self-pay

## 2019-04-15 ENCOUNTER — Ambulatory Visit (INDEPENDENT_AMBULATORY_CARE_PROVIDER_SITE_OTHER): Payer: Medicare Other | Admitting: Pharmacist

## 2019-04-15 DIAGNOSIS — N182 Chronic kidney disease, stage 2 (mild): Secondary | ICD-10-CM

## 2019-04-15 DIAGNOSIS — I129 Hypertensive chronic kidney disease with stage 1 through stage 4 chronic kidney disease, or unspecified chronic kidney disease: Secondary | ICD-10-CM | POA: Diagnosis not present

## 2019-04-15 DIAGNOSIS — E782 Mixed hyperlipidemia: Secondary | ICD-10-CM

## 2019-04-15 DIAGNOSIS — E1122 Type 2 diabetes mellitus with diabetic chronic kidney disease: Secondary | ICD-10-CM

## 2019-04-15 NOTE — Chronic Care Management (AMB) (Signed)
Chronic Care Management   Follow Up Note   04/15/2019 Name: MYKELTI GOLDENSTEIN MRN: [id number] DOB: [birth year]  Referred by: Valerie Roys, DO Reason for referral : Chronic Care Management   CLAUDENE GATLIFF is a 74 y.o. year old female who is a primary care patient of Valerie Roys, DO. The CCM team was consulted for assistance with chronic disease management and care coordination needs.    Contacted patient for medication management review.   Review of patient status, including review of consultants reports, relevant laboratory and other test results, and collaboration with appropriate care team members and the patient's provider was performed as part of comprehensive patient evaluation and provision of chronic care management services.    SDOH (Social Determinants of Health) screening performed today: Financial Strain . See Care Plan for related entries.   Outpatient Encounter Medications as of 04/15/2019  Medication Sig  . glipiZIDE (GLUCOTROL) 5 MG tablet TAKE 1 TABLET BY MOUTH ONCE DAILY BEFORE BREAKFAST  . metFORMIN (GLUCOPHAGE) 1000 MG tablet Take 1 tablet (1,000 mg total) by mouth 2 (two) times daily with a meal.  . sitaGLIPtin (JANUVIA) 100 MG tablet Take 1 tablet (100 mg total) by mouth daily.  . Blood Glucose Monitoring Suppl (ONE TOUCH ULTRA 2) w/Device KIT Test 3x a day  . buPROPion (WELLBUTRIN XL) 300 MG 24 hr tablet Take 1 tablet (300 mg total) by mouth daily.  . diclofenac sodium (VOLTAREN) 1 % GEL Apply 4 g topically 4 (four) times daily.  . ferrous sulfate (FERROUSUL) 325 (65 FE) MG tablet Take 1 tablet (325 mg total) by mouth 2 (two) times daily with a meal.  . lisinopril-hydrochlorothiazide (ZESTORETIC) 20-25 MG tablet Take 2 tablets by mouth daily.  . meclizine (ANTIVERT) 25 MG tablet Take 1 tablet (25 mg total) by mouth 3 (three) times daily as needed for dizziness.  . Multiple Vitamin (MULTIVITAMIN) tablet Take 1 tablet by mouth daily.  Marland Kitchen nystatin  (MYCOSTATIN/NYSTOP) powder Apply topically 4 (four) times daily.  Marland Kitchen omeprazole (PRILOSEC) 20 MG capsule Take 2 capsules (40 mg total) by mouth daily.  . ondansetron (ZOFRAN-ODT) 8 MG disintegrating tablet Take 1 tablet (8 mg total) by mouth every 8 (eight) hours as needed for nausea or vomiting.  . ONE TOUCH ULTRA TEST test strip Test 3x a day  . ONETOUCH DELICA LANCETS 40J MISC Test 3x a day  . rosuvastatin (CRESTOR) 10 MG tablet Take 1 tablet (10 mg total) by mouth daily.   No facility-administered encounter medications on file as of 04/15/2019.     Objective:   Goals Addressed            This Visit's Progress     Patient Stated   . PharmD "I want to stay healthy (pt-stated)       Current Barriers:  . Diabetes: uncontrolled, most recent A1c 7.1%,  . Current antihyperglycemic regimen: metformin 1000 mg BID, glipizide 5 mg QAM, Januvia 100 mg daily  o Approved for Januvia assistance through [employer] through 03/23/2020  o Hx Scr increase w/ Jardiance; eGFR currently appropriate for initiation, could consider re-initiation in the future, as we know have long term data supporting renal benefit o Hx rash w/ Bydureon . Denies any issues w/ hypoglycemia . Current dietary habits - notes that controlling portion sizes is her biggest issue . Cardiovascular risk reduction: o Current hypertensive regimen: lisinopril 20/25 mg; 2 tablets daily; BP at goal <130/80 o Current hyperlipidemia regimen: rosuvastatin 10 mg daily; LDL at goal <  70 . Depression: bupropion XL 300 mg daily   Pharmacist Clinical Goal(s):  Marland Kitchen Over the next 90 days, patient will work with PharmD and primary care provider to address optimized medication management and medication access concerns  Interventions: . Comprehensive medication review performed, medication list updated in electronic medical record . Reviewed refill procedure for Januvia through [employer] patient assistance . Discussed potential alternative options of SGLT2  and GLP1. Though patient had hx rash w/ Bydureon, I believe appropriate to consider other GLP1 agents, given her note about wanting to better control her appetite. Discussed ASCVD benefits. Likely would be able to replace Januvia + glipizide w/ GLP1 while providing weight loss benefits. Patient would qualify for assistance. Encouraged to discuss w/ Dr. Wynetta Emery at appointment next month.  . Patient up to date on refills for DM, RAAS, and statin medications.  Patient Self Care Activities:  . Patient will check blood glucose daily , document, and provide at future appointments . Patient will take medications as prescribed . Patient will report any questions or concerns to provider   Please see past updates related to this goal by clicking on the "Past Updates" button in the selected goal          Plan:  - Scheduled outreach call 05/24/19   Catie Darnelle Maffucci, PharmD, [place] [phone]

## 2019-04-15 NOTE — Patient Instructions (Signed)
Visit Information  Goals Addressed            This Visit's Progress     Patient Stated   . PharmD "I want to stay healthy (pt-stated)       Current Barriers:  . Diabetes: uncontrolled, most recent A1c 7.1%,  . Current antihyperglycemic regimen: metformin 1000 mg BID, glipizide 5 mg QAM, Januvia 100 mg daily  o Approved for Januvia assistance through [employer] through 03/23/2020  o Hx Scr increase w/ Jardiance; eGFR currently appropriate for initiation, could consider re-initiation in the future, as we know have long term data supporting renal benefit o Hx rash w/ Bydureon . Denies any issues w/ hypoglycemia . Current dietary habits - notes that controlling portion sizes is her biggest issue . Cardiovascular risk reduction: o Current hypertensive regimen: lisinopril 20/25 mg; 2 tablets daily; BP at goal <130/80 o Current hyperlipidemia regimen: rosuvastatin 10 mg daily; LDL at goal <70 . Depression: bupropion XL 300 mg daily   Pharmacist Clinical Goal(s):  Marland Kitchen Over the next 90 days, patient will work with PharmD and primary care provider to address optimized medication management and medication access concerns  Interventions: . Comprehensive medication review performed, medication list updated in electronic medical record . Reviewed refill procedure for Januvia through [employer] patient assistance . Discussed potential alternative options of SGLT2 and GLP1. Though patient had hx rash w/ Bydureon, I believe appropriate to consider other GLP1 agents, given her note about wanting to better control her appetite. Discussed ASCVD benefits. Likely would be able to replace Januvia + glipizide w/ GLP1 while providing weight loss benefits. Patient would qualify for assistance. Encouraged to discuss w/ Dr. Wynetta Emery at appointment next month.  . Patient up to date on refills for DM, RAAS, and statin medications.  Patient Self Care Activities:  . Patient will check blood glucose daily , document, and  provide at future appointments . Patient will take medications as prescribed . Patient will report any questions or concerns to provider   Please see past updates related to this goal by clicking on the "Past Updates" button in the selected goal         The patient verbalized understanding of instructions provided today and declined a print copy of patient instruction materials.    Plan:  - Scheduled outreach call 05/24/19   Catie Darnelle Maffucci, PharmD, [place] [phone]

## 2019-05-19 ENCOUNTER — Encounter: Payer: Self-pay | Admitting: Family Medicine

## 2019-05-19 ENCOUNTER — Other Ambulatory Visit: Payer: Self-pay

## 2019-05-19 ENCOUNTER — Ambulatory Visit (INDEPENDENT_AMBULATORY_CARE_PROVIDER_SITE_OTHER): Payer: Medicare Other | Admitting: Family Medicine

## 2019-05-19 VITALS — BP 144/83 | HR 89 | Temp 98.4°F | Ht 65.0 in | Wt 187.8 lb

## 2019-05-19 DIAGNOSIS — I129 Hypertensive chronic kidney disease with stage 1 through stage 4 chronic kidney disease, or unspecified chronic kidney disease: Secondary | ICD-10-CM

## 2019-05-19 DIAGNOSIS — N182 Chronic kidney disease, stage 2 (mild): Secondary | ICD-10-CM | POA: Diagnosis not present

## 2019-05-19 DIAGNOSIS — E782 Mixed hyperlipidemia: Secondary | ICD-10-CM

## 2019-05-19 DIAGNOSIS — E1122 Type 2 diabetes mellitus with diabetic chronic kidney disease: Secondary | ICD-10-CM

## 2019-05-19 DIAGNOSIS — F3341 Major depressive disorder, recurrent, in partial remission: Secondary | ICD-10-CM | POA: Diagnosis not present

## 2019-05-19 LAB — MICROALBUMIN, URINE WAIVED
Creatinine, Urine Waived: 200 mg/dL (ref 10–300)
Microalb, Ur Waived: 30 mg/L — ABNORMAL HIGH (ref 0–19)
Microalb/Creat Ratio: <30 mg/g (ref <30)

## 2019-05-19 LAB — BAYER DCA HB A1C WAIVED: HB A1C (BAYER DCA - WAIVED): 8.2 % — ABNORMAL HIGH (ref <7.0)

## 2019-05-19 MED ORDER — LISINOPRIL-HYDROCHLOROTHIAZIDE 20-25 MG PO TABS: 2.0000 | ORAL_TABLET | Freq: Every day | ORAL | 1 refills | Status: DC

## 2019-05-19 MED ORDER — METFORMIN HCL 1000 MG PO TABS: 1000.0000 mg | ORAL_TABLET | Freq: Two times a day (BID) | ORAL | 1 refills | Status: DC

## 2019-05-19 MED ORDER — ROSUVASTATIN CALCIUM 10 MG PO TABS: 10.0000 mg | ORAL_TABLET | Freq: Every day | ORAL | 1 refills | Status: DC

## 2019-05-19 MED ORDER — OMEPRAZOLE 20 MG PO CPDR: 40.0000 mg | DELAYED_RELEASE_CAPSULE | Freq: Every day | ORAL | 1 refills | Status: DC

## 2019-05-19 MED ORDER — TRULICITY 0.75 MG/0.5ML ~~LOC~~ SOAJ: 0.7500 mg | SUBCUTANEOUS | 1 refills | Status: DC

## 2019-05-19 MED ORDER — BUPROPION HCL ER (XL) 300 MG PO TB24: 300.0000 mg | ORAL_TABLET | Freq: Every day | ORAL | 1 refills | Status: DC

## 2019-05-19 MED ORDER — GLIPIZIDE 5 MG PO TABS: ORAL_TABLET | ORAL | 1 refills | Status: DC

## 2019-05-19 MED ORDER — ONETOUCH DELICA LANCETS 33G MISC: 12 refills | Status: DC

## 2019-05-19 MED ORDER — GLUCOSE BLOOD VI STRP: ORAL_STRIP | 12 refills | Status: DC

## 2019-05-19 NOTE — Assessment & Plan Note (Signed)
Running slightly high. Will recheck in 1 month after starting trulicity. Refills given. Call with any concerns.

## 2019-05-19 NOTE — Assessment & Plan Note (Signed)
Not under good control with A1c of 8.2- up from 7.1. Will stop [place] and start trulicity and continue metformin and glipizide. Continue to monitor. Recheck 1 month. Call with any concerns.

## 2019-05-19 NOTE — Assessment & Plan Note (Signed)
Under good control on current regimen. Continue current regimen. Continue to monitor. Call with any concerns. Refills given. Labs drawn today.   

## 2019-05-19 NOTE — Progress Notes (Signed)
BP (!) 144/83 (BP Location: Left Arm, Patient Position: Sitting, Cuff Size: Normal)   Pulse 89   Temp 98.4 F (36.9 C) (Oral)   Ht 5\' 5"  (1.651 m)   Wt 187 lb 12.8 oz (85.2 kg)   SpO2 97%   BMI 31.25 kg/m    Subjective:    Patient ID: , female    DOB: 02-21-1946, 74 y.o.   MRN: [id number]  HPI: Alison Hernandez is a 74 y.o. female  Chief Complaint  Patient presents with  . Diabetes    No Complaints   DIABETES Hypoglycemic episodes:no Polydipsia/polyuria: no Visual disturbance: no Chest pain: no Paresthesias: no Glucose Monitoring: no- has been out of her strips and lancets Taking Insulin?: no Blood Pressure Monitoring: not checking Retinal Examination: Not up to Date Foot Exam: Not up to Date Diabetic Education: Completed Pneumovax: Up to Date Influenza: Up to Date Aspirin: no  HYPERTENSION / HYPERLIPIDEMIA Satisfied with current treatment? yes Duration of hypertension: chronic BP monitoring frequency: not checking BP medication side effects: no Past BP meds: lisinopril-HCTZ Duration of hyperlipidemia: chronic Cholesterol medication side effects: no Cholesterol supplements: none Past cholesterol medications: crestor Medication compliance: excellent compliance Aspirin: no Recent stressors: yes Recurrent headaches: no Visual changes: no Palpitations: no Dyspnea: no Chest pain: no Lower extremity edema: no Dizzy/lightheaded: no   DEPRESSION Mood status: stable Satisfied with current treatment?: no Symptom severity: severe  Duration of current treatment : years Side effects: no Medication compliance: excellent compliance Psychotherapy/counseling: no  Previous psychiatric medications: wellbutrin Depressed mood: no Anxious mood: no Anhedonia: no Significant weight loss or gain: no Insomnia: no  Fatigue: yes Feelings of worthlessness or guilt: no Impaired concentration/indecisiveness: no Suicidal ideations: no Hopelessness:  no Crying spells: no Depression screen [facility] 2/9 05/19/2019 11/18/2018 04/07/2018 11/18/2017 07/15/2017  Decreased Interest 0 - 0 0 0  Down, Depressed, Hopeless 0 0 0 0 0  PHQ - 2 Score 0 0 0 0 0  Altered sleeping 0 0 0 0 0  Tired, decreased energy 0 0 0 0 0  Change in appetite 0 0 0 0 0  Feeling bad or failure about yourself  0 0 0 0 0  Trouble concentrating 0 0 0 0 0  Moving slowly or fidgety/restless 0 0 0 0 0  Suicidal thoughts 0 0 0 0 0  PHQ-9 Score 0 0 0 0 0  Difficult doing work/chores Not difficult at all Not difficult at all Not difficult at all Not difficult at all Not difficult at all     Relevant past medical, surgical, family and social history reviewed and updated as indicated. Interim medical history since our last visit reviewed. Allergies and medications reviewed and updated.  Review of Systems  Constitutional: Negative.   Respiratory: Negative.   Cardiovascular: Negative.   Musculoskeletal: Negative.   Neurological: Negative.   Psychiatric/Behavioral: Negative.     Per HPI unless specifically indicated above     Objective:    BP (!) 144/83 (BP Location: Left Arm, Patient Position: Sitting, Cuff Size: Normal)   Pulse 89   Temp 98.4 F (36.9 C) (Oral)   Ht 5\' 5"  (1.651 m)   Wt 187 lb 12.8 oz (85.2 kg)   SpO2 97%   BMI 31.25 kg/m   Wt Readings from Last 3 Encounters:  05/19/19 187 lb 12.8 oz (85.2 kg)  12/31/18 195 lb (88.5 kg)  11/18/18 193 lb 4 oz (87.7 kg)    Physical Exam Vitals and nursing note  reviewed.  Constitutional:      General: She is not in acute distress.    Appearance: Normal appearance. She is not ill-appearing, toxic-appearing or diaphoretic.  HENT:     Head: Normocephalic and atraumatic.     Right Ear: External ear normal.     Left Ear: External ear normal.     Nose: Nose normal.     Mouth/Throat:     Mouth: Mucous membranes are moist.     Pharynx: Oropharynx is clear.  Eyes:     General: No scleral icterus.       Right eye: No  discharge.        Left eye: No discharge.     Extraocular Movements: Extraocular movements intact.     Conjunctiva/sclera: Conjunctivae normal.     Pupils: Pupils are equal, round, and reactive to light.  Cardiovascular:     Rate and Rhythm: Normal rate and regular rhythm.     Pulses: Normal pulses.     Heart sounds: Normal heart sounds. No murmur. No friction rub. No gallop.   Pulmonary:     Effort: Pulmonary effort is normal. No respiratory distress.     Breath sounds: Normal breath sounds. No stridor. No wheezing, rhonchi or rales.  Chest:     Chest wall: No tenderness.  Musculoskeletal:        General: Normal range of motion.     Cervical back: Normal range of motion and neck supple.  Skin:    General: Skin is warm and dry.     Capillary Refill: Capillary refill takes less than 2 seconds.     Coloration: Skin is not jaundiced or pale.     Findings: No bruising, erythema, lesion or rash.  Neurological:     General: No focal deficit present.     Mental Status: She is alert and oriented to person, place, and time. Mental status is at baseline.  Psychiatric:        Mood and Affect: Mood normal.        Behavior: Behavior normal.        Thought Content: Thought content normal.        Judgment: Judgment normal.     Results for orders placed or performed in visit on 11/18/18  Microscopic Examination   BLD  Result Value Ref Range   WBC, UA None seen 0 - 5 /hpf   RBC None seen 0 - 2 /hpf   Epithelial Cells (non renal) 0-10 0 - 10 /hpf   Bacteria, UA None seen None seen/Few  Bayer DCA Hb A1c Waived  Result Value Ref Range   HB A1C (BAYER DCA - WAIVED) 7.1 (H) <7.0 %  CBC with Differential/Platelet  Result Value Ref Range   WBC 7.5 3.4 - 10.8 x10E3/uL   RBC 3.54 (L) 3.77 - 5.28 x10E6/uL   Hemoglobin 10.3 (L) 11.1 - 15.9 g/dL   Hematocrit 30.3 (L) 34.0 - 46.6 %   MCV 86 79 - 97 fL   MCH 29.1 26.6 - 33.0 pg   MCHC 34.0 31.5 - 35.7 g/dL   RDW 13.6 11.7 - 15.4 %   Platelets  229 150 - 450 x10E3/uL   Neutrophils 59 Not Estab. %   Lymphs 31 Not Estab. %   Monocytes 8 Not Estab. %   Eos 2 Not Estab. %   Basos 0 Not Estab. %   Neutrophils Absolute 4.4 1.4 - 7.0 x10E3/uL   Lymphocytes Absolute 2.3 0.7 - 3.1 x10E3/uL  Monocytes Absolute 0.6 0.1 - 0.9 x10E3/uL   EOS (ABSOLUTE) 0.1 0.0 - 0.4 x10E3/uL   Basophils Absolute 0.0 0.0 - 0.2 x10E3/uL   Immature Granulocytes 0 Not Estab. %   Immature Grans (Abs) 0.0 0.0 - 0.1 x10E3/uL  Comprehensive metabolic panel  Result Value Ref Range   Glucose 89 65 - 99 mg/dL   BUN 28 (H) 8 - 27 mg/dL   Creatinine, Ser 9.37 (H) 0.57 - 1.00 mg/dL   GFR calc non Af Amer 51 (L) >59 mL/min/1.73   GFR calc Af Amer 59 (L) >59 mL/min/1.73   BUN/Creatinine Ratio 26 12 - 28   Sodium 139 134 - 144 mmol/L   Potassium 4.4 3.5 - 5.2 mmol/L   Chloride 101 96 - 106 mmol/L   CO2 23 20 - 29 mmol/L   Calcium 9.2 8.7 - 10.3 mg/dL   Total Protein 6.7 6.0 - 8.5 g/dL   Albumin 4.3 3.7 - 4.7 g/dL   Globulin, Total 2.4 1.5 - 4.5 g/dL   Albumin/Globulin Ratio 1.8 1.2 - 2.2   Bilirubin Total 0.3 0.0 - 1.2 mg/dL   Alkaline Phosphatase 78 39 - 117 IU/L   AST 12 0 - 40 IU/L   ALT 10 0 - 32 IU/L  Lipid Panel w/o Chol/HDL Ratio  Result Value Ref Range   Cholesterol, Total 129 100 - 199 mg/dL   Triglycerides 342 0 - 149 mg/dL   HDL 51 >87 mg/dL   VLDL Cholesterol Cal 28 5 - 40 mg/dL   LDL Calculated 50 0 - 99 mg/dL  Microalbumin, Urine Waived  Result Value Ref Range   Microalb, Ur Waived 80 (H) 0 - 19 mg/L   Creatinine, Urine Waived 200 10 - 300 mg/dL   Microalb/Creat Ratio 30-300 (H) <30 mg/g  TSH  Result Value Ref Range   TSH 1.930 0.450 - 4.500 uIU/mL  UA/M w/rflx Culture, Routine   Specimen: Blood   BLD  Result Value Ref Range   Specific Gravity, UA 1.020 1.005 - 1.030   pH, UA 5.0 5.0 - 7.5   Color, UA Yellow Yellow   Appearance Ur Clear Clear   Leukocytes,UA Negative Negative   Protein,UA Trace (A) Negative/Trace   Glucose, UA  Negative Negative   Ketones, UA Trace (A) Negative   RBC, UA Negative Negative   Bilirubin, UA Negative Negative   Urobilinogen, Ur 0.2 0.2 - 1.0 mg/dL   Nitrite, UA Negative Negative   Microscopic Examination See below:       Assessment & Plan:   Problem List Items Addressed This Visit      Endocrine   Diabetes (HCC) - Primary    Not under good control with A1c of 8.2- up from 7.1. Will stop [place] and start trulicity and continue metformin and glipizide. Continue to monitor. Recheck 1 month. Call with any concerns.       Relevant Medications   glipiZIDE (GLUCOTROL) 5 MG tablet   lisinopril-hydrochlorothiazide (ZESTORETIC) 20-25 MG tablet   rosuvastatin (CRESTOR) 10 MG tablet   metFORMIN (GLUCOPHAGE) 1000 MG tablet   Dulaglutide (TRULICITY) 0.75 MG/0.5ML SOPN   Other Relevant Orders   Bayer DCA Hb A1c Waived   CBC with Differential/Platelet   Comprehensive metabolic panel   Microalbumin, Urine Waived     Genitourinary   Benign hypertensive renal disease    Running slightly high. Will recheck in 1 month after starting trulicity. Refills given. Call with any concerns.       Relevant Orders  CBC with Differential/Platelet   Comprehensive metabolic panel   Microalbumin, Urine Waived   TSH     Other   Hyperlipidemia    Under good control on current regimen. Continue current regimen. Continue to monitor. Call with any concerns. Refills given. Labs drawn today.       Relevant Medications   lisinopril-hydrochlorothiazide (ZESTORETIC) 20-25 MG tablet   rosuvastatin (CRESTOR) 10 MG tablet   Other Relevant Orders   CBC with Differential/Platelet   Comprehensive metabolic panel   Lipid Panel w/o Chol/HDL Ratio   Depression    Under good control on current regimen. Continue current regimen. Continue to monitor. Call with any concerns. Refills given. Labs drawn today.       Relevant Medications   buPROPion (WELLBUTRIN XL) 300 MG 24 hr tablet       Follow up  plan: Return in about 4 weeks (around 06/16/2019).

## 2019-05-20 LAB — CBC WITH DIFFERENTIAL/PLATELET
Basophils Absolute: 0 10*3/uL (ref 0.0–0.2)
Basos: 0 %
EOS (ABSOLUTE): 0.1 10*3/uL (ref 0.0–0.4)
Eos: 1 %
Hematocrit: 32.1 % — ABNORMAL LOW (ref 34.0–46.6)
Hemoglobin: 10.6 g/dL — ABNORMAL LOW (ref 11.1–15.9)
Immature Grans (Abs): 0 10*3/uL (ref 0.0–0.1)
Immature Granulocytes: 0 %
Lymphocytes Absolute: 1.9 10*3/uL (ref 0.7–3.1)
Lymphs: 28 %
MCH: 28.1 pg (ref 26.6–33.0)
MCHC: 33 g/dL (ref 31.5–35.7)
MCV: 85 fL (ref 79–97)
Monocytes Absolute: 0.4 10*3/uL (ref 0.1–0.9)
Monocytes: 6 %
Neutrophils Absolute: 4.4 10*3/uL (ref 1.4–7.0)
Neutrophils: 65 %
Platelets: 245 10*3/uL (ref 150–450)
RBC: 3.77 x10E6/uL (ref 3.77–5.28)
RDW: 13.4 % (ref 11.7–15.4)
WBC: 6.8 10*3/uL (ref 3.4–10.8)

## 2019-05-20 LAB — COMPREHENSIVE METABOLIC PANEL
ALT: 9 IU/L (ref 0–32)
AST: 12 IU/L (ref 0–40)
Albumin/Globulin Ratio: 1.5 (ref 1.2–2.2)
Albumin: 4.2 g/dL (ref 3.7–4.7)
Alkaline Phosphatase: 98 IU/L (ref 39–117)
BUN/Creatinine Ratio: 21 (ref 12–28)
BUN: 26 mg/dL (ref 8–27)
Bilirubin Total: 0.4 mg/dL (ref 0.0–1.2)
CO2: 22 mmol/L (ref 20–29)
Calcium: 9.3 mg/dL (ref 8.7–10.3)
Chloride: 104 mmol/L (ref 96–106)
Creatinine, Ser: 1.22 mg/dL — ABNORMAL HIGH (ref 0.57–1.00)
GFR calc Af Amer: 51 mL/min/{1.73_m2} — ABNORMAL LOW (ref >59)
GFR calc non Af Amer: 44 mL/min/{1.73_m2} — ABNORMAL LOW (ref >59)
Globulin, Total: 2.8 g/dL (ref 1.5–4.5)
Glucose: 142 mg/dL — ABNORMAL HIGH (ref 65–99)
Potassium: 4.5 mmol/L (ref 3.5–5.2)
Sodium: 139 mmol/L (ref 134–144)
Total Protein: 7 g/dL (ref 6.0–8.5)

## 2019-05-20 LAB — LIPID PANEL W/O CHOL/HDL RATIO
Cholesterol, Total: 155 mg/dL (ref 100–199)
HDL: 56 mg/dL (ref >39)
LDL Chol Calc (NIH): 78 mg/dL (ref 0–99)
Triglycerides: 121 mg/dL (ref 0–149)
VLDL Cholesterol Cal: 21 mg/dL (ref 5–40)

## 2019-05-20 LAB — TSH: TSH: 2.76 u[IU]/mL (ref 0.450–4.500)

## 2019-05-24 ENCOUNTER — Telehealth: Payer: Self-pay

## 2019-05-24 ENCOUNTER — Ambulatory Visit: Payer: Self-pay | Admitting: Pharmacist

## 2019-05-24 DIAGNOSIS — E1122 Type 2 diabetes mellitus with diabetic chronic kidney disease: Secondary | ICD-10-CM

## 2019-05-24 DIAGNOSIS — N182 Chronic kidney disease, stage 2 (mild): Secondary | ICD-10-CM

## 2019-05-24 NOTE — Chronic Care Management (AMB) (Signed)
Chronic Care Management   Follow Up Note   05/24/2019 Name: Alison Hernandez MRN: [id number] DOB: Oct 01, 1945  Referred by: Valerie Roys, DO Reason for referral : Chronic Care Management (Medication Management)   Alison Hernandez is a 74 y.o. year old female who is a primary care patient of Valerie Roys, DO. The CCM team was consulted for assistance with chronic disease management and care coordination needs.    Contacted patient for medication management review.   Review of patient status, including review of consultants reports, relevant laboratory and other test results, and collaboration with appropriate care team members and the patient's provider was performed as part of comprehensive patient evaluation and provision of chronic care management services.    SDOH (Social Determinants of Health) assessments performed: Yes See Care Plan activities for detailed interventions related to [facility])     Outpatient Encounter Medications as of 05/24/2019  Medication Sig  . Dulaglutide (TRULICITY) 0.09 QZ/3.0QT SOPN Inject 0.75 mg into the skin once a week.  . Blood Glucose Monitoring Suppl (ONE TOUCH ULTRA 2) w/Device KIT Test 3x a day  . buPROPion (WELLBUTRIN XL) 300 MG 24 hr tablet Take 1 tablet (300 mg total) by mouth daily.  . diclofenac sodium (VOLTAREN) 1 % GEL Apply 4 g topically 4 (four) times daily.  . ferrous sulfate (FERROUSUL) 325 (65 FE) MG tablet Take 1 tablet (325 mg total) by mouth 2 (two) times daily with a meal.  . glipiZIDE (GLUCOTROL) 5 MG tablet TAKE 1 TABLET BY MOUTH ONCE DAILY BEFORE BREAKFAST  . glucose blood test strip Use as instructed  . lisinopril-hydrochlorothiazide (ZESTORETIC) 20-25 MG tablet Take 2 tablets by mouth daily.  . meclizine (ANTIVERT) 25 MG tablet Take 1 tablet (25 mg total) by mouth 3 (three) times daily as needed for dizziness.  . metFORMIN (GLUCOPHAGE) 1000 MG tablet Take 1 tablet (1,000 mg total) by mouth 2 (two) times daily with a meal.  .  Multiple Vitamin (MULTIVITAMIN) tablet Take 1 tablet by mouth daily.  Marland Kitchen nystatin (MYCOSTATIN/NYSTOP) powder Apply topically 4 (four) times daily.  Marland Kitchen omeprazole (PRILOSEC) 20 MG capsule Take 2 capsules (40 mg total) by mouth daily.  . ondansetron (ZOFRAN-ODT) 8 MG disintegrating tablet Take 1 tablet (8 mg total) by mouth every 8 (eight) hours as needed for nausea or vomiting.  . ONE TOUCH ULTRA TEST test strip Test 3x a day  . OneTouch Delica Lancets 62U MISC Test 3x a day  . rosuvastatin (CRESTOR) 10 MG tablet Take 1 tablet (10 mg total) by mouth daily.   No facility-administered encounter medications on file as of 05/24/2019.     Objective:   Goals Addressed            This Visit's Progress     Patient Stated   . PharmD "I want to stay healthy (pt-stated)       CARE PLAN ENTRY (see longtitudinal plan of care for additional care plan information)  Current Barriers:  . Diabetes: uncontrolled, most recent A1c 8.2%  o Concerned about sugars still being elevated since starting Trulicity.  o Endorses some nausea; denies rash/allergic reaction, vomiting, or diarrhea o Notes $63 copay for Trulicity was affordable for her . Current antihyperglycemic regimen: metformin 1000 mg BID, glipizide 5 mg QAM, Trulicity 3.35 mg weekly  . Most recent eGFR 44 mL/min o Hx Scr increase w/ Jardiance; eGFR currently appropriate for initiation, could consider re-initiation in the future, as we know have long term data supporting renal  benefit o Hx rash w/ Bydureon . Denies any issues w/ hypoglycemia . Fasting: 180-200s; reports pre lunch 163 yesterday . Cardiovascular risk reduction: o Current hypertensive regimen: lisinopril 20/25 mg; 2 tablets daily; BP at goal <130/80 o Current hyperlipidemia regimen: rosuvastatin 10 mg daily; LDL at goal <70 . Depression: bupropion XL 300 mg daily   Pharmacist Clinical Goal(s):  Marland Kitchen Over the next 90 days, patient will work with PharmD and primary care provider to  address optimized medication management and medication access concerns  Interventions: . Reviewed tolerability of Trulicity. Discussed that nausea is normal, and should improve with continued dosing.  . Discussed that it can take 3-5 weeks to reach steady state of this medication as it is dosed weekly. Encouraged continued BG checks and continued adherence. Discussed options for increasing to 1.5 mg weekly at next f/u with Dr Wynetta Emery, pending sugars and tolerability.  . Discussed option for patient assistance moving forward as needed, once stable dose is reached.   Patient Self Care Activities:  . Patient will check blood glucose daily , document, and provide at future appointments . Patient will take medications as prescribed . Patient will report any questions or concerns to provider   Please see past updates related to this goal by clicking on the "Past Updates" button in the selected goal          Plan:  - Scheduled f/u call 07/19/19  Catie Darnelle Maffucci, PharmD, [place] [phone]

## 2019-05-24 NOTE — Patient Instructions (Signed)
Visit Information  Goals Addressed            This Visit's Progress     Patient Stated   . PharmD "I want to stay healthy (pt-stated)       CARE PLAN ENTRY (see longtitudinal plan of care for additional care plan information)  Current Barriers:  . Diabetes: uncontrolled, most recent A1c 8.2%  o Concerned about sugars still being elevated since starting Trulicity.  o Endorses some nausea; denies rash/allergic reaction, vomiting, or diarrhea o Notes $63 copay for Trulicity was affordable for her . Current antihyperglycemic regimen: metformin 1000 mg BID, glipizide 5 mg QAM, Trulicity 1.49 mg weekly  . Most recent eGFR 44 mL/min o Hx Scr increase w/ Jardiance; eGFR currently appropriate for initiation, could consider re-initiation in the future, as we know have long term data supporting renal benefit o Hx rash w/ Bydureon . Denies any issues w/ hypoglycemia . Fasting: 180-200s; reports pre lunch 163 yesterday . Cardiovascular risk reduction: o Current hypertensive regimen: lisinopril 20/25 mg; 2 tablets daily; BP at goal <130/80 o Current hyperlipidemia regimen: rosuvastatin 10 mg daily; LDL at goal <70 . Depression: bupropion XL 300 mg daily   Pharmacist Clinical Goal(s):  Marland Kitchen Over the next 90 days, patient will work with PharmD and primary care provider to address optimized medication management and medication access concerns  Interventions: . Reviewed tolerability of Trulicity. Discussed that nausea is normal, and should improve with continued dosing.  . Discussed that it can take 3-5 weeks to reach steady state of this medication as it is dosed weekly. Encouraged continued BG checks and continued adherence. Discussed options for increasing to 1.5 mg weekly at next f/u with Dr Wynetta Emery, pending sugars and tolerability.  . Discussed option for patient assistance moving forward as needed, once stable dose is reached.   Patient Self Care Activities:  . Patient will check blood  glucose daily , document, and provide at future appointments . Patient will take medications as prescribed . Patient will report any questions or concerns to provider   Please see past updates related to this goal by clicking on the "Past Updates" button in the selected goal         Patient verbalizes understanding of instructions provided today.    Plan:  - Scheduled f/u call 07/19/19  Catie Darnelle Maffucci, PharmD, [place] [phone]

## 2019-06-02 ENCOUNTER — Ambulatory Visit: Payer: Medicare Other | Attending: Internal Medicine

## 2019-06-02 DIAGNOSIS — Z23 Encounter for immunization: Secondary | ICD-10-CM

## 2019-06-02 NOTE — Progress Notes (Signed)
   Covid-19 Vaccination Clinic  Name:  Alison Hernandez    MRN: [id number] DOB: 1945/11/10  06/02/2019  [name] was observed post Covid-19 immunization for 15 minutes without incident. She was provided with Vaccine Information Sheet and instruction to access the V-Safe system.   [name] was instructed to call 911 with any severe reactions post vaccine: Marland Kitchen Difficulty breathing  . Swelling of face and throat  . A fast heartbeat  . A bad rash all over body  . Dizziness and weakness   Immunizations Administered    Name Date Dose VIS Date Route   Pfizer COVID-19 Vaccine 06/02/2019  8:31 AM 0.3 mL 03/04/2019 Intramuscular   Manufacturer: [employer], [employer]   Lot: [id number]   NDC: [address]-8

## 2019-06-20 ENCOUNTER — Encounter: Payer: Self-pay | Admitting: Family Medicine

## 2019-06-20 ENCOUNTER — Ambulatory Visit: Payer: Medicare Other | Admitting: Family Medicine

## 2019-06-20 ENCOUNTER — Telehealth (INDEPENDENT_AMBULATORY_CARE_PROVIDER_SITE_OTHER): Payer: Medicare Other | Admitting: Family Medicine

## 2019-06-20 VITALS — BP 120/60 | HR 90 | Wt 188.0 lb

## 2019-06-20 DIAGNOSIS — E1122 Type 2 diabetes mellitus with diabetic chronic kidney disease: Secondary | ICD-10-CM | POA: Diagnosis not present

## 2019-06-20 DIAGNOSIS — I129 Hypertensive chronic kidney disease with stage 1 through stage 4 chronic kidney disease, or unspecified chronic kidney disease: Secondary | ICD-10-CM | POA: Diagnosis not present

## 2019-06-20 DIAGNOSIS — N182 Chronic kidney disease, stage 2 (mild): Secondary | ICD-10-CM

## 2019-06-20 MED ORDER — TRULICITY 1.5 MG/0.5ML ~~LOC~~ SOAJ: 1.5000 mg | SUBCUTANEOUS | 3 refills | Status: DC

## 2019-06-20 NOTE — Assessment & Plan Note (Signed)
Under good control. Continue current regimen. Continue to monitor. Call with any concerns. 

## 2019-06-20 NOTE — Progress Notes (Signed)
BP 120/60   Pulse 90   Wt 188 lb (85.3 kg)   BMI 31.28 kg/m    Subjective:    Patient ID: Alison Hernandez, female    DOB: [birth year], 74 y.o.   MRN: [id number]  HPI: Alison Hernandez is a 74 y.o. female  Chief Complaint  Patient presents with  . Diabetes  . Hypertension   DIABETES- started on the trulicity last month Hypoglycemic episodes:no Polydipsia/polyuria: no Visual disturbance: no Chest pain: no Paresthesias: yes Glucose Monitoring: yes  Accucheck frequency: Daily  Fasting glucose: 238  Post prandial: 258 Taking Insulin?: no Blood Pressure Monitoring: not checking Retinal Examination: Not up to Date Foot Exam: Not up to Date Diabetic Education: Completed Pneumovax: Up to Date Influenza: Up to Date Aspirin: yes  HYPERTENSION Hypertension status: better  Satisfied with current treatment? yes Duration of hypertension: chronic BP monitoring frequency:  rarely BP medication side effects:  no Medication compliance: excellent compliance Aspirin: yes Recurrent headaches: no Visual changes: no Palpitations: no Dyspnea: no Chest pain: no Lower extremity edema: no Dizzy/lightheaded: no   Relevant past medical, surgical, family and social history reviewed and updated as indicated. Interim medical history since our last visit reviewed. Allergies and medications reviewed and updated.  Review of Systems  Constitutional: Negative.   Respiratory: Negative.   Cardiovascular: Negative.   Gastrointestinal: Negative.   Musculoskeletal: Negative.   Neurological: Negative.   Psychiatric/Behavioral: Negative.     Per HPI unless specifically indicated above     Objective:    BP 120/60   Pulse 90   Wt 188 lb (85.3 kg)   BMI 31.28 kg/m   Wt Readings from Last 3 Encounters:  06/20/19 188 lb (85.3 kg)  05/19/19 187 lb 12.8 oz (85.2 kg)  12/31/18 195 lb (88.5 kg)    Physical Exam Vitals and nursing note reviewed.  Constitutional:      General: She is not  in acute distress.    Appearance: Normal appearance. She is not ill-appearing, toxic-appearing or diaphoretic.  HENT:     Head: Normocephalic and atraumatic.     Right Ear: External ear normal.     Left Ear: External ear normal.     Nose: Nose normal.     Mouth/Throat:     Mouth: Mucous membranes are moist.     Pharynx: Oropharynx is clear.  Eyes:     General: No scleral icterus.       Right eye: No discharge.        Left eye: No discharge.     Conjunctiva/sclera: Conjunctivae normal.     Pupils: Pupils are equal, round, and reactive to light.  Pulmonary:     Effort: Pulmonary effort is normal. No respiratory distress.     Comments: Speaking in full sentences Musculoskeletal:        General: Normal range of motion.     Cervical back: Normal range of motion.  Skin:    Coloration: Skin is not jaundiced or pale.     Findings: No bruising, erythema, lesion or rash.  Neurological:     Mental Status: She is alert and oriented to person, place, and time. Mental status is at baseline.  Psychiatric:        Mood and Affect: Mood normal.        Behavior: Behavior normal.        Thought Content: Thought content normal.        Judgment: Judgment normal.     Results for  orders placed or performed in visit on 05/19/19  Bayer DCA Hb A1c Waived  Result Value Ref Range   HB A1C (BAYER DCA - WAIVED) 8.2 (H) <7.0 %  CBC with Differential/Platelet  Result Value Ref Range   WBC 6.8 3.4 - 10.8 x10E3/uL   RBC 3.77 3.77 - 5.28 x10E6/uL   Hemoglobin 10.6 (L) 11.1 - 15.9 g/dL   Hematocrit 32.1 (L) 34.0 - 46.6 %   MCV 85 79 - 97 fL   MCH 28.1 26.6 - 33.0 pg   MCHC 33.0 31.5 - 35.7 g/dL   RDW 13.4 11.7 - 15.4 %   Platelets 245 150 - 450 x10E3/uL   Neutrophils 65 Not Estab. %   Lymphs 28 Not Estab. %   Monocytes 6 Not Estab. %   Eos 1 Not Estab. %   Basos 0 Not Estab. %   Neutrophils Absolute 4.4 1.4 - 7.0 x10E3/uL   Lymphocytes Absolute 1.9 0.7 - 3.1 x10E3/uL   Monocytes Absolute 0.4 0.1  - 0.9 x10E3/uL   EOS (ABSOLUTE) 0.1 0.0 - 0.4 x10E3/uL   Basophils Absolute 0.0 0.0 - 0.2 x10E3/uL   Immature Granulocytes 0 Not Estab. %   Immature Grans (Abs) 0.0 0.0 - 0.1 x10E3/uL  Comprehensive metabolic panel  Result Value Ref Range   Glucose 142 (H) 65 - 99 mg/dL   BUN 26 8 - 27 mg/dL   Creatinine, Ser 1.22 (H) 0.57 - 1.00 mg/dL   GFR calc non Af Amer 44 (L) >59 mL/min/1.73   GFR calc Af Amer 51 (L) >59 mL/min/1.73   BUN/Creatinine Ratio 21 12 - 28   Sodium 139 134 - 144 mmol/L   Potassium 4.5 3.5 - 5.2 mmol/L   Chloride 104 96 - 106 mmol/L   CO2 22 20 - 29 mmol/L   Calcium 9.3 8.7 - 10.3 mg/dL   Total Protein 7.0 6.0 - 8.5 g/dL   Albumin 4.2 3.7 - 4.7 g/dL   Globulin, Total 2.8 1.5 - 4.5 g/dL   Albumin/Globulin Ratio 1.5 1.2 - 2.2   Bilirubin Total 0.4 0.0 - 1.2 mg/dL   Alkaline Phosphatase 98 39 - 117 IU/L   AST 12 0 - 40 IU/L   ALT 9 0 - 32 IU/L  Lipid Panel w/o Chol/HDL Ratio  Result Value Ref Range   Cholesterol, Total 155 100 - 199 mg/dL   Triglycerides 121 0 - 149 mg/dL   HDL 56 >39 mg/dL   VLDL Cholesterol Cal 21 5 - 40 mg/dL   LDL Chol Calc (NIH) 78 0 - 99 mg/dL  Microalbumin, Urine Waived  Result Value Ref Range   Microalb, Ur Waived 30 (H) 0 - 19 mg/L   Creatinine, Urine Waived 200 10 - 300 mg/dL   Microalb/Creat Ratio <30 <30 mg/g  TSH  Result Value Ref Range   TSH 2.760 0.450 - 4.500 uIU/mL      Assessment & Plan:   Problem List Items Addressed This Visit      Endocrine   Diabetes ([place])    Sugars continue to run high. Will increase trulicity to 1.5mg  and recheck 1 month. Call with any concerns. Continue to monitor.       Relevant Medications   Dulaglutide (TRULICITY) 1.5 WJ/1.9JY SOPN     Genitourinary   Benign hypertensive renal disease - Primary    Under good control. Continue current regimen. Continue to monitor. Call with any concerns.           Follow up plan:  Return in about 4 weeks (around 07/18/2019).   . This visit was  completed via MyChart due to the restrictions of the COVID-19 pandemic. All issues as above were discussed and addressed. Physical exam was done as above through visual confirmation on MyChart. If it was felt that the patient should be evaluated in the office, they were directed there. The patient verbally consented to this visit. . Location of the patient: home . Location of the provider: work . Those involved with this call:  . Provider: Olevia Perches, DO . CMA: Tiffany Reel, CMA . Front Desk/Registration: Adela Ports  . Time spent on call: 25 minutes with patient face to face via video conference. More than 50% of this time was spent in counseling and coordination of care. 40 minutes total spent in review of patient's record and preparation of their chart.

## 2019-06-20 NOTE — Assessment & Plan Note (Signed)
Sugars continue to run high. Will increase trulicity to 1.5mg  and recheck 1 month. Call with any concerns. Continue to monitor.

## 2019-06-21 ENCOUNTER — Encounter: Payer: Self-pay | Admitting: Family Medicine

## 2019-06-21 ENCOUNTER — Telehealth: Payer: Self-pay | Admitting: Family Medicine

## 2019-06-21 NOTE — Telephone Encounter (Signed)
Called pt to schedule f/u, no answer, left vm, sending mychart message and letter by mail.

## 2019-06-21 NOTE — Telephone Encounter (Signed)
-----   Message from Dorcas Carrow, DO sent at 06/20/2019  8:44 AM EDT ----- 4 weeks follow up sugar (virtual OK)

## 2019-06-28 ENCOUNTER — Ambulatory Visit: Payer: Medicare Other | Attending: Internal Medicine

## 2019-06-28 DIAGNOSIS — Z23 Encounter for immunization: Secondary | ICD-10-CM

## 2019-06-28 NOTE — Progress Notes (Signed)
   Covid-19 Vaccination Clinic  Name:  Alison Hernandez    MRN: [id number] DOB: [birth year]  06/28/2019  [name] was observed post Covid-19 immunization for 15 minutes without incident. She was provided with Vaccine Information Sheet and instruction to access the V-Safe system.   [name] was instructed to call 911 with any severe reactions post vaccine: Marland Kitchen Difficulty breathing  . Swelling of face and throat  . A fast heartbeat  . A bad rash all over body  . Dizziness and weakness   Immunizations Administered    Name Date Dose VIS Date Route   Pfizer COVID-19 Vaccine 06/28/2019  8:35 AM 0.3 mL 03/04/2019 Intramuscular   Manufacturer: [employer], [employer]   Lot: [id number]   NDC: [address]-7

## 2019-07-11 ENCOUNTER — Other Ambulatory Visit: Payer: Self-pay | Admitting: Family Medicine

## 2019-07-11 NOTE — Telephone Encounter (Signed)
Requested medication (s) are due for refill today: yes  Requested medication (s) are on the active medication list: yes  Last refill:  03/24/2019  Future visit scheduled: yes  Notes to clinic:  This refill cannot be delegated   Requested Prescriptions  Pending Prescriptions Disp Refills   ondansetron (ZOFRAN-ODT) 8 MG disintegrating tablet [Pharmacy Med Name: Ondansetron 8 MG Oral Tablet Disintegrating] 60 tablet 0    Sig: DISSOLVE 1 TABLET IN MOUTH EVERY 8 HOURS AS NEEDED FOR NAUSEA OR VOMITING      Not Delegated - Gastroenterology: Antiemetics Failed - 07/11/2019  7:30 AM      Failed - This refill cannot be delegated      Passed - Valid encounter within last 6 months    Recent Outpatient Visits           3 weeks ago Benign hypertensive renal disease   [facility] [place], Megan P, DO   1 month ago Type 2 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 disease, without long-term current use of insulin (HCC)   [facility] [place], Megan P, DO   4 months ago Type 2 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 disease, without long-term current use of insulin (HCC)   [facility] [place], Megan P, DO   6 months ago Benign hypertensive renal disease   [facility] [place], Megan P, DO   7 months ago [employer] annual wellness visit, subsequent   [employer], [place], DO       Future Appointments             In 1 week Laural Benes, [employer], DO [employer]   In 4 months  [employer]              NYSTATIN powder [employer] Med Name: Nystop 100000 UNIT/GM External Powder] 15 g 0    Sig: APPLY  POWDER TOPICALLY 4 TIMES DAILY      Off-Protocol Failed - 07/11/2019  7:30 AM      Failed - Medication not assigned to a protocol, review manually.      Passed - Valid encounter within last 12 months    Recent Outpatient Visits           3 weeks ago Benign hypertensive renal  disease   [facility] [place], Megan P, DO   1 month ago Type 2 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 disease, without long-term current use of insulin (HCC)   [facility] [place], Megan P, DO   4 months ago Type 2 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 disease, without long-term current use of insulin (HCC)   [facility] [place], Megan P, DO   6 months ago Benign hypertensive renal disease   [facility] [place], Megan P, DO   7 months ago [employer] annual wellness visit, subsequent   [employer], [employer], DO       Future Appointments             In 1 week Laural Benes, Oralia Rud, DO [facility]   In 4 months  [employer]

## 2019-07-11 NOTE — Telephone Encounter (Signed)
LOV 06/20/19

## 2019-07-19 ENCOUNTER — Ambulatory Visit: Payer: Self-pay | Admitting: Pharmacist

## 2019-07-19 ENCOUNTER — Encounter: Payer: Self-pay | Admitting: Family Medicine

## 2019-07-19 ENCOUNTER — Ambulatory Visit (INDEPENDENT_AMBULATORY_CARE_PROVIDER_SITE_OTHER): Payer: Medicare Other | Admitting: Family Medicine

## 2019-07-19 ENCOUNTER — Telehealth: Payer: Self-pay

## 2019-07-19 ENCOUNTER — Other Ambulatory Visit: Payer: Self-pay

## 2019-07-19 DIAGNOSIS — E1122 Type 2 diabetes mellitus with diabetic chronic kidney disease: Secondary | ICD-10-CM | POA: Diagnosis not present

## 2019-07-19 DIAGNOSIS — N182 Chronic kidney disease, stage 2 (mild): Secondary | ICD-10-CM | POA: Diagnosis not present

## 2019-07-19 MED ORDER — MECLIZINE HCL 25 MG PO TABS: 25.0000 mg | ORAL_TABLET | Freq: Three times a day (TID) | ORAL | 6 refills | Status: DC | PRN

## 2019-07-19 MED ORDER — TRULICITY 3 MG/0.5ML ~~LOC~~ SOAJ: 3.0000 mg | SUBCUTANEOUS | 3 refills | Status: DC

## 2019-07-19 NOTE — Chronic Care Management (AMB) (Signed)
  Chronic Care Management   Note  07/19/2019 Name: [name] MRN: [id number] DOB: 06/19/1945  Alison Hernandez is a 74 y.o. year old female who is a primary care patient of Dorcas Carrow, DO. The CCM team was consulted for assistance with chronic disease management and care coordination needs.    Attempted to contact patient for medication management review. Left HIPAA compliant message for patient to return my call at their convenience. Patient has f/u with PCP in ~4 weeks, will collaborate to reschedule f/u call with me in ~ 8 weeks  Plan: - Will collaborate with Care Guide to outreach to schedule follow up with me  Catie Feliz Beam, PharmD, [facility] Clinical Pharmacist [facility] [phone]

## 2019-07-19 NOTE — Progress Notes (Signed)
BP 126/72 (BP Location: Left Arm, Patient Position: Sitting, Cuff Size: Normal)   Pulse 91   Temp 99.2 F (37.3 C) (Oral)   Wt 179 lb 12.8 oz (81.6 kg)   SpO2 100%   BMI 29.92 kg/m    Subjective:    Patient ID: Alison Hernandez, female    DOB: 1945/10/22, 74 y.o.   MRN: [id number]  HPI: Alison Hernandez is a 74 y.o. female  Chief Complaint  Patient presents with  . Diabetes   DIABETES Hypoglycemic episodes:yes- down to 70 Polydipsia/polyuria: yes Visual disturbance: no Chest pain: no Paresthesias: no Glucose Monitoring: yes  Accucheck frequency: Daily  Fasting glucose: 187, 195 Taking Insulin?: no  Long acting insulin:  Short acting insulin: Blood Pressure Monitoring: not checking Retinal Examination: Not up to Date Foot Exam: Up to Date Diabetic Education: Completed Pneumovax: Up to Date Influenza: Up to Date Aspirin: no  Relevant past medical, surgical, family and social history reviewed and updated as indicated. Interim medical history since our last visit reviewed. Allergies and medications reviewed and updated.  Review of Systems  Constitutional: Negative.   Respiratory: Negative.   Cardiovascular: Negative.   Gastrointestinal: Negative.   Musculoskeletal: Negative.   Neurological: Negative.   Psychiatric/Behavioral: Negative.     Per HPI unless specifically indicated above     Objective:    BP 126/72 (BP Location: Left Arm, Patient Position: Sitting, Cuff Size: Normal)   Pulse 91   Temp 99.2 F (37.3 C) (Oral)   Wt 179 lb 12.8 oz (81.6 kg)   SpO2 100%   BMI 29.92 kg/m   Wt Readings from Last 3 Encounters:  07/19/19 179 lb 12.8 oz (81.6 kg)  06/20/19 188 lb (85.3 kg)  05/19/19 187 lb 12.8 oz (85.2 kg)    Physical Exam Vitals and nursing note reviewed.  Constitutional:      General: She is not in acute distress.    Appearance: Normal appearance. She is not ill-appearing, toxic-appearing or diaphoretic.  HENT:     Head: Normocephalic and  atraumatic.     Right Ear: External ear normal.     Left Ear: External ear normal.     Nose: Nose normal.     Mouth/Throat:     Mouth: Mucous membranes are moist.     Pharynx: Oropharynx is clear.  Eyes:     General: No scleral icterus.       Right eye: No discharge.        Left eye: No discharge.     Extraocular Movements: Extraocular movements intact.     Conjunctiva/sclera: Conjunctivae normal.     Pupils: Pupils are equal, round, and reactive to light.  Cardiovascular:     Rate and Rhythm: Normal rate and regular rhythm.     Pulses: Normal pulses.     Heart sounds: Normal heart sounds. No murmur. No friction rub. No gallop.   Pulmonary:     Effort: Pulmonary effort is normal. No respiratory distress.     Breath sounds: Normal breath sounds. No stridor. No wheezing, rhonchi or rales.  Chest:     Chest wall: No tenderness.  Musculoskeletal:        General: Normal range of motion.     Cervical back: Normal range of motion and neck supple.  Skin:    General: Skin is warm and dry.     Capillary Refill: Capillary refill takes less than 2 seconds.     Coloration: Skin is not jaundiced or  pale.     Findings: No bruising, erythema, lesion or rash.  Neurological:     General: No focal deficit present.     Mental Status: She is alert and oriented to person, place, and time. Mental status is at baseline.  Psychiatric:        Mood and Affect: Mood normal.        Behavior: Behavior normal.        Thought Content: Thought content normal.        Judgment: Judgment normal.     Results for orders placed or performed in visit on 05/19/19  Bayer DCA Hb A1c Waived  Result Value Ref Range   HB A1C (BAYER DCA - WAIVED) 8.2 (H) <7.0 %  CBC with Differential/Platelet  Result Value Ref Range   WBC 6.8 3.4 - 10.8 x10E3/uL   RBC 3.77 3.77 - 5.28 x10E6/uL   Hemoglobin 10.6 (L) 11.1 - 15.9 g/dL   Hematocrit 32.1 (L) 34.0 - 46.6 %   MCV 85 79 - 97 fL   MCH 28.1 26.6 - 33.0 pg   MCHC 33.0  31.5 - 35.7 g/dL   RDW 13.4 11.7 - 15.4 %   Platelets 245 150 - 450 x10E3/uL   Neutrophils 65 Not Estab. %   Lymphs 28 Not Estab. %   Monocytes 6 Not Estab. %   Eos 1 Not Estab. %   Basos 0 Not Estab. %   Neutrophils Absolute 4.4 1.4 - 7.0 x10E3/uL   Lymphocytes Absolute 1.9 0.7 - 3.1 x10E3/uL   Monocytes Absolute 0.4 0.1 - 0.9 x10E3/uL   EOS (ABSOLUTE) 0.1 0.0 - 0.4 x10E3/uL   Basophils Absolute 0.0 0.0 - 0.2 x10E3/uL   Immature Granulocytes 0 Not Estab. %   Immature Grans (Abs) 0.0 0.0 - 0.1 x10E3/uL  Comprehensive metabolic panel  Result Value Ref Range   Glucose 142 (H) 65 - 99 mg/dL   BUN 26 8 - 27 mg/dL   Creatinine, Ser 1.22 (H) 0.57 - 1.00 mg/dL   GFR calc non Af Amer 44 (L) >59 mL/min/1.73   GFR calc Af Amer 51 (L) >59 mL/min/1.73   BUN/Creatinine Ratio 21 12 - 28   Sodium 139 134 - 144 mmol/L   Potassium 4.5 3.5 - 5.2 mmol/L   Chloride 104 96 - 106 mmol/L   CO2 22 20 - 29 mmol/L   Calcium 9.3 8.7 - 10.3 mg/dL   Total Protein 7.0 6.0 - 8.5 g/dL   Albumin 4.2 3.7 - 4.7 g/dL   Globulin, Total 2.8 1.5 - 4.5 g/dL   Albumin/Globulin Ratio 1.5 1.2 - 2.2   Bilirubin Total 0.4 0.0 - 1.2 mg/dL   Alkaline Phosphatase 98 39 - 117 IU/L   AST 12 0 - 40 IU/L   ALT 9 0 - 32 IU/L  Lipid Panel w/o Chol/HDL Ratio  Result Value Ref Range   Cholesterol, Total 155 100 - 199 mg/dL   Triglycerides 121 0 - 149 mg/dL   HDL 56 >39 mg/dL   VLDL Cholesterol Cal 21 5 - 40 mg/dL   LDL Chol Calc (NIH) 78 0 - 99 mg/dL  Microalbumin, Urine Waived  Result Value Ref Range   Microalb, Ur Waived 30 (H) 0 - 19 mg/L   Creatinine, Urine Waived 200 10 - 300 mg/dL   Microalb/Creat Ratio <30 <30 mg/g  TSH  Result Value Ref Range   TSH 2.760 0.450 - 4.500 uIU/mL      Assessment & Plan:   Problem  List Items Addressed This Visit      Endocrine   Diabetes (HCC)    Will stop glipizide and increase trulicity to 3mg  weekly. Recheck 1 month with A1c. Call with any concerns.       Relevant  Medications   Dulaglutide (TRULICITY) 3 MG/0.5ML SOPN       Follow up plan: Return in about 4 weeks (around 08/16/2019).

## 2019-07-19 NOTE — Assessment & Plan Note (Signed)
Will stop glipizide and increase trulicity to 3mg  weekly. Recheck 1 month with A1c. Call with any concerns.

## 2019-07-20 ENCOUNTER — Telehealth: Payer: Self-pay | Admitting: Family Medicine

## 2019-07-20 NOTE — Chronic Care Management (AMB) (Signed)
  Care Management   Note  07/20/2019 Name: Alison Hernandez MRN: [id number] DOB: 02/06/1946  Alison Hernandez is a 74 y.o. year old female who is a primary care patient of Dorcas Carrow, DO and is actively engaged with the care management team. I reached out to Caren Hazy by phone today to assist with re-scheduling a follow up visit with the Pharmacist  Follow up plan: Unsuccessful telephone outreach attempt made. A HIPPA compliant phone message was left for the patient providing contact information and requesting a return call.  The care management team will reach out to the patient again over the next 7 days.  If patient returns call to provider office, please advise to call Embedded Care Management Care Guide Penne Lash at [phone]  Penne Lash, RMA Care Guide, Embedded Care Coordination [facility]  [place], [place] [address] Direct Dial: [phone] [email or web address]: [email or web address]

## 2019-07-22 NOTE — Chronic Care Management (AMB) (Signed)
  Care Management   Note  07/22/2019 Name: Alison Hernandez MRN: [id number] DOB: 16-Feb-1946  Alison Hernandez is a 74 y.o. year old female who is a primary care patient of Dorcas Carrow, DO and is actively engaged with the care management team. I reached out to Caren Hazy by phone today to assist with re-scheduling a follow up visit with the Pharmacist  Follow up plan: Telephone appointment with care management team member scheduled for:09/21/2019  Penne Lash, RMA Care Guide, Embedded Care Coordination [facility]  [place], [place] [address] Direct Dial: [phone] [email or web address]

## 2019-09-21 ENCOUNTER — Ambulatory Visit (INDEPENDENT_AMBULATORY_CARE_PROVIDER_SITE_OTHER): Payer: Medicare Other | Admitting: Pharmacist

## 2019-09-21 DIAGNOSIS — I129 Hypertensive chronic kidney disease with stage 1 through stage 4 chronic kidney disease, or unspecified chronic kidney disease: Secondary | ICD-10-CM

## 2019-09-21 DIAGNOSIS — N182 Chronic kidney disease, stage 2 (mild): Secondary | ICD-10-CM | POA: Diagnosis not present

## 2019-09-21 DIAGNOSIS — E1122 Type 2 diabetes mellitus with diabetic chronic kidney disease: Secondary | ICD-10-CM | POA: Diagnosis not present

## 2019-09-21 DIAGNOSIS — E782 Mixed hyperlipidemia: Secondary | ICD-10-CM

## 2019-09-21 NOTE — Patient Instructions (Signed)
Visit Information  Goals Addressed              This Visit's Progress     Patient Stated   .  PharmD "I want to stay healthy (pt-stated)        CARE PLAN ENTRY (see longtitudinal plan of care for additional care plan information)  Current Barriers:  . Diabetes: uncontrolled, most recent A1c 8.2%  o Does report concerns with nausea throughout the week. Does note that she has reduced portion sizes and has lost ~16 lbs over the past few weeks. . Current antihyperglycemic regimen: metformin 2956 mg BID, Trulicity 3 mg weekly  . Most recent eGFR 44 mL/min o Hx Scr increase w/ Jardiance; eGFR currently appropriate for initiation, could consider re-initiation in the future, as we know have long term data supporting renal benefit (and that Scr bump w/ initiation is normal) o Hx rash w/ Bydureon . Denies any issues w/ hypoglycemia . Current glucose readings:  o Fasting: 180-200s; does report one reading of 79 o Afternoons: 150-180s . Cardiovascular risk reduction: o Current hypertensive regimen: lisinopril 20/25 mg; 2 tablets daily; BP at goal <130/80 o Current hyperlipidemia regimen: rosuvastatin 10 mg daily; LDL at goal ~70 . Depression: bupropion XL 300 mg daily  . Arthritis pain: reports worst in her hands. Tried diclofenac gel with no benefit. Not currently using any OTC pain relief.  Pharmacist Clinical Goal(s):  Marland Kitchen Over the next 90 days, patient will work with PharmD and primary care provider to address optimized medication management and medication access concerns  Interventions: . Comprehensive medication review performed, medication list updated in electronic medical record . Inter-disciplinary care team collaboration (see longitudinal plan of care) . Reviewed mechanism of GLP1 and why nausea could be related to medication. Could consider increasing Trulicity to 4.5 mg weekly if next A1c is not at goal, however, this could cause more nausea. Would recommend re-trial of SGLT2 w/  hydration, rather than restarting sulfonylurea or starting insulin. Encouraged to discuss next steps w/ PCP, pending next A1c . Counseled to avoid oral NSAIDs d/t CKD, but that acetaminophen is appropriate. Counseled on max dose 4000 mg daily, and that she can take 1-2 tab 2-3 times daily, depending on strength of acetaminophen. She verbalized understanding . Will collaborate w/ RN CM for continued support.  Patient Self Care Activities:  . Patient will check blood glucose daily , document, and provide at future appointments . Patient will take medications as prescribed . Patient will report any questions or concerns to provider   Please see past updates related to this goal by clicking on the "Past Updates" button in the selected goal         The patient verbalized understanding of instructions provided today and declined a print copy of patient instruction materials.   Plan:  - Patient has our contact information for future questions or concerns  Catie Darnelle Maffucci, PharmD, [place] [phone]

## 2019-09-21 NOTE — Chronic Care Management (AMB) (Signed)
Chronic Care Management   Follow Up Note   09/21/2019 Name: MALISA RUGGIERO MRN: [id number] DOB: 16-Apr-1945  Referred by: Valerie Roys, DO Reason for referral : Chronic Care Management (Medication Management)   REAGYN FACEMIRE is a 74 y.o. year old female who is a primary care patient of Valerie Roys, DO. The CCM team was consulted for assistance with chronic disease management and care coordination needs.    Contacted patient for medication management support.  Review of patient status, including review of consultants reports, relevant laboratory and other test results, and collaboration with appropriate care team members and the patient's provider was performed as part of comprehensive patient evaluation and provision of chronic care management services.    SDOH (Social Determinants of Health) assessments performed: No See Care Plan activities for detailed interventions related to [facility])     Outpatient Encounter Medications as of 09/21/2019  Medication Sig  . Blood Glucose Monitoring Suppl (ONE TOUCH ULTRA 2) w/Device KIT Test 3x a day  . buPROPion (WELLBUTRIN XL) 300 MG 24 hr tablet Take 1 tablet (300 mg total) by mouth daily.  . Dulaglutide (TRULICITY) 3 WU/9.8JX SOPN Inject 3 mg as directed once a week.  Marland Kitchen glucose blood test strip Use as instructed  . lisinopril-hydrochlorothiazide (ZESTORETIC) 20-25 MG tablet Take 2 tablets by mouth daily.  . meclizine (ANTIVERT) 25 MG tablet Take 1 tablet (25 mg total) by mouth 3 (three) times daily as needed for dizziness.  . metFORMIN (GLUCOPHAGE) 1000 MG tablet Take 1 tablet (1,000 mg total) by mouth 2 (two) times daily with a meal.  . Multiple Vitamin (MULTIVITAMIN) tablet Take 1 tablet by mouth daily.  . NYSTATIN powder APPLY  POWDER TOPICALLY 4 TIMES DAILY  . omeprazole (PRILOSEC) 20 MG capsule Take 2 capsules (40 mg total) by mouth daily.  . ondansetron (ZOFRAN-ODT) 8 MG disintegrating tablet DISSOLVE 1 TABLET IN MOUTH EVERY 8  HOURS AS NEEDED FOR NAUSEA OR VOMITING  . ONE TOUCH ULTRA TEST test strip Test 3x a day  . OneTouch Delica Lancets 91Y MISC Test 3x a day  . rosuvastatin (CRESTOR) 10 MG tablet Take 1 tablet (10 mg total) by mouth daily.  . [DISCONTINUED] glipiZIDE (GLUCOTROL) 5 MG tablet Take 5 mg by mouth daily before breakfast.  . diclofenac sodium (VOLTAREN) 1 % GEL Apply 4 g topically 4 (four) times daily. (Patient not taking: Reported on 09/21/2019)  . [DISCONTINUED] ferrous sulfate (FERROUSUL) 325 (65 FE) MG tablet Take 1 tablet (325 mg total) by mouth 2 (two) times daily with a meal. (Patient not taking: Reported on 09/21/2019)   No facility-administered encounter medications on file as of 09/21/2019.     Objective:   Goals Addressed              This Visit's Progress     Patient Stated   .  PharmD "I want to stay healthy (pt-stated)        CARE PLAN ENTRY (see longtitudinal plan of care for additional care plan information)  Current Barriers:  . Diabetes: uncontrolled, most recent A1c 8.2%  o Does report concerns with nausea throughout the week. Does note that she has reduced portion sizes and has lost ~16 lbs over the past few weeks. . Current antihyperglycemic regimen: metformin 7829 mg BID, Trulicity 3 mg weekly  . Most recent eGFR 44 mL/min o Hx Scr increase w/ Jardiance; eGFR currently appropriate for initiation, could consider re-initiation in the future, as we know have long term  data supporting renal benefit (and that Scr bump w/ initiation is normal) o Hx rash w/ Bydureon . Denies any issues w/ hypoglycemia . Current glucose readings:  o Fasting: 180-200s; does report one reading of 79 o Afternoons: 150-180s . Cardiovascular risk reduction: o Current hypertensive regimen: lisinopril 20/25 mg; 2 tablets daily; BP at goal <130/80 o Current hyperlipidemia regimen: rosuvastatin 10 mg daily; LDL at goal ~70 . Depression: bupropion XL 300 mg daily  . Arthritis pain: reports worst in  her hands. Tried diclofenac gel with no benefit. Not currently using any OTC pain relief.  Pharmacist Clinical Goal(s):  Marland Kitchen Over the next 90 days, patient will work with PharmD and primary care provider to address optimized medication management and medication access concerns  Interventions: . Comprehensive medication review performed, medication list updated in electronic medical record . Inter-disciplinary care team collaboration (see longitudinal plan of care) . Reviewed mechanism of GLP1 and why nausea could be related to medication. Could consider increasing Trulicity to 4.5 mg weekly if next A1c is not at goal, however, this could cause more nausea. Would recommend re-trial of SGLT2 w/ hydration, rather than restarting sulfonylurea or starting insulin. Encouraged to discuss next steps w/ PCP, pending next A1c . Counseled to avoid oral NSAIDs d/t CKD, but that acetaminophen is appropriate. Counseled on max dose 4000 mg daily, and that she can take 1-2 tab 2-3 times daily, depending on strength of acetaminophen. She verbalized understanding . Will collaborate w/ RN CM for continued support.  Patient Self Care Activities:  . Patient will check blood glucose daily , document, and provide at future appointments . Patient will take medications as prescribed . Patient will report any questions or concerns to provider   Please see past updates related to this goal by clicking on the "Past Updates" button in the selected goal          Plan:  - Patient has our contact information for future questions or concerns  Catie Darnelle Maffucci, PharmD, [place] [phone]

## 2019-10-18 ENCOUNTER — Other Ambulatory Visit: Payer: Self-pay

## 2019-10-18 ENCOUNTER — Ambulatory Visit (INDEPENDENT_AMBULATORY_CARE_PROVIDER_SITE_OTHER): Payer: Medicare Other | Admitting: Family Medicine

## 2019-10-18 ENCOUNTER — Encounter: Payer: Self-pay | Admitting: Family Medicine

## 2019-10-18 VITALS — BP 134/78 | HR 85 | Temp 98.4°F | Wt 172.4 lb

## 2019-10-18 DIAGNOSIS — E782 Mixed hyperlipidemia: Secondary | ICD-10-CM | POA: Diagnosis not present

## 2019-10-18 DIAGNOSIS — I129 Hypertensive chronic kidney disease with stage 1 through stage 4 chronic kidney disease, or unspecified chronic kidney disease: Secondary | ICD-10-CM | POA: Diagnosis not present

## 2019-10-18 DIAGNOSIS — D692 Other nonthrombocytopenic purpura: Secondary | ICD-10-CM | POA: Diagnosis not present

## 2019-10-18 DIAGNOSIS — F3341 Major depressive disorder, recurrent, in partial remission: Secondary | ICD-10-CM

## 2019-10-18 DIAGNOSIS — N182 Chronic kidney disease, stage 2 (mild): Secondary | ICD-10-CM | POA: Diagnosis not present

## 2019-10-18 DIAGNOSIS — E1122 Type 2 diabetes mellitus with diabetic chronic kidney disease: Secondary | ICD-10-CM

## 2019-10-18 LAB — BAYER DCA HB A1C WAIVED: HB A1C (BAYER DCA - WAIVED): 7.1 % — ABNORMAL HIGH (ref <7.0)

## 2019-10-18 MED ORDER — LISINOPRIL-HYDROCHLOROTHIAZIDE 20-25 MG PO TABS: 2.0000 | ORAL_TABLET | Freq: Every day | ORAL | 1 refills | Status: DC

## 2019-10-18 MED ORDER — BUPROPION HCL ER (XL) 300 MG PO TB24: 300.0000 mg | ORAL_TABLET | Freq: Every day | ORAL | 1 refills | Status: DC

## 2019-10-18 MED ORDER — OMEPRAZOLE 20 MG PO CPDR: 40.0000 mg | DELAYED_RELEASE_CAPSULE | Freq: Every day | ORAL | 1 refills | Status: DC

## 2019-10-18 MED ORDER — METFORMIN HCL 1000 MG PO TABS: 1000.0000 mg | ORAL_TABLET | Freq: Two times a day (BID) | ORAL | 1 refills | Status: DC

## 2019-10-18 MED ORDER — GLUCOSE BLOOD VI STRP: ORAL_STRIP | 12 refills | Status: DC

## 2019-10-18 MED ORDER — ONDANSETRON 8 MG PO TBDP: ORAL_TABLET | ORAL | 4 refills | Status: DC

## 2019-10-18 MED ORDER — TRULICITY 3 MG/0.5ML ~~LOC~~ SOAJ: 3.0000 mg | SUBCUTANEOUS | 3 refills | Status: DC

## 2019-10-18 MED ORDER — ROSUVASTATIN CALCIUM 10 MG PO TABS: 10.0000 mg | ORAL_TABLET | Freq: Every day | ORAL | 1 refills | Status: DC

## 2019-10-18 NOTE — Assessment & Plan Note (Signed)
Under good control on current regimen. Continue current regimen. Continue to monitor. Call with any concerns. Refills given. Labs drawn today.   

## 2019-10-18 NOTE — Assessment & Plan Note (Signed)
Doing well with A1c of 7.1. Continue current regimen. Continue to monitor. Call with any concerns.  

## 2019-10-18 NOTE — Progress Notes (Signed)
BP (!) 134/78 (BP Location: Left Arm, Patient Position: Sitting, Cuff Size: Normal)   Pulse 85   Temp 98.4 F (36.9 C) (Oral)   Wt 172 lb 6.4 oz (78.2 kg)   SpO2 99%   BMI 28.69 kg/m    Subjective:    Patient ID: Alison Hernandez, female    DOB: [birth year], 74 y.o.   MRN: [id number]  HPI: Alison Hernandez is a 74 y.o. female  Chief Complaint  Patient presents with  . Diabetes   DIABETES Hypoglycemic episodes:no Polydipsia/polyuria: no Visual disturbance: no Chest pain: no Paresthesias: no Glucose Monitoring: yes  Accucheck frequency: Daily  Fasting glucose: 180 Taking Insulin?: no Blood Pressure Monitoring: not checking Retinal Examination: Up to Date Foot Exam: Up to Date Diabetic Education: Completed Pneumovax: Up to Date Influenza: Up to Date Aspirin: yes  HYPERTENSION / HYPERLIPIDEMIA Satisfied with current treatment? yes Duration of hypertension: chronic BP monitoring frequency: not checking BP medication side effects: no Past BP meds: lisinopril-HCTZ Duration of hyperlipidemia: chronic Cholesterol medication side effects: no Cholesterol supplements: none Past cholesterol medications: crestor Medication compliance: excellent compliance Aspirin: yes Recent stressors: no Recurrent headaches: no Visual changes: no Palpitations: no Dyspnea: no Chest pain: no Lower extremity edema: no Dizzy/lightheaded: no   DEPRESSION Mood status: controlled Satisfied with current treatment?: yes Symptom severity: mild  Duration of current treatment : chronic Side effects: no Medication compliance: excellent compliance Psychotherapy/counseling: no  Previous psychiatric medications: wellbutrin Depressed mood: no Anxious mood: no Anhedonia: no Significant weight loss or gain: no Insomnia: no  Fatigue: no Feelings of worthlessness or guilt: no Impaired concentration/indecisiveness: no Suicidal ideations: no Hopelessness: no Crying spells: no Depression  screen [facility] 2/9 05/19/2019 11/18/2018 04/07/2018 11/18/2017 07/15/2017  Decreased Interest 0 - 0 0 0  Down, Depressed, Hopeless 0 0 0 0 0  PHQ - 2 Score 0 0 0 0 0  Altered sleeping 0 0 0 0 0  Tired, decreased energy 0 0 0 0 0  Change in appetite 0 0 0 0 0  Feeling bad or failure about yourself  0 0 0 0 0  Trouble concentrating 0 0 0 0 0  Moving slowly or fidgety/restless 0 0 0 0 0  Suicidal thoughts 0 0 0 0 0  PHQ-9 Score 0 0 0 0 0  Difficult doing work/chores Not difficult at all Not difficult at all Not difficult at all Not difficult at all Not difficult at all     Relevant past medical, surgical, family and social history reviewed and updated as indicated. Interim medical history since our last visit reviewed. Allergies and medications reviewed and updated.  Review of Systems  Constitutional: Negative.   Respiratory: Negative.   Cardiovascular: Negative.   Gastrointestinal: Positive for nausea. Negative for abdominal distention, abdominal pain, anal bleeding, blood in stool, constipation, diarrhea, rectal pain and vomiting.  Musculoskeletal: Negative.   Psychiatric/Behavioral: Negative.     Per HPI unless specifically indicated above     Objective:    BP (!) 134/78 (BP Location: Left Arm, Patient Position: Sitting, Cuff Size: Normal)   Pulse 85   Temp 98.4 F (36.9 C) (Oral)   Wt 172 lb 6.4 oz (78.2 kg)   SpO2 99%   BMI 28.69 kg/m   Wt Readings from Last 3 Encounters:  10/18/19 172 lb 6.4 oz (78.2 kg)  07/19/19 179 lb 12.8 oz (81.6 kg)  06/20/19 188 lb (85.3 kg)    Physical Exam Vitals and nursing note reviewed.  Constitutional:  General: She is not in acute distress.    Appearance: Normal appearance. She is not ill-appearing, toxic-appearing or diaphoretic.  HENT:     Head: Normocephalic and atraumatic.     Right Ear: External ear normal.     Left Ear: External ear normal.     Nose: Nose normal.     Mouth/Throat:     Mouth: Mucous membranes are moist.      Pharynx: Oropharynx is clear.  Eyes:     General: No scleral icterus.       Right eye: No discharge.        Left eye: No discharge.     Extraocular Movements: Extraocular movements intact.     Conjunctiva/sclera: Conjunctivae normal.     Pupils: Pupils are equal, round, and reactive to light.  Cardiovascular:     Rate and Rhythm: Normal rate and regular rhythm.     Pulses: Normal pulses.     Heart sounds: Normal heart sounds. No murmur heard.  No friction rub. No gallop.   Pulmonary:     Effort: Pulmonary effort is normal. No respiratory distress.     Breath sounds: Normal breath sounds. No stridor. No wheezing, rhonchi or rales.  Chest:     Chest wall: No tenderness.  Musculoskeletal:        General: Normal range of motion.     Cervical back: Normal range of motion and neck supple.  Skin:    General: Skin is warm and dry.     Capillary Refill: Capillary refill takes less than 2 seconds.     Coloration: Skin is not jaundiced or pale.     Findings: No bruising, erythema, lesion or rash.  Neurological:     General: No focal deficit present.     Mental Status: She is alert and oriented to person, place, and time. Mental status is at baseline.  Psychiatric:        Mood and Affect: Mood normal.        Behavior: Behavior normal.        Thought Content: Thought content normal.        Judgment: Judgment normal.     Results for orders placed or performed in visit on 05/19/19  Bayer DCA Hb A1c Waived  Result Value Ref Range   HB A1C (BAYER DCA - WAIVED) 8.2 (H) <7.0 %  CBC with Differential/Platelet  Result Value Ref Range   WBC 6.8 3.4 - 10.8 x10E3/uL   RBC 3.77 3.77 - 5.28 x10E6/uL   Hemoglobin 10.6 (L) 11.1 - 15.9 g/dL   Hematocrit 16.6 (L) 06.3 - 46.6 %   MCV 85 79 - 97 fL   MCH 28.1 26.6 - 33.0 pg   MCHC 33.0 31 - 35 g/dL   RDW 01.6 01.0 - 93.2 %   Platelets 245 150 - 450 x10E3/uL   Neutrophils 65 Not Estab. %   Lymphs 28 Not Estab. %   Monocytes 6 Not Estab. %   Eos  1 Not Estab. %   Basos 0 Not Estab. %   Neutrophils Absolute 4.4 1 - 7 x10E3/uL   Lymphocytes Absolute 1.9 0 - 3 x10E3/uL   Monocytes Absolute 0.4 0 - 0 x10E3/uL   EOS (ABSOLUTE) 0.1 0.0 - 0.4 x10E3/uL   Basophils Absolute 0.0 0 - 0 x10E3/uL   Immature Granulocytes 0 Not Estab. %   Immature Grans (Abs) 0.0 0.0 - 0.1 x10E3/uL  Comprehensive metabolic panel  Result Value Ref Range   Glucose  142 (H) 65 - 99 mg/dL   BUN 26 8 - 27 mg/dL   Creatinine, Ser 3.29 (H) 0.57 - 1.00 mg/dL   GFR calc non Af Amer 44 (L) >59 mL/min/1.73   GFR calc Af Amer 51 (L) >59 mL/min/1.73   BUN/Creatinine Ratio 21 12 - 28   Sodium 139 134 - 144 mmol/L   Potassium 4.5 3.5 - 5.2 mmol/L   Chloride 104 96 - 106 mmol/L   CO2 22 20 - 29 mmol/L   Calcium 9.3 8.7 - 10.3 mg/dL   Total Protein 7.0 6.0 - 8.5 g/dL   Albumin 4.2 3.7 - 4.7 g/dL   Globulin, Total 2.8 1.5 - 4.5 g/dL   Albumin/Globulin Ratio 1.5 1.2 - 2.2   Bilirubin Total 0.4 0.0 - 1.2 mg/dL   Alkaline Phosphatase 98 39 - 117 IU/L   AST 12 0 - 40 IU/L   ALT 9 0 - 32 IU/L  Lipid Panel w/o Chol/HDL Ratio  Result Value Ref Range   Cholesterol, Total 155 100 - 199 mg/dL   Triglycerides 518 0 - 149 mg/dL   HDL 56 >84 mg/dL   VLDL Cholesterol Cal 21 5 - 40 mg/dL   LDL Chol Calc (NIH) 78 0 - 99 mg/dL  Microalbumin, Urine Waived  Result Value Ref Range   Microalb, Ur Waived 30 (H) 0 - 19 mg/L   Creatinine, Urine Waived 200 10 - 300 mg/dL   Microalb/Creat Ratio <30 <30 mg/g  TSH  Result Value Ref Range   TSH 2.760 0.450 - 4.500 uIU/mL      Assessment & Plan:   Problem List Items Addressed This Visit      Cardiovascular and Mediastinum   Senile purpura (HCC)    Reassured patient. Continue to monitor.       Relevant Medications   lisinopril-hydrochlorothiazide (ZESTORETIC) 20-25 MG tablet   rosuvastatin (CRESTOR) 10 MG tablet     Endocrine   Diabetes (HCC) - Primary    Doing well with A1c of 7.1. Continue current regimen. Continue to  monitor. Call with any concerns.       Relevant Medications   Dulaglutide (TRULICITY) 3 MG/0.5ML SOPN   lisinopril-hydrochlorothiazide (ZESTORETIC) 20-25 MG tablet   metFORMIN (GLUCOPHAGE) 1000 MG tablet   rosuvastatin (CRESTOR) 10 MG tablet   Other Relevant Orders   Bayer DCA Hb A1c Waived   Comprehensive metabolic panel     Genitourinary   Benign hypertensive renal disease    Under good control on current regimen. Continue current regimen. Continue to monitor. Call with any concerns. Refills given. Labs drawn today.       Relevant Orders   Comprehensive metabolic panel     Other   Hyperlipidemia    Under good control on current regimen. Continue current regimen. Continue to monitor. Call with any concerns. Refills given. Labs drawn today.       Relevant Medications   lisinopril-hydrochlorothiazide (ZESTORETIC) 20-25 MG tablet   rosuvastatin (CRESTOR) 10 MG tablet   Other Relevant Orders   Comprehensive metabolic panel   Lipid Panel w/o Chol/HDL Ratio   Depression    Under good control on current regimen. Continue current regimen. Continue to monitor. Call with any concerns. Refills given. Labs drawn today.       Relevant Medications   buPROPion (WELLBUTRIN XL) 300 MG 24 hr tablet       Follow up plan: Return in about 3 months (around 01/18/2020).

## 2019-10-18 NOTE — Assessment & Plan Note (Signed)
Reassured patient. Continue to monitor.  

## 2019-10-19 LAB — LIPID PANEL W/O CHOL/HDL RATIO
Cholesterol, Total: 137 mg/dL (ref 100–199)
HDL: 47 mg/dL (ref >39)
LDL Chol Calc (NIH): 65 mg/dL (ref 0–99)
Triglycerides: 147 mg/dL (ref 0–149)
VLDL Cholesterol Cal: 25 mg/dL (ref 5–40)

## 2019-10-19 LAB — COMPREHENSIVE METABOLIC PANEL
ALT: 8 IU/L (ref 0–32)
AST: 12 IU/L (ref 0–40)
Albumin/Globulin Ratio: 1.8 (ref 1.2–2.2)
Albumin: 4.4 g/dL (ref 3.7–4.7)
Alkaline Phosphatase: 79 IU/L (ref 48–121)
BUN/Creatinine Ratio: 23 (ref 12–28)
BUN: 28 mg/dL — ABNORMAL HIGH (ref 8–27)
Bilirubin Total: 0.4 mg/dL (ref 0.0–1.2)
CO2: 23 mmol/L (ref 20–29)
Calcium: 9.3 mg/dL (ref 8.7–10.3)
Chloride: 101 mmol/L (ref 96–106)
Creatinine, Ser: 1.2 mg/dL — ABNORMAL HIGH (ref 0.57–1.00)
GFR calc Af Amer: 52 mL/min/{1.73_m2} — ABNORMAL LOW (ref >59)
GFR calc non Af Amer: 45 mL/min/{1.73_m2} — ABNORMAL LOW (ref >59)
Globulin, Total: 2.4 g/dL (ref 1.5–4.5)
Glucose: 138 mg/dL — ABNORMAL HIGH (ref 65–99)
Potassium: 4.5 mmol/L (ref 3.5–5.2)
Sodium: 136 mmol/L (ref 134–144)
Total Protein: 6.8 g/dL (ref 6.0–8.5)

## 2019-11-16 ENCOUNTER — Telehealth: Payer: Self-pay | Admitting: General Practice

## 2019-11-16 ENCOUNTER — Telehealth: Payer: Self-pay

## 2019-11-16 NOTE — Telephone Encounter (Signed)
  Chronic Care Management   Outreach Note  11/16/2019 Name: Alison Hernandez MRN: [id number] DOB: Dec 07, 1945  Referred by: Dorcas Carrow, DO Reason for referral : Appointment (RNCM Initial outreach. Referral from the pharmacist Catie for Chronic Disease Managment and Care Coordination Needs)   An unsuccessful telephone outreach was attempted today. The patient was referred to the case management team for assistance with care management and care coordination.   Follow Up Plan: A HIPPA compliant phone message was left for the patient providing contact information and requesting a return call.   Alto Denver RN, MSN, CCM Community Care Coordinator [place]  [facility] Mobile: [phone]

## 2019-11-21 ENCOUNTER — Telehealth: Payer: Self-pay

## 2019-11-21 ENCOUNTER — Ambulatory Visit (INDEPENDENT_AMBULATORY_CARE_PROVIDER_SITE_OTHER): Payer: Medicare Other

## 2019-11-21 VITALS — Ht 66.0 in | Wt 172.0 lb

## 2019-11-21 DIAGNOSIS — E1122 Type 2 diabetes mellitus with diabetic chronic kidney disease: Secondary | ICD-10-CM

## 2019-11-21 DIAGNOSIS — N182 Chronic kidney disease, stage 2 (mild): Secondary | ICD-10-CM | POA: Diagnosis not present

## 2019-11-21 DIAGNOSIS — Z0000 Encounter for general adult medical examination without abnormal findings: Secondary | ICD-10-CM

## 2019-11-21 NOTE — Chronic Care Management (AMB) (Signed)
  Care Management   Note  11/21/2019 Name: Alison Hernandez MRN: [id number] DOB: 1945-12-18  Alison Hernandez is a 74 y.o. year old female who is a primary care patient of Dorcas Carrow, DO and is actively engaged with the care management team. I reached out to Caren Hazy by phone today to assist with re-scheduling a follow up visit with the RN Case Manager, also scheduling referral for Pharm D   Follow up plan: Unsuccessful telephone outreach attempt made. A HIPPA compliant phone message was left for the patient providing contact information and requesting a return call.  The care management team will reach out to the patient again over the next 7 days.  If patient returns call to provider office, please advise to call Embedded Care Management Care Guide Penne Lash  at [phone]  Penne Lash, RMA Care Guide, Embedded Care Coordination [facility]  [place], [place] [address] Direct Dial: [phone] [email or web address]: [email or web address]

## 2019-11-21 NOTE — Progress Notes (Signed)
I connected with Alison Hernandez today by telephone and verified that I am speaking with the correct person using two identifiers. Location patient: home Location provider: work Persons participating in the virtual visit: Alison Hernandez, Alison Hernandez.   I discussed the limitations, risks, security and privacy concerns of performing an evaluation and management service by telephone and the availability of in person appointments. I also discussed with the patient that there may be a patient responsible charge related to this service. The patient expressed understanding and verbally consented to this telephonic visit.    Interactive audio and video telecommunications were attempted between this provider and patient, however failed, due to patient having technical difficulties OR patient did not have access to video capability.  We continued and completed visit with audio only.    Vital signs may be patient reported or missing.   Subjective:   Alison Hernandez is a 74 y.o. female who presents for Medicare Annual (Subsequent) preventive examination.  Review of Systems     Cardiac Risk Factors include: advanced age (>36mn, >>27women);diabetes mellitus;dyslipidemia;hypertension;sedentary lifestyle     Objective:    Today's Vitals   11/21/19 1031  Weight: 172 lb (78 kg)  Height: 5' 6"  (1.676 m)   Body mass index is 27.76 kg/m.  Advanced Directives 11/21/2019 11/06/2016 06/28/2016 03/26/2016  Does Patient Have a Medical Advance Directive? No No No No  Would patient like information on creating a medical advance directive? - Yes (MAU/Ambulatory/Procedural Areas - Information given) Yes (Inpatient - patient requests chaplain consult to create a medical advance directive) -    Current Medications (verified) Outpatient Encounter Medications as of 11/21/2019  Medication Sig  . Blood Glucose Monitoring Suppl (ONE TOUCH ULTRA 2) w/Device KIT Test 3x a day  . buPROPion (WELLBUTRIN XL) 300 MG 24  hr tablet Take 1 tablet (300 mg total) by mouth daily.  . Dulaglutide (TRULICITY) 3 MEX/9.3ZJSOPN Inject 0.5 mLs (3 mg total) as directed once a week.  .Marland Kitchenglucose blood test strip Use as instructed  . glucose blood test strip Use as instructed One touch ultra please  . lisinopril-hydrochlorothiazide (ZESTORETIC) 20-25 MG tablet Take 2 tablets by mouth daily.  . meclizine (ANTIVERT) 25 MG tablet Take 1 tablet (25 mg total) by mouth 3 (three) times daily as needed for dizziness.  . metFORMIN (GLUCOPHAGE) 1000 MG tablet Take 1 tablet (1,000 mg total) by mouth 2 (two) times daily with a meal.  . Multiple Vitamin (MULTIVITAMIN) tablet Take 1 tablet by mouth daily.  . NYSTATIN powder APPLY  POWDER TOPICALLY 4 TIMES DAILY  . omeprazole (PRILOSEC) 20 MG capsule Take 2 capsules (40 mg total) by mouth daily.  . ondansetron (ZOFRAN-ODT) 8 MG disintegrating tablet DISSOLVE 1 TABLET IN MOUTH EVERY 8 HOURS AS NEEDED FOR NAUSEA OR VOMITING  . ONE TOUCH ULTRA TEST test strip Test 3x a day  . OneTouch Delica Lancets 369CMISC Test 3x a day  . rosuvastatin (CRESTOR) 10 MG tablet Take 1 tablet (10 mg total) by mouth daily.  . diclofenac sodium (VOLTAREN) 1 % GEL Apply 4 g topically 4 (four) times daily. (Patient not taking: Reported on 09/21/2019)  . [DISCONTINUED] buPROPion (WELLBUTRIN XL) 300 MG 24 hr tablet Take 1 tablet (300 mg total) by mouth daily.  . [DISCONTINUED] Dulaglutide (TRULICITY) 07.89MFY/1.0FBSOPN Inject 0.75 mg into the skin once a week.  . [DISCONTINUED] ferrous sulfate (FERROUSUL) 325 (65 FE) MG tablet Take 1 tablet (325 mg total) by mouth 2 (two)  times daily with a meal. (Patient not taking: Reported on 09/21/2019)  . [DISCONTINUED] glipiZIDE (GLUCOTROL) 5 MG tablet TAKE 1 TABLET BY MOUTH ONCE DAILY BEFORE BREAKFAST  . [DISCONTINUED] lisinopril-hydrochlorothiazide (ZESTORETIC) 20-25 MG tablet Take 2 tablets by mouth daily.  . [DISCONTINUED] meclizine (ANTIVERT) 25 MG tablet Take 1 tablet (25 mg  total) by mouth 3 (three) times daily as needed for dizziness.  . [DISCONTINUED] metFORMIN (GLUCOPHAGE) 1000 MG tablet Take 1 tablet (1,000 mg total) by mouth 2 (two) times daily with a meal.  . [DISCONTINUED] nystatin (MYCOSTATIN/NYSTOP) powder Apply topically 4 (four) times daily.  . [DISCONTINUED] omeprazole (PRILOSEC) 20 MG capsule Take 2 capsules (40 mg total) by mouth daily.  . [DISCONTINUED] ondansetron (ZOFRAN-ODT) 8 MG disintegrating tablet Take 1 tablet (8 mg total) by mouth every 8 (eight) hours as needed for nausea or vomiting.  . [DISCONTINUED] rosuvastatin (CRESTOR) 10 MG tablet Take 1 tablet (10 mg total) by mouth daily.   No facility-administered encounter medications on file as of 11/21/2019.    Allergies (verified) Lovastatin and Bydureon [exenatide]   History: Past Medical History:  Diagnosis Date  . Diabetes mellitus without complication ([place])   . Hyperlipidemia   . Hypertension    Past Surgical History:  Procedure Laterality Date  . ABDOMINAL HYSTERECTOMY     total  . BREAST BIOPSY Left 20+ yrs ago   benign  . BREAST BIOPSY Left 20+ yrs ago   benign  . ROTATOR CUFF REPAIR Right 09/30/2016  . SIGMOIDOSCOPY  1990   Family History  Problem Relation Age of Onset  . Diabetes Mother   . Heart disease Mother   . Cancer Father        esophagus/ lung  . Diabetes Maternal Grandmother   . Cancer Sister        Ovarian   Social History   Socioeconomic History  . Marital status: Married    Spouse name: Not on file  . Number of children: Not on file  . Years of education: Not on file  . Highest education level: Not on file  Occupational History  . Not on file  Tobacco Use  . Smoking status: Never Smoker  . Smokeless tobacco: Never Used  Vaping Use  . Vaping Use: Never used  Substance and Sexual Activity  . Alcohol use: No  . Drug use: No  . Sexual activity: Not Currently    Birth control/protection: None  Other Topics Concern  . Not on file  Social  History Narrative  . Not on file   Social Determinants of Health   Financial Resource Strain: Medium Risk  . Difficulty of Paying Living Expenses: Somewhat hard  Food Insecurity: No Food Insecurity  . Worried About Charity fundraiser in the Last Year: Never true  . Ran Out of Food in the Last Year: Never true  Transportation Needs: No Transportation Needs  . Lack of Transportation (Medical): No  . Lack of Transportation (Non-Medical): No  Physical Activity: Inactive  . Days of Exercise per Week: 0 days  . Minutes of Exercise per Session: 0 min  Stress: No Stress Concern Present  . Feeling of Stress : Not at all  Social Connections:   . Frequency of Communication with Friends and Family: Not on file  . Frequency of Social Gatherings with Friends and Family: Not on file  . Attends Religious Services: Not on file  . Active Member of Clubs or Organizations: Not on file  . Attends Archivist  Meetings: Not on file  . Marital Status: Not on file    Tobacco Counseling Counseling given: Not Answered   Clinical Intake:  Pre-visit preparation completed: Yes  Pain : No/denies pain     Nutritional Status: BMI 25 -29 Overweight Nutritional Risks: None Diabetes: Yes  How often do you need to have someone help you when you read instructions, pamphlets, or other written materials from your doctor or pharmacy?: 1 - Never What is the last grade level you completed in school?: 12th grade  Diabetic? Yes Nutrition Risk Assessment:  Has the patient had any N/V/D within the last 2 months?  No  Does the patient have any non-healing wounds?  No  Has the patient had any unintentional weight loss or weight gain?  No   Diabetes:  Is the patient diabetic?  Yes  If diabetic, was a CBG obtained today?  No  Did the patient bring in their glucometer from home?  No  How often do you monitor your CBG's? Twice daily.   Financial Strains and Diabetes Management:  Are you having any  financial strains with the device, your supplies or your medication? Yes .  Does the patient want to be seen by Chronic Care Management for management of their diabetes?  No  Would the patient like to be referred to a Nutritionist or for Diabetic Management?  No   Diabetic Exams:  Diabetic Eye Exam: Overdue for diabetic eye exam. Pt has been advised about the importance in completing this exam. Patient advised to call and schedule an eye exam. Diabetic Foot Exam: Completed 10/18/2019   Interpreter Needed?: No  Information entered by :: NAllen Hernandez   Activities of Daily Living In your present state of health, do you have any difficulty performing the following activities: 11/21/2019  Hearing? N  Vision? N  Difficulty concentrating or making decisions? N  Walking or climbing stairs? N  Dressing or bathing? N  Doing errands, shopping? N  Preparing Food and eating ? N  Using the Toilet? N  In the past six months, have you accidently leaked urine? Y  Comment with coughing  Do you have problems with loss of bowel control? N  Managing your Medications? N  Managing your Finances? N  Housekeeping or managing your Housekeeping? N  Some recent data might be hidden    Patient Care Team: Valerie Roys, DO as PCP - General (Family Medicine) Tania Ade, MD as Consulting Physician (Orthopedic Surgery) Garrel Ridgel, DPM as Consulting Physician (Podiatry)  Indicate any recent Medical Services you may have received from other than [facility] providers in the past year (date may be approximate).     Assessment:   This is a routine wellness examination for [place].  Hearing/Vision screen  Hearing Screening   125Hz  250Hz  500Hz  1000Hz  2000Hz  3000Hz  4000Hz  6000Hz  8000Hz   Right ear:           Left ear:           Vision Screening Comments: No regular eye exams,   Dietary issues and exercise activities discussed: Current Exercise Habits: The patient does not participate in regular exercise at  present  Goals    .  Patient Stated      11/21/2019, wants to start walking when it gets cooler    .  PharmD "I want to stay healthy (pt-stated)      CARE PLAN ENTRY (see longtitudinal plan of care for additional care plan information)  Current Barriers:  . Diabetes:  uncontrolled, most recent A1c 8.2%  o Does report concerns with nausea throughout the week. Does note that she has reduced portion sizes and has lost ~16 lbs over the past few weeks. . Current antihyperglycemic regimen: metformin 4403 mg BID, Trulicity 3 mg weekly  . Most recent eGFR 44 mL/min o Hx Scr increase w/ Jardiance; eGFR currently appropriate for initiation, could consider re-initiation in the future, as we know have long term data supporting renal benefit (and that Scr bump w/ initiation is normal) o Hx rash w/ Bydureon . Denies any issues w/ hypoglycemia . Current glucose readings:  o Fasting: 180-200s; does report one reading of 79 o Afternoons: 150-180s . Cardiovascular risk reduction: o Current hypertensive regimen: lisinopril 20/25 mg; 2 tablets daily; BP at goal <130/80 o Current hyperlipidemia regimen: rosuvastatin 10 mg daily; LDL at goal ~70 . Depression: bupropion XL 300 mg daily  . Arthritis pain: reports worst in her hands. Tried diclofenac gel with no benefit. Not currently using any OTC pain relief.  Pharmacist Clinical Goal(s):  Marland Kitchen Over the next 90 days, patient will work with PharmD and primary care provider to address optimized medication management and medication access concerns  Interventions: . Comprehensive medication review performed, medication list updated in electronic medical record . Inter-disciplinary care team collaboration (see longitudinal plan of care) . Reviewed mechanism of GLP1 and why nausea could be related to medication. Could consider increasing Trulicity to 4.5 mg weekly if next A1c is not at goal, however, this could cause more nausea. Would recommend re-trial of SGLT2 w/  hydration, rather than restarting sulfonylurea or starting insulin. Encouraged to discuss next steps w/ PCP, pending next A1c . Counseled to avoid oral NSAIDs d/t CKD, but that acetaminophen is appropriate. Counseled on max dose 4000 mg daily, and that she can take 1-2 tab 2-3 times daily, depending on strength of acetaminophen. She verbalized understanding . Will collaborate w/ RN CM for continued support.  Patient Self Care Activities:  . Patient will check blood glucose daily , document, and provide at future appointments . Patient will take medications as prescribed . Patient will report any questions or concerns to provider   Please see past updates related to this goal by clicking on the "Past Updates" button in the selected goal        Depression Screen PHQ 2/9 Scores 11/21/2019 10/18/2019 05/19/2019 11/18/2018 04/07/2018 11/18/2017 07/15/2017  PHQ - 2 Score 0 0 0 0 0 0 0  PHQ- 9 Score - 0 0 0 0 0 0    Fall Risk Fall Risk  11/21/2019 11/18/2018 07/15/2017 04/08/2017 11/06/2016  Falls in the past year? 0 0 Yes Yes Yes  Number falls in past yr: - 0 1 1 1   Injury with Fall? - 0 Yes Yes Yes  Risk Factor Category  - - - (No Data) -  Comment - - - Accident- not high fall risk -  Risk for fall due to : Medication side effect - Impaired balance/gait - -  Follow up Falls evaluation completed;Education provided;Falls prevention discussed - Falls evaluation completed;Falls prevention discussed;Education provided Education provided Falls prevention discussed    Any stairs in or around the home? Yes  If so, are there any without handrails? Yes  Home free of loose throw rugs in walkways, pet beds, electrical cords, etc? Yes  Adequate lighting in your home to reduce risk of falls? Yes   ASSISTIVE DEVICES UTILIZED TO PREVENT FALLS:  Life alert? No  Use of a cane, walker  or w/c? No  Grab bars in the bathroom? Yes  Shower chair or bench in shower? Yes  Elevated toilet seat or a handicapped toilet?  No   TIMED UP AND GO:  Was the test performed? No ..   Cognitive Function:     6CIT Screen 11/21/2019 11/18/2018 11/06/2016  What Year? 0 points 0 points 0 points  What month? 0 points 0 points 0 points  What time? 0 points 0 points 0 points  Count back from 20 0 points 0 points 0 points  Months in reverse 0 points 0 points 0 points  Repeat phrase 0 points 0 points 0 points  Total Score 0 0 0    Immunizations Immunization History  Administered Date(s) Administered  . Fluad Quad(high Dose 65+) 11/18/2018  . Influenza, High Dose Seasonal PF 11/28/2015, 04/08/2017, 04/07/2018  . Influenza,inj,Quad PF,6+ Mos 03/28/2015  . PFIZER SARS-COV-2 Vaccination 06/02/2019, 06/28/2019  . Pneumococcal Conjugate-13 08/29/2015  . Pneumococcal Polysaccharide-23 03/24/1997, 11/06/2016  . Td 05/12/2008, 11/18/2018    TDAP status: Up to date Flu Vaccine status: Up to date Pneumococcal vaccine status: Up to date Covid-19 vaccine status: Completed vaccines  Qualifies for Shingles Vaccine? Yes   Zostavax completed No   Shingrix Completed?: No.    Education has been provided regarding the importance of this vaccine. Patient has been advised to call insurance company to determine out of pocket expense if they have not yet received this vaccine. Advised may also receive vaccine at local pharmacy or Health Dept. Verbalized acceptance and understanding.  Screening Tests Health Maintenance  Topic Date Due  . OPHTHALMOLOGY EXAM  Never done  . DEXA SCAN  Never done  . MAMMOGRAM  09/19/2016  . INFLUENZA VACCINE  10/23/2019  . COLONOSCOPY  12/31/2019 (Originally 04/02/1997)  . HEMOGLOBIN A1C  04/19/2020  . FOOT EXAM  10/17/2020  . TETANUS/TDAP  11/17/2028  . COVID-19 Vaccine  Completed  . Hepatitis C Screening  Completed  . PNA vac Low Risk Adult  Completed    Health Maintenance  Health Maintenance Due  Topic Date Due  . OPHTHALMOLOGY EXAM  Never done  . DEXA SCAN  Never done  . MAMMOGRAM   09/19/2016  . INFLUENZA VACCINE  10/23/2019    Colorectal cancer screening: Has cologuard to send out Mammogram status: Patient to schedule Bone Density status: Due  Lung Cancer Screening: (Low Dose CT Chest recommended if Age 36-80 years, 30 pack-year currently smoking OR have quit w/in 15years.) does not qualify.   Lung Cancer Screening Referral: no  Additional Screening:  Hepatitis C Screening: does qualify; Completed 03/28/2015  Vision Screening: Recommended annual ophthalmology exams for early detection of glaucoma and other disorders of the eye. Is the patient up to date with their annual eye exam?  No  Who is the provider or what is the name of the office in which the patient attends annual eye exams? none If pt is not established with a provider, would they like to be referred to a provider to establish care? No .   Dental Screening: Recommended annual dental exams for proper oral hygiene  Community Resource Referral / Chronic Care Management: CRR required this visit?  No   CCM required this visit?  Yes      Plan:     I have personally reviewed and noted the following in the patient's chart:   . Medical and social history . Use of alcohol, tobacco or illicit drugs  . Current medications and supplements . Functional  ability and status . Nutritional status . Physical activity . Advanced directives . List of other physicians . Hospitalizations, surgeries, and ER visits in previous 12 months . Vitals . Screenings to include cognitive, depression, and falls . Referrals and appointments  In addition, I have reviewed and discussed with patient certain preventive protocols, quality metrics, and best practice recommendations. A written personalized care plan for preventive services as well as general preventive health recommendations were provided to patient.     Kellie Simmering, Hernandez   9/31/0914   Hernandez Notes:

## 2019-11-21 NOTE — Patient Instructions (Signed)
Alison Hernandez , Thank you for taking time to come for your Medicare Wellness Visit. I appreciate your ongoing commitment to your health goals. Please review the following plan we discussed and let me know if I can assist you in the future.   Screening recommendations/referrals: Colonoscopy: has cologuard to send in Mammogram: patient to schedule Bone Density: due Recommended yearly ophthalmology/optometry visit for glaucoma screening and checkup Recommended yearly dental visit for hygiene and checkup  Vaccinations: Influenza vaccine: due Pneumococcal vaccine: completed 11/06/2016 Tdap vaccine: completed 11/18/2018 Shingles vaccine: discussed   Covid-19: 06/28/2019, 06/02/2019  Advanced directives: Advance directive discussed with you today.    Conditions/risks identified: none  Next appointment: Follow up in one year for your annual wellness visit    Preventive Care 65 Years and Older, Female Preventive care refers to lifestyle choices and visits with your health care provider that can promote health and wellness. What does preventive care include?  A yearly physical exam. This is also called an annual well check.  Dental exams once or twice a year.  Routine eye exams. Ask your health care provider how often you should have your eyes checked.  Personal lifestyle choices, including:  Daily care of your teeth and gums.  Regular physical activity.  Eating a healthy diet.  Avoiding tobacco and drug use.  Limiting alcohol use.  Practicing safe sex.  Taking low-dose aspirin every day.  Taking vitamin and mineral supplements as recommended by your health care provider. What happens during an annual well check? The services and screenings done by your health care provider during your annual well check will depend on your age, overall health, lifestyle risk factors, and family history of disease. Counseling  Your health care provider may ask you questions about your:  Alcohol  use.  Tobacco use.  Drug use.  Emotional well-being.  Home and relationship well-being.  Sexual activity.  Eating habits.  History of falls.  Memory and ability to understand (cognition).  Work and work Astronomer.  Reproductive health. Screening  You may have the following tests or measurements:  Height, weight, and BMI.  Blood pressure.  Lipid and cholesterol levels. These may be checked every 5 years, or more frequently if you are over 32 years old.  Skin check.  Lung cancer screening. You may have this screening every year starting at age 73 if you have a 30-pack-year history of smoking and currently smoke or have quit within the past 15 years.  Fecal occult blood test (FOBT) of the stool. You may have this test every year starting at age 72.  Flexible sigmoidoscopy or colonoscopy. You may have a sigmoidoscopy every 5 years or a colonoscopy every 10 years starting at age 30.  Hepatitis C blood test.  Hepatitis B blood test.  Sexually transmitted disease (STD) testing.  Diabetes screening. This is done by checking your blood sugar (glucose) after you have not eaten for a while (fasting). You may have this done every 1-3 years.  Bone density scan. This is done to screen for osteoporosis. You may have this done starting at age 18.  Mammogram. This may be done every 1-2 years. Talk to your health care provider about how often you should have regular mammograms. Talk with your health care provider about your test results, treatment options, and if necessary, the need for more tests. Vaccines  Your health care provider may recommend certain vaccines, such as:  Influenza vaccine. This is recommended every year.  Tetanus, diphtheria, and acellular pertussis (Tdap,  Td) vaccine. You may need a Td booster every 10 years.  Zoster vaccine. You may need this after age 14.  Pneumococcal 13-valent conjugate (PCV13) vaccine. One dose is recommended after age  70.  Pneumococcal polysaccharide (PPSV23) vaccine. One dose is recommended after age 71. Talk to your health care provider about which screenings and vaccines you need and how often you need them. This information is not intended to replace advice given to you by your health care provider. Make sure you discuss any questions you have with your health care provider. Document Released: 04/06/2015 Document Revised: 11/28/2015 Document Reviewed: 01/09/2015 Elsevier Interactive Patient Education  2017 [place] Prevention in the Home Falls can cause injuries. They can happen to people of all ages. There are many things you can do to make your home safe and to help prevent falls. What can I do on the outside of my home?  Regularly fix the edges of walkways and driveways and fix any cracks.  Remove anything that might make you trip as you walk through a door, such as a raised step or threshold.  Trim any bushes or trees on the path to your home.  Use bright outdoor lighting.  Clear any walking paths of anything that might make someone trip, such as rocks or tools.  Regularly check to see if handrails are loose or broken. Make sure that both sides of any steps have handrails.  Any raised decks and porches should have guardrails on the edges.  Have any leaves, snow, or ice cleared regularly.  Use sand or salt on walking paths during winter.  Clean up any spills in your garage right away. This includes oil or grease spills. What can I do in the bathroom?  Use night lights.  Install grab bars by the toilet and in the tub and shower. Do not use towel bars as grab bars.  Use non-skid mats or decals in the tub or shower.  If you need to sit down in the shower, use a plastic, non-slip stool.  Keep the floor dry. Clean up any water that spills on the floor as soon as it happens.  Remove soap buildup in the tub or shower regularly.  Attach bath mats securely with double-sided  non-slip rug tape.  Do not have throw rugs and other things on the floor that can make you trip. What can I do in the bedroom?  Use night lights.  Make sure that you have a light by your bed that is easy to reach.  Do not use any sheets or blankets that are too big for your bed. They should not hang down onto the floor.  Have a firm chair that has side arms. You can use this for support while you get dressed.  Do not have throw rugs and other things on the floor that can make you trip. What can I do in the kitchen?  Clean up any spills right away.  Avoid walking on wet floors.  Keep items that you use a lot in easy-to-reach places.  If you need to reach something above you, use a strong step stool that has a grab bar.  Keep electrical cords out of the way.  Do not use floor polish or wax that makes floors slippery. If you must use wax, use non-skid floor wax.  Do not have throw rugs and other things on the floor that can make you trip. What can I do with my stairs?  Do not  leave any items on the stairs.  Make sure that there are handrails on both sides of the stairs and use them. Fix handrails that are broken or loose. Make sure that handrails are as long as the stairways.  Check any carpeting to make sure that it is firmly attached to the stairs. Fix any carpet that is loose or worn.  Avoid having throw rugs at the top or bottom of the stairs. If you do have throw rugs, attach them to the floor with carpet tape.  Make sure that you have a light switch at the top of the stairs and the bottom of the stairs. If you do not have them, ask someone to add them for you. What else can I do to help prevent falls?  Wear shoes that:  Do not have high heels.  Have rubber bottoms.  Are comfortable and fit you well.  Are closed at the toe. Do not wear sandals.  If you use a stepladder:  Make sure that it is fully opened. Do not climb a closed stepladder.  Make sure that both  sides of the stepladder are locked into place.  Ask someone to hold it for you, if possible.  Clearly mark and make sure that you can see:  Any grab bars or handrails.  First and last steps.  Where the edge of each step is.  Use tools that help you move around (mobility aids) if they are needed. These include:  Canes.  Walkers.  Scooters.  Crutches.  Turn on the lights when you go into a dark area. Replace any light bulbs as soon as they burn out.  Set up your furniture so you have a clear path. Avoid moving your furniture around.  If any of your floors are uneven, fix them.  If there are any pets around you, be aware of where they are.  Review your medicines with your doctor. Some medicines can make you feel dizzy. This can increase your chance of falling. Ask your doctor what other things that you can do to help prevent falls. This information is not intended to replace advice given to you by your health care provider. Make sure you discuss any questions you have with your health care provider. Document Released: 01/04/2009 Document Revised: 08/16/2015 Document Reviewed: 04/14/2014 Elsevier Interactive Patient Education  2017 [employer].

## 2019-11-23 NOTE — Chronic Care Management (AMB) (Signed)
  Care Management   Note  11/23/2019 Name: Alison Hernandez MRN: [id number] DOB: 05/23/1945  Alison Hernandez is a 74 y.o. year old female who is a primary care patient of Alison Carrow, DO and is actively engaged with the care management team. I reached out to Alison Hernandez by phone today to assist with re-scheduling a follow up visit with the RN Case Manager also scheduled Referral for Pharm D   Follow up plan: Telephone appointment with care management team member scheduled for:RN CM 01/10/2020 Pharm D referral 12/26/2019  Alison Hernandez, RMA Care Guide, Embedded Care Coordination [facility]  [place], [place] [address] Direct Dial: [phone] [email or web address]

## 2019-11-23 NOTE — Telephone Encounter (Signed)
Pt has been r/s for 01/10/2020

## 2019-12-26 ENCOUNTER — Ambulatory Visit: Payer: Medicare Other | Admitting: Pharmacist

## 2019-12-26 DIAGNOSIS — I129 Hypertensive chronic kidney disease with stage 1 through stage 4 chronic kidney disease, or unspecified chronic kidney disease: Secondary | ICD-10-CM

## 2019-12-26 DIAGNOSIS — E1122 Type 2 diabetes mellitus with diabetic chronic kidney disease: Secondary | ICD-10-CM

## 2019-12-28 ENCOUNTER — Telehealth: Payer: Self-pay | Admitting: Pharmacist

## 2019-12-28 NOTE — Progress Notes (Signed)
    Chronic Care Management Pharmacy Assistant   Name: Alison Hernandez  MRN: [id number] DOB: 11/01/1945  Reason for Encounter: Medication Review   PCP : Valerie Roys, DO  Allergies:   Allergies  Allergen Reactions  . Lovastatin Nausea And Vomiting  . Bydureon [Exenatide] Rash    Medications: Outpatient Encounter Medications as of 12/28/2019  Medication Sig  . Blood Glucose Monitoring Suppl (ONE TOUCH ULTRA 2) w/Device KIT Test 3x a day  . buPROPion (WELLBUTRIN XL) 300 MG 24 hr tablet Take 1 tablet (300 mg total) by mouth daily.  . diclofenac sodium (VOLTAREN) 1 % GEL Apply 4 g topically 4 (four) times daily. (Patient not taking: Reported on 09/21/2019)  . Dulaglutide (TRULICITY) 3 JQ/7.3AL SOPN Inject 0.5 mLs (3 mg total) as directed once a week.  Marland Kitchen glucose blood test strip Use as instructed  . glucose blood test strip Use as instructed One touch ultra please  . lisinopril-hydrochlorothiazide (ZESTORETIC) 20-25 MG tablet Take 2 tablets by mouth daily.  . meclizine (ANTIVERT) 25 MG tablet Take 1 tablet (25 mg total) by mouth 3 (three) times daily as needed for dizziness.  . metFORMIN (GLUCOPHAGE) 1000 MG tablet Take 1 tablet (1,000 mg total) by mouth 2 (two) times daily with a meal.  . Multiple Vitamin (MULTIVITAMIN) tablet Take 1 tablet by mouth daily.  . NYSTATIN powder APPLY  POWDER TOPICALLY 4 TIMES DAILY  . omeprazole (PRILOSEC) 20 MG capsule Take 2 capsules (40 mg total) by mouth daily.  . ondansetron (ZOFRAN-ODT) 8 MG disintegrating tablet DISSOLVE 1 TABLET IN MOUTH EVERY 8 HOURS AS NEEDED FOR NAUSEA OR VOMITING  . ONE TOUCH ULTRA TEST test strip Test 3x a day  . OneTouch Delica Lancets 93X MISC Test 3x a day  . rosuvastatin (CRESTOR) 10 MG tablet Take 1 tablet (10 mg total) by mouth daily.   No facility-administered encounter medications on file as of 12/28/2019.    Current Diagnosis: Patient Active Problem List   Diagnosis Date Noted  . Senile purpura ([place])  04/07/2018  . Swelling of lower limb 04/02/2016  . Anemia 08/30/2015  . Diabetes ([place]) 03/28/2015  . 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 Hyperlipidemia 03/28/2015  . Depression 03/28/2015    12/28/19-Form filled for [employer] Patient Assistance Program for Trulicity $RemoveBefor'3mg'DosnSUFdsWhy$ . Attempted to call patient's daughter Anderson Malta) to notify her to expect Ms. Polyakov's PAP for Rx Trulicity sent via mail for patient to sign and attach proof of income and copy of insurance card. No answer; however lvm with a call back number for any questions she may have and assisting with application. Birdena Crandall, CPP notified.  Follow-Up:  Patient Assistance Coordination and Pharmacist Review- Patient needs to have PAP with [employer] for Trulicity due to low hand supply of pens.   Raynelle Highland, [place] Assistant [phone]

## 2020-01-01 NOTE — Patient Instructions (Signed)
Visit Information  It was a pleasure speaking with you today. Thank you for letting me be part of your clinical team. Please call with any questions or concerns.   Goals Addressed              This Visit's Progress   .  PharmD "I want to stay healthy (pt-stated)        CARE PLAN ENTRY (see longtitudinal plan of care for additional care plan information)  Current Barriers:  . Diabetes: controlled, most recent A1c 7.1%  o Does report concerns with nausea throughout the week. Does note that she has reduced portion sizes and has lost ~26 lbs. . Current antihyperglycemic regimen: metformin 0919 mg BID, Trulicity 3 mg weekly. Her main concern today is the increased cost of her Trulicity to $802/CHTVG. Pharmacy team will initiate PAP process. Patient reports having 2 pens remaining. . Most recent eGFR 44 mL/min o Hx Scr increase w/ Jardiance; eGFR (no need for restart at this times as a1c is controlled at 7.1) o Hx rash w/ Bydureon . Denies any issues w/ hypoglycemia . Current glucose readings:  o Fasting: 180-200s;  Marland Kitchen Cardiovascular risk reduction: o Current hypertensive regimen: lisinopril 20/25 mg; 2 tablets daily; BP at goal <130/80 o Current hyperlipidemia regimen: rosuvastatin 10 mg daily; LDL at goal ~70 . Depression: bupropion XL 300 mg daily  . Arthritis pain: reports worst in her hands. Tried diclofenac gel with no benefit. Not currently using any OTC pain relief.  Pharmacist Clinical Goal(s):  Marland Kitchen Over the next 90 days, patient will work with PharmD and primary care provider to address optimized medication management and medication access concerns  Interventions: . Comprehensive medication review performed, medication list updated in electronic medical record . Inter-disciplinary care team collaboration (see longitudinal plan of care) . Will initiate PAP paperwork for Trulicity. . Will collaborate w/ RN CM for continued support.  Patient Self Care Activities:  . Patient  will check blood glucose daily , document, and provide at future appointments . Patient will take medications as prescribed . Patient to complete required portion of PAP application and return . Patient will report any questions or concerns to provider   Please see past updates related to this goal by clicking on the "Past Updates" button in the selected goal         The patient verbalized understanding of instructions provided today and agreed to receive a mailed copy of patient instruction and/or educational materials.  Telephone follow up appointment with pharmacy team member scheduled for: 3 months  Junita Push. Kenton Kingfisher PharmD, [place] Clinical Pharmacist [phone]

## 2020-01-01 NOTE — Chronic Care Management (AMB) (Signed)
Chronic Care Management   Follow Up Note   01/01/2020 Name: Alison Hernandez MRN: [id number] DOB: 1945/04/16  Referred by: Valerie Roys, DO Reason for referral : No chief complaint on file.   Alison Hernandez is a 74 y.o. year old female who is a primary care patient of Valerie Roys, DO. The CCM team was consulted for assistance with chronic disease management and care coordination needs.    Review of patient status, including review of consultants reports, relevant laboratory and other test results, and collaboration with appropriate care team members and the patient's provider was performed as part of comprehensive patient evaluation and provision of chronic care management services.    SDOH (Social Determinants of Health) assessments performed: Yes See Care Plan activities for detailed interventions related to [facility])     Outpatient Encounter Medications as of 12/26/2019  Medication Sig  . buPROPion (WELLBUTRIN XL) 300 MG 24 hr tablet Take 1 tablet (300 mg total) by mouth daily.  . Dulaglutide (TRULICITY) 3 HM/0.9OB SOPN Inject 0.5 mLs (3 mg total) as directed once a week.  Marland Kitchen glucose blood test strip Use as instructed  . glucose blood test strip Use as instructed One touch ultra please  . Blood Glucose Monitoring Suppl (ONE TOUCH ULTRA 2) w/Device KIT Test 3x a day  . diclofenac sodium (VOLTAREN) 1 % GEL Apply 4 g topically 4 (four) times daily. (Patient not taking: Reported on 09/21/2019)  . lisinopril-hydrochlorothiazide (ZESTORETIC) 20-25 MG tablet Take 2 tablets by mouth daily.  . meclizine (ANTIVERT) 25 MG tablet Take 1 tablet (25 mg total) by mouth 3 (three) times daily as needed for dizziness.  . metFORMIN (GLUCOPHAGE) 1000 MG tablet Take 1 tablet (1,000 mg total) by mouth 2 (two) times daily with a meal.  . Multiple Vitamin (MULTIVITAMIN) tablet Take 1 tablet by mouth daily.  . NYSTATIN powder APPLY  POWDER TOPICALLY 4 TIMES DAILY  . omeprazole (PRILOSEC) 20 MG capsule  Take 2 capsules (40 mg total) by mouth daily.  . ondansetron (ZOFRAN-ODT) 8 MG disintegrating tablet DISSOLVE 1 TABLET IN MOUTH EVERY 8 HOURS AS NEEDED FOR NAUSEA OR VOMITING  . ONE TOUCH ULTRA TEST test strip Test 3x a day  . OneTouch Delica Lancets 09G MISC Test 3x a day  . rosuvastatin (CRESTOR) 10 MG tablet Take 1 tablet (10 mg total) by mouth daily.   No facility-administered encounter medications on file as of 12/26/2019.     Objective:   Goals Addressed              This Visit's Progress   .  PharmD "I want to stay healthy (pt-stated)        CARE PLAN ENTRY (see longtitudinal plan of care for additional care plan information)  Current Barriers:  . Diabetes: controlled, most recent A1c 7.1%  o Does report concerns with nausea throughout the week. Does note that she has reduced portion sizes and has lost ~26 lbs. . Current antihyperglycemic regimen: metformin 2836 mg BID, Trulicity 3 mg weekly. Her main concern today is the increased cost of her Trulicity to $629/UTMLY. Pharmacy team will initiate PAP process. Patient reports having 2 pens remaining. . Most recent eGFR 44 mL/min o Hx Scr increase w/ Jardiance; eGFR (no need for restart at this times as a1c is controlled at 7.1) o Hx rash w/ Bydureon . Denies any issues w/ hypoglycemia . Current glucose readings:  o Fasting: 180-200s;  Marland Kitchen Cardiovascular risk reduction: o Current hypertensive regimen: lisinopril  20/25 mg; 2 tablets daily; BP at goal <130/80 o Current hyperlipidemia regimen: rosuvastatin 10 mg daily; LDL at goal ~70 . Depression: bupropion XL 300 mg daily  . Arthritis pain: reports worst in her hands. Tried diclofenac gel with no benefit. Not currently using any OTC pain relief.  Pharmacist Clinical Goal(s):  Marland Kitchen Over the next 90 days, patient will work with PharmD and primary care provider to address optimized medication management and medication access concerns  Interventions: . Comprehensive medication  review performed, medication list updated in electronic medical record . Inter-disciplinary care team collaboration (see longitudinal plan of care) . Will initiate PAP paperwork for Trulicity. . Will collaborate w/ RN CM for continued support.  Patient Self Care Activities:  . Patient will check blood glucose daily , document, and provide at future appointments . Patient will take medications as prescribed . Patient to complete required portion of PAP application and return . Patient will report any questions or concerns to provider   Please see past updates related to this goal by clicking on the "Past Updates" button in the selected goal          Plan:  Pharmacy team will initiate PAP for Trulicity as $833 monthly copayment is cost prohibitive.  PharmD will follow up appointment in 3 months.  Junita Push. Kenton Kingfisher PharmD, [facility] [phone]

## 2020-01-10 ENCOUNTER — Telehealth: Payer: Self-pay | Admitting: General Practice

## 2020-01-10 ENCOUNTER — Telehealth: Payer: Medicare Other

## 2020-01-10 NOTE — Telephone Encounter (Signed)
°  Chronic Care Management   Outreach Note  01/10/2020 Name: Alison Hernandez MRN: [id number] DOB: 07-Apr-1945  Referred by: Dorcas Carrow, DO Reason for referral : Chronic Care Management (RNCM Chronic Disease Management and Care Coordination Needs )   An unsuccessful telephone outreach was attempted today. The patient was referred to the case management team for assistance with care management and care coordination.   Follow Up Plan: A HIPAA compliant phone message was left for the patient providing contact information and requesting a return call.   Alto Denver RN, MSN, CCM Community Care Coordinator [place]   [facility] Mobile: [phone]

## 2020-01-17 ENCOUNTER — Other Ambulatory Visit: Payer: Self-pay

## 2020-01-17 ENCOUNTER — Telehealth: Payer: Self-pay | Admitting: Pharmacist

## 2020-01-17 ENCOUNTER — Ambulatory Visit (INDEPENDENT_AMBULATORY_CARE_PROVIDER_SITE_OTHER): Payer: Medicare Other | Admitting: Family Medicine

## 2020-01-17 ENCOUNTER — Encounter: Payer: Self-pay | Admitting: Family Medicine

## 2020-01-17 VITALS — BP 126/94 | HR 94 | Temp 98.7°F | Ht 66.0 in | Wt 167.0 lb

## 2020-01-17 DIAGNOSIS — E1122 Type 2 diabetes mellitus with diabetic chronic kidney disease: Secondary | ICD-10-CM | POA: Diagnosis not present

## 2020-01-17 DIAGNOSIS — Z23 Encounter for immunization: Secondary | ICD-10-CM

## 2020-01-17 DIAGNOSIS — N182 Chronic kidney disease, stage 2 (mild): Secondary | ICD-10-CM

## 2020-01-17 DIAGNOSIS — Z1231 Encounter for screening mammogram for malignant neoplasm of breast: Secondary | ICD-10-CM | POA: Diagnosis not present

## 2020-01-17 LAB — BAYER DCA HB A1C WAIVED: HB A1C (BAYER DCA - WAIVED): 7.4 % — ABNORMAL HIGH (ref <7.0)

## 2020-01-17 MED ORDER — ONDANSETRON 8 MG PO TBDP
ORAL_TABLET | ORAL | 4 refills | Status: DC
Start: 2020-01-17 — End: 2021-10-18

## 2020-01-17 NOTE — Progress Notes (Signed)
    Chronic Care Management Pharmacy Assistant   Name: Alison Hernandez  MRN: [id number] DOB: 1945/06/04  Reason for Encounter: Patient Assistance For Trulicity  Patient Questions:  1.  Have you seen any other providers since your last visit? Patient was seen by PCP Park Liter, DO) on 01-17-20  2.  Any changes in your medicines or health? No changes noted in chart or reported by patient.     PCP : Valerie Roys, DO  Allergies:   Allergies  Allergen Reactions  . Lovastatin Nausea And Vomiting  . Bydureon [Exenatide] Rash    Medications: Outpatient Encounter Medications as of 01/17/2020  Medication Sig  . Blood Glucose Monitoring Suppl (ONE TOUCH ULTRA 2) w/Device KIT Test 3x a day  . buPROPion (WELLBUTRIN XL) 300 MG 24 hr tablet Take 1 tablet (300 mg total) by mouth daily.  . Dulaglutide (TRULICITY) 3 GU/5.4YH SOPN Inject 0.5 mLs (3 mg total) as directed once a week.  Marland Kitchen glucose blood test strip Use as instructed  . lisinopril-hydrochlorothiazide (ZESTORETIC) 20-25 MG tablet Take 2 tablets by mouth daily.  . meclizine (ANTIVERT) 25 MG tablet Take 1 tablet (25 mg total) by mouth 3 (three) times daily as needed for dizziness.  . metFORMIN (GLUCOPHAGE) 1000 MG tablet Take 1 tablet (1,000 mg total) by mouth 2 (two) times daily with a meal.  . Multiple Vitamin (MULTIVITAMIN) tablet Take 1 tablet by mouth daily.  . NYSTATIN powder APPLY  POWDER TOPICALLY 4 TIMES DAILY  . omeprazole (PRILOSEC) 20 MG capsule Take 2 capsules (40 mg total) by mouth daily.  . ondansetron (ZOFRAN-ODT) 8 MG disintegrating tablet DISSOLVE 1 TABLET IN MOUTH EVERY 8 HOURS AS NEEDED FOR NAUSEA OR VOMITING  . ONE TOUCH ULTRA TEST test strip Test 3x a day  . OneTouch Delica Lancets 06C MISC Test 3x a day  . rosuvastatin (CRESTOR) 10 MG tablet Take 1 tablet (10 mg total) by mouth daily.   No facility-administered encounter medications on file as of 01/17/2020.    Current Diagnosis: Patient Active Problem  List   Diagnosis Date Noted  . Senile purpura ([place]) 04/07/2018  . Swelling of lower limb 04/02/2016  . Anemia 08/30/2015  . Diabetes ([place]) 03/28/2015  . 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 Hyperlipidemia 03/28/2015  . Depression 03/28/2015    Goals Addressed   None    01-17-2020: Patient expressed financial concerns related to Trulicity prescription. Patient assistance application was pre-filled for patient. Patient returned information to primary care office, [facility] on 01-17-20. Application is pending due to needing prescriberes signature. Will continue to follow up.   01-19-2020: Patient assistance application was returned to the office, signed by the patient. The application is still incomplete due needing a copy of the patient's income. Attempted several times to contact patient requesting this needed information. Left general HIPPA compliant voicemail's requesting a call back. No call back at this time. CPP Birdena Crandall, [facility] verbally made aware of situation.   Alison Ned, LPN Clinical Pharmacist Assistant  [phone]     Follow-Up:  CPP and CPA to continue to contact patient requesting additional information for patient assistance application.

## 2020-01-17 NOTE — Progress Notes (Signed)
BP (!) 126/94   Pulse 94   Temp 98.7 F (37.1 C) (Oral)   Ht 5\' 6"  (1.676 m)   Wt 167 lb (75.8 kg)   SpO2 98%   BMI 26.95 kg/m    Subjective:    Patient ID: , female    DOB: 1945-07-03, 74 y.o.   MRN: [id number]  HPI: Alison Hernandez is a 74 y.o. female  Chief Complaint  Patient presents with  . Diabetes    T2DM follow up. Flu shot.   DIABETES Hypoglycemic episodes:no Polydipsia/polyuria: no Visual disturbance: no Chest pain: no Paresthesias: no Glucose Monitoring: no  Accucheck frequency: Not Checking Taking Insulin?: no Blood Pressure Monitoring: not checking Retinal Examination: Not up to Date Foot Exam: Not up to Date Diabetic Education: Completed Pneumovax: Up to Date Influenza: Up to Date Aspirin: no  Relevant past medical, surgical, family and social history reviewed and updated as indicated. Interim medical history since our last visit reviewed. Allergies and medications reviewed and updated.  Review of Systems  Constitutional: Negative.   Respiratory: Negative.   Cardiovascular: Negative.   Gastrointestinal: Positive for nausea and vomiting. Negative for abdominal distention, abdominal pain, anal bleeding, blood in stool, constipation, diarrhea and rectal pain.  Musculoskeletal: Negative.   Psychiatric/Behavioral: Negative.     Per HPI unless specifically indicated above     Objective:    BP (!) 126/94   Pulse 94   Temp 98.7 F (37.1 C) (Oral)   Ht 5\' 6"  (1.676 m)   Wt 167 lb (75.8 kg)   SpO2 98%   BMI 26.95 kg/m   Wt Readings from Last 3 Encounters:  01/17/20 167 lb (75.8 kg)  11/21/19 172 lb (78 kg)  10/18/19 172 lb 6.4 oz (78.2 kg)    Physical Exam Vitals and nursing note reviewed.  Constitutional:      General: She is not in acute distress.    Appearance: Normal appearance. She is not ill-appearing, toxic-appearing or diaphoretic.  HENT:     Head: Normocephalic and atraumatic.     Right Ear: External ear  normal.     Left Ear: External ear normal.     Nose: Nose normal.     Mouth/Throat:     Mouth: Mucous membranes are moist.     Pharynx: Oropharynx is clear.  Eyes:     General: No scleral icterus.       Right eye: No discharge.        Left eye: No discharge.     Extraocular Movements: Extraocular movements intact.     Conjunctiva/sclera: Conjunctivae normal.     Pupils: Pupils are equal, round, and reactive to light.  Cardiovascular:     Rate and Rhythm: Normal rate and regular rhythm.     Pulses: Normal pulses.     Heart sounds: Normal heart sounds. No murmur heard.  No friction rub. No gallop.   Pulmonary:     Effort: Pulmonary effort is normal. No respiratory distress.     Breath sounds: Normal breath sounds. No stridor. No wheezing, rhonchi or rales.  Chest:     Chest wall: No tenderness.  Musculoskeletal:        General: Normal range of motion.     Cervical back: Normal range of motion and neck supple.  Skin:    General: Skin is warm and dry.     Capillary Refill: Capillary refill takes less than 2 seconds.     Coloration: Skin is not jaundiced  or pale.     Findings: No bruising, erythema, lesion or rash.  Neurological:     General: No focal deficit present.     Mental Status: She is alert and oriented to person, place, and time. Mental status is at baseline.  Psychiatric:        Mood and Affect: Mood normal.        Behavior: Behavior normal.        Thought Content: Thought content normal.        Judgment: Judgment normal.     Results for orders placed or performed in visit on 10/18/19  Bayer DCA Hb A1c Waived  Result Value Ref Range   HB A1C (BAYER DCA - WAIVED) 7.1 (H) <7.0 %  Comprehensive metabolic panel  Result Value Ref Range   Glucose 138 (H) 65 - 99 mg/dL   BUN 28 (H) 8 - 27 mg/dL   Creatinine, Ser 1.19 (H) 0.57 - 1.00 mg/dL   GFR calc non Af Amer 45 (L) >59 mL/min/1.73   GFR calc Af Amer 52 (L) >59 mL/min/1.73   BUN/Creatinine Ratio 23 12 - 28    Sodium 136 134 - 144 mmol/L   Potassium 4.5 3.5 - 5.2 mmol/L   Chloride 101 96 - 106 mmol/L   CO2 23 20 - 29 mmol/L   Calcium 9.3 8.7 - 10.3 mg/dL   Total Protein 6.8 6.0 - 8.5 g/dL   Albumin 4.4 3.7 - 4.7 g/dL   Globulin, Total 2.4 1.5 - 4.5 g/dL   Albumin/Globulin Ratio 1.8 1.2 - 2.2   Bilirubin Total 0.4 0.0 - 1.2 mg/dL   Alkaline Phosphatase 79 48 - 121 IU/L   AST 12 0 - 40 IU/L   ALT 8 0 - 32 IU/L  Lipid Panel w/o Chol/HDL Ratio  Result Value Ref Range   Cholesterol, Total 137 100 - 199 mg/dL   Triglycerides 417 0 - 149 mg/dL   HDL 47 >40 mg/dL   VLDL Cholesterol Cal 25 5 - 40 mg/dL   LDL Chol Calc (NIH) 65 0 - 99 mg/dL      Assessment & Plan:   Problem List Items Addressed This Visit      Endocrine   Diabetes (HCC) - Primary    A1c up slightly at 7.4 from 7.1. Will really watch diet due to nausea after her trulicity and recheck 3 months. If the same or down- will stay on current regimen. If up, will increase her trulicity.      Relevant Orders   Bayer DCA Hb A1c Waived    Other Visit Diagnoses    Need for immunization against influenza       Flu shot given today.   Relevant Orders   Flu Vaccine QUAD High Dose(Fluad) (Completed)   Encounter for screening mammogram for malignant neoplasm of breast       Mammogram ordered today.   Relevant Orders   MM 3D SCREEN BREAST BILATERAL       Follow up plan: Return in about 3 months (around 04/18/2020).

## 2020-01-17 NOTE — Assessment & Plan Note (Signed)
A1c up slightly at 7.4 from 7.1. Will really watch diet due to nausea after her trulicity and recheck 3 months. If the same or down- will stay on current regimen. If up, will increase her trulicity.

## 2020-01-24 NOTE — Telephone Encounter (Signed)
Pt has been r/s  

## 2020-01-31 ENCOUNTER — Other Ambulatory Visit: Payer: Self-pay | Admitting: Family Medicine

## 2020-02-08 ENCOUNTER — Other Ambulatory Visit: Payer: Self-pay | Admitting: Family Medicine

## 2020-02-08 DIAGNOSIS — Z1231 Encounter for screening mammogram for malignant neoplasm of breast: Secondary | ICD-10-CM

## 2020-02-21 ENCOUNTER — Ambulatory Visit
Admission: RE | Admit: 2020-02-21 | Discharge: 2020-02-21 | Disposition: A | Discharge status: Home / Self Care | Payer: Medicare Other | Source: Ambulatory Visit | Attending: Family Medicine | Admitting: Family Medicine

## 2020-02-21 ENCOUNTER — Other Ambulatory Visit: Payer: Self-pay

## 2020-02-21 DIAGNOSIS — Z1231 Encounter for screening mammogram for malignant neoplasm of breast: Secondary | ICD-10-CM | POA: Diagnosis not present

## 2020-02-27 ENCOUNTER — Other Ambulatory Visit: Payer: Self-pay | Admitting: Family Medicine

## 2020-02-27 ENCOUNTER — Other Ambulatory Visit: Payer: Self-pay | Admitting: Obstetrics & Gynecology

## 2020-02-27 ENCOUNTER — Telehealth: Payer: Self-pay

## 2020-02-27 DIAGNOSIS — R928 Other abnormal and inconclusive findings on diagnostic imaging of breast: Secondary | ICD-10-CM

## 2020-02-27 NOTE — Telephone Encounter (Signed)
Routing to provider  

## 2020-02-27 NOTE — Telephone Encounter (Signed)
Copied from CRM [id number]. Topic: General - Inquiry >> Feb 27, 2020  3:16 PM Daphine Deutscher D wrote: Reason for CRM: Pt's daughter Victorino Dike called saying mom needs an order put in for mammogram at [facility].  She had one done through the mobile  unit But they did not get a good reading and told her to get a repeat of her right breast but she wants the [facility] to do this one.  CB#  [phone]

## 2020-03-06 ENCOUNTER — Ambulatory Visit (INDEPENDENT_AMBULATORY_CARE_PROVIDER_SITE_OTHER): Payer: Medicare Other | Admitting: Family Medicine

## 2020-03-06 ENCOUNTER — Other Ambulatory Visit: Payer: Self-pay

## 2020-03-06 ENCOUNTER — Ambulatory Visit: Payer: Self-pay | Admitting: *Deleted

## 2020-03-06 ENCOUNTER — Encounter: Payer: Self-pay | Admitting: Family Medicine

## 2020-03-06 VITALS — BP 138/83 | HR 92 | Temp 98.1°F | Wt 164.4 lb

## 2020-03-06 DIAGNOSIS — N182 Chronic kidney disease, stage 2 (mild): Secondary | ICD-10-CM

## 2020-03-06 DIAGNOSIS — E1122 Type 2 diabetes mellitus with diabetic chronic kidney disease: Secondary | ICD-10-CM | POA: Diagnosis not present

## 2020-03-06 LAB — GLUCOSE HEMOCUE WAIVED: Glu Hemocue Waived: 174 mg/dL — ABNORMAL HIGH (ref 65–99)

## 2020-03-06 NOTE — Assessment & Plan Note (Signed)
With hyperglycemic episodes in the setting of missing trulicity dose. CBG 174 in office today after reducing food intake and increased oral hydration. Will have patient give half dose trulicity today and resume normal dose on Saturday as scheduled. Continue CBG checks.  Return and emergency precautions discussed, able to verbalize action plan for hypo/hyperglycemic episodes. F/u in one month as scheduled.

## 2020-03-06 NOTE — Telephone Encounter (Addendum)
Pt calling c/o her glucose being elevated.  When she first got up it was 211.  After eating a pancake and sugar free syrup it's 344.  Yesterday her glucose got up to 316.  "It stays around 211 in the mornings". She mentioned she forgot to give herself the Trulicity shot on Saturday.   She also takes Metformin which she is taking.  "I'm just worried about my sugar staying so hight".    She also c/o being "wobbly" and "peeing a lot".   "I went to the bathroom several times last night".  She mentioned she always feels "wobbly" when her sugar is high".  See notes below.  There are no appts available.  I'm sending these notes to [facility] high priority so they can contact her regarding scheduling perhaps with Dr. Linwood Dibbles.   [facility] does not schedule for her.  Pt wanting to know if she should take her Trulicity shot today since she didn't take it on Saturday when she normally does.  I let her know I would send that question along in the message too.  She was agreeable to someone calling her back.  I did encourage her to drink plenty of water.  I called into the office to make sure they got the my note and be aware of what was going on with her.  Reason for Disposition . [1] Blood glucose > 300 mg/dL (19.3 mmol/L) AND [7] two or more times in a row  Answer Assessment - Initial Assessment Questions 1. BLOOD GLUCOSE: "What is your blood glucose level?"      This morning 211 fasting.   Now it's 344.  I ate a pancake and sugar free syrup.  I don't understand.   Yesterday glucose is 316. 2. ONSET: "When did you check the blood glucose?"     This morning.  I on Metformin and Trulicity. 3. USUAL RANGE: "What is your glucose level usually?" (e.g., usual fasting morning value, usual evening value)     It's like 211 in the mornings..  In afternoon it's lower. 4. KETONES: "Do you check for ketones (urine or blood test strips)?" If yes, ask: "What does the test show now?"      No 5. TYPE 1 or  2:  "Do you know what type of diabetes you have?"  (e.g., Type 1, Type 2, Gestational; doesn't know)      Type II. 6. INSULIN: "Do you take insulin?" "What type of insulin(s) do you use? What is the mode of delivery? (syringe, pen; injection or pump)?"      No 7. DIABETES PILLS: "Do you take any pills for your diabetes?" If yes, ask: "Have you missed taking any pills recently?"     Metformin and Trucilty.  I missed my Trulicity shot on Saturday.    My glucose is high before this. 8. OTHER SYMPTOMS: "Do you have any symptoms?" (e.g., fever, frequent urination, difficulty breathing, dizziness, weakness, vomiting)     I'm wobbly and peeing all night.  I started feeling wobbly yesterday and today.   I hold onto the dresser to walk.   My daughter helps me get around.  I'm dizzy all the time.  Only when my sugar is up. 9. PREGNANCY: "Is there any chance you are pregnant?" "When was your last menstrual period?"     N/A due to age  Protocols used: DIABETES - HIGH BLOOD SUGAR-A-AH

## 2020-03-06 NOTE — Telephone Encounter (Signed)
Looks like schedule today with Dr. Linwood Dibbles, this will be beneficial as needs appointment ASAP for further assessment.

## 2020-03-06 NOTE — Telephone Encounter (Signed)
Yes, I called her and scheduled her when someone had cancelled, I tried to add to note but it wouldn't let me

## 2020-03-06 NOTE — Progress Notes (Signed)
   SUBJECTIVE:   CHIEF COMPLAINT / HPI:   Past Medical History:  Diagnosis Date  . Diabetes mellitus without complication (HCC)   . Hyperlipidemia   . Hypertension    Diabetes, Type 2 - forgot to take trulicity on Saturday as scheduled, noted higher CBGs in 200s. Yesterday got up to 316. - 344 this am. 208 mid morning. 188 just before coming for appt.  - has been 200s in the past, never in 300s - had sugar free jello, pancake with sugar free syrup today - some shakiness, polyuria, polydipsia.  - Last A1c 7.4 12/2019 - Medications: trulicity 3mg  weekly, metformin 1000mg  BID - Compliance: good with metformin, has missed trulicity twice.  - Checking BG at home: twice daily when remembers.  - Eye exam: due, will make appt - Foot exam: UTD - Statin: yes - Denies symptoms of numbness extremities, foot ulcers/trauma, vision changes, headaches.    OBJECTIVE:   BP 138/83   Pulse 92   Temp 98.1 F (36.7 C)   Wt 164 lb 6.4 oz (74.6 kg)   SpO2 99%   BMI 26.53 kg/m   Gen: elderly, in NAD Cardiac: RRR, no murmur Lungs: CTAB Ext: WWP, no edema  ASSESSMENT/PLAN:   Diabetes (HCC) With hyperglycemic episodes in the setting of missing trulicity dose. CBG 174 in office today after reducing food intake and increased oral hydration. Will have patient give half dose trulicity today and resume normal dose on Saturday as scheduled. Continue CBG checks.  Return and emergency precautions discussed, able to verbalize action plan for hypo/hyperglycemic episodes. F/u in one month as scheduled.   , DO

## 2020-03-06 NOTE — Patient Instructions (Signed)
It was great to see you!  Our plans for today:  - Take half dose (1.5mg ) of your trulicity today. Resume full dose of trulicity on Saturday as scheduled.  - Make sure to stay hydrated.  - If you develop nausea, vomiting, jitteriness, sweating, check your blood sugar. - Keep an eye on your blood sugar every day.  Take care and seek immediate care sooner if you develop any concerns.   Dr. Linwood Dibbles

## 2020-03-13 ENCOUNTER — Telehealth: Payer: Self-pay | Admitting: Pharmacist

## 2020-03-13 NOTE — Chronic Care Management (AMB) (Signed)
° ° °  Chronic Care Management Pharmacy Assistant   Name: Alison Hernandez  MRN: [id number] DOB: [birth year]  Reason for Encounter: Patient Assistance Application   PCP : Alison Roys, DO  Allergies:   Allergies  Allergen Reactions   Lovastatin Nausea And Vomiting   Bydureon [Exenatide] Rash    Medications: Outpatient Encounter Medications as of 03/13/2020  Medication Sig   Blood Glucose Monitoring Suppl (ONE TOUCH ULTRA 2) w/Device KIT Test 3x a day   buPROPion (WELLBUTRIN XL) 300 MG 24 hr tablet Take 1 tablet (300 mg total) by mouth daily.   Dulaglutide (TRULICITY) 3 QI/3.4VQ SOPN Inject 0.5 mLs (3 mg total) as directed once a week.   glucose blood test strip Use as instructed   lisinopril-hydrochlorothiazide (ZESTORETIC) 20-25 MG tablet Take 2 tablets by mouth daily.   meclizine (ANTIVERT) 25 MG tablet Take 1 tablet (25 mg total) by mouth 3 (three) times daily as needed for dizziness.   metFORMIN (GLUCOPHAGE) 1000 MG tablet Take 1 tablet (1,000 mg total) by mouth 2 (two) times daily with a meal.   Multiple Vitamin (MULTIVITAMIN) tablet Take 1 tablet by mouth daily.   NYSTATIN powder APPLY  POWDER TOPICALLY 4 TIMES DAILY   omeprazole (PRILOSEC) 20 MG capsule Take 2 capsules (40 mg total) by mouth daily.   ondansetron (ZOFRAN-ODT) 8 MG disintegrating tablet DISSOLVE 1 TABLET IN MOUTH EVERY 8 HOURS AS NEEDED FOR NAUSEA OR VOMITING   ONE TOUCH ULTRA TEST test strip Test 3x a day   OneTouch Delica Lancets 25Z MISC Test 3x a day   rosuvastatin (CRESTOR) 10 MG tablet Take 1 tablet by mouth once daily   No facility-administered encounter medications on file as of 03/13/2020.    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 No answer left HIPAA compliant voicemail requesting call back.   03-19-2020: Second attempt to contact patient related to her pending patient assistance application. Again no answer. Left HIPAA compliant voicemail requesting a call back.   04-09-2020: Patient assistance applications were faxed by PharmD on 03-29-2020.   Cloretta Ned, LPN Clinical Pharmacist Assistant  [phone]   Follow-Up:  Pharmacist Review

## 2020-03-20 ENCOUNTER — Telehealth: Payer: Self-pay | Admitting: Pharmacist

## 2020-03-20 ENCOUNTER — Ambulatory Visit: Payer: Self-pay | Admitting: General Practice

## 2020-03-20 ENCOUNTER — Telehealth: Payer: Medicare Other | Admitting: General Practice

## 2020-03-20 DIAGNOSIS — F3341 Major depressive disorder, recurrent, in partial remission: Secondary | ICD-10-CM

## 2020-03-20 DIAGNOSIS — E782 Mixed hyperlipidemia: Secondary | ICD-10-CM

## 2020-03-20 DIAGNOSIS — E1122 Type 2 diabetes mellitus with diabetic chronic kidney disease: Secondary | ICD-10-CM

## 2020-03-20 DIAGNOSIS — N182 Chronic kidney disease, stage 2 (mild): Secondary | ICD-10-CM

## 2020-03-20 NOTE — Chronic Care Management (AMB) (Signed)
Chronic Care Management   Follow Up Note   03/20/2020 Name: Alison Hernandez MRN: [id number] DOB: September 10, 1945  Referred by: Valerie Roys, DO Reason for referral : Chronic Care Management (RNCM Follow up for Chronic Disease Management and Care Coordination Needs)   Alison Hernandez is a 74 y.o. year old female who is a primary care patient of Valerie Roys, DO. The CCM team was consulted for assistance with chronic disease management and care coordination needs.    Review of patient status, including review of consultants reports, relevant laboratory and other test results, and collaboration with appropriate care team members and the patient's provider was performed as part of comprehensive patient evaluation and provision of chronic care management services.    SDOH (Social Determinants of Health) assessments performed: Yes See Care Plan activities for detailed interventions related to [facility])     Outpatient Encounter Medications as of 03/20/2020  Medication Sig  . Blood Glucose Monitoring Suppl (ONE TOUCH ULTRA 2) w/Device KIT Test 3x a day  . buPROPion (WELLBUTRIN XL) 300 MG 24 hr tablet Take 1 tablet (300 mg total) by mouth daily.  . Dulaglutide (TRULICITY) 3 LX/7.2IO SOPN Inject 0.5 mLs (3 mg total) as directed once a week.  Marland Kitchen glucose blood test strip Use as instructed  . lisinopril-hydrochlorothiazide (ZESTORETIC) 20-25 MG tablet Take 2 tablets by mouth daily.  . meclizine (ANTIVERT) 25 MG tablet Take 1 tablet (25 mg total) by mouth 3 (three) times daily as needed for dizziness.  . metFORMIN (GLUCOPHAGE) 1000 MG tablet Take 1 tablet (1,000 mg total) by mouth 2 (two) times daily with a meal.  . Multiple Vitamin (MULTIVITAMIN) tablet Take 1 tablet by mouth daily.  . NYSTATIN powder APPLY  POWDER TOPICALLY 4 TIMES DAILY  . omeprazole (PRILOSEC) 20 MG capsule Take 2 capsules (40 mg total) by mouth daily.  . ondansetron (ZOFRAN-ODT) 8 MG disintegrating tablet DISSOLVE 1 TABLET IN  MOUTH EVERY 8 HOURS AS NEEDED FOR NAUSEA OR VOMITING  . ONE TOUCH ULTRA TEST test strip Test 3x a day  . OneTouch Delica Lancets 03T MISC Test 3x a day  . rosuvastatin (CRESTOR) 10 MG tablet Take 1 tablet by mouth once daily   No facility-administered encounter medications on file as of 03/20/2020.     Objective:   Goals Addressed   None     Patient Care Plan: RNCM: Medication Compliance    Problem Identified: RNCM: Medication Adherence (Wellness)   Priority: High  Note:   The patient states she is out of Trulicity and can not afford the 224.00 to get it filled.  Has submitted paperwork to pharmacist.    Goal: RNCM: Medication Adherence- Trulicity for management of DM   Start Date: 03/20/2020  Expected End Date: 05/19/2020  Priority: High  Note:   Current Barriers:  Marland Kitchen Knowledge Deficits related to calling the office to see if any samples of Turlicity are available and maintaining contact with the CCM pharmacist for expressed needs of needing help with obtaining Trulicity to take as prescribed  . Care Coordination needs related to patient being out of Trulicity  in a patient with DM that is uncontrolled  . Chronic Disease Management support and education needs related to DM  . Lacks caregiver support.  . Film/video editor.  . Difficulty obtaining medications . Unable to independently pay for Trulicity as cost for refill is 224.00 and patient has not taken x 2 weeks and is currently out. Blood sugars are elevated.  Marland Kitchen  Unable to self administer medications as prescribed . Does not adhere to prescribed medication regimen . Does not maintain contact with provider office . Does not contact provider office for questions/concerns  Nurse Case Manager Clinical Goal(s):  Marland Kitchen Over the next 60 days, patient will verbalize understanding of plan for working with the pcp and CCM pharmacist to get needed supply of Trulicity without missing doses of medication . Over the next 60 days, patient  will attend all scheduled medical appointments: 04-03-2020 with the office calling to see if the patient can get a sooner appointment to be seen . Over the next 60 days, patient will work with CM team pharmacist to get needed paperwork completed for help with Trulicity supply  . Over the next 60 days, the patient will demonstrate ongoing self health care management ability as evidenced by being proactive in her care and notifying the CCM team when she has questions/concerns/needs  Interventions:  . 1:1 collaboration with Valerie Roys, DO regarding development and update of comprehensive plan of care as evidenced by provider attestation and co-signature . Inter-disciplinary care team collaboration (see longitudinal plan of care) . Evaluation of current treatment plan related to DM management and the use of Trulicity and patient's adherence to plan as established by provider. . Advised patient to take calls from the CCM pharmacist, call when messages are left and communicate needs to the CCM team.  . Provided education to patient re: RNCM contacting the pharmacist and Dr. Wynetta Emery with expressed needs . Reviewed medications with patient and discussed the patient is not being compliant with Trulicity. Out of medication x 2 weeks. Can not afford 224.00 to refill. States she filled out paperwork in October but has not heard back from the paperwork being submitted.  Nash Dimmer with CCM pharmacist  regarding help with patient getting Trulicity . Pharmacy referral for cost prohibitive needs related to not being able to afford Trulicity and being out x 2 weeks  . - barriers to medication adherence identified . - completion of financial assistance requests facilitated . - medication list reviewed . - medication-adherence assessment completed . - medication reminder use encouraged . - self-management plan initiated or updated . - strategies for improving adherence encouraged . - understanding of  current medications assessed  Patient Goals/Self-Care Activities Over the next 120 days, patient will:  - Patient will self administer medications as prescribed Patient will attend all scheduled provider appointments Patient will call pharmacy for medication refills Patient will call provider office for new concerns or questions  Follow Up Plan: Telephone follow up appointment with care management team member scheduled for: 05-01-2020 at 11:45am       Task: RNCM: Optimize Medication Use   Note:   Care Management Activities:    - barriers to medication adherence identified - completion of financial assistance requests facilitated - medication list reviewed - medication-adherence assessment completed - medication reminder use encouraged - self-management plan initiated or updated - strategies for improving adherence encouraged - understanding of current medications assessed    Notes: Pharmacist is working with the patient also   Patient Care Plan: RNCM:Diabetes Type 2 (Adult)    Problem Identified: RNCM: Glycemic Management (Diabetes, Type 2)   Priority: High  Note:   Uncontrolled DM   Goal: RNCM: Disease Management and Care Coordination of DM   Start Date: 03/20/2020  Priority: High  Note:   Objective:  Lab Results  Component Value Date   HGBA1C 7.4 (H) 01/17/2020 .   Lab  Results  Component Value Date   CREATININE 1.20 (H) 10/18/2019   CREATININE 1.22 (H) 05/19/2019   CREATININE 1.09 (H) 11/18/2018 .   Marland Kitchen No results found for: EGFR Current Barriers:  Marland Kitchen Knowledge Deficits related to basic Diabetes pathophysiology and self care/management . Knowledge Deficits related to medications used for management of diabetes . 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 Unable to independently manage DM and inability to take Trulicity as prescribed due to being cost prohibitive . Unable to self administer medications as  prescribed . Does not adhere to provider recommendations re: taking medications and calling the office when she is out of Trulicity and can not afford.  . Does not adhere to prescribed medication regimen . Does not contact provider office for questions/concerns Case Manager Clinical Goal(s):  Marland Kitchen Over the next 120 days, patient will demonstrate improved adherence to prescribed treatment plan for diabetes self care/management as evidenced by:  . daily monitoring and recording of CBG  . adherence to ADA/ carb modified diet . adherence to prescribed medication regimen Interventions:  . Provided education to patient about basic DM disease process . Reviewed medications with patient and discussed importance of medication adherence . Discussed plans with patient for ongoing care management follow up and provided patient with direct contact information for care management team . Provided patient with written educational materials related to hypo and hyperglycemia and importance of correct treatment . Reviewed scheduled/upcoming provider appointments including: 04-03-2020, but trying to get an earlier appoingment  . Advised patient, providing education and rationale, to check cbg bid and record, calling pcp for findings outside established parameters.  The patient states she has not taken Trulicity in 2 weeks and cannot afford the 224.00 to get the medications refilled. In basket message sent to Birdena Crandall the CCM pharmacist for assistance with medication needs. States her readings have been "high". Last reading 234. Marland Kitchen Referral made to pharmacy team for assistance with Trulicity needs . Review of patient status, including review of consultants reports, relevant laboratory and other test results, and medications completed. . - barriers to adherence to treatment plan identified . - blood glucose monitoring encouraged . - blood glucose readings reviewed . - individualized medical nutrition therapy  provided . - mutual A1C goal set or reviewed . - resources required to improve adherence to care identified . - self-awareness of signs/symptoms of hypo or hyperglycemia encouraged . - use of blood glucose monitoring log promoted Patient Goals/Self-Care Activities . Over the next 120 days, patient will:  - UNABLE to independently manage DM Self administers oral medications as prescribed Self administers injectable DM medication (Trulicity) as prescribed Attends all scheduled provider appointments Checks blood sugars as prescribed and utilize hyper and hypoglycemia protocol as needed Adheres to prescribed ADA/carb modified Follow Up Plan: Telephone follow up appointment with care management team member scheduled for: 05-01-2020 at 11:45 am   Task: RNCM: Alleviate Barriers to Glycemic Management   Note:   Care Management Activities:    - barriers to adherence to treatment plan identified - blood glucose monitoring encouraged - blood glucose readings reviewed - individualized medical nutrition therapy provided - mutual A1C goal set or reviewed - resources required to improve adherence to care identified - self-awareness of signs/symptoms of hypo or hyperglycemia encouraged - use of blood glucose monitoring log promoted       Patient Care Plan: RNCM: Depression Management    Problem Identified: RNCM: Depression Identification (Depression)  Priority: Medium  Note:   The patient has a history of depression   Goal: RNCM: Depression Management   Start Date: 03/20/2020  Priority: Medium  Note:   Current Barriers:  Marland Kitchen Knowledge Deficits related to effective management of depression . Chronic Disease Management support and education needs related to depression . Lacks caregiver support.  . Unable to independently depression . Unable to self administer medications as prescribed . Lacks social connections . Does not contact provider office for questions/concerns  Nurse Case Manager  Clinical Goal(s):  Marland Kitchen Over the next 120 days, patient will verbalize understanding of plan for management of depression . Over the next 120 days, patient will attend all scheduled medical appointments: 04-03-2020 with pcp . Over the next 120 days, the patient will demonstrate ongoing self health care management ability as evidenced by mood stable and no new concerns related to depression  Interventions:  . 1:1 collaboration with Valerie Roys, DO regarding development and update of comprehensive plan of care as evidenced by provider attestation and co-signature . Inter-disciplinary care team collaboration (see longitudinal plan of care) . Evaluation of current treatment plan related to depression and patient's adherence to plan as established by provider. . Advised patient to call with changes in mood or depressed state . Provided education to patient re: depression and effective management  . Discussed plans with patient for ongoing care management follow up and provided patient with direct contact information for care management team . - anxiety screen reviewed . - depression screen reviewed . - medication list reviewed  Patient Goals/Self-Care Activities Over the next 120 days, patient will:  - Patient will self administer medications as prescribed Patient will attend all scheduled provider appointments Patient will call provider office for new concerns or questions  Follow Up Plan: Telephone follow up appointment with care management team member scheduled for: 05-01-2020 at 11:45 am       Task: RNCM: Identify Depressive Symptoms and Facilitate Treatment   Note:   Care Management Activities:    - anxiety screen reviewed - depression screen reviewed - medication list reviewed       Patient Care Plan: RNCM: Management of HLD    Problem Identified: RNCM: Management of HLD and associated issues related to HLD/Cardiac function   Priority: Medium    Goal: RNCM: Effective  management of HLD   Start Date: 03/20/2020  Priority: Medium  Note:   Current Barriers:  Marland Kitchen Knowledge Deficits related to needed follow up for abnormal mammogram with abnormality in right breast. Patient states she needs a referral for follow up and has not heard back from the office  . Care Coordination needs related to need for follow up for [place] of right breast and new onset of dizziness over the last 3 weeks in a patient with HLD and other Chronic Conditions . Chronic Disease Management support and education needs related to HLD and associated chronic conditions  . Lacks caregiver support.  . Film/video editor.  . Non-adherence to prescribed medication regimen . Difficulty obtaining medications . Unable to independently manage HLD, follow up needs for abnormal mammogram and new onset of dizziness . Unable to self administer medications as prescribed . Does not adhere to provider recommendations re: follow up [place] for abnormal mammogram . Does not adhere to prescribed medication regimen . Lacks social connections . Does not maintain contact with provider office . Does not contact provider office for questions/concerns  Nurse Case Manager Clinical Goal(s):  Marland Kitchen Over the next 120  days, patient will verbalize understanding of plan for HLD and following up with the pcp for dizziness and abnormal mammogram findings . Over the next 120 days, patient will work with [facility], CCM team and pcp to address needs related to medication compliance, expressed needs to be addressed related to abnormal mammogram and new onset of dizziness.  . Over the next 120 days, patient will attend all scheduled medical appointments: next appointment with the pcp on 04-03-2020 but the patient asking if she can be seen sooner.  . Over the next 120 days, patient will demonstrate improved health management independence as evidenced bycompliance with the plan of care established by the provider, cholesterol levels stable and  stabilized condition . Over the next 120 days, the patient will demonstrate ongoing self health care management ability as evidenced by having the means to get needed medications, call provider office for new changes or concerns, and work with the CCM team to meet her health and wellness goals.   Interventions:  . 1:1 collaboration with Valerie Roys, DO regarding development and update of comprehensive plan of care as evidenced by provider attestation and co-signature . Inter-disciplinary care team collaboration (see longitudinal plan of care) . Evaluation of current treatment plan related to HLD  and patient's adherence to plan as established by provider. . Advised patient to write down questions for the pcp to address at next pcp visit, especially concerning new onset of dizziness and abnormal mammogram with suggestion to have further testing of right breast.  . Provided education to patient re: heart healthy diet and adherence. Possiblity of patient having orthostatic hypotension, review of safety- patient had a fall 2 weeks ago without injury. Did not go to be evaluated.  . Reviewed medications with patient and discussed compliance  . Collaborated with pcp and front office staff  regarding the need for a sooner appointment.  . Discussed plans with patient for ongoing care management follow up and provided patient with direct contact information for care management team . Provided patient with HLD educational materials related to how to best control HLD and dietary restrictions through the my chart system . Reviewed scheduled/upcoming provider appointments including: Next pcp appointment on 04-03-2020 at 1:40 pm- inbasket message sent to [facility] admin staff to call the patient and get a sooner appointment.   Patient Goals/Self-Care Activities Over the next 120 days, patient will:  - Patient will self administer medications as prescribed Patient will attend all scheduled provider  appointments Patient will call pharmacy for medication refills Patient will call provider office for new concerns or questions  Follow Up Plan: Telephone follow up appointment with care management team member scheduled for: 05-01-2020 at 11:45 am         Plan:   Telephone follow up appointment with care management team member scheduled for: 05-01-2020 at 11:45 am  Noreene Larsson RN, MSN, [facility] Mobile: [phone]

## 2020-03-20 NOTE — Progress Notes (Signed)
Pt scheduled for 03/11/2020 with Dr.Rumball

## 2020-03-20 NOTE — Telephone Encounter (Signed)
Spoke with patient today. She will provide requested income information for patient assistnace

## 2020-03-20 NOTE — Patient Instructions (Signed)
Visit Information  Patient Care Plan: RNCM: Medication Compliance    Problem Identified: RNCM: Medication Adherence (Wellness)   Priority: High  Note:   The patient states she is out of Trulicity and can not afford the 224.00 to get it filled.  Has submitted paperwork to pharmacist.    Goal: RNCM: Medication Adherence- Trulicity for management of DM   Start Date: 03/20/2020  Expected End Date: 05/19/2020  Priority: High  Note:   Current Barriers:  Marland Kitchen Knowledge Deficits related to calling the office to see if any samples of Turlicity are available and maintaining contact with the CCM pharmacist for expressed needs of needing help with obtaining Trulicity to take as prescribed  . Care Coordination needs related to patient being out of Trulicity  in a patient with DM that is uncontrolled  . Chronic Disease Management support and education needs related to DM  . Lacks caregiver support.  . Film/video editor.  . Difficulty obtaining medications . Unable to independently pay for Trulicity as cost for refill is 224.00 and patient has not taken x 2 weeks and is currently out. Blood sugars are elevated.  . Unable to self administer medications as prescribed . Does not adhere to prescribed medication regimen . Does not maintain contact with provider office . Does not contact provider office for questions/concerns  Nurse Case Manager Clinical Goal(s):  Marland Kitchen Over the next 60 days, patient will verbalize understanding of plan for working with the pcp and CCM pharmacist to get needed supply of Trulicity without missing doses of medication . Over the next 60 days, patient will attend all scheduled medical appointments: 04-03-2020 with the office calling to see if the patient can get a sooner appointment to be seen . Over the next 60 days, patient will work with CM team pharmacist to get needed paperwork completed for help with Trulicity supply  . Over the next 60 days, the patient will demonstrate  ongoing self health care management ability as evidenced by being proactive in her care and notifying the CCM team when she has questions/concerns/needs  Interventions:  . 1:1 collaboration with Valerie Roys, DO regarding development and update of comprehensive plan of care as evidenced by provider attestation and co-signature . Inter-disciplinary care team collaboration (see longitudinal plan of care) . Evaluation of current treatment plan related to DM management and the use of Trulicity and patient's adherence to plan as established by provider. . Advised patient to take calls from the CCM pharmacist, call when messages are left and communicate needs to the CCM team.  . Provided education to patient re: RNCM contacting the pharmacist and Dr. Wynetta Emery with expressed needs . Reviewed medications with patient and discussed the patient is not being compliant with Trulicity. Out of medication x 2 weeks. Can not afford 224.00 to refill. States she filled out paperwork in October but has not heard back from the paperwork being submitted.  Nash Dimmer with CCM pharmacist  regarding help with patient getting Trulicity . Pharmacy referral for cost prohibitive needs related to not being able to afford Trulicity and being out x 2 weeks  . - barriers to medication adherence identified . - completion of financial assistance requests facilitated . - medication list reviewed . - medication-adherence assessment completed . - medication reminder use encouraged . - self-management plan initiated or updated . - strategies for improving adherence encouraged . - understanding of current medications assessed  Patient Goals/Self-Care Activities Over the next 120 days, patient will:  - Patient  will self administer medications as prescribed Patient will attend all scheduled provider appointments Patient will call pharmacy for medication refills Patient will call provider office for new concerns or  questions  Follow Up Plan: Telephone follow up appointment with care management team member scheduled for: 05-01-2020 at 11:45am       Task: RNCM: Optimize Medication Use   Note:   Care Management Activities:    - barriers to medication adherence identified - completion of financial assistance requests facilitated - medication list reviewed - medication-adherence assessment completed - medication reminder use encouraged - self-management plan initiated or updated - strategies for improving adherence encouraged - understanding of current medications assessed    Notes: Pharmacist is working with the patient also   Patient Care Plan: RNCM:Diabetes Type 2 (Adult)    Problem Identified: RNCM: Glycemic Management (Diabetes, Type 2)   Priority: High  Note:   Uncontrolled DM   Goal: RNCM: Disease Management and Care Coordination of DM   Start Date: 03/20/2020  Priority: High  Note:   Objective:  Lab Results  Component Value Date   HGBA1C 7.4 (H) 01/17/2020 .   Lab Results  Component Value Date   CREATININE 1.20 (H) 10/18/2019   CREATININE 1.22 (H) 05/19/2019   CREATININE 1.09 (H) 11/18/2018 .   Marland Kitchen No results found for: EGFR Current Barriers:  Marland Kitchen Knowledge Deficits related to basic Diabetes pathophysiology and self care/management . Knowledge Deficits related to medications used for management of diabetes . 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 Unable to independently manage DM and inability to take Trulicity as prescribed due to being cost prohibitive . Unable to self administer medications as prescribed . Does not adhere to provider recommendations re: taking medications and calling the office when she is out of Trulicity and can not afford.  . Does not adhere to prescribed medication regimen . Does not contact provider office for questions/concerns Case Manager Clinical Goal(s):  Marland Kitchen Over the next 120 days,  patient will demonstrate improved adherence to prescribed treatment plan for diabetes self care/management as evidenced by:  . daily monitoring and recording of CBG  . adherence to ADA/ carb modified diet . adherence to prescribed medication regimen Interventions:  . Provided education to patient about basic DM disease process . Reviewed medications with patient and discussed importance of medication adherence . Discussed plans with patient for ongoing care management follow up and provided patient with direct contact information for care management team . Provided patient with written educational materials related to hypo and hyperglycemia and importance of correct treatment . Reviewed scheduled/upcoming provider appointments including: 04-03-2020, but trying to get an earlier appoingment  . Advised patient, providing education and rationale, to check cbg bid and record, calling pcp for findings outside established parameters.  The patient states she has not taken Trulicity in 2 weeks and cannot afford the 224.00 to get the medications refilled. In basket message sent to Birdena Crandall the CCM pharmacist for assistance with medication needs. States her readings have been "high". Last reading 234. Marland Kitchen Referral made to pharmacy team for assistance with Trulicity needs . Review of patient status, including review of consultants reports, relevant laboratory and other test results, and medications completed. . - barriers to adherence to treatment plan identified . - blood glucose monitoring encouraged . - blood glucose readings reviewed . - individualized medical nutrition therapy provided . - mutual A1C goal set or reviewed . - resources required to improve  adherence to care identified . - self-awareness of signs/symptoms of hypo or hyperglycemia encouraged . - use of blood glucose monitoring log promoted Patient Goals/Self-Care Activities . Over the next 120 days, patient will:  - UNABLE to  independently manage DM Self administers oral medications as prescribed Self administers injectable DM medication (Trulicity) as prescribed Attends all scheduled provider appointments Checks blood sugars as prescribed and utilize hyper and hypoglycemia protocol as needed Adheres to prescribed ADA/carb modified Follow Up Plan: Telephone follow up appointment with care management team member scheduled for: 05-01-2020 at 11:45 am   Task: RNCM: Alleviate Barriers to Glycemic Management   Note:   Care Management Activities:    - barriers to adherence to treatment plan identified - blood glucose monitoring encouraged - blood glucose readings reviewed - individualized medical nutrition therapy provided - mutual A1C goal set or reviewed - resources required to improve adherence to care identified - self-awareness of signs/symptoms of hypo or hyperglycemia encouraged - use of blood glucose monitoring log promoted       Patient Care Plan: RNCM: Depression Management    Problem Identified: RNCM: Depression Identification (Depression)   Priority: Medium  Note:   The patient has a history of depression   Goal: RNCM: Depression Management   Start Date: 03/20/2020  Priority: Medium  Note:   Current Barriers:  Marland Kitchen Knowledge Deficits related to effective management of depression . Chronic Disease Management support and education needs related to depression . Lacks caregiver support.  . Unable to independently depression . Unable to self administer medications as prescribed . Lacks social connections . Does not contact provider office for questions/concerns  Nurse Case Manager Clinical Goal(s):  Marland Kitchen Over the next 120 days, patient will verbalize understanding of plan for management of depression . Over the next 120 days, patient will attend all scheduled medical appointments: 04-03-2020 with pcp . Over the next 120 days, the patient will demonstrate ongoing self health care management ability as  evidenced by mood stable and no new concerns related to depression  Interventions:  . 1:1 collaboration with Valerie Roys, DO regarding development and update of comprehensive plan of care as evidenced by provider attestation and co-signature . Inter-disciplinary care team collaboration (see longitudinal plan of care) . Evaluation of current treatment plan related to depression and patient's adherence to plan as established by provider. . Advised patient to call with changes in mood or depressed state . Provided education to patient re: depression and effective management  . Discussed plans with patient for ongoing care management follow up and provided patient with direct contact information for care management team . - anxiety screen reviewed . - depression screen reviewed . - medication list reviewed  Patient Goals/Self-Care Activities Over the next 120 days, patient will:  - Patient will self administer medications as prescribed Patient will attend all scheduled provider appointments Patient will call provider office for new concerns or questions  Follow Up Plan: Telephone follow up appointment with care management team member scheduled for: 05-01-2020 at 11:45 am       Task: RNCM: Identify Depressive Symptoms and Facilitate Treatment   Note:   Care Management Activities:    - anxiety screen reviewed - depression screen reviewed - medication list reviewed       Patient Care Plan: RNCM: Management of HLD    Problem Identified: RNCM: Management of HLD and associated issues related to HLD/Cardiac function   Priority: Medium    Goal: RNCM: Effective management of HLD  Start Date: 03/20/2020  Priority: Medium  Note:   Current Barriers:  Marland Kitchen Knowledge Deficits related to needed follow up for abnormal mammogram with abnormality in right breast. Patient states she needs a referral for follow up and has not heard back from the office  . Care Coordination needs related to need  for follow up for [place] of right breast and new onset of dizziness over the last 3 weeks in a patient with HLD and other Chronic Conditions . Chronic Disease Management support and education needs related to HLD and associated chronic conditions  . Lacks caregiver support.  . Film/video editor.  . Non-adherence to prescribed medication regimen . Difficulty obtaining medications . Unable to independently manage HLD, follow up needs for abnormal mammogram and new onset of dizziness . Unable to self administer medications as prescribed . Does not adhere to provider recommendations re: follow up [place] for abnormal mammogram . Does not adhere to prescribed medication regimen . Lacks social connections . Does not maintain contact with provider office . Does not contact provider office for questions/concerns  Nurse Case Manager Clinical Goal(s):  Marland Kitchen Over the next 120 days, patient will verbalize understanding of plan for HLD and following up with the pcp for dizziness and abnormal mammogram findings . Over the next 120 days, patient will work with [facility], CCM team and pcp to address needs related to medication compliance, expressed needs to be addressed related to abnormal mammogram and new onset of dizziness.  . Over the next 120 days, patient will attend all scheduled medical appointments: next appointment with the pcp on 04-03-2020 but the patient asking if she can be seen sooner.  . Over the next 120 days, patient will demonstrate improved health management independence as evidenced bycompliance with the plan of care established by the provider, cholesterol levels stable and stabilized condition . Over the next 120 days, the patient will demonstrate ongoing self health care management ability as evidenced by having the means to get needed medications, call provider office for new changes or concerns, and work with the CCM team to meet her health and wellness goals.   Interventions:  . 1:1 collaboration  with Valerie Roys, DO regarding development and update of comprehensive plan of care as evidenced by provider attestation and co-signature . Inter-disciplinary care team collaboration (see longitudinal plan of care) . Evaluation of current treatment plan related to HLD  and patient's adherence to plan as established by provider. . Advised patient to write down questions for the pcp to address at next pcp visit, especially concerning new onset of dizziness and abnormal mammogram with suggestion to have further testing of right breast.  . Provided education to patient re: heart healthy diet and adherence. Possiblity of patient having orthostatic hypotension, review of safety- patient had a fall 2 weeks ago without injury. Did not go to be evaluated.  . Reviewed medications with patient and discussed compliance  . Collaborated with pcp and front office staff  regarding the need for a sooner appointment.  . Discussed plans with patient for ongoing care management follow up and provided patient with direct contact information for care management team . Provided patient with HLD educational materials related to how to best control HLD and dietary restrictions through the my chart system . Reviewed scheduled/upcoming provider appointments including: Next pcp appointment on 04-03-2020 at 1:40 pm- inbasket message sent to [facility] admin staff to call the patient and get a sooner appointment.   Patient Goals/Self-Care Activities Over the next  120 days, patient will:  - Patient will self administer medications as prescribed Patient will attend all scheduled provider appointments Patient will call pharmacy for medication refills Patient will call provider office for new concerns or questions  Follow Up Plan: Telephone follow up appointment with care management team member scheduled for: 05-01-2020 at 11:45 am         The patient verbalized understanding of instructions, educational materials, and care plan  provided today and declined offer to receive copy of patient instructions, educational materials, and care plan.   Telephone follow up appointment with care management team member scheduled for: 05-01-2020 at 11:45 am  Noreene Larsson RN, MSN, [facility] Mobile: [phone]

## 2020-03-21 ENCOUNTER — Other Ambulatory Visit: Payer: Self-pay

## 2020-03-21 ENCOUNTER — Telehealth: Payer: Self-pay | Admitting: Pharmacist

## 2020-03-21 ENCOUNTER — Ambulatory Visit (INDEPENDENT_AMBULATORY_CARE_PROVIDER_SITE_OTHER): Payer: Medicare Other | Admitting: Family Medicine

## 2020-03-21 ENCOUNTER — Encounter: Payer: Self-pay | Admitting: Family Medicine

## 2020-03-21 VITALS — BP 122/78 | HR 93 | Temp 99.2°F

## 2020-03-21 DIAGNOSIS — D649 Anemia, unspecified: Secondary | ICD-10-CM

## 2020-03-21 DIAGNOSIS — N182 Chronic kidney disease, stage 2 (mild): Secondary | ICD-10-CM

## 2020-03-21 DIAGNOSIS — R42 Dizziness and giddiness: Secondary | ICD-10-CM

## 2020-03-21 DIAGNOSIS — E1122 Type 2 diabetes mellitus with diabetic chronic kidney disease: Secondary | ICD-10-CM

## 2020-03-21 LAB — GLUCOSE HEMOCUE WAIVED: Glu Hemocue Waived: 428 mg/dL (ref 65–99)

## 2020-03-21 MED ORDER — LISINOPRIL-HYDROCHLOROTHIAZIDE 20-25 MG PO TABS: 1.0000 | ORAL_TABLET | Freq: Every day | ORAL | 1 refills | Status: DC

## 2020-03-21 NOTE — Progress Notes (Signed)
    SUBJECTIVE:   CHIEF COMPLAINT / HPI:   Patient Active Problem List   Diagnosis Date Noted  . Dizziness 03/21/2020  . Senile purpura (HCC) 04/07/2018  . Swelling of lower limb 04/02/2016  . Anemia 08/30/2015  . Diabetes (HCC) 03/28/2015  . 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 Hyperlipidemia 03/28/2015  . Depression 03/28/2015   DIZZINESS - h/o T2DM, last A1c 7.4 12/2019. - occasionally misses trulicity dose, some higher CBGs few weeks ago. - this am CBG 375, has been out of trulicity for 2 weeks, ate cheerios, 433 after.  - taking BP meds.  Duration: 3 weeks Description of symptoms: lightheaded, feels off balance Duration of episode: off and on all day Dizziness frequency: no history of the same Provoking factors: standing Aggravating factors:  standing, movement Triggered by rolling over in bed: no Triggered by bending over: no Aggravated by head movement: no Aggravated by exertion, coughing, loud noises: no Recent head injury: no Recent or current viral symptoms: no History of vasovagal episodes: no Nausea: yes Vomiting: no Tinnitus: no Hearing loss: no Aural fullness: no Headache: no Photophobia/phonophobia: no Unsteady gait: yes Diplopia, dysarthria, dysphagia: no Related to exertion: no Pallor: sometimes Diaphoresis: no Dyspnea: no Chest pain: no   OBJECTIVE:   BP 122/78   Pulse 93   Temp 99.2 F (37.3 C)   SpO2 100%   Gen: elderly, chronically ill appearing. In NAD Card: RRR, no murmur Lungs: CTAB Ext: WWP, no edema MSK: Full ROM, strength 5/5 to UE bilaterally, 4/5 quad strength bilaterally, deep reflexes intact, normal gait.  No edema.  Neuro: Alert and oriented, speech normal.  Optic field normal. PERRL, Extraocular movements intact.  Intact symmetric sensation to light touch of face and extremities bilaterally.  Hearing grossly intact bilaterally.  Tongue protrudes normally with no deviation.  Shoulder shrug, smile symmetric.  Finger to nose normal.  ASSESSMENT/PLAN:   Diabetes (HCC) Worsened, has been out of trulicity. Samples provided today. F/u as scheduled in 2 weeks.   Dizziness Likely multifactorial with higher CBGs as has been out of trulicity the last few weeks, also with +orthostatics today. Neuro exam otherwise wnl. No red flags on history or exam. Trulicity samples provided, working with pharmacy to get financial assistance. Will decrease lisinopril-HCTZ to one tablet daily. Exercises provided for quad strengthening. Will also obtain labs to assess for other contributing factors. F/u as scheduled in 2 weeks.    Caro Laroche, DO

## 2020-03-21 NOTE — Assessment & Plan Note (Addendum)
Likely multifactorial with higher CBGs as has been out of trulicity the last few weeks, also with +orthostatics today. Neuro exam otherwise wnl. No red flags on history or exam. Trulicity samples provided, working with pharmacy to get financial assistance. Will decrease lisinopril-HCTZ to one tablet daily. Exercises provided for quad strengthening. Will also obtain labs to assess for other contributing factors. F/u as scheduled in 2 weeks.

## 2020-03-21 NOTE — Assessment & Plan Note (Signed)
Worsened, has been out of trulicity. Samples provided today. F/u as scheduled in 2 weeks.

## 2020-03-21 NOTE — Telephone Encounter (Signed)
Patient picked up two Trulicity samples today and will administer one dose today. Daughter will bring copy of taxes tomorrow and PAP application will be faxed.

## 2020-03-21 NOTE — Patient Instructions (Addendum)
It was great to see you!  Our plans for today:  - Decrease your blood pressure medication to one tablet daily. - Continue to work with the pharmacist, Raynelle Fanning, on getting patient assistance for your trulicity. - See below for exercises to strengthen your quadricept muscles. - Come back in 1 month.  We are checking some labs today, we will release these results to your MyChart.  Take care and seek immediate care sooner if you develop any concerns.   Dr. Linwood Dibbles    1. Lie flat on your back. Leg and knee to be worked straight, other leg bent 2. Pull your toes towards you and tighten/clench the muscle on the front of the thigh, locking your knee straight. Lift your foot up about 6 inches off the bed. Hold for 3-5 secs and slowly lower 3. Ensure your knee stays straight the whole time 4. Repeat 10-20 times, 2x daily. 5.To work harder, add a weight e.g. by wearing a shoe.    1. Sit in a firm chair, feet on the floor 2. Lean forwards, lift your bottom and stand up straight and then sit back down 3. Repeat 10x  You can make this exercise easier by: a. pushing up through your arms too b. The higher the chair, the easier the exercise  You can make this exercise harder by: a. Not using your arms b. Using a lower chair c. Increasing the speed you do knee strengthening exercises at d. Hold a heavy weight - e.g. bag of books during this knee exercise.

## 2020-03-22 LAB — TSH: TSH: 3.13 u[IU]/mL (ref 0.450–4.500)

## 2020-03-22 LAB — BASIC METABOLIC PANEL
BUN/Creatinine Ratio: 20 (ref 12–28)
BUN: 25 mg/dL (ref 8–27)
CO2: 20 mmol/L (ref 20–29)
Calcium: 9.3 mg/dL (ref 8.7–10.3)
Chloride: 93 mmol/L — ABNORMAL LOW (ref 96–106)
Creatinine, Ser: 1.27 mg/dL — ABNORMAL HIGH (ref 0.57–1.00)
GFR calc Af Amer: 49 mL/min/{1.73_m2} — ABNORMAL LOW (ref >59)
GFR calc non Af Amer: 42 mL/min/{1.73_m2} — ABNORMAL LOW (ref >59)
Glucose: 407 mg/dL — ABNORMAL HIGH (ref 65–99)
Potassium: 4.6 mmol/L (ref 3.5–5.2)
Sodium: 128 mmol/L — ABNORMAL LOW (ref 134–144)

## 2020-03-22 LAB — CBC WITH DIFFERENTIAL
Basophils Absolute: 0 10*3/uL (ref 0.0–0.2)
Basos: 0 %
EOS (ABSOLUTE): 0.1 10*3/uL (ref 0.0–0.4)
Eos: 1 %
Hematocrit: 32.4 % — ABNORMAL LOW (ref 34.0–46.6)
Hemoglobin: 10.7 g/dL — ABNORMAL LOW (ref 11.1–15.9)
Immature Grans (Abs): 0 10*3/uL (ref 0.0–0.1)
Immature Granulocytes: 0 %
Lymphocytes Absolute: 1.4 10*3/uL (ref 0.7–3.1)
Lymphs: 16 %
MCH: 29.4 pg (ref 26.6–33.0)
MCHC: 33 g/dL (ref 31.5–35.7)
MCV: 89 fL (ref 79–97)
Monocytes Absolute: 0.3 10*3/uL (ref 0.1–0.9)
Monocytes: 4 %
Neutrophils Absolute: 6.9 10*3/uL (ref 1.4–7.0)
Neutrophils: 79 %
RBC: 3.64 x10E6/uL — ABNORMAL LOW (ref 3.77–5.28)
RDW: 12.9 % (ref 11.7–15.4)
WBC: 8.7 10*3/uL (ref 3.4–10.8)

## 2020-03-29 ENCOUNTER — Telehealth: Payer: Self-pay | Admitting: Pharmacist

## 2020-03-29 NOTE — Telephone Encounter (Signed)
Patient assistance application for Trulicity faxed to [employer] today.

## 2020-04-03 ENCOUNTER — Encounter: Payer: Self-pay | Admitting: Family Medicine

## 2020-04-03 ENCOUNTER — Other Ambulatory Visit: Payer: Self-pay

## 2020-04-03 ENCOUNTER — Telehealth: Payer: Self-pay

## 2020-04-03 ENCOUNTER — Telehealth (INDEPENDENT_AMBULATORY_CARE_PROVIDER_SITE_OTHER): Payer: Medicare Other | Admitting: Family Medicine

## 2020-04-03 VITALS — BP 140/75 | Wt 154.0 lb

## 2020-04-03 DIAGNOSIS — E1122 Type 2 diabetes mellitus with diabetic chronic kidney disease: Secondary | ICD-10-CM | POA: Diagnosis not present

## 2020-04-03 DIAGNOSIS — D649 Anemia, unspecified: Secondary | ICD-10-CM

## 2020-04-03 DIAGNOSIS — R42 Dizziness and giddiness: Secondary | ICD-10-CM

## 2020-04-03 DIAGNOSIS — N182 Chronic kidney disease, stage 2 (mild): Secondary | ICD-10-CM | POA: Diagnosis not present

## 2020-04-03 DIAGNOSIS — N289 Disorder of kidney and ureter, unspecified: Secondary | ICD-10-CM

## 2020-04-03 NOTE — Telephone Encounter (Signed)
Letter on her mychart

## 2020-04-03 NOTE — Assessment & Plan Note (Signed)
Will recheck labs, including ferritin and TIBC. Await results. Treat as needed.

## 2020-04-03 NOTE — Assessment & Plan Note (Signed)
Has been taking her trulicity faithfully. No change in her symptoms around the trulicity dose. Due in 2 weeks for A1c- given dizziness, will check it now. Await results. Treat as needed.

## 2020-04-03 NOTE — Assessment & Plan Note (Signed)
No better, worsening. Has been going on 6 weeks. Will check labs to see if that is contributing- coming in tomorrow for labs. Concern for CVA, vestibular mass, will obtain MRI w and w/o given length of symptoms and dizziness. Referral to neurology made today as well. Continue to monitor closely

## 2020-04-03 NOTE — Progress Notes (Signed)
BP 140/75   Wt 154 lb (69.9 kg)   BMI 24.86 kg/m    Subjective:    Patient ID: Alison Hernandez, female    DOB: 12/31/1945, 75 y.o.   MRN: [id number]  HPI: Alison Hernandez is a 75 y.o. female  Chief Complaint  Patient presents with  . Diabetes    4 week f/up   DIZZINESS- Dizziness has not gone away. Still very dizzy.  Duration: 6 weeks Description of symptoms: lightheaded Duration of episode: whenever sitting up or standing Dizziness frequency: recurrent Provoking factors: sitting up or standing Aggravating factors:  sitting up or standing Triggered by rolling over in bed: unknown Triggered by bending over: no change Aggravated by head movement: no change Aggravated by exertion, coughing, loud noises: no Recent head injury: no Recent or current viral symptoms: no History of vasovagal episodes: no Nausea: yes Vomiting: yes Tinnitus: yes- for a very short period Hearing loss: no Aural fullness: no Headache: no Photophobia/phonophobia: no Unsteady gait: no Postural instability: no Diplopia, dysarthria, dysphagia or weakness: no Related to exertion: no Pallor: no Diaphoresis: no Dyspnea: no Chest pain: no  Relevant past medical, surgical, family and social history reviewed and updated as indicated. Interim medical history since our last visit reviewed. Allergies and medications reviewed and updated.  Review of Systems  Constitutional: Negative.   HENT: Negative.   Respiratory: Negative.   Cardiovascular: Negative.   Neurological: Positive for dizziness, light-headedness and headaches. Negative for tremors, seizures, syncope, facial asymmetry, speech difficulty, weakness and numbness.  Psychiatric/Behavioral: Negative.     Per HPI unless specifically indicated above     Objective:    BP 140/75   Wt 154 lb (69.9 kg)   BMI 24.86 kg/m   Wt Readings from Last 3 Encounters:  04/03/20 154 lb (69.9 kg)  03/06/20 164 lb 6.4 oz (74.6 kg)  01/17/20 167 lb  (75.8 kg)    Physical Exam Vitals and nursing note reviewed.  Constitutional:      General: She is not in acute distress.    Appearance: Normal appearance. She is not ill-appearing, toxic-appearing or diaphoretic.  HENT:     Head: Normocephalic and atraumatic.     Right Ear: External ear normal.     Left Ear: External ear normal.     Nose: Nose normal.     Mouth/Throat:     Mouth: Mucous membranes are moist.     Pharynx: Oropharynx is clear.  Eyes:     General: No scleral icterus.       Right eye: No discharge.        Left eye: No discharge.     Conjunctiva/sclera: Conjunctivae normal.     Pupils: Pupils are equal, round, and reactive to light.  Pulmonary:     Effort: Pulmonary effort is normal. No respiratory distress.     Comments: Speaking in full sentences Musculoskeletal:        General: Normal range of motion.     Cervical back: Normal range of motion.  Skin:    Coloration: Skin is not jaundiced or pale.     Findings: No bruising, erythema, lesion or rash.  Neurological:     Mental Status: She is alert and oriented to person, place, and time. Mental status is at baseline.  Psychiatric:        Mood and Affect: Mood normal.        Behavior: Behavior normal.        Thought Content: Thought content normal.  Judgment: Judgment normal.     Results for orders placed or performed in visit on 03/21/20  Glucose Hemocue Waived (STAT)  Result Value Ref Range   Glu Hemocue Waived 428 (HH) 65 - 99 mg/dL  TSH  Result Value Ref Range   TSH 3.130 0.450 - 4.500 uIU/mL  CBC With Differential  Result Value Ref Range   WBC 8.7 3.4 - 10.8 x10E3/uL   RBC 3.64 (L) 3.77 - 5.28 x10E6/uL   Hemoglobin 10.7 (L) 11.1 - 15.9 g/dL   Hematocrit 76.2 (L) 83.1 - 46.6 %   MCV 89 79 - 97 fL   MCH 29.4 26.6 - 33.0 pg   MCHC 33.0 31.5 - 35.7 g/dL   RDW 51.7 61.6 - 07.3 %   Neutrophils 79 Not Estab. %   Lymphs 16 Not Estab. %   Monocytes 4 Not Estab. %   Eos 1 Not Estab. %   Basos 0  Not Estab. %   Neutrophils Absolute 6.9 1.4 - 7.0 x10E3/uL   Lymphocytes Absolute 1.4 0.7 - 3.1 x10E3/uL   Monocytes Absolute 0.3 0.1 - 0.9 x10E3/uL   EOS (ABSOLUTE) 0.1 0.0 - 0.4 x10E3/uL   Basophils Absolute 0.0 0.0 - 0.2 x10E3/uL   Immature Granulocytes 0 Not Estab. %   Immature Grans (Abs) 0.0 0.0 - 0.1 x10E3/uL  Basic Metabolic Panel (BMET)  Result Value Ref Range   Glucose 407 (H) 65 - 99 mg/dL   BUN 25 8 - 27 mg/dL   Creatinine, Ser 7.10 (H) 0.57 - 1.00 mg/dL   GFR calc non Af Amer 42 (L) >59 mL/min/1.73   GFR calc Af Amer 49 (L) >59 mL/min/1.73   BUN/Creatinine Ratio 20 12 - 28   Sodium 128 (L) 134 - 144 mmol/L   Potassium 4.6 3.5 - 5.2 mmol/L   Chloride 93 (L) 96 - 106 mmol/L   CO2 20 20 - 29 mmol/L   Calcium 9.3 8.7 - 10.3 mg/dL      Assessment & Plan:   Problem List Items Addressed This Visit      Endocrine   Diabetes (HCC)    Has been taking her trulicity faithfully. No change in her symptoms around the trulicity dose. Due in 2 weeks for A1c- given dizziness, will check it now. Await results. Treat as needed.       Relevant Orders   Comprehensive metabolic panel   Bayer DCA Hb G2I Waived     Other   Anemia    Will recheck labs, including ferritin and TIBC. Await results. Treat as needed.       Relevant Orders   CBC with Differential/Platelet   Iron and TIBC   Ferritin   Dizziness - Primary    No better, worsening. Has been going on 6 weeks. Will check labs to see if that is contributing- coming in tomorrow for labs. Concern for CVA, vestibular mass, will obtain MRI w and w/o given length of symptoms and dizziness. Referral to neurology made today as well. Continue to monitor closely      Relevant Orders   MR Brain W Wo Contrast   Ambulatory referral to Neurology    Other Visit Diagnoses    Abnormal kidney function       Rechecking labs. Await results. Treat as needed.    Relevant Orders   Comprehensive metabolic panel       Follow up  plan: Return in about 2 weeks (around 04/17/2020).    . This visit was completed  via MyChart due to the restrictions of the COVID-19 pandemic. All issues as above were discussed and addressed. Physical exam was done as above through visual confirmation on MyChart. If it was felt that the patient should be evaluated in the office, they were directed there. The patient verbally consented to this visit. . Location of the patient: home . Location of the provider: home . Those involved with this call:  . Provider: Olevia Perches, DO . CMA: Wilhemena Durie, CMA . Front Desk/Registration: Harriet Pho  . Time spent on call: 15 minutes with patient face to face via video conference. More than 50% of this time was spent in counseling and coordination of care. 23 minutes total spent in review of patient's record and preparation of their chart.

## 2020-04-03 NOTE — Telephone Encounter (Signed)
Copied from CRM [id number]. Topic: General - Inquiry >> Apr 03, 2020  2:12 PM Crist Infante wrote: Reason for CRM: pt states she needs a note to return to work.  Pt would like to return to work Thursday, 04/05/20. Pt would like to pick up the work note. Pt would like call back when it is ready.

## 2020-04-04 ENCOUNTER — Other Ambulatory Visit: Payer: Medicare Other

## 2020-04-04 DIAGNOSIS — N289 Disorder of kidney and ureter, unspecified: Secondary | ICD-10-CM

## 2020-04-04 DIAGNOSIS — D649 Anemia, unspecified: Secondary | ICD-10-CM

## 2020-04-04 DIAGNOSIS — E1122 Type 2 diabetes mellitus with diabetic chronic kidney disease: Secondary | ICD-10-CM

## 2020-04-04 DIAGNOSIS — N182 Chronic kidney disease, stage 2 (mild): Secondary | ICD-10-CM | POA: Diagnosis not present

## 2020-04-04 LAB — BAYER DCA HB A1C WAIVED: HB A1C (BAYER DCA - WAIVED): 8.4 % — ABNORMAL HIGH (ref <7.0)

## 2020-04-04 NOTE — Telephone Encounter (Signed)
Called pt she is going to come in and pick up letter

## 2020-04-05 LAB — COMPREHENSIVE METABOLIC PANEL
ALT: 14 IU/L (ref 0–32)
AST: 7 IU/L (ref 0–40)
Albumin/Globulin Ratio: 1.6 (ref 1.2–2.2)
Albumin: 4.5 g/dL (ref 3.7–4.7)
Alkaline Phosphatase: 105 IU/L (ref 44–121)
BUN/Creatinine Ratio: 16 (ref 12–28)
BUN: 20 mg/dL (ref 8–27)
Bilirubin Total: 0.4 mg/dL (ref 0.0–1.2)
CO2: 19 mmol/L — ABNORMAL LOW (ref 20–29)
Calcium: 9.8 mg/dL (ref 8.7–10.3)
Chloride: 96 mmol/L (ref 96–106)
Creatinine, Ser: 1.27 mg/dL — ABNORMAL HIGH (ref 0.57–1.00)
GFR calc Af Amer: 48 mL/min/{1.73_m2} — ABNORMAL LOW (ref >59)
GFR calc non Af Amer: 42 mL/min/{1.73_m2} — ABNORMAL LOW (ref >59)
Globulin, Total: 2.8 g/dL (ref 1.5–4.5)
Glucose: 295 mg/dL — ABNORMAL HIGH (ref 65–99)
Potassium: 4.5 mmol/L (ref 3.5–5.2)
Sodium: 139 mmol/L (ref 134–144)
Total Protein: 7.3 g/dL (ref 6.0–8.5)

## 2020-04-05 LAB — IRON AND TIBC
Iron Saturation: 21 % (ref 15–55)
Iron: 79 ug/dL (ref 27–139)
Total Iron Binding Capacity: 374 ug/dL (ref 250–450)
UIBC: 295 ug/dL (ref 118–369)

## 2020-04-05 LAB — CBC WITH DIFFERENTIAL/PLATELET
Basophils Absolute: 0 10*3/uL (ref 0.0–0.2)
Basos: 0 %
EOS (ABSOLUTE): 0.1 10*3/uL (ref 0.0–0.4)
Eos: 1 %
Hematocrit: 33.3 % — ABNORMAL LOW (ref 34.0–46.6)
Hemoglobin: 11.4 g/dL (ref 11.1–15.9)
Immature Grans (Abs): 0 10*3/uL (ref 0.0–0.1)
Immature Granulocytes: 0 %
Lymphocytes Absolute: 2.1 10*3/uL (ref 0.7–3.1)
Lymphs: 30 %
MCH: 29.7 pg (ref 26.6–33.0)
MCHC: 34.2 g/dL (ref 31.5–35.7)
MCV: 87 fL (ref 79–97)
Monocytes Absolute: 0.5 10*3/uL (ref 0.1–0.9)
Monocytes: 7 %
Neutrophils Absolute: 4.4 10*3/uL (ref 1.4–7.0)
Neutrophils: 62 %
Platelets: 308 10*3/uL (ref 150–450)
RBC: 3.84 x10E6/uL (ref 3.77–5.28)
RDW: 13.8 % (ref 11.7–15.4)
WBC: 7.1 10*3/uL (ref 3.4–10.8)

## 2020-04-05 LAB — FERRITIN: Ferritin: 44 ng/mL (ref 15–150)

## 2020-04-06 ENCOUNTER — Other Ambulatory Visit: Payer: Self-pay

## 2020-04-06 ENCOUNTER — Telehealth: Payer: Self-pay

## 2020-04-06 MED ORDER — ALPRAZOLAM 0.5 MG PO TABS: ORAL_TABLET | ORAL | 0 refills | Status: DC

## 2020-04-06 MED ORDER — TRULICITY 3 MG/0.5ML ~~LOC~~ SOAJ: 3.0000 mg | SUBCUTANEOUS | 4 refills | Status: DC

## 2020-04-06 NOTE — Telephone Encounter (Signed)
rx sent to her pharmacy 

## 2020-04-06 NOTE — Telephone Encounter (Signed)
Copied from CRM [id number]. Topic: General - Inquiry >> Apr 06, 2020  9:10 AM Alison Hernandez D wrote: Reason for CRM: Pt is having a MRI on Tuesday and they told her in imaging that she needs to ask her provider for anxiety  [address]  CB#  [phone]

## 2020-04-06 NOTE — Telephone Encounter (Signed)
Patient notified

## 2020-04-08 ENCOUNTER — Other Ambulatory Visit: Payer: Self-pay | Admitting: Family Medicine

## 2020-04-08 MED ORDER — TRULICITY 4.5 MG/0.5ML ~~LOC~~ SOAJ: 4.5000 mg | SUBCUTANEOUS | 1 refills | Status: DC

## 2020-04-09 ENCOUNTER — Telehealth: Payer: Self-pay | Admitting: Pharmacist

## 2020-04-10 ENCOUNTER — Ambulatory Visit
Admission: RE | Admit: 2020-04-10 | Discharge: 2020-04-10 | Disposition: A | Discharge status: Home / Self Care | Payer: Medicare Other | Source: Ambulatory Visit | Attending: Family Medicine | Admitting: Family Medicine

## 2020-04-10 ENCOUNTER — Other Ambulatory Visit: Payer: Self-pay

## 2020-04-10 DIAGNOSIS — R42 Dizziness and giddiness: Secondary | ICD-10-CM

## 2020-04-10 DIAGNOSIS — G9389 Other specified disorders of brain: Secondary | ICD-10-CM | POA: Diagnosis not present

## 2020-04-10 DIAGNOSIS — J3489 Other specified disorders of nose and nasal sinuses: Secondary | ICD-10-CM | POA: Diagnosis not present

## 2020-04-10 MED ORDER — GADOBUTROL 1 MMOL/ML IV SOLN
7.0000 mL | Freq: Once | INTRAVENOUS | Status: AC | PRN
  Administered 2020-04-10: 7 mL via INTRAVENOUS

## 2020-04-11 NOTE — Progress Notes (Signed)
Patient notified

## 2020-04-16 NOTE — Progress Notes (Addendum)
    Chronic Care Management Pharmacy Assistant   Name: Alison Hernandez  MRN: [id number] DOB: 11-03-1945  Reason for Encounter: Updating Patient Assistance Application/ Trulicity    PCP : Valerie Roys, DO  Allergies:   Allergies  Allergen Reactions   Lovastatin Nausea And Vomiting   Bydureon [Exenatide] Rash    Medications: Outpatient Encounter Medications as of 04/09/2020  Medication Sig   ALPRAZolam (XANAX) 0.5 MG tablet Take 1 30 minutes before MRI and 1 immediately before MRI   Blood Glucose Monitoring Suppl (ONE TOUCH ULTRA 2) w/Device KIT Test 3x a day   buPROPion (WELLBUTRIN XL) 300 MG 24 hr tablet Take 1 tablet (300 mg total) by mouth daily.   Dulaglutide (TRULICITY) 4.5 YH/0.6CB SOPN Inject 4.5 mg as directed once a week.   glucose blood test strip Use as instructed   lisinopril-hydrochlorothiazide (ZESTORETIC) 20-25 MG tablet Take 1 tablet by mouth daily.   meclizine (ANTIVERT) 25 MG tablet Take 1 tablet (25 mg total) by mouth 3 (three) times daily as needed for dizziness.   metFORMIN (GLUCOPHAGE) 1000 MG tablet Take 1 tablet (1,000 mg total) by mouth 2 (two) times daily with a meal.   Multiple Vitamin (MULTIVITAMIN) tablet Take 1 tablet by mouth daily.   NYSTATIN powder APPLY  POWDER TOPICALLY 4 TIMES DAILY   omeprazole (PRILOSEC) 20 MG capsule Take 2 capsules (40 mg total) by mouth daily.   ondansetron (ZOFRAN-ODT) 8 MG disintegrating tablet DISSOLVE 1 TABLET IN MOUTH EVERY 8 HOURS AS NEEDED FOR NAUSEA OR VOMITING   ONE TOUCH ULTRA TEST test strip Test 3x a day   OneTouch Delica Lancets 76E MISC Test 3x a day   rosuvastatin (CRESTOR) 10 MG tablet Take 1 tablet by mouth once daily   No facility-administered encounter medications on file as of 04/09/2020.    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 Updated patient assistance application with requested information. Sent back to [facility] for approval.   Cloretta Ned, LPN Clinical Pharmacist Assistant  [phone]  Patient has been approved for 2022.  Junita Push. Kenton Kingfisher PharmD, BCPS Clinical Pharmacist [facility] [phone]     Follow-Up:  Pharmacist Review

## 2020-04-19 ENCOUNTER — Ambulatory Visit (INDEPENDENT_AMBULATORY_CARE_PROVIDER_SITE_OTHER): Payer: Medicare Other | Admitting: Family Medicine

## 2020-04-19 ENCOUNTER — Other Ambulatory Visit: Payer: Self-pay

## 2020-04-19 ENCOUNTER — Encounter: Payer: Self-pay | Admitting: Family Medicine

## 2020-04-19 VITALS — BP 124/71 | HR 99 | Temp 98.4°F | Wt 155.0 lb

## 2020-04-19 DIAGNOSIS — R42 Dizziness and giddiness: Secondary | ICD-10-CM

## 2020-04-19 DIAGNOSIS — H8112 Benign paroxysmal vertigo, left ear: Secondary | ICD-10-CM | POA: Diagnosis not present

## 2020-04-19 NOTE — Progress Notes (Signed)
BP 124/71   Pulse 99   Temp 98.4 F (36.9 C)   Wt 155 lb (70.3 kg)   SpO2 100%   BMI 25.02 kg/m    Subjective:    Patient ID: Alison Hernandez, female    DOB: 06-Jul-1945, 75 y.o.   MRN: [id number]  HPI: Alison Hernandez is a 75 y.o. female  Chief Complaint  Patient presents with  . Dizziness    Patient here to follow up on dizziness. States it is about the same.    Dizziness is not getting any worse. She was able to go to work for 3 hours yesterday. She notes that some days she's good and does not get dizzy. Not getting worse, but she is not getting better. She has had an MRI, which thankfully looked good. She notes that the dizziness is lightheadedness and happens whenever she is not laying down. Nothing really makes it better or worse. She is otherwise feeling well with no other concerns or complaints at this time.    Relevant past medical, surgical, family and social history reviewed and updated as indicated. Interim medical history since our last visit reviewed. Allergies and medications reviewed and updated.  Review of Systems  HENT: Negative.   Respiratory: Negative.   Cardiovascular: Negative.   Gastrointestinal: Negative.   Musculoskeletal: Negative.   Neurological: Positive for dizziness, tremors and light-headedness. Negative for seizures, syncope, facial asymmetry, speech difficulty, weakness, numbness and headaches.  Psychiatric/Behavioral: Negative.     Per HPI unless specifically indicated above     Objective:    BP 124/71   Pulse 99   Temp 98.4 F (36.9 C)   Wt 155 lb (70.3 kg)   SpO2 100%   BMI 25.02 kg/m   Wt Readings from Last 3 Encounters:  04/19/20 155 lb (70.3 kg)  04/03/20 154 lb (69.9 kg)  03/06/20 164 lb 6.4 oz (74.6 kg)    Physical Exam Vitals and nursing note reviewed.  Constitutional:      General: She is not in acute distress.    Appearance: Normal appearance. She is not ill-appearing, toxic-appearing or diaphoretic.  HENT:      Head: Normocephalic and atraumatic.     Right Ear: External ear normal.     Left Ear: External ear normal.     Nose: Nose normal.     Mouth/Throat:     Mouth: Mucous membranes are moist.     Pharynx: Oropharynx is clear.  Eyes:     General: No scleral icterus.       Right eye: No discharge.        Left eye: No discharge.     Extraocular Movements: Extraocular movements intact.     Right eye: Normal extraocular motion and no nystagmus.     Left eye: Nystagmus present. Normal extraocular motion.     Conjunctiva/sclera: Conjunctivae normal.     Pupils: Pupils are equal, round, and reactive to light.  Cardiovascular:     Rate and Rhythm: Normal rate and regular rhythm.     Pulses: Normal pulses.     Heart sounds: Normal heart sounds. No murmur heard. No friction rub. No gallop.   Pulmonary:     Effort: Pulmonary effort is normal. No respiratory distress.     Breath sounds: Normal breath sounds. No stridor. No wheezing, rhonchi or rales.  Chest:     Chest wall: No tenderness.  Musculoskeletal:        General: Normal range of motion.  Cervical back: Normal range of motion and neck supple.  Skin:    General: Skin is warm and dry.     Capillary Refill: Capillary refill takes less than 2 seconds.     Coloration: Skin is not jaundiced or pale.     Findings: No bruising, erythema, lesion or rash.  Neurological:     General: No focal deficit present.     Mental Status: She is alert and oriented to person, place, and time. Mental status is at baseline.  Psychiatric:        Mood and Affect: Mood normal.        Behavior: Behavior normal.        Thought Content: Thought content normal.        Judgment: Judgment normal.     Results for orders placed or performed in visit on 04/04/20  Ferritin  Result Value Ref Range   Ferritin 44 15 - 150 ng/mL  Iron and TIBC  Result Value Ref Range   Total Iron Binding Capacity 374 250 - 450 ug/dL   UIBC 644 034 - 742 ug/dL   Iron 79 27 - 595  ug/dL   Iron Saturation 21 15 - 55 %  CBC with Differential/Platelet  Result Value Ref Range   WBC 7.1 3.4 - 10.8 x10E3/uL   RBC 3.84 3.77 - 5.28 x10E6/uL   Hemoglobin 11.4 11.1 - 15.9 g/dL   Hematocrit 63.8 (L) 75.6 - 46.6 %   MCV 87 79 - 97 fL   MCH 29.7 26.6 - 33.0 pg   MCHC 34.2 31.5 - 35.7 g/dL   RDW 43.3 29.5 - 18.8 %   Platelets 308 150 - 450 x10E3/uL   Neutrophils 62 Not Estab. %   Lymphs 30 Not Estab. %   Monocytes 7 Not Estab. %   Eos 1 Not Estab. %   Basos 0 Not Estab. %   Neutrophils Absolute 4.4 1.4 - 7.0 x10E3/uL   Lymphocytes Absolute 2.1 0.7 - 3.1 x10E3/uL   Monocytes Absolute 0.5 0.1 - 0.9 x10E3/uL   EOS (ABSOLUTE) 0.1 0.0 - 0.4 x10E3/uL   Basophils Absolute 0.0 0.0 - 0.2 x10E3/uL   Immature Granulocytes 0 Not Estab. %   Immature Grans (Abs) 0.0 0.0 - 0.1 x10E3/uL  Bayer DCA Hb A1c Waived  Result Value Ref Range   HB A1C (BAYER DCA - WAIVED) 8.4 (H) <7.0 %  Comprehensive metabolic panel  Result Value Ref Range   Glucose 295 (H) 65 - 99 mg/dL   BUN 20 8 - 27 mg/dL   Creatinine, Ser 4.16 (H) 0.57 - 1.00 mg/dL   GFR calc non Af Amer 42 (L) >59 mL/min/1.73   GFR calc Af Amer 48 (L) >59 mL/min/1.73   BUN/Creatinine Ratio 16 12 - 28   Sodium 139 134 - 144 mmol/L   Potassium 4.5 3.5 - 5.2 mmol/L   Chloride 96 96 - 106 mmol/L   CO2 19 (L) 20 - 29 mmol/L   Calcium 9.8 8.7 - 10.3 mg/dL   Total Protein 7.3 6.0 - 8.5 g/dL   Albumin 4.5 3.7 - 4.7 g/dL   Globulin, Total 2.8 1.5 - 4.5 g/dL   Albumin/Globulin Ratio 1.6 1.2 - 2.2   Bilirubin Total 0.4 0.0 - 1.2 mg/dL   Alkaline Phosphatase 105 44 - 121 IU/L   AST 7 0 - 40 IU/L   ALT 14 0 - 32 IU/L      Assessment & Plan:   Problem List Items Addressed This  Visit      Other   Dizziness - Primary    Starting to get slightly better. MRI was normal. Will get in for PT. She has an appointment with neurology soon. Continue to monitor. Call with any concerns.       Relevant Orders   Ambulatory referral to  Physical Therapy    Other Visit Diagnoses    Benign paroxysmal positional vertigo of left ear       Will get her into PT to try to help if there is a vertiginous component.    Relevant Orders   Ambulatory referral to Physical Therapy       Follow up plan: Return in about 6 weeks (around 05/31/2020).

## 2020-04-22 NOTE — Assessment & Plan Note (Signed)
Starting to get slightly better. MRI was normal. Will get in for PT. She has an appointment with neurology soon. Continue to monitor. Call with any concerns.

## 2020-04-23 ENCOUNTER — Ambulatory Visit: Payer: Self-pay | Admitting: Pharmacist

## 2020-04-23 DIAGNOSIS — E782 Mixed hyperlipidemia: Secondary | ICD-10-CM

## 2020-04-23 DIAGNOSIS — I129 Hypertensive chronic kidney disease with stage 1 through stage 4 chronic kidney disease, or unspecified chronic kidney disease: Secondary | ICD-10-CM

## 2020-04-23 DIAGNOSIS — E1122 Type 2 diabetes mellitus with diabetic chronic kidney disease: Secondary | ICD-10-CM

## 2020-04-23 NOTE — Patient Instructions (Addendum)
Visit Information  It was a pleasure speaking with you today. Thank you for letting me be part of your clinical team. Please call with any questions or concerns.   Goals Addressed              This Visit's Progress   .  PharmD "I want to stay healthy (pt-stated)        CARE PLAN ENTRY (see longtitudinal plan of care for additional care plan information)  Current Barriers:  . Diabetes: controlled, most recent A1c 8.4%  o Frustrated with A1c. Denies missing doses. Does note that she has reduced portion sizes and has lost >40 lbs. . Current antihyperglycemic regimen: metformin 3300 mg BID, Trulicity 3 mg weekly. Patient assistance approved for Trulicity Will send dose increase request to 4.5 mg to PAP> Most recent eGFR 76m/min by CKD-epi equation. o Hx Scr increase w/ Jardiance; consider restart. o Hx rash w/ Bydureon . Denies any issues w/ hypoglycemia . Current glucose readings:  o Not checking d/t tremors . Cardiovascular risk reduction: o Current hypertensive regimen: lisinopril 20/25 mg; 1 tablet daily; BP at goal <130/80 o Current hyperlipidemia regimen: rosuvastatin 10 mg daily; LDL at goal ~70 Main concern today is ongoing hand tremor and dizziness. Reports symptoms ~ 2 months and unable to work, drive or even check BG. Brain MRI negative. Neuro appt 3/03. Waiting for PT to call and schedule.Trouble paying bills. Referral placed to care guides for assistance. Will attempt to secure CGM for patient at no charge . Depression: bupropion XL 300 mg daily  Max dose for renal function is 150 mg qd. High incidence ~21/26% of tremor and dizziness. Will discuss discontinuation taper with PCP. .Marland KitchenArthritis pain: reports worst in her hands. Tried diclofenac gel with no benefit. Not currently using any OTC pain relief.  Pharmacist Clinical Goal(s):  .Marland KitchenOver the next 90 days, patient will work with PharmD and primary care provider to address optimized medication management and medication access  concerns  Interventions: . Comprehensive medication review performed, medication list updated in electronic medical record . Inter-disciplinary care team collaboration (see longitudinal plan of care) . Will initiate PAP paperwork for increased Trulicity dose . Will investigate options for CGM  . Referral to care guide for financial assistance with utilities and food while patient unable to work.  Patient Self Care Activities:  . Patient will check blood glucose as able  document, and provide at future appointments . Patient will take medications as prescribed . Patient to complete required portion of PAP application and return . Patient will report any questions or concerns to provider   Please see past updates related to this goal by clicking on the "Past Updates" button in the selected goal         The patient verbalized understanding of instructions, educational materials, and care plan provided today and agreed to receive a mailed copy of patient instructions, educational materials, and care plan.   Telephone follow up appointment with pharmacy team member scheduled for: 2 months  JJunita Push HKenton KingfisherPharmD, BCPS Clinical Pharmacist [phone] Diabetes Mellitus and Sick Day Management Blood sugar (glucose) can be difficult to control when you are sick. Common illnesses that can cause problems for people with diabetes (diabetes mellitus) include colds, fever, flu (influenza), nausea, vomiting, and diarrhea. These illnesses can cause stress and loss of body fluids (dehydration), and those issues can cause blood glucose levels to increase. Because of this, it is very important to take your insulin and  diabetes medicines and eat some form of carbohydrate when you are sick. You should make a plan for days when you are sick (sick day plan) as part of your diabetes management plan. You and your health care provider should make this plan in advance. The following guidelines are intended to  help you manage an illness that lasts for about 24 hours or less. Your health care provider may also give you more specific instructions. How to manage your blood glucose  Check your blood glucose every 2-4 hours, or as often as told by your health care provider.  If you use insulin, take your usual dose. If your blood glucose continues to be too high, you may need to take an additional insulin dose as told by your health care provider.  Know your sick day treatment goals. Your target blood glucose levels may be different when you are sick.  If you use oral diabetes medicine, continue to take your medicines. Have a plan with your health care provider for these medicines while you are sick.  If you use injectable hormone medicines other than insulin to control your diabetes, have a plan with your health care provider for these medicines while you are sick.   Follow these instructions at home Check your ketones  If you have type 1 diabetes, check your urine ketones every 4 hours.  If you have type 2 diabetes, check your urine ketones as often as told by your health care provider. Eating and drinking  Drink enough fluid to keep your urine pale yellow. This is especially important if you have a fever, vomiting, or diarrhea. Those symptoms can lead to dehydration.  Follow instructions from your health care provider about beverages to avoid.  Do not drink alcohol, caffeine, or drinks that contain a lot of sugar.  You need to eat some form of carbohydrates when you are sick. Eat 45-50 grams (45-50 g) of carbohydrates every 3-4 hours until you feel better. All of the food choices below contain about 15 g of carbohydrates. Plan ahead and keep some of these foods around so you have them if you get sick. ? 4-6 oz (120-177 mL) carbonated beverage that contains sugar, such as regular (not diet) soda. You may be able to drink carbonated beverages more easily if you open the beverage and let it sit at  room temperature for a few minutes before drinking. ?  of a twin frozen ice pop. ? 4 oz (120 g) regular gelatin. ? 4 oz (120 mL) fruit juice. ? 4 oz (120 g) ice cream or frozen yogurt. ? 2 oz (60 g) sherbet. ? 1 slice bread or toast. ? 6 saltine crackers. ? 5 vanilla wafers. Medicines  Take-over-the-counter and prescription medicines only as told by your health care provider.  Check medicine labels for added sugars. Some medicines may contain sugar or types of sugars that can raise your blood glucose level. Questions to ask your health care provider  Should I adjust my diabetes medicines?  How often do I need to check my blood glucose?  What supplies do I need to manage my diabetes at home when I am sick?  What number can I call if I have questions?  What foods and drinks should I avoid? Contact a health care provider if:  You have been sick or have had a fever for 2 days or longer and you are not getting better.  Your blood glucose is at or above 240 mg/dl (13.3 mmol/L), even after  you take an additional insulin dose.  You are unable to drink fluids without vomiting.  You have any of the following for more than 6 hours: ? Nausea. ? Vomiting. ? Diarrhea. Get help right away if:  You have difficulty breathing.  You have moderate or high ketone levels in your urine.  You have a change in how you think, feel, or act (mental status).  You develop symptoms of diabetic ketoacidosis. These include: ? Nausea. ? Vomiting. ? Excessive thirst. ? Excessive urination. ? Fruity or sweet smelling breath. ? Rapid breathing. ? Pain in the abdomen.  Your blood glucose is lower than 60m/dl (3.0 mmol/L).  You used emergency glucagon to treat low blood glucose. These symptoms may represent a serious problem that is an emergency. Do not wait to see if the symptoms will go away. Get medical help right away. Call your local emergency services (911 in the [place] Do not drive  yourself to the hospital. Summary  Blood sugar (glucose) can be difficult to control when you are sick. Common illnesses that can cause problems for people with diabetes (diabetes mellitus) include colds, fever, flu (influenza), nausea, vomiting, and diarrhea.  Illnesses can cause stress and loss of body fluids (dehydration), and those issues can cause blood glucose levels to increase.  Make a plan for days when you are sick (sick day plan) as part of your diabetes management plan. You and your health care provider should make this plan in advance.  It is very important to take your insulin and diabetes medicines and to eat some form of carbohydrate when you are sick.  Contact your health care provider if have problems managing your blood glucose levels when you are sick, or if you have been sick or had a fever for 2 days or longer and are not getting better. This information is not intended to replace advice given to you by your health care provider. Make sure you discuss any questions you have with your health care provider. Document Revised: 03/31/2019 Document Reviewed: 03/31/2019 Elsevier Patient Education  2021 [employer]

## 2020-04-23 NOTE — Chronic Care Management (AMB) (Signed)
Chronic Care Management Pharmacy  Name: Alison Hernandez  MRN: [id number] DOB: 13-Nov-1945   Chief Complaint/ HPI  Alison Hernandez,  75 y.o. , female presents for her Follow-Up CCM visit with the clinical pharmacist via telephone.  PCP : Alison Roys, DO Patient Care Team: Alison Roys, DO as PCP - General (Family Medicine) Alison Ade, MD as Consulting Physician (Orthopedic Surgery) Alison Hernandez, [place] as Consulting Physician (Podiatry) Alison Ingles, RN as Registered Nurse (General Practice)  Patient's chronic conditions include: Hypertension, Hyperlipidemia, Diabetes, Chronic Kidney Disease, Depression and Osteoarthritis   Office Visits: 04/19/20- Dr. Wynetta Emery- dizziness- ?vertigo- PT referral, MRI normal 04/04/20- Dr. Wynetta Emery- blood work, dizziness-MRI, neuro referral-- Trulicity increased to 4.5 mg qweek  Consult Visit: 04/10/20- [place]- brain MRI w & w/o contrast- no infarction or ICH     Objective: Allergies  Allergen Reactions  . Lovastatin Nausea And Vomiting  . Bydureon [Exenatide] Rash    Medications: Outpatient Encounter Medications as of 04/23/2020  Medication Sig  . Blood Glucose Monitoring Suppl (ONE TOUCH ULTRA 2) w/Device KIT Test 3x a day  . buPROPion (WELLBUTRIN XL) 300 MG 24 hr tablet Take 1 tablet (300 mg total) by mouth daily.  . Dulaglutide (TRULICITY) 4.5 JQ/4.9EE SOPN Inject 4.5 mg as directed once a week.  Marland Kitchen glucose blood test strip Use as instructed  . lisinopril-hydrochlorothiazide (ZESTORETIC) 20-25 MG tablet Take 1 tablet by mouth daily.  . meclizine (ANTIVERT) 25 MG tablet Take 1 tablet (25 mg total) by mouth 3 (three) times daily as needed for dizziness.  . metFORMIN (GLUCOPHAGE) 1000 MG tablet Take 1 tablet (1,000 mg total) by mouth 2 (two) times daily with a meal.  . Multiple Vitamin (MULTIVITAMIN) tablet Take 1 tablet by mouth daily.  . NYSTATIN powder APPLY  POWDER TOPICALLY 4 TIMES DAILY  . omeprazole (PRILOSEC) 20 MG capsule  Take 2 capsules (40 mg total) by mouth daily.  . ondansetron (ZOFRAN-ODT) 8 MG disintegrating tablet DISSOLVE 1 TABLET IN MOUTH EVERY 8 HOURS AS NEEDED FOR NAUSEA OR VOMITING  . OneTouch Delica Lancets 10O MISC Test 3x a day  . rosuvastatin (CRESTOR) 10 MG tablet Take 1 tablet by mouth once daily  . ONE TOUCH ULTRA TEST test strip Test 3x a day   No facility-administered encounter medications on file as of 04/23/2020.    Wt Readings from Last 3 Encounters:  04/19/20 155 lb (70.3 kg)  04/03/20 154 lb (69.9 kg)  03/06/20 164 lb 6.4 oz (74.6 kg)    Lab Results  Component Value Date   CREATININE 1.27 (H) 04/04/2020   BUN 20 04/04/2020   GFRNONAA 42 (L) 04/04/2020   GFRAA 48 (L) 04/04/2020   NA 139 04/04/2020   K 4.5 04/04/2020   CALCIUM 9.8 04/04/2020   CO2 19 (L) 04/04/2020     Current Diagnosis/Assessment:  SDOH Interventions   Flowsheet Row Most Recent Value  SDOH Interventions   Financial Strain Interventions --  [patient assistance, c3 referral placed for assistance with utilites and food]      Goals Addressed              This Visit's Progress   .  PharmD "I want to stay healthy (pt-stated)        CARE PLAN ENTRY (see longtitudinal plan of care for additional care plan information)  Current Barriers:  . Diabetes: controlled, most recent A1c 8.4%  o Frustrated with A1c. Denies missing doses. Does note that she has reduced  portion sizes and has lost >40 lbs. . Current antihyperglycemic regimen: metformin 1007 mg BID, Trulicity 3 mg weekly. Patient assistance approved for Trulicity Will send dose increase request to 4.5 mg to PAP> Most recent eGFR 49m/min by CKD-epi equation. o Hx Scr increase w/ Jardiance; consider restart. o Hx rash w/ Bydureon . Denies any issues w/ hypoglycemia . Current glucose readings:  o Not checking d/t tremors . Cardiovascular risk reduction: o Current hypertensive regimen: lisinopril 20/25 mg; 1 tablet daily; BP at goal  <130/80 o Current hyperlipidemia regimen: rosuvastatin 10 mg daily; LDL at goal ~70 Main concern today is ongoing hand tremor and dizziness. Reports symptoms ~ 2 months and unable to work, drive or even check BG. Brain MRI negative. Neuro appt 3/03. Waiting for PT to call and schedule.Trouble paying bills. Referral placed to care guides for assistance. Will attempt to secure CGM for patient at no charge . Depression: bupropion XL 300 mg daily  Max dose for renal function is 150 mg qd. High incidence ~21/26% of tremor and dizziness. Will discuss discontinuation taper with PCP. .Marland KitchenArthritis pain: reports worst in her hands. Tried diclofenac gel with no benefit. Not currently using any OTC pain relief.  Pharmacist Clinical Goal(s):  .Marland KitchenOver the next 90 days, patient will work with PharmD and primary care provider to address optimized medication management and medication access concerns  Interventions: . Comprehensive medication review performed, medication list updated in electronic medical record . Inter-disciplinary care team collaboration (see longitudinal plan of care) . Will initiate PAP paperwork for increased Trulicity dose . Will investigate options for CGM  . Referral to care guide for financial assistance with utilities and food while patient unable to work.  Patient Self Care Activities:  . Patient will check blood glucose as able  document, and provide at future appointments . Patient will take medications as prescribed . Patient to complete required portion of PAP application and return . Patient will report any questions or concerns to provider   Please see past updates related to this goal by clicking on the "Past Updates" button in the selected goal         Diabetes   A1c goal <7%  Recent Relevant Labs: Lab Results  Component Value Date/Time   HGBA1C 8.4 (H) 04/04/2020 08:36 AM   HGBA1C 7.4 (H) 01/17/2020 11:07 AM   HGBA1C 7.5 11/28/2015 12:00 AM   MICROALBUR 30 (H)  05/19/2019 11:07 AM   MICROALBUR 80 (H) 11/18/2018 01:15 PM    BMP Latest Ref Rng & Units 04/04/2020 03/21/2020 10/18/2019  Glucose 65 - 99 mg/dL 295(H) 407(H) 138(H)  BUN 8 - 27 mg/dL 20 25 28(H)  Creatinine 0.57 - 1.00 mg/dL 1.27(H) 1.27(H) 1.20(H)  BUN/Creat Ratio 12 - 28 16 20 23   Sodium 134 - 144 mmol/L 139 128(L) 136  Potassium 3.5 - 5.2 mmol/L 4.5 4.6 4.5  Chloride 96 - 106 mmol/L 96 93(L) 101  CO2 20 - 29 mmol/L 19(L) 20 23  Calcium 8.7 - 10.3 mg/dL 9.8 9.3 9.3  eGFR ~ 45 ml/min ckd- epi calculator  Last diabetic Eye exam: No results found for: HMDIABEYEEXA  Last diabetic Foot exam: No results found for: HMDIABFOOTEX   Checking BG: not checking  Patient has failed these meds in past: Jardiance Scr increase, Bydureon -rash Patient is currently uncontrolled on the following medications: . Trulicity 3.0 mg q week . Metformin 1000 mg bid  We discussed: Patient denies any missed Trulicity doses, dietary indiscretions and has lost  weight. She is frustrated at her increased A1c.Patient assistance has been approved. Patient has not received shipment took last dose of Trulicity on Saturday. Will ask CPA to follow up. Patient reports she has not been checking her BG for ~ 2 months due to dizziness and tremor. She has been unable to work and has become very limited in her daily activities due to dizziness. Neuro appt  05/24/20 was the earliest she could get. She can not afford to pick up increased Trulicity dose at pharmacy. Will put in referral for community care guides. Will complete dosage increase form and send to patient assistance. Consider rechallenge with  SGLT2i  Renal protection and a1c reduction. eGFR borderline for metformin dosing. Will research options for helping patient obtain CGM due to inability to check. Patient has not been contacted by PT.   Plan Recommend continue Trulicity at 3.0 mg until increased dose approved through patient assistance. Consider rechallenge with  SGLT2i. Adjust metformin dose if eGFr remains </=35m/min at next check.   Hypertension/CKD   BP goal is:  <130/80  Office blood pressures are  BP Readings from Last 3 Encounters:  04/19/20 124/71  04/03/20 140/75  03/21/20 122/78   BMP Latest Ref Rng & Units 04/04/2020 03/21/2020 10/18/2019  Glucose 65 - 99 mg/dL 295(H) 407(H) 138(H)  BUN 8 - 27 mg/dL 20 25 28(H)  Creatinine 0.57 - 1.00 mg/dL 1.27(H) 1.27(H) 1.20(H)  BUN/Creat Ratio 12 - 28 16 20 23   Sodium 134 - 144 mmol/L 139 128(L) 136  Potassium 3.5 - 5.2 mmol/L 4.5 4.6 4.5  Chloride 96 - 106 mmol/L 96 93(L) 101  CO2 20 - 29 mmol/L 19(L) 20 23  Calcium 8.7 - 10.3 mg/dL 9.8 9.3 9.3  eGFR~ 45 ml/min  Patient has failed these meds in the past: NA Patient is currently controlled on the following medications:  . Lisinopril/HCTZ 20/25 mg qd  We discussed Diet and exercise. Reviewed renal function.   Plan  Continue current medications     Hyperlipidemia   LDL goal < 70  Last lipids Lab Results  Component Value Date   CHOL 137 10/18/2019   HDL 47 10/18/2019   LDLCALC 65 10/18/2019   TRIG 147 10/18/2019   Hepatic Function Latest Ref Rng & Units 04/04/2020 10/18/2019 05/19/2019  Total Protein 6.0 - 8.5 g/dL 7.3 6.8 7.0  Albumin 3.7 - 4.7 g/dL 4.5 4.4 4.2  AST 0 - 40 IU/L 7 12 12   ALT 0 - 32 IU/L 14 8 9   Alk Phosphatase 44 - 121 IU/L 105 79 98  Total Bilirubin 0.0 - 1.2 mg/dL 0.4 0.4 0.4     The 10-year ASCVD risk score (Mikey BussingDC Jr., et al., 2013) is: 27.6%   Values used to calculate the score:     Age: [age]years     Sex: Female     Is Non-Hispanic African American: No     Diabetic: Yes     Tobacco smoker: No     Systolic Blood Pressure: 1944mmHg     Is BP treated: Yes     HDL Cholesterol: 47 mg/dL     Total Cholesterol: 137 mg/dL   Patient has failed these meds in past: NA Patient is currently controlled on the following medications:  . Rosuvastatin 10 mg qd  We discussed:  Diet and exercise.   Plan  Continue current medications and control with diet. Depression / Anxiety   PHQ9 Score:  PHQ9 SCORE ONLY 04/19/2020 01/17/2020 11/21/2019  PHQ-9  Total Score 0 0 0   GAD7 Score: GAD 7 : Generalized Anxiety Score 01/17/2020 04/25/2015  Nervous, Anxious, on Edge 0 0  Control/stop worrying 0 0  Worry too much - different things 0 0  Trouble relaxing 0 0  Restless 0 0  Easily annoyed or irritable 0 0  Afraid - awful might happen 0 0  Total GAD 7 Score 0 0  Anxiety Difficulty Not difficult at all Somewhat difficult    Patient has failed these meds in past: NA Patient is currently controlled on the following medications:  . Bupropion XL 300 mg qd  We discussed:  Patient is on dose above recommendation for renal function. Bupropion has relatively high incidence of dizziness and tremor reported. Will discuss with PCP.  Plan  Recommend tapering off bupropion and change to SSRI (escitalopram or sertraline) if needed.  Osteopenia / Osteoporosis   Last DEXA Scan: No record available   No results found for: VD25OH   Patient has failed these meds in past: NA Patient is currently  on the following medications:  . NONE  We discussed:  Recommend [id number] units of vitamin D daily. Recommend 1200 mg of calcium daily from dietary and supplemental sources. Recommend weight-bearing and muscle strengthening exercises for building and maintaining bone density. Recommend patient schedule DEXA as soon as possible after resolution of dizziness/tremor. Patient has fallen 3 times in the last 2 months.  Plan  Recommend DEXA scan. Recommend vitamin d level with next labs. Recommend calcium and vitamin d supplementation 545m/400iu bid.   Patient has fallen 3 times in 2 months.    Medication Management   [facility]:  [facility] [place] [address] [address]Phone: [phone]:  [phone]   Plan  Continue current medication management strategy    Follow up: 2 month phone visit  JJunita Push HKenton KingfisherPharmD, BCambridgeMMemorial Hermann [facility]opedic And [phone]

## 2020-04-24 ENCOUNTER — Telehealth: Payer: Self-pay | Admitting: Pharmacist

## 2020-04-24 ENCOUNTER — Ambulatory Visit: Payer: Medicare Other | Admitting: Family Medicine

## 2020-04-27 NOTE — Progress Notes (Addendum)
Chronic Care Management Pharmacy Assistant   Name: Alison Hernandez  MRN: [id number] DOB: [birth year]  Reason for Encounter: Patient Assistance Coordination     PCP : Valerie Roys, DO  Allergies:   Allergies  Allergen Reactions   Lovastatin Nausea And Vomiting   Bydureon [Exenatide] Rash    Medications: Outpatient Encounter Medications as of 04/24/2020  Medication Sig   Blood Glucose Monitoring Suppl (ONE TOUCH ULTRA 2) w/Device KIT Test 3x a day   buPROPion (WELLBUTRIN XL) 300 MG 24 hr tablet Take 1 tablet (300 mg total) by mouth daily.   Dulaglutide (TRULICITY) 4.5 GG/2.6RS SOPN Inject 4.5 mg as directed once a week.   glucose blood test strip Use as instructed   lisinopril-hydrochlorothiazide (ZESTORETIC) 20-25 MG tablet Take 1 tablet by mouth daily.   meclizine (ANTIVERT) 25 MG tablet Take 1 tablet (25 mg total) by mouth 3 (three) times daily as needed for dizziness.   metFORMIN (GLUCOPHAGE) 1000 MG tablet Take 1 tablet (1,000 mg total) by mouth 2 (two) times daily with a meal.   Multiple Vitamin (MULTIVITAMIN) tablet Take 1 tablet by mouth daily.   NYSTATIN powder APPLY  POWDER TOPICALLY 4 TIMES DAILY   omeprazole (PRILOSEC) 20 MG capsule Take 2 capsules (40 mg total) by mouth daily.   ondansetron (ZOFRAN-ODT) 8 MG disintegrating tablet DISSOLVE 1 TABLET IN MOUTH EVERY 8 HOURS AS NEEDED FOR NAUSEA OR VOMITING   ONE TOUCH ULTRA TEST test strip Test 3x a day   OneTouch Delica Lancets 85I MISC Test 3x a day   rosuvastatin (CRESTOR) 10 MG tablet Take 1 tablet by mouth once daily   No facility-administered encounter medications on file as of 04/24/2020.    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 PharmD requested for CPA to perform research to find other glucose monitors that would be easier for the patient to use. The 2 options that were found were Dexacom and FreeStyle Libre.   04-25-2020: Attempted to speak to the patient related to the diabetic monitors that may be easier for her to use with her tremors. No answer. Left HIPAA complaint voicemail requesting a callback.   05-07-2020: The patient called explaining she has been out of her Trulicity medication for about 2 weeks now. She reported she has not been experiencing any hyperglycemic symptoms, but also not checking her blood sugars due to her machine being at work. She also explained that she is not needing a continuous blood glucose monitor due to her "tremors stopping". She explained "they just stopped overnight, but going to keep my neurologist appointment that is scheduled for the first week of March."   As far as her Trulicity medication, the patient has applied for patient assistance with [employer]. Explained to the patient I would reach out to [facility] and follow back up with her.   [place] who explained they are needing an updated prescription form. Gave the call center representative the dates of the four times the application has been faxed with requested information. The representative also explained they do  not provide emergency vouchers or expedite any applications. She requested for the patient's prescription to be faxed back to [facility] and to write on the cover sheet the patient is out of medications. Called the patient back and explained the above. She verbalized understanding of all information and was appreciative to [place] for assisting her in receiving her medications.   Stressed the importance of following a diabetic diet and monitoring her blood sugars more frequently  due to being out of her Trulicity. Also instructed the patient to reach back out she has any questions or concerns. Again the patient verbalized understanding of the information given. Made PharmD Birdena Crandall aware of the above situation. PharmD refaxed the patient's application to [facility] as requested.   Cloretta Ned, LPN Clinical Pharmacist Assistant  [phone]    Follow-Up:  Pharmacist Review  I have reviewed the care management and care coordination activities outlined in this encounter and I am certifying that I agree with the content of this note.  Junita Push. Kenton Kingfisher PharmD, [facility] [phone]

## 2020-05-01 ENCOUNTER — Telehealth: Payer: Self-pay

## 2020-05-01 NOTE — Chronic Care Management (AMB) (Signed)
  Care Management   Note  05/01/2020 Name: Alison Hernandez MRN: [id number] DOB: Sep 08, 1945  Alison Hernandez is a 76 y.o. year old female who is a primary care patient of Dorcas Carrow, DO and is actively engaged with the care management team. I reached out to Caren Hazy by phone today to assist with re-scheduling a follow up visit with the RN Case Manager  Follow up plan: Unsuccessful telephone outreach attempt made. A HIPAA compliant phone message was left for the patient providing contact information and requesting a return call.  The care management team will reach out to the patient again over the next 7 days.  If patient returns call to provider office, please advise to call Embedded Care Management Care Guide Penne Lash  at [phone]  Penne Lash, RMA Care Guide, Embedded Care Coordination [facility]  [place], [place] [address] Direct Dial: [phone] [email or web address]: [email or web address]

## 2020-05-03 ENCOUNTER — Telehealth: Payer: Self-pay

## 2020-05-03 NOTE — Telephone Encounter (Signed)
Called patient, spoke to daughter Victorino Dike regarding recent mammogram. Patient daughter states they are aware to call and schedule appointment for diagnostic imaging.

## 2020-05-07 ENCOUNTER — Telehealth: Payer: Self-pay | Admitting: Family Medicine

## 2020-05-07 MED ORDER — TRULICITY 4.5 MG/0.5ML ~~LOC~~ SOAJ: 4.5000 mg | SUBCUTANEOUS | 1 refills | Status: DC

## 2020-05-07 NOTE — Telephone Encounter (Signed)
-----   Message from Lajean Manes, [facility] sent at 05/07/2020 11:39 AM EST ----- Could you please print  a new prescription for the increased Trulicity 4.5mg  dose to fax to [facility] patient assistance? Thank You.

## 2020-05-22 NOTE — Chronic Care Management (AMB) (Signed)
  Care Management   Note  05/22/2020 Name: ELLIETTE SEABOLT MRN: [id number] DOB: 1945/05/22  MCKENNAH KRETCHMER is a 75 y.o. year old female who is a primary care patient of Dorcas Carrow, DO and is actively engaged with the care management team. I reached out to Caren Hazy by phone today to assist with re-scheduling a follow up visit with the RN Case Manager  Follow up plan: Unsuccessful telephone outreach attempt made. A HIPAA compliant phone message was left for the patient providing contact information and requesting a return call.  The care management team will reach out to the patient again over the next 7 days.  If patient returns call to provider office, please advise to call Embedded Care Management Care Guide Penne Lash  at [phone]  Penne Lash, RMA Care Guide, Embedded Care Coordination [facility]  [place], [place] [address] Direct Dial: [phone] [email or web address] Website: [email or web address]

## 2020-05-24 DIAGNOSIS — H8110 Benign paroxysmal vertigo, unspecified ear: Secondary | ICD-10-CM | POA: Diagnosis not present

## 2020-05-24 DIAGNOSIS — R739 Hyperglycemia, unspecified: Secondary | ICD-10-CM | POA: Diagnosis not present

## 2020-05-24 DIAGNOSIS — R42 Dizziness and giddiness: Secondary | ICD-10-CM | POA: Diagnosis not present

## 2020-05-24 DIAGNOSIS — R296 Repeated falls: Secondary | ICD-10-CM | POA: Diagnosis not present

## 2020-05-28 ENCOUNTER — Telehealth: Payer: Self-pay | Admitting: Pharmacist

## 2020-05-28 ENCOUNTER — Encounter: Payer: Self-pay | Admitting: Pharmacist

## 2020-05-28 NOTE — Telephone Encounter (Signed)
Received call from patient stating she has not received Trulicity from patient assistance program. Per CPA conversation with [employer] representative last week, shipping times are currently 4-6 weeks and they do not expedite. Patient given 1 month manufacturer voucher for Trulicity and instructed to take to her pharmacy.

## 2020-05-31 ENCOUNTER — Ambulatory Visit: Payer: Medicare Other | Admitting: Family Medicine

## 2020-06-03 ENCOUNTER — Other Ambulatory Visit: Payer: Self-pay | Admitting: Family Medicine

## 2020-06-03 NOTE — Telephone Encounter (Signed)
Requested Prescriptions  Pending Prescriptions Disp Refills  . rosuvastatin (CRESTOR) 10 MG tablet [Pharmacy Med Name: Rosuvastatin Calcium 10 MG Oral Tablet] 90 tablet 0    Sig: Take 1 tablet by mouth once daily     Cardiovascular:  Antilipid - Statins Failed - 06/03/2020  5:29 PM      Failed - LDL in normal range and within 360 days    LDL Chol Calc (NIH)  Date Value Ref Range Status  10/18/2019 65 0 - 99 mg/dL Final         Passed - Total Cholesterol in normal range and within 360 days    Cholesterol, Total  Date Value Ref Range Status  10/18/2019 137 100 - 199 mg/dL Final   Cholesterol Piccolo, Waived  Date Value Ref Range Status  10/25/2015 228 (H) <200 mg/dL Final    Comment:                            Desirable                <200                         Borderline High      200- 239                         High                     >239          Passed - HDL in normal range and within 360 days    HDL  Date Value Ref Range Status  10/18/2019 47 >39 mg/dL Final         Passed - Triglycerides in normal range and within 360 days    Triglycerides  Date Value Ref Range Status  10/18/2019 147 0 - 149 mg/dL Final   Triglycerides Piccolo,Waived  Date Value Ref Range Status  10/25/2015 162 (H) <150 mg/dL Final    Comment:                            Normal                   <150                         Borderline High     150 - 199                         High                200 - 499                         Very High                >499          Passed - Patient is not pregnant      Passed - Valid encounter within last 12 months    Recent Outpatient Visits          1 month ago Dizziness   [facility] [place], [place], DO   2 months ago Dizziness   [facility] [place], [place], DO  2 months ago Dizziness   [facility] Caro [place], DO   2 months ago Type 2 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 disease,  without long-term current use of insulin [facility])   [facility] Caro Laroche, DO   4 months ago Type 2 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 disease, without long-term current use of insulin (HCC)   [facility] [place], Megan P, DO      Future Appointments            In 5 months [facility], [facility]

## 2020-06-07 ENCOUNTER — Telehealth: Payer: Self-pay | Admitting: Pharmacist

## 2020-06-07 NOTE — Progress Notes (Signed)
    Chronic Care Management Pharmacy Assistant   Name: Alison Hernandez  MRN: [id number] DOB: 01/02/1946  Reason for Encounter: Patient Assistance Applications   Medications: Outpatient Encounter Medications as of 06/07/2020  Medication Sig  . Blood Glucose Monitoring Suppl (ONE TOUCH ULTRA 2) w/Device KIT Test 3x a day  . buPROPion (WELLBUTRIN XL) 300 MG 24 hr tablet Take 1 tablet (300 mg total) by mouth daily.  . Dulaglutide (TRULICITY) 4.5 ZS/0.1UX SOPN Inject 4.5 mg as directed once a week.  Marland Kitchen glucose blood test strip Use as instructed  . lisinopril-hydrochlorothiazide (ZESTORETIC) 20-25 MG tablet Take 1 tablet by mouth daily.  . meclizine (ANTIVERT) 25 MG tablet Take 1 tablet (25 mg total) by mouth 3 (three) times daily as needed for dizziness.  . metFORMIN (GLUCOPHAGE) 1000 MG tablet Take 1 tablet (1,000 mg total) by mouth 2 (two) times daily with a meal.  . Multiple Vitamin (MULTIVITAMIN) tablet Take 1 tablet by mouth daily.  . NYSTATIN powder APPLY  POWDER TOPICALLY 4 TIMES DAILY  . omeprazole (PRILOSEC) 20 MG capsule Take 2 capsules (40 mg total) by mouth daily.  . ondansetron (ZOFRAN-ODT) 8 MG disintegrating tablet DISSOLVE 1 TABLET IN MOUTH EVERY 8 HOURS AS NEEDED FOR NAUSEA OR VOMITING  . ONE TOUCH ULTRA TEST test strip Test 3x a day  . OneTouch Delica Lancets 32T MISC Test 3x a day  . rosuvastatin (CRESTOR) 10 MG tablet Take 1 tablet by mouth once daily   No facility-administered encounter medications on file as of 06/07/2020.     Called [facility] at [phone] regarding status on patient assistant application for trulicity 4.$RemoveBeforeD'5mg'[id number]$  .[facility] confirmed that patient was approved from May 15, 2020 until March 23, 2021. They informed me that medication will be delivered to patients home address .    Georgiana Shore ,[place] Pharmacist Assistant [phone]

## 2020-06-12 NOTE — Chronic Care Management (AMB) (Signed)
  Care Management   Note  06/12/2020 Name: Alison Hernandez MRN: [id number] DOB: 09-Apr-1945  Alison Hernandez is a 75 y.o. year old female who is a primary care patient of Dorcas Carrow, DO and is actively engaged with the care management team. I reached out to Caren Hazy by phone today to assist with re-scheduling a follow up visit with the RN Case Manager  Follow up plan: Telephone appointment with care management team member scheduled for:07/24/2020  Penne Lash, RMA Care Guide, Embedded Care Coordination [facility]  [place], [place] [address] Direct Dial: [phone] [email or web address]

## 2020-07-02 ENCOUNTER — Telehealth: Payer: Self-pay

## 2020-07-02 NOTE — Progress Notes (Addendum)
    Chronic Care Management Pharmacy Assistant   Name: Alison Hernandez  MRN: [id number] DOB: December 26, 1945  Reason for Encounter: Patient Assistance Documentation   Medications: Outpatient Encounter Medications as of 07/02/2020  Medication Sig   Blood Glucose Monitoring Suppl (ONE TOUCH ULTRA 2) w/Device KIT Test 3x a day   buPROPion (WELLBUTRIN XL) 300 MG 24 hr tablet Take 1 tablet (300 mg total) by mouth daily.   Dulaglutide (TRULICITY) 4.5 VJ/2.8AS SOPN Inject 4.5 mg as directed once a week.   glucose blood test strip Use as instructed   lisinopril-hydrochlorothiazide (ZESTORETIC) 20-25 MG tablet Take 1 tablet by mouth daily.   meclizine (ANTIVERT) 25 MG tablet Take 1 tablet (25 mg total) by mouth 3 (three) times daily as needed for dizziness.   metFORMIN (GLUCOPHAGE) 1000 MG tablet Take 1 tablet (1,000 mg total) by mouth 2 (two) times daily with a meal.   Multiple Vitamin (MULTIVITAMIN) tablet Take 1 tablet by mouth daily.   NYSTATIN powder APPLY  POWDER TOPICALLY 4 TIMES DAILY   omeprazole (PRILOSEC) 20 MG capsule Take 2 capsules (40 mg total) by mouth daily.   ondansetron (ZOFRAN-ODT) 8 MG disintegrating tablet DISSOLVE 1 TABLET IN MOUTH EVERY 8 HOURS AS NEEDED FOR NAUSEA OR VOMITING   ONE TOUCH ULTRA TEST test strip Test 3x a day   OneTouch Delica Lancets 60R MISC Test 3x a day   rosuvastatin (CRESTOR) 10 MG tablet Take 1 tablet by mouth once daily   No facility-administered encounter medications on file as of 07/02/2020.   Patient called to inform us that she has not yet received her Trulicity through P[facility] and has only two pens left.  I called [facility] and was informed that they did not have the correct dose and would need a new Rx faxed over. I reached out to Birdena Crandall, CPP to notify her.    Wilford Sports CPA, CMA  I have reviewed the care management and care coordination activities outlined in this encounter and I am certifying that I agree with the content of this  note.  Junita Push. Kenton Kingfisher PharmD, [facility] [phone]

## 2020-07-02 NOTE — Progress Notes (Signed)
error 

## 2020-07-03 ENCOUNTER — Other Ambulatory Visit: Payer: Self-pay

## 2020-07-03 ENCOUNTER — Telehealth: Payer: Self-pay | Admitting: Pharmacist

## 2020-07-03 ENCOUNTER — Telehealth: Payer: Self-pay

## 2020-07-03 MED ORDER — TRULICITY 4.5 MG/0.5ML ~~LOC~~ SOAJ: 4.5000 mg | SUBCUTANEOUS | 3 refills | Status: AC

## 2020-07-03 NOTE — Addendum Note (Signed)
Addended by: Dorcas Carrow on: 07/03/2020 10:13 AM   Modules accepted: Orders

## 2020-07-03 NOTE — Telephone Encounter (Signed)
Prescription faxed

## 2020-07-03 NOTE — Telephone Encounter (Signed)
[employer] is requesting a new RX for Trulicity 4.5 mg weekly. Can you please print and fax directly to their processing pharmacy [facility] at [phone]? Thank You.

## 2020-07-03 NOTE — Chronic Care Management (AMB) (Signed)
Chronic Care Management Pharmacy Assistant   Name: Alison Hernandez  MRN: [id number] DOB: Sep 27, 1945  Reason for Encounter: Diabetes Mellitus Disease State Call  Recent office visits:  No visits noted   Recent consult visits:  05/24/2020- Alison Hernandez (Neurology)- Dizziness, started asprin 53m daily, referral to endocrinology  Hospital visits:  None in previous 6 months  Medications: Outpatient Encounter Medications as of 07/03/2020  Medication Sig  . Blood Glucose Monitoring Suppl (ONE TOUCH ULTRA 2) w/Device KIT Test 3x a day  . buPROPion (WELLBUTRIN XL) 300 MG 24 hr tablet Take 1 tablet (300 mg total) by mouth daily.  . Dulaglutide (TRULICITY) 4.5 MTT/0.1XBSOPN Inject 4.5 mg as directed once a week.  .Marland Kitchenglucose blood test strip Use as instructed  . lisinopril-hydrochlorothiazide (ZESTORETIC) 20-25 MG tablet Take 1 tablet by mouth daily.  . meclizine (ANTIVERT) 25 MG tablet Take 1 tablet (25 mg total) by mouth 3 (three) times daily as needed for dizziness.  . metFORMIN (GLUCOPHAGE) 1000 MG tablet Take 1 tablet (1,000 mg total) by mouth 2 (two) times daily with a meal.  . Multiple Vitamin (MULTIVITAMIN) tablet Take 1 tablet by mouth daily.  . NYSTATIN powder APPLY  POWDER TOPICALLY 4 TIMES DAILY  . omeprazole (PRILOSEC) 20 MG capsule Take 2 capsules (40 mg total) by mouth daily.  . ondansetron (ZOFRAN-ODT) 8 MG disintegrating tablet DISSOLVE 1 TABLET IN MOUTH EVERY 8 HOURS AS NEEDED FOR NAUSEA OR VOMITING  . ONE TOUCH ULTRA TEST test strip Test 3x a day  . OneTouch Delica Lancets 393JMISC Test 3x a day  . rosuvastatin (CRESTOR) 10 MG tablet Take 1 tablet by mouth once daily   No facility-administered encounter medications on file as of 07/03/2020.   Recent Relevant Labs: Lab Results  Component Value Date/Time   HGBA1C 8.4 (H) 04/04/2020 08:36 AM   HGBA1C 7.4 (H) 01/17/2020 11:07 AM   HGBA1C 7.5 11/28/2015 12:00 AM   MICROALBUR 30 (H) 05/19/2019 11:07 AM   MICROALBUR 80  (H) 11/18/2018 01:15 PM    Kidney Function Lab Results  Component Value Date/Time   CREATININE 1.27 (H) 04/04/2020 08:37 AM   CREATININE 1.27 (H) 03/21/2020 11:17 AM   GFRNONAA 42 (L) 04/04/2020 08:37 AM   GFRAA 48 (L) 04/04/2020 08:37 AM    Current antihyperglycemic regimen:  Metformin 10078mone tab  twice daily Trulicity 4.5 MGQZ/0.0PQOPN-Inject 4.5 mg every Sunday  What recent interventions/DTPs have been made to improve glycemic control:  No recent interventions  Have there been any recent hospitalizations or ED visits since last visit with CPP? No    Patient denies hypoglycemic symptoms, including Pale, Sweaty, Shaky, Hungry, Nervous/irritable and Vision changes   Patient reports hyperglycemic symptoms, including getting hot  How often are you checking your blood sugar? Patient stated she checks her BS before meals, before breakfast and at bedtime   What are your blood sugars ranging?  o Fasting: 211, 194 o Before meals: n/a o After meals:285 o Bedtime: 230,255  During the week, how often does your blood glucose drop below 70? Never  [name] she sometimes does not eat before bedtime, but she may occasionally have sugar free jello and apple sauce. Her goal is to lose weight and get her sugars to be more controlled.   Are you checking your feet daily/regularly?  Patient stated she regularly checks her feet and they look good   Adherence Review: Is the patient currently on a STATIN medication? Yes  Rosuvastatin  30m- 90DS last filled 06/04/20  Is the patient currently on ACE/ARB medication? Yes  Lisinopril- Hydrochlorothiazide 20-25 mg 90DS last filled 01/20/20  Does the patient have >5 day gap between last estimated fill dates? Yes  Star Rating Drugs: Trulicity 4.5 MGK/9.8QUSOPN- 28DS last filled 06/20/20 Metformin 10086m 90DS last filled 03/11/20 Patient reported taking metformin once daily instead of bid so she has 1 bottle remaining. Lisinopril-  Hydrochlorothiazide 20-25 mg 90DS last filled 01/20/20- 1 bottle remaining Rosuvastatin 1082m90DS last filled 06/04/20  AshWilford SportsA, CMA

## 2020-07-03 NOTE — Telephone Encounter (Signed)
Rx printed

## 2020-07-19 ENCOUNTER — Telehealth: Payer: Self-pay

## 2020-07-19 NOTE — Chronic Care Management (AMB) (Signed)
    Chronic Care Management Pharmacy Assistant   Name: Alison Hernandez  MRN: [id number] DOB: 1945-11-11  Reason for Encounter: Patient Assistance Application     Medications: Outpatient Encounter Medications as of 07/19/2020  Medication Sig  . Blood Glucose Monitoring Suppl (ONE TOUCH ULTRA 2) w/Device KIT Test 3x a day  . buPROPion (WELLBUTRIN XL) 300 MG 24 hr tablet Take 1 tablet (300 mg total) by mouth daily.  . Dulaglutide (TRULICITY) 4.5 VO/1.6WV SOPN Inject 4.5 mg as directed once a week.  Marland Kitchen glucose blood test strip Use as instructed  . lisinopril-hydrochlorothiazide (ZESTORETIC) 20-25 MG tablet Take 1 tablet by mouth daily.  . meclizine (ANTIVERT) 25 MG tablet Take 1 tablet (25 mg total) by mouth 3 (three) times daily as needed for dizziness.  . metFORMIN (GLUCOPHAGE) 1000 MG tablet Take 1 tablet (1,000 mg total) by mouth 2 (two) times daily with a meal.  . Multiple Vitamin (MULTIVITAMIN) tablet Take 1 tablet by mouth daily.  . NYSTATIN powder APPLY  POWDER TOPICALLY 4 TIMES DAILY  . omeprazole (PRILOSEC) 20 MG capsule Take 2 capsules (40 mg total) by mouth daily.  . ondansetron (ZOFRAN-ODT) 8 MG disintegrating tablet DISSOLVE 1 TABLET IN MOUTH EVERY 8 HOURS AS NEEDED FOR NAUSEA OR VOMITING  . ONE TOUCH ULTRA TEST test strip Test 3x a day  . OneTouch Delica Lancets 37T MISC Test 3x a day  . rosuvastatin (CRESTOR) 10 MG tablet Take 1 tablet by mouth once daily   No facility-administered encounter medications on file as of 07/19/2020.    [place] patient assistance regarding Trulicity status. Trulicity was delivered to patients address on 07/13/20. Patient has 3 refills left each for 90 day supply.  [name] Clinical Pharmacist Assistant [phone]

## 2020-07-24 ENCOUNTER — Telehealth: Payer: Medicare Other

## 2020-07-24 ENCOUNTER — Telehealth: Payer: Self-pay | Admitting: General Practice

## 2020-07-24 NOTE — Telephone Encounter (Signed)
  Chronic Care Management   Outreach Note  07/24/2020 Name: Alison Hernandez MRN: [id number] DOB: 14-Mar-1946  Referred by: Dorcas Carrow, DO Reason for referral : Appointment (RNCM: Follow up for Chronic Disease Management and Care Coordination Needs )   An unsuccessful telephone outreach was attempted today. The patient was referred to the case management team for assistance with care management and care coordination.   Follow Up Plan: A HIPAA compliant phone message was left for the patient providing contact information and requesting a return call.   Alto Denver RN, MSN, CCM Community Care Coordinator [place]  [facility] Mobile: [phone]

## 2020-07-27 ENCOUNTER — Telehealth: Payer: Self-pay

## 2020-07-27 NOTE — Chronic Care Management (AMB) (Signed)
  Care Management   Note  07/27/2020 Name: MONTOYA WATKIN MRN: [id number] DOB: Aug 15, 1945  TOSHUA HONSINGER is a 75 y.o. year old female who is a primary care patient of Dorcas Carrow, DO and is actively engaged with the care management team. I reached out to Caren Hazy by phone today to assist with re-scheduling a follow up visit with the RN Case Manager  Follow up plan: Unsuccessful telephone outreach attempt made. A HIPAA compliant phone message was left for the patient providing contact information and requesting a return call.  The care management team will reach out to the patient again over the next 7 days.  If patient returns call to provider office, please advise to call Embedded Care Management Care Guide Penne Lash  at [phone]  Penne Lash, RMA Care Guide, Embedded Care Coordination [facility]  [place], [place] [address] Direct Dial: [phone] [email or web address] Website: [email or web address]

## 2020-07-30 ENCOUNTER — Other Ambulatory Visit: Payer: Self-pay | Admitting: Family Medicine

## 2020-07-30 NOTE — Telephone Encounter (Signed)
Lvm to make this apt. 

## 2020-07-30 NOTE — Telephone Encounter (Signed)
Requested medication (s) are due for refill today:   Yes  Requested medication (s) are on the active medication list:   Yes  Future visit scheduled:   No    Last ordered: 10/18/2019 #180, 1 refill  Returned because protocol failed.   Lab work due.   Requested Prescriptions  Pending Prescriptions Disp Refills   metFORMIN (GLUCOPHAGE) 1000 MG tablet [Pharmacy Med Name: metFORMIN HCl 1000 MG Oral Tablet] 180 tablet 0    Sig: TAKE 1 TABLET BY MOUTH TWICE DAILY WITH A MEAL      Endocrinology:  Diabetes - Biguanides Failed - 07/30/2020  5:30 AM      Failed - Cr in normal range and within 360 days    Creatinine, Ser  Date Value Ref Range Status  04/04/2020 1.27 (H) 0.57 - 1.00 mg/dL Final          Failed - HBA1C is between 0 and 7.9 and within 180 days    Hemoglobin A1C  Date Value Ref Range Status  11/28/2015 7.5  Final   HB A1C (BAYER DCA - WAIVED)  Date Value Ref Range Status  04/04/2020 8.4 (H) <7.0 % Final    Comment:                                          Diabetic Adult            <7.0                                       Healthy Adult        4.3 - 5.7                                                           (DCCT/NGSP) American Diabetes Association's Summary of Glycemic Recommendations for Adults with Diabetes: Hemoglobin A1c <7.0%. More stringent glycemic goals (A1c <6.0%) may further reduce complications at the cost of increased risk of hypoglycemia.           Failed - eGFR in normal range and within 360 days    GFR calc Af Amer  Date Value Ref Range Status  04/04/2020 48 (L) >59 mL/min/1.73 Final    Comment:    **In accordance with recommendations from the NKF-ASN Task force,**   [facility] is in the process of updating its eGFR calculation to the   2021 CKD-EPI creatinine equation that estimates kidney function   without a race variable.    GFR calc non Af Amer  Date Value Ref Range Status  04/04/2020 42 (L) >59 mL/min/1.73 Final          Passed - Valid  encounter within last 6 months    Recent Outpatient Visits           3 months ago Dizziness   Girard, [place], DO   3 months ago Dizziness   [place], [place], DO   4 months ago Dizziness   [place], DO   4 months ago Type 2 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 disease,  without long-term current use of insulin [facility])   [facility] Myles Gip, DO   6 months ago Type 2 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 disease, without long-term current use of insulin ([place])   [facility] [place], Megan P, DO       Future Appointments             In 3 months [place], [facility]

## 2020-07-31 NOTE — Telephone Encounter (Signed)
Pt is scheduled for 08/02/2020 has no more medication left.

## 2020-08-02 ENCOUNTER — Encounter: Payer: Self-pay | Admitting: Family Medicine

## 2020-08-02 ENCOUNTER — Ambulatory Visit (INDEPENDENT_AMBULATORY_CARE_PROVIDER_SITE_OTHER): Payer: Medicare Other | Admitting: Family Medicine

## 2020-08-02 ENCOUNTER — Other Ambulatory Visit: Payer: Self-pay

## 2020-08-02 VITALS — BP 130/78 | HR 97 | Temp 98.0°F | Wt 162.0 lb

## 2020-08-02 DIAGNOSIS — D692 Other nonthrombocytopenic purpura: Secondary | ICD-10-CM

## 2020-08-02 DIAGNOSIS — I129 Hypertensive chronic kidney disease with stage 1 through stage 4 chronic kidney disease, or unspecified chronic kidney disease: Secondary | ICD-10-CM

## 2020-08-02 DIAGNOSIS — E782 Mixed hyperlipidemia: Secondary | ICD-10-CM

## 2020-08-02 DIAGNOSIS — E1122 Type 2 diabetes mellitus with diabetic chronic kidney disease: Secondary | ICD-10-CM

## 2020-08-02 DIAGNOSIS — N182 Chronic kidney disease, stage 2 (mild): Secondary | ICD-10-CM | POA: Diagnosis not present

## 2020-08-02 DIAGNOSIS — F3341 Major depressive disorder, recurrent, in partial remission: Secondary | ICD-10-CM

## 2020-08-02 LAB — BAYER DCA HB A1C WAIVED: HB A1C (BAYER DCA - WAIVED): 8.7 % — ABNORMAL HIGH (ref <7.0)

## 2020-08-02 MED ORDER — OMEPRAZOLE 20 MG PO CPDR: 40.0000 mg | DELAYED_RELEASE_CAPSULE | Freq: Every day | ORAL | 1 refills | Status: DC

## 2020-08-02 MED ORDER — METFORMIN HCL 1000 MG PO TABS: 1000.0000 mg | ORAL_TABLET | Freq: Two times a day (BID) | ORAL | 1 refills | Status: DC

## 2020-08-02 MED ORDER — BUPROPION HCL ER (XL) 300 MG PO TB24: 300.0000 mg | ORAL_TABLET | Freq: Every day | ORAL | 1 refills | Status: DC

## 2020-08-02 MED ORDER — LISINOPRIL-HYDROCHLOROTHIAZIDE 20-25 MG PO TABS: 1.0000 | ORAL_TABLET | Freq: Every day | ORAL | 1 refills | Status: DC

## 2020-08-02 MED ORDER — ROSUVASTATIN CALCIUM 10 MG PO TABS: 10.0000 mg | ORAL_TABLET | Freq: Every day | ORAL | 1 refills | Status: DC

## 2020-08-02 NOTE — Assessment & Plan Note (Signed)
Under good control on current regimen. Continue current regimen. Continue to monitor. Call with any concerns. Refills given. Labs drawn today.   

## 2020-08-02 NOTE — Progress Notes (Signed)
BP 130/78   Pulse 97   Temp 98 F (36.7 C)   Wt 162 lb (73.5 kg)   SpO2 99%   BMI 26.15 kg/m    Subjective:    Patient ID: Alison Hernandez, female    DOB: 1945/04/13, 75 y.o.   MRN: [id number]  HPI: [name] is a 75 y.o. female  Chief Complaint  Patient presents with  . Diabetes  . Hyperlipidemia  . Hypertension   DIABETES- has been cheating on her diet. Has only been on her trulicity for about a month Hypoglycemic episodes:no Polydipsia/polyuria: no Visual disturbance: no Chest pain: no Paresthesias: no Glucose Monitoring: yes  Accucheck frequency: Not Checking  Fasting glucose:  Post prandial:  Evening:  Before meals: Taking Insulin?: no Blood Pressure Monitoring: not checking Retinal Examination: Up to Date Foot Exam: Up to Date Diabetic Education: Completed Pneumovax: Up to Date Influenza: Up to Date Aspirin: yes  HYPERTENSION / HYPERLIPIDEMIA Satisfied with current treatment? yes Duration of hypertension: chronic BP monitoring frequency: not checking BP medication side effects: no Past BP meds: lisinopril-hctz Duration of hyperlipidemia: chronic Cholesterol medication side effects: no Cholesterol supplements: none Past cholesterol medications: crestor Medication compliance: excellent compliance Aspirin: yes Recent stressors: no Recurrent headaches: no Visual changes: no Palpitations: no Dyspnea: no Chest pain: no Lower extremity edema: no Dizzy/lightheaded: no  DEPRESSION Mood status: controlled Satisfied with current treatment?: yes Symptom severity: mild  Duration of current treatment : chronic Side effects: no Medication compliance: excellent compliance Psychotherapy/counseling: no   Previous psychiatric medications: wellbutrin Depressed mood: no Anxious mood: no Anhedonia: no Significant weight loss or gain: no Insomnia: no  Fatigue: no Feelings of worthlessness or guilt: no Impaired concentration/indecisiveness:  no Suicidal ideations: no Hopelessness: no Crying spells: no Depression screen [facility] 2/9 08/02/2020 04/19/2020 01/17/2020 11/21/2019 10/18/2019  Decreased Interest 0 0 0 0 0  Down, Depressed, Hopeless 0 0 0 0 0  PHQ - 2 Score 0 0 0 0 0  Altered sleeping 0 0 0 - 0  Tired, decreased energy 0 0 0 - 0  Change in appetite 0 0 0 - 0  Feeling bad or failure about yourself  0 0 0 - 0  Trouble concentrating 0 0 0 - 0  Moving slowly or fidgety/restless 0 0 0 - 0  Suicidal thoughts 0 0 0 - 0  PHQ-9 Score 0 0 0 - 0  Difficult doing work/chores - - Not difficult at all - Not difficult at all  Some recent data might be hidden     Relevant past medical, surgical, family and social history reviewed and updated as indicated. Interim medical history since our last visit reviewed. Allergies and medications reviewed and updated.  Review of Systems  Constitutional: Negative.   Respiratory: Negative.   Cardiovascular: Negative.   Gastrointestinal: Negative.   Musculoskeletal: Negative.   Psychiatric/Behavioral: Negative.     Per HPI unless specifically indicated above     Objective:    BP 130/78   Pulse 97   Temp 98 F (36.7 C)   Wt 162 lb (73.5 kg)   SpO2 99%   BMI 26.15 kg/m   Wt Readings from Last 3 Encounters:  08/02/20 162 lb (73.5 kg)  04/19/20 155 lb (70.3 kg)  04/03/20 154 lb (69.9 kg)    Physical Exam Vitals and nursing note reviewed.  Constitutional:      General: She is not in acute distress.    Appearance: Normal appearance. She is not ill-appearing,  toxic-appearing or diaphoretic.  HENT:     Head: Normocephalic and atraumatic.     Right Ear: External ear normal.     Left Ear: External ear normal.     Nose: Nose normal.     Mouth/Throat:     Mouth: Mucous membranes are moist.     Pharynx: Oropharynx is clear.  Eyes:     General: No scleral icterus.       Right eye: No discharge.        Left eye: No discharge.     Extraocular Movements: Extraocular movements intact.      Conjunctiva/sclera: Conjunctivae normal.     Pupils: Pupils are equal, round, and reactive to light.  Cardiovascular:     Rate and Rhythm: Normal rate and regular rhythm.     Pulses: Normal pulses.     Heart sounds: Normal heart sounds. No murmur heard. No friction rub. No gallop.   Pulmonary:     Effort: Pulmonary effort is normal. No respiratory distress.     Breath sounds: Normal breath sounds. No stridor. No wheezing, rhonchi or rales.  Chest:     Chest wall: No tenderness.  Musculoskeletal:        General: Normal range of motion.     Cervical back: Normal range of motion and neck supple.  Skin:    General: Skin is warm and dry.     Capillary Refill: Capillary refill takes less than 2 seconds.     Coloration: Skin is not jaundiced or pale.     Findings: No bruising, erythema, lesion or rash.  Neurological:     General: No focal deficit present.     Mental Status: She is alert and oriented to person, place, and time. Mental status is at baseline.  Psychiatric:        Mood and Affect: Mood normal.        Behavior: Behavior normal.        Thought Content: Thought content normal.        Judgment: Judgment normal.     Results for orders placed or performed in visit on 08/02/20  Bayer DCA Hb A1c Waived  Result Value Ref Range   HB A1C (BAYER DCA - WAIVED) 8.7 (H) <7.0 %      Assessment & Plan:   Problem List Items Addressed This Visit      Cardiovascular and Mediastinum   Senile purpura (HCC)    Reassured patient. Continue to monitor.      Relevant Medications   aspirin EC 81 MG tablet   rosuvastatin (CRESTOR) 10 MG tablet   lisinopril-hydrochlorothiazide (ZESTORETIC) 20-25 MG tablet     Endocrine   Diabetes (HCC) - Primary    Going in the wrong direction with A1c of 8.7- will really work on diet and exercise and recheck 3 months. Call with any concerns. Refills given.       Relevant Medications   aspirin EC 81 MG tablet   rosuvastatin (CRESTOR) 10 MG  tablet   lisinopril-hydrochlorothiazide (ZESTORETIC) 20-25 MG tablet   metFORMIN (GLUCOPHAGE) 1000 MG tablet   Other Relevant Orders   Bayer DCA Hb A1c Waived (Completed)   Comprehensive metabolic panel     Genitourinary   Benign hypertensive renal disease    Under good control on current regimen. Continue current regimen. Continue to monitor. Call with any concerns. Refills given. Labs drawn today.       Relevant Orders   Comprehensive metabolic panel  Other   Hyperlipidemia    Under good control on current regimen. Continue current regimen. Continue to monitor. Call with any concerns. Refills given. Labs drawn today.       Relevant Medications   aspirin EC 81 MG tablet   rosuvastatin (CRESTOR) 10 MG tablet   lisinopril-hydrochlorothiazide (ZESTORETIC) 20-25 MG tablet   Other Relevant Orders   Comprehensive metabolic panel   Lipid Panel w/o Chol/HDL Ratio   Depression    Under good control on current regimen. Continue current regimen. Continue to monitor. Call with any concerns. Refills given. Labs drawn today.       Relevant Medications   buPROPion (WELLBUTRIN XL) 300 MG 24 hr tablet       Follow up plan: Return in about 3 months (around 11/02/2020).

## 2020-08-02 NOTE — Assessment & Plan Note (Signed)
Going in the wrong direction with A1c of 8.7- will really work on diet and exercise and recheck 3 months. Call with any concerns. Refills given.

## 2020-08-02 NOTE — Assessment & Plan Note (Signed)
Reassured patient. Continue to monitor.  

## 2020-08-03 LAB — COMPREHENSIVE METABOLIC PANEL
ALT: 8 IU/L (ref 0–32)
AST: 11 IU/L (ref 0–40)
Albumin/Globulin Ratio: 1.9 (ref 1.2–2.2)
Albumin: 4.3 g/dL (ref 3.7–4.7)
Alkaline Phosphatase: 78 IU/L (ref 44–121)
BUN/Creatinine Ratio: 24 (ref 12–28)
BUN: 27 mg/dL (ref 8–27)
Bilirubin Total: 0.3 mg/dL (ref 0.0–1.2)
CO2: 21 mmol/L (ref 20–29)
Calcium: 8.8 mg/dL (ref 8.7–10.3)
Chloride: 99 mmol/L (ref 96–106)
Creatinine, Ser: 1.11 mg/dL — ABNORMAL HIGH (ref 0.57–1.00)
Globulin, Total: 2.3 g/dL (ref 1.5–4.5)
Glucose: 149 mg/dL — ABNORMAL HIGH (ref 65–99)
Potassium: 4.1 mmol/L (ref 3.5–5.2)
Sodium: 138 mmol/L (ref 134–144)
Total Protein: 6.6 g/dL (ref 6.0–8.5)
eGFR: 52 mL/min/{1.73_m2} — ABNORMAL LOW (ref >59)

## 2020-08-03 LAB — LIPID PANEL W/O CHOL/HDL RATIO
Cholesterol, Total: 142 mg/dL (ref 100–199)
HDL: 51 mg/dL (ref >39)
LDL Chol Calc (NIH): 67 mg/dL (ref 0–99)
Triglycerides: 140 mg/dL (ref 0–149)
VLDL Cholesterol Cal: 24 mg/dL (ref 5–40)

## 2020-08-08 ENCOUNTER — Telehealth: Payer: Self-pay | Admitting: Pharmacist

## 2020-08-08 ENCOUNTER — Telehealth: Payer: Self-pay

## 2020-08-08 NOTE — Progress Notes (Deleted)
Chronic Care Management Pharmacy Note  08/08/2020 Name:  Alison Hernandez MRN:  [id number] DOB:  06/20/1945  Subjective: [name] is an 75 y.o. year old female who is a primary patient of Valerie Roys, DO.  The CCM team was consulted for assistance with disease management and care coordination needs.    Engaged with patient by telephone for follow up visit in response to provider referral for pharmacy case management and/or care coordination services.   Consent to Services:  The patient was given information about Chronic Care Management services, agreed to services, and gave verbal consent prior to initiation of services.  Please see initial visit note for detailed documentation.   Patient Care Team: Valerie Roys, DO as PCP - General (Family Medicine) Tania Ade, MD as Consulting Physician (Orthopedic Surgery) Garrel Ridgel, [place] as Consulting Physician (Podiatry) Vanita Ingles, RN as Registered Nurse (General Practice)  Recent office visits: 5/12/22Wynetta Emery (PCP)- blood work  Recent consult visits: 05/24/20- Potter(neuro)- started aspirin, referred to enodcrinology  Hospital visits: {Hospital DC Yes/No:25215}  Objective:  Lab Results  Component Value Date   CREATININE 1.11 (H) 08/02/2020   BUN 27 08/02/2020   GFRNONAA 42 (L) 04/04/2020   GFRAA 48 (L) 04/04/2020   NA 138 08/02/2020   K 4.1 08/02/2020   CALCIUM 8.8 08/02/2020   CO2 21 08/02/2020   GLUCOSE 149 (H) 08/02/2020    Lab Results  Component Value Date/Time   HGBA1C 8.7 (H) 08/02/2020 03:49 PM   HGBA1C 8.4 (H) 04/04/2020 08:36 AM   HGBA1C 7.5 11/28/2015 12:00 AM   MICROALBUR 30 (H) 05/19/2019 11:07 AM   MICROALBUR 80 (H) 11/18/2018 01:15 PM    Last diabetic Eye exam: No results found for: HMDIABEYEEXA  Last diabetic Foot exam: No results found for: HMDIABFOOTEX   Lab Results  Component Value Date   CHOL 142 08/02/2020   HDL 51 08/02/2020   LDLCALC 67 08/02/2020   TRIG 140  08/02/2020    Hepatic Function Latest Ref Rng & Units 08/02/2020 04/04/2020 10/18/2019  Total Protein 6.0 - 8.5 g/dL 6.6 7.3 6.8  Albumin 3.7 - 4.7 g/dL 4.3 4.5 4.4  AST 0 - 40 IU/L 11 7 12   ALT 0 - 32 IU/L 8 14 8   Alk Phosphatase 44 - 121 IU/L 78 105 79  Total Bilirubin 0.0 - 1.2 mg/dL 0.3 0.4 0.4    Lab Results  Component Value Date/Time   TSH 3.130 03/21/2020 11:17 AM   TSH 2.760 05/19/2019 11:34 AM    CBC Latest Ref Rng & Units 04/04/2020 03/21/2020 05/19/2019  WBC 3.4 - 10.8 x10E3/uL 7.1 8.7 6.8  Hemoglobin 11.1 - 15.9 g/dL 11.4 10.7(L) 10.6(L)  Hematocrit 34.0 - 46.6 % 33.3(L) 32.4(L) 32.1(L)  Platelets 150 - 450 x10E3/uL 308 - 245    No results found for: VD25OH  Clinical ASCVD: No  The 10-year ASCVD risk score Mikey Bussing DC Jr., et al., 2013) is: 30%   Values used to calculate the score:     Age: 60 years     Sex: Female     Is Non-Hispanic African American: No     Diabetic: Yes     Tobacco smoker: No     Systolic Blood Pressure: 409 mmHg     Is BP treated: Yes     HDL Cholesterol: 51 mg/dL     Total Cholesterol: 142 mg/dL    Depression screen [facility] 2/9 08/02/2020 04/19/2020 01/17/2020  Decreased Interest 0 0 0  Down, Depressed, Hopeless 0 0 0  PHQ - 2 Score 0 0 0  Altered sleeping 0 0 0  Tired, decreased energy 0 0 0  Change in appetite 0 0 0  Feeling bad or failure about yourself  0 0 0  Trouble concentrating 0 0 0  Moving slowly or fidgety/restless 0 0 0  Suicidal thoughts 0 0 0  PHQ-9 Score 0 0 0  Difficult doing work/chores - - Not difficult at all  Some recent data might be hidden      Social History   Tobacco Use  Smoking Status Never Smoker  Smokeless Tobacco Never Used   BP Readings from Last 3 Encounters:  08/02/20 130/78  04/19/20 124/71  04/03/20 140/75   Pulse Readings from Last 3 Encounters:  08/02/20 97  04/19/20 99  03/21/20 93   Wt Readings from Last 3 Encounters:  08/02/20 162 lb (73.5 kg)  04/19/20 155 lb (70.3 kg)  04/03/20 154  lb (69.9 kg)   BMI Readings from Last 3 Encounters:  08/02/20 26.15 kg/m  04/19/20 25.02 kg/m  04/03/20 24.86 kg/m    Assessment/Interventions: Review of patient past medical history, allergies, medications, health status, including review of consultants reports, laboratory and other test data, was performed as part of comprehensive evaluation and provision of chronic care management services.   SDOH:  (Social Determinants of Health) assessments and interventions performed: No  SDOH Screenings   Alcohol Screen: Not on file  Depression (PHQ2-9): Low Risk   . PHQ-2 Score: 0  Financial Resource Strain: High Risk  . Difficulty of Paying Living Expenses: Hard  Food Insecurity: No Food Insecurity  . Worried About Charity fundraiser in the Last Year: Never true  . Ran Out of Food in the Last Year: Never true  Housing: Not on file  Physical Activity: Inactive  . Days of Exercise per Week: 0 days  . Minutes of Exercise per Session: 0 min  Social Connections: Not on file  Stress: No Stress Concern Present  . Feeling of Stress : Not at all  Tobacco Use: Low Risk   . Smoking Tobacco Use: Never Smoker  . Smokeless Tobacco Use: Never Used  Transportation Needs: No Transportation Needs  . Lack of Transportation (Medical): No  . Lack of Transportation (Non-Medical): No    CCM Care Plan  Allergies  Allergen Reactions  . Lovastatin Nausea And Vomiting  . Bydureon [Exenatide] Rash  . Losartan Rash    Medications Reviewed Today    Reviewed by Valerie Roys, DO (Physician) on 08/02/20 at 2133  Med List Status: <None>  Medication Order Taking? Sig Documenting Provider Last Dose Status Informant  aspirin EC 81 MG tablet [id number] Yes Take 81 mg by mouth daily. Swallow whole. [provider] Taking Active   Blood Glucose Monitoring Suppl (ONE TOUCH ULTRA 2) w/Device KIT [id number] Yes Test 3x a day Johnson, Megan P, DO Taking Active   buPROPion (WELLBUTRIN XL) 300 MG 24 hr  tablet [id number]  Take 1 tablet (300 mg total) by mouth daily. Johnson, Megan P, DO  Active   Dulaglutide (TRULICITY) 4.5 WL/7.9GX SOPN [id number] Yes Inject 4.5 mg as directed once a week. [place] Liter P, DO Taking Active   glucose blood test strip [id number] Yes Use as instructed Valerie Roys, DO Taking Active   lisinopril-hydrochlorothiazide (ZESTORETIC) 20-25 MG tablet [id number]  Take 1 tablet by mouth daily. Johnson, Megan P, DO  Active   meclizine (ANTIVERT) 25 MG tablet [id number]  Yes Take 1 tablet (25 mg total) by mouth 3 (three) times daily as needed for dizziness. [place] Liter P, DO Taking Active   metFORMIN (GLUCOPHAGE) 1000 MG tablet [id number]  Take 1 tablet (1,000 mg total) by mouth 2 (two) times daily with a meal. Wynetta Emery, Megan P, DO  Active   Multiple Vitamin (MULTIVITAMIN) tablet [id number] Yes Take 1 tablet by mouth daily. [provider] Taking Active   NYSTATIN powder [id number] Yes APPLY  POWDER TOPICALLY 4 TIMES DAILY Johnson, Megan P, DO Taking Active   omeprazole (PRILOSEC) 20 MG capsule [id number]  Take 2 capsules (40 mg total) by mouth daily. Johnson, Megan P, DO  Active   ondansetron (ZOFRAN-ODT) 8 MG disintegrating tablet [id number] Yes DISSOLVE 1 TABLET IN MOUTH EVERY 8 HOURS AS NEEDED FOR NAUSEA OR VOMITING Johnson, Megan P, DO Taking Active   ONE TOUCH ULTRA TEST test strip [id number] Yes Test 3x a day Valerie Roys, DO Taking Active   OneTouch Delica Lancets 54H MISC [id number] Yes Test 3x a day Wynetta Emery, Megan P, DO Taking Active   rosuvastatin (CRESTOR) 10 MG tablet [id number]  Take 1 tablet (10 mg total) by mouth daily. Valerie Roys, DO  Active           Patient Active Problem List   Diagnosis Date Noted  . Dizziness 03/21/2020  . Senile purpura ([place]) 04/07/2018  . Swelling of lower limb 04/02/2016  . Anemia 08/30/2015  . Diabetes ([place]) 03/28/2015  . 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 Hyperlipidemia 03/28/2015  . Depression  03/28/2015    Immunization History  Administered Date(s) Administered  . Fluad Quad(high Dose 65+) 11/18/2018, 01/17/2020  . Influenza, High Dose Seasonal PF 11/28/2015, 04/08/2017, 04/07/2018  . Influenza,inj,Quad PF,6+ Mos 03/28/2015  . PFIZER(Purple Top)SARS-COV-2 Vaccination 06/02/2019, 06/28/2019  . Pneumococcal Conjugate-13 08/29/2015  . Pneumococcal Polysaccharide-23 03/24/1997, 11/06/2016  . Td 05/12/2008, 11/18/2018    Conditions to be addressed/monitored:  Hypertension, Hyperlipidemia, Diabetes and Depression  There are no care plans that you recently modified to display for this patient.    Medication Assistance: Trulicityobtained through [employer] medication assistance program.  Enrollment ends 03/23/21  Patient's preferred pharmacy is:  [facility] [address] ([address]), [place] ([place]) [place] [address] Phone: [phone] Fax: [phone]  Uses pill box? {Yes or If no, why not?:20788} Pt endorses ***% compliance  We discussed: {Pharmacy options:24294} Patient decided to: {US Pharmacy Plan:23885}  Care Plan and Follow Up Patient Decision:  {FOLLOWUP:24991}  Plan: {CM FOLLOW UP ZFPO:25189}  ***  Current Barriers:  . {pharmacybarriers:24917}  Pharmacist Clinical Goal(s):  Marland Kitchen Patient will {PHARMACYGOALCHOICES:24921} through collaboration with PharmD and provider.   Interventions: . 1:1 collaboration with Valerie Roys, DO regarding development and update of comprehensive plan of care as evidenced by provider attestation and co-signature . Inter-disciplinary care team collaboration (see longitudinal plan of care) . Comprehensive medication review performed; medication list updated in electronic medical record  Hypertension (BP goal {CHL HP UPSTREAM Pharmacist BP ranges:2540891327}) -{US controlled/uncontrolled:25276} -Current treatment: . *** -Medications previously tried: ***  -Current  home readings: *** -Current dietary habits: *** -Current exercise habits: *** -{ACTIONS;DENIES/REPORTS:21021675::"Denies"} hypotensive/hypertensive symptoms -Educated on {CCM BP Counseling:25124} -Counseled to monitor BP at home ***, document, and provide log at future appointments -{CCMPHARMDINTERVENTION:25122}  Hyperlipidemia: (LDL goal < ***) -{US controlled/uncontrolled:25276} -Current treatment: . *** -Medications previously tried: ***  -Current dietary patterns: *** -Current exercise habits: *** -Educated on {CCM HLD Counseling:25126} -{CCMPHARMDINTERVENTION:25122}  Diabetes (A1c goal {A1c goals:23924}) -{US controlled/uncontrolled:25276} -Current medications: . *** -Medications previously tried: ***  -Current home glucose readings . fasting glucose: *** . post prandial glucose: *** -{ACTIONS;DENIES/REPORTS:21021675::"Denies"} hypoglycemic/hyperglycemic symptoms -Current meal patterns:  . breakfast: ***  . lunch: ***  . dinner: *** . snacks: *** . drinks: *** -Current exercise: *** -Educated on {CCM DM COUNSELING:25123} -Counseled to check feet daily and get yearly eye exams -{CCMPHARMDINTERVENTION:25122}  Depression/Anxiety (Goal: ***) -{US controlled/uncontrolled:25276} -Current treatment: . *** -Medications previously tried/failed: *** -PHQ9: *** -GAD7: *** -Connected with *** for mental health support -Educated on {CCM mental health counseling:25127} -{CCMPHARMDINTERVENTION:25122}   Patient Goals/Self-Care Activities . Patient will:  - {pharmacypatientgoals:24919}  Follow Up Plan: {CM FOLLOW UP QEHA:70658}

## 2020-08-14 ENCOUNTER — Telehealth: Payer: Self-pay | Admitting: Pharmacist

## 2020-08-14 NOTE — Chronic Care Management (AMB) (Signed)
  Care Management   Note  08/14/2020 Name: [name] MRN: [id number] DOB: [birth year]  Alison Hernandez is a 75 y.o. year old female who is a primary care patient of Dorcas Carrow, DO and is actively engaged with the care management team. I reached out to Caren Hazy by phone today to assist with re-scheduling a follow up visit with the RN Case Manager  Follow up plan: Telephone appointment with care management team member scheduled for: 10/02/2020  Penne Lash, RMA Care Guide, Embedded Care Coordination [facility]  [place], [place] [address] Direct Dial: [phone] [email or web address]

## 2020-08-14 NOTE — Chronic Care Management (AMB) (Signed)
    Chronic Care Management Pharmacy Assistant   Name: Alison Hernandez  MRN: [id number] DOB: November 28, 1945    Reason for Encounter: Reschedule appointment    Medications: Outpatient Encounter Medications as of 08/14/2020  Medication Sig  . aspirin EC 81 MG tablet Take 81 mg by mouth daily. Swallow whole.  . Blood Glucose Monitoring Suppl (ONE TOUCH ULTRA 2) w/Device KIT Test 3x a day  . buPROPion (WELLBUTRIN XL) 300 MG 24 hr tablet Take 1 tablet (300 mg total) by mouth daily.  . Dulaglutide (TRULICITY) 4.5 FC/1.4QH SOPN Inject 4.5 mg as directed once a week.  Marland Kitchen glucose blood test strip Use as instructed  . lisinopril-hydrochlorothiazide (ZESTORETIC) 20-25 MG tablet Take 1 tablet by mouth daily.  . meclizine (ANTIVERT) 25 MG tablet Take 1 tablet (25 mg total) by mouth 3 (three) times daily as needed for dizziness.  . metFORMIN (GLUCOPHAGE) 1000 MG tablet Take 1 tablet (1,000 mg total) by mouth 2 (two) times daily with a meal.  . Multiple Vitamin (MULTIVITAMIN) tablet Take 1 tablet by mouth daily.  . NYSTATIN powder APPLY  POWDER TOPICALLY 4 TIMES DAILY  . omeprazole (PRILOSEC) 20 MG capsule Take 2 capsules (40 mg total) by mouth daily.  . ondansetron (ZOFRAN-ODT) 8 MG disintegrating tablet DISSOLVE 1 TABLET IN MOUTH EVERY 8 HOURS AS NEEDED FOR NAUSEA OR VOMITING  . ONE TOUCH ULTRA TEST test strip Test 3x a day  . OneTouch Delica Lancets 60X MISC Test 3x a day  . rosuvastatin (CRESTOR) 10 MG tablet Take 1 tablet (10 mg total) by mouth daily.   No facility-administered encounter medications on file as of 08/14/2020.   Called patient to reschedule her CCM appointment. Patient rescheduled for 09/10/20 at 4:00PM for a phone visit. CPP notified.  [name] Clinical Pharmacist Assistant [phone]

## 2020-09-05 ENCOUNTER — Other Ambulatory Visit: Payer: Self-pay | Admitting: Family Medicine

## 2020-09-05 NOTE — Telephone Encounter (Signed)
Requested medication (s) are due for refill today: last refill  08/02/20  Requested medication (s) are on the active medication list: yes  Last refill:  08/02/20 #180 1 refill  Future visit scheduled: yes in 2 months   Notes to clinic:  need to clarify dose . Medication list has take 1 tablet daily and pharmacy requesting listed medication to take 2 tablets daily . Please clarify.     Requested Prescriptions  Pending Prescriptions Disp Refills   lisinopril-hydrochlorothiazide (ZESTORETIC) 20-25 MG tablet [Pharmacy Med Name: Lisinopril-hydroCHLOROthiazide 20-25 MG Oral Tablet] 180 tablet 0    Sig: Take 2 tablets by mouth once daily      Cardiovascular:  ACEI + Diuretic Combos Failed - 09/05/2020  4:56 PM      Failed - Cr in normal range and within 180 days    Creatinine, Ser  Date Value Ref Range Status  08/02/2020 1.11 (H) 0.57 - 1.00 mg/dL Final          Passed - Na in normal range and within 180 days    Sodium  Date Value Ref Range Status  08/02/2020 138 134 - 144 mmol/L Final          Passed - K in normal range and within 180 days    Potassium  Date Value Ref Range Status  08/02/2020 4.1 3.5 - 5.2 mmol/L Final          Passed - Ca in normal range and within 180 days    Calcium  Date Value Ref Range Status  08/02/2020 8.8 8.7 - 10.3 mg/dL Final          Passed - Patient is not pregnant      Passed - Last BP in normal range    BP Readings from Last 1 Encounters:  08/02/20 130/78          Passed - Valid encounter within last 6 months    Recent Outpatient Visits           1 month ago Type 2 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 disease, without long-term current use of insulin (HCC)   [facility] [place], [place], DO   4 months ago Dizziness   [facility] [place], [place], DO   5 months ago Dizziness   [facility] [place], [place], DO   5 months ago Dizziness   [facility] Caro [place], DO    6 months ago Type 2 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 disease, without long-term current use of insulin (HCC)   [facility] [place], Darl Householder, DO       Future Appointments             In 2 months Laural Benes, Oralia Rud, DO [facility], [facility]   In 2 months  [facility], [facility]

## 2020-09-06 NOTE — Telephone Encounter (Signed)
Scheduled 8/15

## 2020-09-06 NOTE — Telephone Encounter (Signed)
Please clarify directions ° ° °

## 2020-09-10 ENCOUNTER — Ambulatory Visit (INDEPENDENT_AMBULATORY_CARE_PROVIDER_SITE_OTHER): Payer: Medicare Other | Admitting: Pharmacist

## 2020-09-10 DIAGNOSIS — N182 Chronic kidney disease, stage 2 (mild): Secondary | ICD-10-CM | POA: Diagnosis not present

## 2020-09-10 DIAGNOSIS — E1122 Type 2 diabetes mellitus with diabetic chronic kidney disease: Secondary | ICD-10-CM

## 2020-09-10 DIAGNOSIS — I10 Essential (primary) hypertension: Secondary | ICD-10-CM

## 2020-09-11 ENCOUNTER — Telehealth: Payer: Self-pay | Admitting: Pharmacist

## 2020-09-11 NOTE — Telephone Encounter (Signed)
Alison Hernandez is requesting refills for lisinopril/hctz be sent to [facility].

## 2020-09-11 NOTE — Progress Notes (Signed)
Chronic Care Management Pharmacy  Name: Alison Hernandez  MRN: [id number] DOB: Aug 30, 1945   Chief Complaint/ HPI  Alison Hernandez,  75 y.o. , female presents for her Follow-Up CCM visit with the clinical pharmacist via telephone.  PCP : Valerie Roys, DO Patient Care Team: Valerie Roys, DO as PCP - General (Family Medicine) Tania Ade, MD as Consulting Physician (Orthopedic Surgery) Garrel Ridgel, [place] as Consulting Physician (Podiatry) Vanita Ingles, RN as Registered Nurse (General Practice)  Patient's chronic conditions include: Hypertension, Hyperlipidemia, Diabetes, Chronic Kidney Disease, Depression and Osteoarthritis   Office Visits: 08/02/20-[facility] (PCP)-Blood work 04/19/20- Dr. Wynetta Emery- dizziness- ?vertigo- PT referral, MRI normal 04/04/20- Dr. Wynetta Emery- blood work, dizziness-MRI, neuro referral-- Trulicity increased to 4.5 mg qweek  Consult Visit: 04/10/20- [place]- brain MRI w & w/o contrast- no infarction or ICH     Objective: Allergies  Allergen Reactions   Lovastatin Nausea And Vomiting   Bydureon [Exenatide] Rash   Losartan Rash    Medications:   Medication Sig   Blood Glucose Monitoring Suppl (ONE TOUCH ULTRA 2) w/Device KIT Test 3x a day   glucose blood test strip Use as instructed   meclizine (ANTIVERT) 25 MG tablet Take 1 tablet (25 mg total) by mouth 3 (three) times daily as needed for dizziness.   Multiple Vitamin (MULTIVITAMIN) tablet Take 1 tablet by mouth daily.   NYSTATIN powder APPLY  POWDER TOPICALLY 4 TIMES DAILY   ondansetron (ZOFRAN-ODT) 8 MG disintegrating tablet DISSOLVE 1 TABLET IN MOUTH EVERY 8 HOURS AS NEEDED FOR NAUSEA OR VOMITING   OneTouch Delica Lancets 94W MISC Test 3x a day   [DISCONTINUED] buPROPion (WELLBUTRIN XL) 300 MG 24 hr tablet Take 1 tablet (300 mg total) by mouth daily.   [DISCONTINUED] Dulaglutide (TRULICITY) 4.5 HQ/7.5FF SOPN Inject 4.5 mg as directed once a week.   [DISCONTINUED]  lisinopril-hydrochlorothiazide (ZESTORETIC) 20-25 MG tablet Take 1 tablet by mouth daily.   [DISCONTINUED] metFORMIN (GLUCOPHAGE) 1000 MG tablet Take 1 tablet (1,000 mg total) by mouth 2 (two) times daily with a meal.   [DISCONTINUED] omeprazole (PRILOSEC) 20 MG capsule Take 2 capsules (40 mg total) by mouth daily.   [DISCONTINUED] rosuvastatin (CRESTOR) 10 MG tablet Take 1 tablet by mouth once daily   ONE TOUCH ULTRA TEST test strip Test 3x a day   No facility-administered encounter medications on file as of 04/23/2020.    Wt Readings from Last 3 Encounters:  08/02/20 162 lb (73.5 kg)  04/19/20 155 lb (70.3 kg)  04/03/20 154 lb (69.9 kg)    Lab Results  Component Value Date   CREATININE 1.11 (H) 08/02/2020   BUN 27 08/02/2020   GFRNONAA 42 (L) 04/04/2020   GFRAA 48 (L) 04/04/2020   NA 138 08/02/2020   K 4.1 08/02/2020   CALCIUM 8.8 08/02/2020   CO2 21 08/02/2020     Current Diagnosis/Assessment:  SDOH Interventions    Flowsheet Row Most Recent Value  SDOH Interventions   Financial Strain Interventions --  [patient assistance, c3 referral placed for assistance with utilites and food]        Goals Addressed   CARE PLAN ENTRY (see longtitudinal plan of care for additional care plan information)  Current Barriers:  Diabetes: uncontrolled, most recent A1c 8.7%  Missed a couple doses of Trulicity waiting on PAP approval and delivery. Has been taking 4.5 mg Trulicity ~2 months and BG readings trending down.Current antihyperglycemic regimen: metformin 6384 mg BID, Trulicity 4.5 mg weekly. Patient assistance approved  for Trulicity Hx Scr increase w/ Jardiance; consider restart or Farxiga Hx rash w/ Bydureon Denies any issues w/ hypoglycemia Current glucose readings:  180-200s Cardiovascular risk reduction: Current hypertensive regimen: lisinopril 20/25 mg; 1 tablet daily; BP at goal <130/80 Current hyperlipidemia regimen: rosuvastatin 10 mg daily; LDL at goal  ~70 Depression: bupropion XL 300 mg daily  Max dose for GFR 15-101m/min  is 150 mg qd. High incidence ~21/26% of tremor and dizziness. Will discuss discontinuation taper with PCP if tremors return. Arthritis pain: reports worst in her hands. Tried diclofenac gel with no benefit. Not currently using any OTC pain relief.  Pharmacist Clinical Goal(s):  Over the next 90 days, patient will work with PharmD and primary care provider to address optimized medication management and medication access concerns  Interventions: Comprehensive medication review performed, medication list updated in electronic medical record Inter-disciplinary care team collaboration (see longitudinal plan of care) Will initiate PAP paperwork for increased Trulicity dose Will investigate options for CGM  Referral to care guide for financial assistance with utilities and food while patient unable to work.  Patient Self Care Activities:  Patient will check blood glucose as able  document, and provide at future appointments Patient will take medications as prescribed Patient to complete required portion of PAP application and return Patient will report any questions or concerns to provider   Please see past updates related to this goal by clicking on the "Past Updates" button in the selected goal    Diabetes   A1c goal <7%  Recent Relevant Labs: Lab Results  Component Value Date/Time   HGBA1C 8.7 (H) 08/02/2020 03:49 PM   HGBA1C 8.4 (H) 04/04/2020 08:36 AM   HGBA1C 7.5 11/28/2015 12:00 AM   MICROALBUR 30 (H) 05/19/2019 11:07 AM   MICROALBUR 80 (H) 11/18/2018 01:15 PM    BMP Latest Ref Rng & Units 08/02/2020 04/04/2020 03/21/2020  Glucose 65 - 99 mg/dL 149(H) 295(H) 407(H)  BUN 8 - 27 mg/dL 27 20 25   Creatinine 0.57 - 1.00 mg/dL 1.11(H) 1.27(H) 1.27(H)  BUN/Creat Ratio 12 - 28 24 16 20   Sodium 134 - 144 mmol/L 138 139 128(L)  Potassium 3.5 - 5.2 mmol/L 4.1 4.5 4.6  Chloride 96 - 106 mmol/L 99 96 93(L)  CO2 20 -  29 mmol/L 21 19(L) 20  Calcium 8.7 - 10.3 mg/dL 8.8 9.8 9.3  eGFR ~ 45 ml/min ckd- epi calculator  Last diabetic Eye exam: No results found for: HMDIABEYEEXA  Last diabetic Foot exam: No results found for: HMDIABFOOTEX   Checking BG: 180-200  Patient has failed these meds in past: Jardiance Scr increase, Bydureon -rash Patient is currently uncontrolled on the following medications: Trulicity 4.5  mg q week (patient assistance) Metformin 1000 mg bid  We discussed: Patient is now checking BG regularly. Still elevated above goal but trending down with max dose of Trulicituy. 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 BG readings 180-200s  Plan Recommend continue current medications.  Consider rechallenge with SGLT2i if A1C remains elevated. Can apply for PAP for Farxiga. Continue monitoring GFR and adjust metformin dose as indicated.  Hypertension/CKD   BP goal is:  <130/80  Office blood pressures are  BP Readings from Last 3 Encounters:  08/02/20 130/78  04/19/20 124/71  04/03/20 140/75   BMP Latest Ref Rng & Units 08/02/2020 04/04/2020 03/21/2020  Glucose 65 - 99 mg/dL 149(H) 295(H) 407(H)  BUN 8 - 27 mg/dL 27 20 25   Creatinine 0.57 -  1.00 mg/dL 1.11(H) 1.27(H) 1.27(H)  BUN/Creat Ratio 12 - 28 24 16 20   Sodium 134 - 144 mmol/L 138 139 128(L)  Potassium 3.5 - 5.2 mmol/L 4.1 4.5 4.6  Chloride 96 - 106 mmol/L 99 96 93(L)  CO2 20 - 29 mmol/L 21 19(L) 20  Calcium 8.7 - 10.3 mg/dL 8.8 9.8 9.3  eGFR~ 45 ml/min  Patient has failed these meds in the past: NA Patient is currently controlled on the following medications:  Lisinopril/HCTZ 20/25 mg qd  We discussed Diet and exercise. Reviewed renal function.   Plan  Continue current medications     Hyperlipidemia   LDL goal < 70  Last lipids Lab Results  Component Value Date   CHOL 142 08/02/2020   HDL 51 08/02/2020   LDLCALC 67 08/02/2020   TRIG 140 08/02/2020   Hepatic Function Latest  Ref Rng & Units 08/02/2020 04/04/2020 10/18/2019  Total Protein 6.0 - 8.5 g/dL 6.6 7.3 6.8  Albumin 3.7 - 4.7 g/dL 4.3 4.5 4.4  AST 0 - 40 IU/L 11 7 12   ALT 0 - 32 IU/L 8 14 8   Alk Phosphatase 44 - 121 IU/L 78 105 79  Total Bilirubin 0.0 - 1.2 mg/dL 0.3 0.4 0.4     The 10-year ASCVD risk score Mikey Bussing DC Jr., et al., 2013) is: 30%   Values used to calculate the score:     Age: 55 years     Sex: Female     Is Non-Hispanic African American: No     Diabetic: Yes     Tobacco smoker: No     Systolic Blood Pressure: 270 mmHg     Is BP treated: Yes     HDL Cholesterol: 51 mg/dL     Total Cholesterol: 142 mg/dL   Patient has failed these meds in past: NA Patient is currently controlled on the following medications:  Rosuvastatin 10 mg qd Aspirin ec 81 mg qd  We discussed:  Diet and exercise.  Plan  Continue current medications and control with diet.  Osteopenia / Osteoporosis   Last DEXA Scan: No record available   No results found for: VD25OH   Patient has failed these meds in past: NA Patient is currently  on the following medications:  NONE  We discussed:  Recommend [phone] units of vitamin D daily. Recommend 1200 mg of calcium daily from dietary and supplemental sources. Recommend weight-bearing and muscle strengthening exercises for building and maintaining bone density.  Recommend DEXA scan . Patient dizziness has resolved and denies any further falls.  Plan  Recommend DEXA scan. Recommend vitamin d level with next labs. Recommend calcium and vitamin d supplementation 525m/400iu bid.      Medication Management   [facility]:  [facility] [place] [address] [address]Phone: [phone]: [phone]         Follow up: 2 month phone visit  JJunita Push HKenton KingfisherPharmD, BViborgMBlue Mountain [phone]

## 2020-09-13 ENCOUNTER — Other Ambulatory Visit: Payer: Self-pay | Admitting: Family Medicine

## 2020-09-13 MED ORDER — LISINOPRIL-HYDROCHLOROTHIAZIDE 20-25 MG PO TABS: 1.0000 | ORAL_TABLET | Freq: Every day | ORAL | 1 refills | Status: DC

## 2020-09-23 ENCOUNTER — Other Ambulatory Visit: Payer: Self-pay | Admitting: Family Medicine

## 2020-09-23 NOTE — Telephone Encounter (Signed)
last RF 08/02/20 #90 1 RF-- refill request too soon

## 2020-09-28 NOTE — Patient Instructions (Signed)
Visit Information  It was a pleasure speaking with you today. Thank you for letting me be part of your clinical team. Please call with any questions or concerns.    Goals Addressed               This Visit's Progress     Monitor and Manage My Blood Sugar-Diabetes Type 2   Not on track     Obtain Eye Exam-Diabetes Type 2        Follow Up Date 3 months    - schedule appointment with eye doctor    Why is this important?   Eye check-ups are important when you have diabetes.  Vision loss can be prevented.    Notes:        PharmD "I want to stay healthy (pt-stated)        CARE PLAN ENTRY (see longtitudinal plan of care for additional care plan information)  Current Barriers:  Diabetes: uncontrolled, most recent A1c 8.7%  Missed a couple doses of Trulicity waiting on PAP approval and delivery. Has been taking 4.5 mg Trulicity ~2 months and BG readings trending down.Current antihyperglycemic regimen: metformin 1000 mg BID, Trulicity 4.5 mg weekly. Patient assistance approved for Trulicity Hx Scr increase w/ Jardiance; consider restart or Farxiga Hx rash w/ Bydureon Denies any issues w/ hypoglycemia Current glucose readings:  180-200s Cardiovascular risk reduction: Current hypertensive regimen: lisinopril 20/25 mg; 1 tablet daily; BP at goal <130/80 Current hyperlipidemia regimen: rosuvastatin 10 mg daily; LDL at goal ~70 Depression: bupropion XL 300 mg daily  Max dose for GFR 15-60ml/min  is 150 mg qd. High incidence ~21/26% of tremor and dizziness. Will discuss discontinuation taper with PCP if tremors return. Arthritis pain: reports worst in her hands. Tried diclofenac gel with no benefit. Not currently using any OTC pain relief.  Pharmacist Clinical Goal(s):  Over the next 90 days, patient will work with PharmD and primary care provider to address optimized medication management and medication access concerns  Interventions: Comprehensive medication review performed,  medication list updated in electronic medical record Inter-disciplinary care team collaboration (see longitudinal plan of care) Will initiate PAP paperwork for increased Trulicity dose Will investigate options for CGM  Referral to care guide for financial assistance with utilities and food while patient unable to work.  Patient Self Care Activities:  Patient will check blood glucose as able  document, and provide at future appointments Patient will take medications as prescribed Patient to complete required portion of PAP application and return Patient will report any questions or concerns to provider   Please see past updates related to this goal by clicking on the "Past Updates" button in the selected goal           Patient verbalizes understanding of instructions provided today and agrees to view in MyChart.   Telephone follow up appointment with pharmacy team member scheduled for: 3-4 months (need to schedule)  [place] Pod. Tiburcio Pea PharmD, BCPS Clinical Pharmacist [phone]

## 2020-10-02 ENCOUNTER — Telehealth: Payer: Self-pay | Admitting: General Practice

## 2020-10-02 ENCOUNTER — Telehealth: Payer: Medicare Other

## 2020-10-02 NOTE — Telephone Encounter (Signed)
  Care Management   Follow Up Note   10/02/2020 Name: Alison Hernandez MRN: [id number] DOB: April 04, 1945   Referred by: Dorcas Carrow, DO Reason for referral : Chronic Care Management (RNCM: Follow up for Chronic Disease Management and Care Coordination Needs )   A second unsuccessful telephone outreach was attempted today. The patient was referred to the case management team for assistance with care management and care coordination.   Follow Up Plan: A HIPPA compliant phone message was left for the patient providing contact information and requesting a return call.   Alto Denver RN, MSN, CCM Community Care Coordinator [place]  [facility] Mobile: [phone]

## 2020-10-11 ENCOUNTER — Telehealth: Payer: Self-pay

## 2020-10-11 NOTE — Chronic Care Management (AMB) (Signed)
  Care Management   Note  10/11/2020 Name: RYLAN KAUFMANN MRN: [id number] DOB: 13-Jul-1945  ALEEYA VEITCH is a 75 y.o. year old female who is a primary care patient of Dorcas Carrow, DO and is actively engaged with the care management team. I reached out to Caren Hazy by phone today to assist with re-scheduling a follow up visit with the RN Case Manager  Follow up plan: Unsuccessful telephone outreach attempt made. A HIPAA compliant phone message was left for the patient providing contact information and requesting a return call.  The care management team will reach out to the patient again over the next 7 days.  If patient returns call to provider office, please advise to call Embedded Care Management Care Guide Penne Lash at [phone]  Penne Lash, RMA Care Guide, Embedded Care Coordination [facility]  [place], [place] [address] Direct Dial: [phone] [email or web address] Website: [email or web address]

## 2020-10-11 NOTE — Chronic Care Management (AMB) (Signed)
  Care Management   Note  10/11/2020 Name: SHEMAIAH ROUND MRN: [id number] DOB: [birth year]  KAMEISHA MALICKI is a 74 y.o. year old female who is a primary care patient of Dorcas Carrow, DO and is actively engaged with the care management team. I reached out to Caren Hazy by phone today to assist with re-scheduling a follow up visit with the RN Case Manager  Follow up plan: Telephone appointment with care management team member scheduled for:10/26/2020  Penne Lash, RMA Care Guide, Embedded Care Coordination [facility]  [place], [place] [address] Direct Dial: [phone] [email or web address]: [email or web address]

## 2020-10-25 ENCOUNTER — Telehealth: Payer: Medicare Other

## 2020-10-25 NOTE — Chronic Care Management (AMB) (Cosign Needed)
error 

## 2020-10-26 ENCOUNTER — Telehealth: Payer: Medicare Other | Admitting: General Practice

## 2020-10-26 ENCOUNTER — Ambulatory Visit (INDEPENDENT_AMBULATORY_CARE_PROVIDER_SITE_OTHER): Payer: Medicare Other | Admitting: General Practice

## 2020-10-26 DIAGNOSIS — F3341 Major depressive disorder, recurrent, in partial remission: Secondary | ICD-10-CM

## 2020-10-26 DIAGNOSIS — I10 Essential (primary) hypertension: Secondary | ICD-10-CM

## 2020-10-26 DIAGNOSIS — N182 Chronic kidney disease, stage 2 (mild): Secondary | ICD-10-CM

## 2020-10-26 DIAGNOSIS — E782 Mixed hyperlipidemia: Secondary | ICD-10-CM

## 2020-10-26 DIAGNOSIS — E1122 Type 2 diabetes mellitus with diabetic chronic kidney disease: Secondary | ICD-10-CM | POA: Diagnosis not present

## 2020-10-26 NOTE — Patient Instructions (Signed)
Visit Information  PATIENT GOALS:  Goals Addressed             This Visit's Progress    RNCM: Monitor and Manage My Blood Sugar-Diabetes Type 2       Timeframe:  Long-Range Goal Priority:  High Start Date:       10-26-2020                      Expected End Date: 10-26-2021                      Follow Up Date 01/11/2021    - check blood sugar at prescribed times - check blood sugar before and after exercise - check blood sugar if I feel it is too high or too low - enter blood sugar readings and medication or insulin into daily log - take the blood sugar log to all doctor visits - take the blood sugar meter to all doctor visits    Why is this important?   Checking your blood sugar at home helps to keep it from getting very high or very low.  Writing the results in a diary or log helps the doctor know how to care for you.  Your blood sugar log should have the time, date and the results.  Also, write down the amount of insulin or other medicine that you take.  Other information, like what you ate, exercise done and how you were feeling, will also be helpful.     Notes: 10-26-2020: The patient got approved for trulicity for the rest of the year of 2022. Ask about if she would need to reapply for this for 2023. Advised that she would likely have to do this but will collaborate with the pharm D concerning this. The patient also had a power loss last week for 2 hours and wanted to make sure her trulicity was still good. She kept it in the fridge and did not open the door. Advised the trulicity should still be good. The patient states her blood sugars have been in the 100  range, highest she has had was 217 and that was when she was at work the other day and the air conditioner was broken and she had vomiting x 4. Education and support given. Review of fasting blood sugars or <130 and post prandial of <180. Will continue to monitor.      RNCM: Track and Manage My Blood Pressure-Hypertension        Timeframe:  Long-Range Goal Priority:  Medium Start Date:  10-26-2020                           Expected End Date:     10-26-2021                  Follow Up Date 01/11/2021    - check blood pressure 3 times per week - choose a place to take my blood pressure (home, clinic or office, retail store) - write blood pressure results in a log or diary    Why is this important?   You won't feel high blood pressure, but it can still hurt your blood vessels.  High blood pressure can cause heart or kidney problems. It can also cause a stroke.  Making lifestyle changes like losing a little weight or eating less salt will help.  Checking your blood pressure at home and at different times of  the day can help to control blood pressure.  If the doctor prescribes medicine remember to take it the way the doctor ordered.  Call the office if you cannot afford the medicine or if there are questions about it.     Notes: 10-26-2020: The patient checks her blood pressure sometimes at home. Reviewed parameters of blood pressures of <150 systolic and <90 diastolic. Discussed bp higher than that range puts the patient at increase risk of heart attack and stroke. The patient verbalized the last blood pressure reading was 130/85. Will continue to monitor for changes.  BP Readings from Last 3 Encounters:  08/02/20 130/78  04/19/20 124/71  04/03/20 140/75           Patient verbalizes understanding of instructions provided today and agrees to view in MyChart.   Telephone follow up appointment with care management team member scheduled for: 01-11-2021 at 1145 am  Alto Denver RN, MSN, CCM Community Care Coordinator [place]  [facility] Mobile: [phone]

## 2020-10-26 NOTE — Chronic Care Management (AMB) (Signed)
Chronic Care Management   CCM RN Visit Note  10/26/2020 Name: Alison Hernandez MRN: [id number] DOB: [birth year]  Subjective: Alison Hernandez is a 75 y.o. year old female who is a primary care patient of Valerie Roys, DO. The care management team was consulted for assistance with disease management and care coordination needs.    Engaged with patient by telephone for follow up visit in response to provider referral for case management and/or care coordination services.   Consent to Services:  The patient was given information about Chronic Care Management services, agreed to services, and gave verbal consent prior to initiation of services.  Please see initial visit note for detailed documentation.   Patient agreed to services and verbal consent obtained.   Assessment: Review of patient past medical history, allergies, medications, health status, including review of consultants reports, laboratory and other test data, was performed as part of comprehensive evaluation and provision of chronic care management services.   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 Swallow whole.   Blood Glucose Monitoring Suppl (ONE TOUCH ULTRA 2) w/Device KIT Test 3x a day   buPROPion (WELLBUTRIN XL) 300 MG 24 hr tablet Take 1 tablet (300 mg total) by mouth daily.   glucose blood test strip Use as instructed   lisinopril-hydrochlorothiazide (ZESTORETIC) 20-25 MG tablet Take 1 tablet by mouth daily.    meclizine (ANTIVERT) 25 MG tablet Take 1 tablet (25 mg total) by mouth 3 (three) times daily as needed for dizziness.   metFORMIN (GLUCOPHAGE) 1000 MG tablet Take 1 tablet (1,000 mg total) by mouth 2 (two) times daily with a meal.   Multiple Vitamin (MULTIVITAMIN) tablet Take 1 tablet by mouth daily.   NYSTATIN powder APPLY  POWDER TOPICALLY 4 TIMES DAILY   omeprazole (PRILOSEC) 20 MG capsule Take 2 capsules (40 mg total) by mouth daily.   ondansetron (ZOFRAN-ODT) 8 MG disintegrating tablet DISSOLVE 1 TABLET IN MOUTH EVERY 8 HOURS AS NEEDED FOR NAUSEA OR VOMITING   ONE TOUCH ULTRA TEST test strip Test 3x a day   OneTouch Delica Lancets 62X MISC Test 3x a day   rosuvastatin (CRESTOR) 10 MG tablet Take 1 tablet (10 mg total) by mouth daily.   No facility-administered encounter medications on file as of 10/26/2020.    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  Has submitted paperwork to pharmacist.     Long-Range Goal: RNCM: Medication Adherence- Trulicity for management of DM   Start Date: 03/20/2020  Expected End Date: 05/19/2020  This Visit's Progress: On track  Priority: High  Note:   Current Barriers:  Knowledge Deficits related to calling the office to see if any samples of Turlicity are available and maintaining contact with the CCM pharmacist for expressed needs of needing  help with obtaining Trulicity to take as prescribed  Care Coordination needs related to patient being out of Trulicity  in a patient with DM that is uncontrolled  Chronic Disease Management support and education needs related to DM  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 Blood sugars are elevated. 10-26-2020: The patient has been approved for Trulicity to receive free for the rest of 2022. The patient ask about process for 2023 and information provided, will collaborate with the pharm D.  Unable to self administer medications as prescribed Does not adhere to prescribed medication regimen Does not maintain contact with provider office Does not contact provider office for questions/concerns  Nurse Case Manager Clinical Goal(s):   patient will verbalize understanding of plan for working with the pcp and CCM pharmacist to get needed supply of Trulicity without missing doses of medication patient will attend all scheduled medical appointments: 11-05-2020 at [place] am patient will work with CM team pharmacist to get needed paperwork completed for help with Trulicity supply- [id number]: Has been approved for Trulicity for the year 9323 to receive free   the patient will demonstrate ongoing self health care management ability as evidenced by being proactive in her care and notifying the CCM team when she has questions/concerns/needs  Interventions:  1:1 collaboration with Valerie Roys, DO regarding development and update of comprehensive plan of care as evidenced by provider attestation and co-signature Inter-disciplinary care team collaboration (see longitudinal plan of care) Evaluation of current treatment plan related to DM management and the use of Trulicity and patient's adherence to plan as established by provider.10-26-2020: The  patient has received approval to get her Trulicity for the year 5573 for free. She is taking as prescribed. The patient did inquire about Trulicity for 2202 and advised she would likely have to reapply but would collaborate with the pcp. The patient also states that her power went off for 2 hours there other day and her Trulicity was in the fridge, she did not open the door but ask if the medication was still good. Advised the medication should be fine but would clarify with the pharm D.  Advised patient to take calls from the CCM pharmacist, call when messages are left and communicate needs to the CCM team.  Provided education to patient re: RNCM contacting the pharmacist and Dr. Wynetta Emery with expressed needs Reviewed medications with patient and discussed the patient is not being compliant with Trulicity. Out of medication x 2 weeks. Can not afford 224.00 to refill. States she filled out paperwork in October but has not heard back from the paperwork being submitted. 10-26-2020: Is taking as prescribed will collaborate with the pharm D about applying for assistance for 2023 Collaborated with CCM pharmacist  regarding help with patient getting Trulicity- [id number]: Pharm D is active in the patients plan of care Pharmacy referral for cost prohibitive needs related to not being able  to afford Trulicity and being out x 2 weeks. 10-26-2020: The patient is working with the pharm D - barriers to medication adherence identified - completion of financial assistance requests facilitated - medication list reviewed - medication-adherence assessment completed - medication reminder use encouraged - self-management plan initiated or updated - strategies for improving adherence encouraged - understanding of current medications assessed  Patient Goals/Self-Care Activities Over the next 120 days, patient will:  - Patient will self administer medications as prescribed Patient will attend all scheduled provider  appointments Patient will call pharmacy for medication refills Patient will call provider office for new concerns or questions  Follow Up Plan: Telephone follow up appointment with care management team member scheduled for: 01-11-2021 at 11:45am        Task: RNCM: Optimize Medication Use Completed 10/26/2020  Outcome: Positive  Note:   Care Management Activities:    - barriers to medication adherence identified - completion of financial assistance requests facilitated - medication list reviewed - medication-adherence assessment completed - medication reminder use encouraged - self-management plan initiated or updated - strategies for improving adherence encouraged - understanding of current medications assessed    Notes: Pharmacist is working with the patient also    Care Plan : RNCM:Diabetes Type 2 (Adult)  Updates made by Vanita Ingles since 10/26/2020 12:00 AM     Problem: RNCM: Glycemic Management (Diabetes, Type 2)   Priority: High  Note:   Uncontrolled DM    Long-Range Goal: RNCM: Disease Management and Care Coordination of DM   Start Date: 03/20/2020  Expected End Date: 09/20/2021  This Visit's Progress: On track  Priority: High  Note:   Objective:  Lab Results  Component Value Date   HGBA1C 8.7 (H) 08/02/2020    Lab Results  Component Value Date   CREATININE 1.11 (H) 08/02/2020   CREATININE 1.27 (H) 04/04/2020   CREATININE 1.27 (H) 03/21/2020    No results found for: EGFR Current Barriers:  Knowledge Deficits related to basic Diabetes pathophysiology and self care/management Knowledge Deficits related to medications used for management of diabetes Difficulty obtaining or cannot afford medications Financial Constraints Limited Social Support Unable to independently manage DM and inability to take Trulicity as prescribed due to being cost prohibitive Unable to self administer medications as prescribed Does not adhere to provider recommendations re:  taking medications and calling the office when she is out of Trulicity and can not afford.  Does not adhere to prescribed medication regimen Does not contact provider office for questions/concerns Case Manager Clinical Goal(s):  patient will demonstrate improved adherence to prescribed treatment plan for diabetes self care/management as evidenced by:  daily monitoring and recording of CBG  adherence to ADA/ carb modified diet adherence to prescribed medication regimen Interventions:  Provided education to patient about basic DM disease process Reviewed medications with patient and discussed importance of medication adherence. 10-26-2020: The patient is compliant with her medications regimen at this time. Has Trulicity paid for for the remainder of 2022 Discussed plans with patient for ongoing care management follow up and provided patient with direct contact information for care management team Provided patient with written educational materials related to hypo and hyperglycemia and importance of correct treatment Reviewed scheduled/upcoming provider appointments including: 11-05-2020 at [place] am Advised patient, providing education and rationale, to check cbg bid and record, calling pcp for findings outside established parameters.  10-26-2020: The patient is checking blood sugars regularly and recording. States that her range has been 100 to 217. The patient  states she believes the 217 is because she was at work with no air conditioner and she was throwing up. She states she has not had any issues since then. Denies any lows at this time.  Referral made to pharmacy team for assistance with Trulicity needs. 10-26-2020: Working with pharm D Review of patient status, including review of consultants reports, relevant laboratory and other test results, and medications completed. - barriers to adherence to treatment plan identified - blood glucose monitoring encouraged - blood glucose readings reviewed;  10-26-2020: Range has been 100 to 217 - individualized medical nutrition therapy provided- 10-26-2020: Is adherent to heart heatlhy/ADA diet - mutual A1C goal set or reviewed: 10-26-2020: Sees the pcp on 11-05-2020, will have new blood work. Last A1C elevated. Reviewed goal of A1C to be 7.0 or less. The patient is hopeful that her A1C is dropping.  - resources required to improve adherence to care identified - self-awareness of signs/symptoms of hypo or hyperglycemia encouraged - use of blood glucose monitoring log promoted Patient Goals/Self-Care Activities Over the next 120 days, patient will:  - UNABLE to independently manage DM Self administers oral medications as prescribed Self administers injectable DM medication (Trulicity) as prescribed Attends all scheduled provider appointments Checks blood sugars as prescribed and utilize hyper and hypoglycemia protocol as needed Adheres to prescribed ADA/carb modified Follow Up Plan: Telephone follow up appointment with care management team member scheduled for: [id number] at 11:45 am    Task: RNCM: Alleviate Barriers to Glycemic Management Completed 10/26/2020  Outcome: Positive  Note:   Care Management Activities:    - barriers to adherence to treatment plan identified - blood glucose monitoring encouraged - blood glucose readings reviewed - individualized medical nutrition therapy provided - mutual A1C goal set or reviewed - resources required to improve adherence to care identified - self-awareness of signs/symptoms of hypo or hyperglycemia encouraged - use of blood glucose monitoring log promoted        Care Plan : RNCM: Depression Management  Updates made by Vanita Ingles since 10/26/2020 12:00 AM     Problem: RNCM: Depression Identification (Depression)   Priority: Medium  Note:   The patient has a history of depression    Long-Range Goal: RNCM: Depression Management   Start Date: 03/20/2020  Expected End Date: 09/20/2020  This  Visit's Progress: On track  Priority: Medium  Note:   Current Barriers:  Knowledge Deficits related to effective management of depression Chronic Disease Management support and education needs related to depression Lacks caregiver support.  Unable to independently depression Unable to self administer medications as prescribed Lacks social connections Does not contact provider office for questions/concerns  Nurse Case Manager Clinical Goal(s):   patient will verbalize understanding of plan for management of depression patient will attend all scheduled medical appointments: 11-05-2020 with pcp  the patient will demonstrate ongoing self health care management ability as evidenced by mood stable and no new concerns related to depression  Interventions:  1:1 collaboration with Valerie Roys, DO regarding development and update of comprehensive plan of care as evidenced by provider attestation and co-signature Inter-disciplinary care team collaboration (see longitudinal plan of care) Evaluation of current treatment plan related to depression and patient's adherence to plan as established by provider. 10-26-2020: The patient feels she is at a good place with her health and well being at this time. States her depression is under control and she feels a lot better about things. Will continue to monitor.  Advised patient to call  with changes in mood or depressed state Provided education to patient re: depression and effective management  Discussed plans with patient for ongoing care management follow up and provided patient with direct contact information for care management team - anxiety screen reviewed - depression screen reviewed - medication list reviewed  Patient Goals/Self-Care Activities Over the next 120 days, patient will:  - Patient will self administer medications as prescribed Patient will attend all scheduled provider appointments Patient will call provider office for new concerns  or questions  Follow Up Plan: Telephone follow up appointment with care management team member scheduled for: 01-11-2021 at 11:45 am        Task: RNCM: Identify Depressive Symptoms and Facilitate Treatment Completed 10/26/2020  Outcome: Positive  Note:   Care Management Activities:    - anxiety screen reviewed - depression screen reviewed - medication list reviewed        Care Plan : RNCM: Management of HLD  Updates made by Vanita Ingles since 10/26/2020 12:00 AM     Problem: RNCM: Management of HLD and associated issues related to HLD/Cardiac function   Priority: Medium     Long-Range Goal: RNCM: Effective management of HLD   Start Date: 03/20/2020  Expected End Date: 09/20/2021  This Visit's Progress: On track  Priority: Medium  Note:   Current Barriers:  Knowledge Deficits related to needed follow up for abnormal mammogram with abnormality in right breast. Patient states she needs a referral for follow up and has not heard back from the office  Care Coordination needs related to need for follow up for [place] of right breast and new onset of dizziness over the last 3 weeks in a patient with HLD and other Chronic Conditions Chronic Disease Management support and education needs related to HLD and associated chronic conditions  Lacks caregiver support.  Film/video editor.  Non-adherence to prescribed medication regimen Difficulty obtaining medications Unable to independently manage HLD, follow up needs for abnormal mammogram and new onset of dizziness Unable to self administer medications as prescribed Does not adhere to provider recommendations re: follow up [place] for abnormal mammogram Does not adhere to prescribed medication regimen Lacks social connections Does not maintain contact with provider office Does not contact provider office for questions/concerns  Nurse Case Manager Clinical Goal(s):   patient will verbalize understanding of plan for HLD and following up  with the pcp for dizziness and abnormal mammogram findings  patient will work with [facility], CCM team and pcp to address needs related to medication compliance, expressed needs to be addressed related to abnormal mammogram and new onset of dizziness.  , patient will attend all scheduled medical appointments: next appointment with the pcp on 11-05-2020  patient will demonstrate improved health management independence as evidenced bycompliance with the plan of care established by the provider, cholesterol levels stable and stabilized condition  the patient will demonstrate ongoing self health care management ability as evidenced by having the means to get needed medications, call provider office for new changes or concerns, and work with the CCM team to meet her health and wellness goals.   Interventions:  1:1 collaboration with Valerie Roys, DO regarding development and update of comprehensive plan of care as evidenced by provider attestation and co-signature Inter-disciplinary care team collaboration (see longitudinal plan of care) Evaluation of current treatment plan related to HLD  and patient's adherence to plan as established by provider.10-26-2020: The patient is doing well and denies any issue with HLD management. She still has  not gotten her mammogram. Reminded the patient to get her mammogram for follow up Advised patient to write down questions for the pcp to address at next pcp visit, especially concerning new onset of dizziness and abnormal mammogram with suggestion to have further testing of right breast.  Provided education to patient re: heart healthy diet and adherence. Possiblity of patient having orthostatic hypotension, review of safety- patient had a fall 2 weeks ago without injury. Did not go to be evaluated. 10-26-2020: The patient denies any new falls or dizziness at this time. Feels she is at a good place.  Reviewed medications with patient and discussed compliance. 10-26-2020: States  that she is taking her medications as directed.  Discussed plans with patient for ongoing care management follow up and provided patient with direct contact information for care management team Provided patient with HLD educational materials related to how to best control HLD and dietary restrictions through the my chart system Reviewed scheduled/upcoming provider appointments including: 11-05-2020 at 0940 am Patient Goals/Self-Care Activities Over the next 120 days, patient will:  - Patient will self administer medications as prescribed Patient will attend all scheduled provider appointments Patient will call pharmacy for medication refills Patient will call provider office for new concerns or questions  Follow Up Plan: Telephone follow up appointment with care management team member scheduled for: 01-11-2021 at 11:45 am        Care Plan : RNCM: Hypertension (Adult)  Updates made by Vanita Ingles since 10/26/2020 12:00 AM     Problem: RNCM: Disease Progression (Hypertension)   Priority: Medium     Long-Range Goal: RNCM: Disease Progression Prevented or Minimized   Start Date: 10/26/2020  Expected End Date: 10/26/2021  This Visit's Progress: On track  Priority: Medium  Note:   Objective:  Last practice recorded BP readings:  BP Readings from Last 3 Encounters:  08/02/20 130/78  04/19/20 124/71  04/03/20 140/75   Most recent eGFR/CrCl:  Lab Results  Component Value Date   EGFR 52 (L) 08/02/2020    No components found for: CRCL Current Barriers:  Knowledge Deficits related to basic understanding of hypertension pathophysiology and self care management Knowledge Deficits related to understanding of medications prescribed for management of hypertension Difficulty obtaining medications Unable to independently manage HTN Does not contact provider office for questions/concerns Case Manager Clinical Goal(s):  patient will verbalize understanding of plan for hypertension  management patient will attend all scheduled medical appointments: 11-05-2020 at [place] am patient will demonstrate improved adherence to prescribed treatment plan for hypertension as evidenced by taking all medications as prescribed, monitoring and recording blood pressure as directed, adhering to low sodium/DASH diet patient will demonstrate improved health management independence as evidenced by checking blood pressure as directed and notifying PCP if SBP>150 or DBP > 90, taking all medications as prescribe, and adhering to a low sodium diet as discussed. patient will verbalize basic understanding of hypertension disease process and self health management plan as evidenced by compliance with medications, compliance with heart healthy/Ada diet and working with the CCM team to effectively manage health and well being.  Interventions:  Collaboration with Valerie Roys, DO regarding development and update of comprehensive plan of care as evidenced by provider attestation and co-signature Inter-disciplinary care team collaboration (see longitudinal plan of care) Evaluation of current treatment plan related to hypertension self management and patient's adherence to plan as established by provider. Provided education to patient re: stroke prevention, s/s of heart attack and stroke, DASH diet, complications  of uncontrolled blood pressure Reviewed medications with patient and discussed importance of compliance Discussed plans with patient for ongoing care management follow up and provided patient with direct contact information for care management team Advised patient, providing education and rationale, to monitor blood pressure daily and record, calling PCP for findings outside established parameters.  Reviewed scheduled/upcoming provider appointments including: 11-05-2020 at 0940 am Self-Care Activities: - Self administers medications as prescribed Attends all scheduled provider appointments Calls  provider office for new concerns, questions, or BP outside discussed parameters Checks BP and records as discussed Follows a low sodium diet/DASH diet Patient Goals: - check blood pressure 3 times per week - choose a place to take my blood pressure (home, clinic or office, retail store) - write blood pressure results in a log or diary - agree on reward when goals are met - agree to work together to make changes - ask questions to understand - have a family meeting to talk about healthy habits - learn about high blood pressure  Follow Up Plan: Telephone follow up appointment with care management team member scheduled for: 01-11-2021 at 1145 am    Task: Alleviate Barriers to Hypertension Treatment Completed 10/26/2020  Outcome: Positive  Note:   Care Management Activities:    - difficulty of making life-long changes acknowledged - healthy diet promoted - healthy family lifestyle promoted - medication side effects managed - pain assessed and managed - patient response to treatment assessed - quality of sleep assessed - reduction of dietary sodium encouraged - reduction in sedentary activities encouraged - response to pharmacologic therapy monitored - sleep hygiene techniques encouraged    Notes:      Plan:Telephone follow up appointment with care management team member scheduled for:  01-11-2021 at [place] am  Noreene Larsson RN, MSN, [facility] Mobile: [phone]

## 2020-10-29 ENCOUNTER — Other Ambulatory Visit: Payer: Self-pay | Admitting: Family Medicine

## 2020-11-05 ENCOUNTER — Other Ambulatory Visit: Payer: Self-pay

## 2020-11-05 ENCOUNTER — Ambulatory Visit (INDEPENDENT_AMBULATORY_CARE_PROVIDER_SITE_OTHER): Payer: Medicare Other | Admitting: Family Medicine

## 2020-11-05 ENCOUNTER — Encounter: Payer: Self-pay | Admitting: Family Medicine

## 2020-11-05 VITALS — BP 124/64 | HR 99 | Temp 98.4°F | Ht 65.98 in | Wt 154.2 lb

## 2020-11-05 DIAGNOSIS — E1122 Type 2 diabetes mellitus with diabetic chronic kidney disease: Secondary | ICD-10-CM | POA: Diagnosis not present

## 2020-11-05 DIAGNOSIS — N182 Chronic kidney disease, stage 2 (mild): Secondary | ICD-10-CM

## 2020-11-05 LAB — BAYER DCA HB A1C WAIVED: HB A1C (BAYER DCA - WAIVED): 7 % — ABNORMAL HIGH (ref <7.0)

## 2020-11-05 MED ORDER — ONETOUCH ULTRA VI STRP: ORAL_STRIP | 12 refills | Status: DC

## 2020-11-05 MED ORDER — ONETOUCH DELICA LANCETS 33G MISC: 12 refills | Status: DC

## 2020-11-05 NOTE — Assessment & Plan Note (Signed)
Doing well with A1c of 7.0. Continue diet and exercise. Continue current regimen. Call with any concerns.

## 2020-11-05 NOTE — Progress Notes (Signed)
BP 124/64   Pulse 99   Temp 98.4 F (36.9 C) (Oral)   Ht 5' 5.98" (1.676 m)   Wt 154 lb 3.2 oz (69.9 kg)   SpO2 98%   BMI 24.90 kg/m    Subjective:    Patient ID: Alison Hernandez, female    DOB: Aug 15, 1945, 75 y.o.   MRN: [id number]  HPI: Alison Hernandez is a 75 y.o. female  Chief Complaint  Patient presents with   Hypertension   Diabetes   DIABETES Hypoglycemic episodes:no Polydipsia/polyuria: no Visual disturbance: no Chest pain: no Paresthesias: no Glucose Monitoring: yes  Accucheck frequency:  occasionally Taking Insulin?: no Blood Pressure Monitoring: not checking Retinal Examination: Up to Date Foot Exam:  not up to date Diabetic Education: Completed Pneumovax: Up to Date Influenza: Not up to Date Aspirin: no  Relevant past medical, surgical, family and social history reviewed and updated as indicated. Interim medical history since our last visit reviewed. Allergies and medications reviewed and updated.  Review of Systems  Constitutional: Negative.   Respiratory: Negative.    Cardiovascular: Negative.   Gastrointestinal: Negative.   Musculoskeletal: Negative.   Neurological: Negative.   Psychiatric/Behavioral: Negative.     Per HPI unless specifically indicated above     Objective:    BP 124/64   Pulse 99   Temp 98.4 F (36.9 C) (Oral)   Ht 5' 5.98" (1.676 m)   Wt 154 lb 3.2 oz (69.9 kg)   SpO2 98%   BMI 24.90 kg/m   Wt Readings from Last 3 Encounters:  11/05/20 154 lb 3.2 oz (69.9 kg)  10/26/20 157 lb (71.2 kg)  08/02/20 162 lb (73.5 kg)    Physical Exam Vitals and nursing note reviewed.  Constitutional:      General: She is not in acute distress.    Appearance: Normal appearance. She is not ill-appearing, toxic-appearing or diaphoretic.  HENT:     Head: Normocephalic and atraumatic.     Right Ear: External ear normal.     Left Ear: External ear normal.     Nose: Nose normal.     Mouth/Throat:     Mouth: Mucous membranes are  moist.     Pharynx: Oropharynx is clear.  Eyes:     General: No scleral icterus.       Right eye: No discharge.        Left eye: No discharge.     Extraocular Movements: Extraocular movements intact.     Conjunctiva/sclera: Conjunctivae normal.     Pupils: Pupils are equal, round, and reactive to light.  Cardiovascular:     Rate and Rhythm: Normal rate and regular rhythm.     Pulses: Normal pulses.     Heart sounds: Normal heart sounds. No murmur heard.   No friction rub. No gallop.  Pulmonary:     Effort: Pulmonary effort is normal. No respiratory distress.     Breath sounds: Normal breath sounds. No stridor. No wheezing, rhonchi or rales.  Chest:     Chest wall: No tenderness.  Musculoskeletal:        General: Normal range of motion.     Cervical back: Normal range of motion and neck supple.  Skin:    General: Skin is warm and dry.     Capillary Refill: Capillary refill takes less than 2 seconds.     Coloration: Skin is not jaundiced or pale.     Findings: No bruising, erythema, lesion or rash.  Neurological:  General: No focal deficit present.     Mental Status: She is alert and oriented to person, place, and time. Mental status is at baseline.  Psychiatric:        Mood and Affect: Mood normal.        Behavior: Behavior normal.        Thought Content: Thought content normal.        Judgment: Judgment normal.    Results for orders placed or performed in visit on 08/02/20  Bayer DCA Hb A1c Waived  Result Value Ref Range   HB A1C (BAYER DCA - WAIVED) 8.7 (H) <7.0 %  Comprehensive metabolic panel  Result Value Ref Range   Glucose 149 (H) 65 - 99 mg/dL   BUN 27 8 - 27 mg/dL   Creatinine, Ser 1.11 (H) 0.57 - 1.00 mg/dL   eGFR 52 (L) >59 mL/min/1.73   BUN/Creatinine Ratio 24 12 - 28   Sodium 138 134 - 144 mmol/L   Potassium 4.1 3.5 - 5.2 mmol/L   Chloride 99 96 - 106 mmol/L   CO2 21 20 - 29 mmol/L   Calcium 8.8 8.7 - 10.3 mg/dL   Total Protein 6.6 6.0 - 8.5 g/dL    Albumin 4.3 3.7 - 4.7 g/dL   Globulin, Total 2.3 1.5 - 4.5 g/dL   Albumin/Globulin Ratio 1.9 1.2 - 2.2   Bilirubin Total 0.3 0.0 - 1.2 mg/dL   Alkaline Phosphatase 78 44 - 121 IU/L   AST 11 0 - 40 IU/L   ALT 8 0 - 32 IU/L  Lipid Panel w/o Chol/HDL Ratio  Result Value Ref Range   Cholesterol, Total 142 100 - 199 mg/dL   Triglycerides 140 0 - 149 mg/dL   HDL 51 >39 mg/dL   VLDL Cholesterol Cal 24 5 - 40 mg/dL   LDL Chol Calc (NIH) 67 0 - 99 mg/dL      Assessment & Plan:   Problem List Items Addressed This Visit       Endocrine   Diabetes (HCC) - Primary    Doing well with A1c of 7.0. Continue diet and exercise. Continue current regimen. Call with any concerns.       Relevant Medications   Dulaglutide (TRULICITY) 4.5 MG/0.5ML SOPN   Other Relevant Orders   Bayer DCA Hb A1c Waived     Follow up plan: Return in about 3 months (around 02/05/2021) for physical.       

## 2020-11-21 ENCOUNTER — Ambulatory Visit: Payer: Medicare Other

## 2020-12-03 ENCOUNTER — Ambulatory Visit (INDEPENDENT_AMBULATORY_CARE_PROVIDER_SITE_OTHER): Payer: Medicare Other

## 2020-12-03 VITALS — Ht 65.0 in | Wt 154.0 lb

## 2020-12-03 DIAGNOSIS — Z0000 Encounter for general adult medical examination without abnormal findings: Secondary | ICD-10-CM

## 2020-12-03 NOTE — Progress Notes (Signed)
I connected with [facility] Nurse today by telephone and verified that I am speaking with the correct person using two identifiers. Location patient: home Location provider: work Persons participating in the virtual visit: Melvinia Ashby, Glenna Durand LPN.   I discussed the limitations, risks, security and privacy concerns of performing an evaluation and management service by telephone and the availability of in person appointments. I also discussed with the patient that there may be a patient responsible charge related to this service. The patient expressed understanding and verbally consented to this telephonic visit.    Interactive audio and video telecommunications were attempted between this provider and patient, however failed, due to patient having technical difficulties OR patient did not have access to video capability.  We continued and completed visit with audio only.     Vital signs may be patient reported or missing.  Subjective:   Alison Hernandez is a 75 y.o. female who presents for Medicare Annual (Subsequent) preventive examination.  Review of Systems     Cardiac Risk Factors include: advanced age (>52mn, >>56women);diabetes mellitus;dyslipidemia;hypertension;sedentary lifestyle     Objective:    Today's Vitals   12/03/20 1346  Weight: 154 lb (69.9 kg)  Height: 5' 5" (1.651 m)   Body mass index is 25.63 kg/m.  Advanced Directives 12/03/2020 11/21/2019 11/06/2016 06/28/2016 03/26/2016  Does Patient Have a Medical Advance Directive? _0   Would patient like information on creating a medical advance directive? - - Yes (MAU/Ambulatory/Procedural Areas - Information given) Yes (Inpatient - patient requests chaplain consult to create a medical advance directive) -    Current Medications (verified) Outpatient Encounter Medications as of 12/03/2020  Medication Sig   aspirin EC 81 MG tablet Take 81 mg by mouth daily. Swallow whole.   Blood Glucose Monitoring Suppl  (ONE TOUCH ULTRA 2) w/Device KIT Test 3x a day   buPROPion (WELLBUTRIN XL) 300 MG 24 hr tablet Take 1 tablet (300 mg total) by mouth daily.   Dulaglutide (TRULICITY) 4.5 MLU/9.4PTSOPN Inject 4.5 mg into the skin.   glucose blood (ONETOUCH ULTRA) test strip CHECK GLUCOSE ONCE DAILY   glucose blood test strip Use as instructed   lisinopril-hydrochlorothiazide (ZESTORETIC) 20-25 MG tablet Take 1 tablet by mouth daily.   meclizine (ANTIVERT) 25 MG tablet Take 1 tablet (25 mg total) by mouth 3 (three) times daily as needed for dizziness.   metFORMIN (GLUCOPHAGE) 1000 MG tablet Take 1 tablet (1,000 mg total) by mouth 2 (two) times daily with a meal.   Multiple Vitamin (MULTIVITAMIN) tablet Take 1 tablet by mouth daily.   NYSTATIN powder APPLY  POWDER TOPICALLY 4 TIMES DAILY   omeprazole (PRILOSEC) 20 MG capsule Take 2 capsules (40 mg total) by mouth daily.   ondansetron (ZOFRAN-ODT) 8 MG disintegrating tablet DISSOLVE 1 TABLET IN MOUTH EVERY 8 HOURS AS NEEDED FOR NAUSEA OR VOMITING   OneTouch Delica Lancets 300FMISC Test 3x a day   rosuvastatin (CRESTOR) 10 MG tablet Take 1 tablet (10 mg total) by mouth daily.   No facility-administered encounter medications on file as of 12/03/2020.    Allergies (verified) Lovastatin, Bydureon [exenatide], and Losartan   History: Past Medical History:  Diagnosis Date   Diabetes mellitus without complication ([place]    Hyperlipidemia    Hypertension    Past Surgical History:  Procedure Laterality Date   ABDOMINAL HYSTERECTOMY     total   BREAST BIOPSY Left 20+ yrs ago   benign   BREAST BIOPSY Left  20+ yrs ago   benign   ROTATOR CUFF REPAIR Right 09/30/2016   SIGMOIDOSCOPY  1990   Family History  Problem Relation Age of Onset   Diabetes Mother    Heart disease Mother    Cancer Father        esophagus/ lung   Diabetes Maternal Grandmother    Cancer Sister        Ovarian   Social History   Socioeconomic History   Marital status: Married     Spouse name: Not on file   Number of children: Not on file   Years of education: Not on file   Highest education level: Not on file  Occupational History   Not on file  Tobacco Use   Smoking status: Never   Smokeless tobacco: Never  Vaping Use   Vaping Use: Never used  Substance and Sexual Activity   Alcohol use: No   Drug use: No   Sexual activity: Not Currently    Birth control/protection: None  Other Topics Concern   Not on file  Social History Narrative   Not on file   Social Determinants of Health   Financial Resource Strain: Low Risk    Difficulty of Paying Living Expenses: Not hard at all  Food Insecurity: No Food Insecurity   Worried About Charity fundraiser in the Last Year: Never true   [place] in the Last Year: Never true  Transportation Needs: No Transportation Needs   Lack of Transportation (Medical): No   Lack of Transportation (Non-Medical): No  Physical Activity: Inactive   Days of Exercise per Week: 0 days   Minutes of Exercise per Session: 0 min  Stress: No Stress Concern Present   Feeling of Stress : Not at all  Social Connections: Unknown   Frequency of Communication with Friends and Family: More than three times a week   Frequency of Social Gatherings with Friends and Family: More than three times a week   Attends Religious Services: Not on Electrical engineer or Organizations: Not on file   Attends Archivist Meetings: Not on file   Marital Status: Not on file    Tobacco Counseling Counseling given: Not Answered   Clinical Intake:  Pre-visit preparation completed: Yes  Pain : No/denies pain     Nutritional Status: BMI 25 -29 Overweight Nutritional Risks: Nausea/ vomitting/ diarrhea (diarrhea due to medicine) Diabetes: Yes  How often do you need to have someone help you when you read instructions, pamphlets, or other written materials from your doctor or pharmacy?: 1 - Never What is the last grade level  you completed in school?: 12th grade  Diabetic? Yes Nutrition Risk Assessment:  Has the patient had any N/V/D within the last 2 months?  Yes  Does the patient have any non-healing wounds?  No  Has the patient had any unintentional weight loss or weight gain?  No   Diabetes:  Is the patient diabetic?  Yes  If diabetic, was a CBG obtained today?  No  Did the patient bring in their glucometer from home?  No  How often do you monitor your CBG's? Twice daily   Financial Strains and Diabetes Management:  Are you having any financial strains with the device, your supplies or your medication? No .  Does the patient want to be seen by Chronic Care Management for management of their diabetes?  No  Would the patient like to be referred to a  Nutritionist or for Diabetic Management?  No   Diabetic Exams:  Diabetic Eye Exam: Overdue for diabetic eye exam. Pt has been advised about the importance in completing this exam. Patient advised to call and schedule an eye exam. Diabetic Foot Exam: Overdue, Pt has been advised about the importance in completing this exam. Pt is scheduled for diabetic foot exam on next appointment.   Interpreter Needed?: No  Information entered by :: NAllen LPN   Activities of Daily Living In your present state of health, do you have any difficulty performing the following activities: 12/03/2020  Hearing? N  Vision? N  Difficulty concentrating or making decisions? N  Walking or climbing stairs? N  Dressing or bathing? N  Doing errands, shopping? N  Preparing Food and eating ? N  Using the Toilet? N  In the past six months, have you accidently leaked urine? Y  Comment with coughing  Do you have problems with loss of bowel control? N  Managing your Medications? N  Managing your Finances? N  Housekeeping or managing your Housekeeping? N  Some recent data might be hidden    Patient Care Team: Valerie Roys, DO as PCP - General (Family Medicine) Tania Ade, MD as Consulting Physician (Orthopedic Surgery) Garrel Ridgel, [place] as Consulting Physician (Podiatry) Vanita Ingles, RN as Registered Nurse (General Practice)  Indicate any recent Medical Services you may have received from other than [facility] providers in the past year (date may be approximate).     Assessment:   This is a routine wellness examination for [place].  Hearing/Vision screen Vision Screening - Comments:: No regular eye exams  Dietary issues and exercise activities discussed: Current Exercise Habits: The patient does not participate in regular exercise at present   Goals Addressed             This Visit's Progress    Patient Stated       12/03/2020, no goals       Depression Screen PHQ 2/9 Scores 12/03/2020 10/26/2020 08/02/2020 04/19/2020 01/17/2020 11/21/2019 10/18/2019  PHQ - 2 Score 0 0 0 0 0 0 0  PHQ- 9 Score - - 0 0 0 - 0    Fall Risk Fall Risk  12/03/2020 01/17/2020 11/21/2019 11/18/2018 07/15/2017  Falls in the past year? 0 0 0 0 Yes  Number falls in past yr: - 0 - 0 1  Injury with Fall? - 0 - 0 Yes  Risk Factor Category  - - - - -  Comment - - - - -  Risk for fall due to : Medication side effect No Fall Risks Medication side effect - Impaired balance/gait  Follow up Falls evaluation completed;Education provided;Falls prevention discussed Falls evaluation completed Falls evaluation completed;Education provided;Falls prevention discussed - Falls evaluation completed;Falls prevention discussed;Education provided    FALL RISK PREVENTION PERTAINING TO THE HOME:  Any stairs in or around the home? Yes If so, are there any without handrails? Yes  Home free of loose throw rugs in walkways, pet beds, electrical cords, etc? Yes  Adequate lighting in your home to reduce risk of falls? Yes   ASSISTIVE DEVICES UTILIZED TO PREVENT FALLS:  Life alert? No  Use of a cane, walker or w/c? No  Grab bars in the bathroom? Yes  Shower chair or bench in shower? Yes   Elevated toilet seat or a handicapped toilet? Yes   TIMED UP AND GO:  Was the test performed? No .      Cognitive  Function:     6CIT Screen 12/03/2020 11/21/2019 11/18/2018 11/06/2016  What Year? 0 points 0 points 0 points 0 points  What month? 0 points 0 points 0 points 0 points  What time? 0 points 0 points 0 points 0 points  Count back from 20 0 points 0 points 0 points 0 points  Months in reverse 0 points 0 points 0 points 0 points  Repeat phrase 2 points 0 points 0 points 0 points  Total Score 2 0 0 0    Immunizations Immunization History  Administered Date(s) Administered   Fluad Quad(high Dose 65+) 11/18/2018, 01/17/2020   Influenza, High Dose Seasonal PF 11/28/2015, 04/08/2017, 04/07/2018   Influenza,inj,Quad PF,6+ Mos 03/28/2015   PFIZER(Purple Top)SARS-COV-2 Vaccination 06/02/2019, 06/28/2019   Pneumococcal Conjugate-13 08/29/2015   Pneumococcal Polysaccharide-23 03/24/1997, 11/06/2016   Td 05/12/2008, 11/18/2018    TDAP status: Up to date  Flu Vaccine status: Due, Education has been provided regarding the importance of this vaccine. Advised may receive this vaccine at local pharmacy or Health Dept. Aware to provide a copy of the vaccination record if obtained from local pharmacy or Health Dept. Verbalized acceptance and understanding.  Pneumococcal vaccine status: Up to date  Covid-19 vaccine status: Completed vaccines  Qualifies for Shingles Vaccine? Yes   Zostavax completed No   Shingrix Completed?: No.    Education has been provided regarding the importance of this vaccine. Patient has been advised to call insurance company to determine out of pocket expense if they have not yet received this vaccine. Advised may also receive vaccine at local pharmacy or Health Dept. Verbalized acceptance and understanding.  Screening Tests Health Maintenance  Topic Date Due   OPHTHALMOLOGY EXAM  Never done   COVID-19 Vaccine (3 - Booster for Pfizer series) 11/28/2019    FOOT EXAM  10/17/2020   INFLUENZA VACCINE  10/22/2020   Zoster Vaccines- Shingrix (1 of 2) 02/05/2021 (Originally 04/02/1997)   DEXA SCAN  11/05/2021 (Originally 04/02/2012)   COLONOSCOPY (Pts 45-60yrs Insurance coverage will need to be confirmed)  11/05/2021 (Originally 04/02/1992)   HEMOGLOBIN A1C  05/08/2021   MAMMOGRAM  02/20/2022   TETANUS/TDAP  11/17/2028   Hepatitis C Screening  Completed   PNA vac Low Risk Adult  Completed   HPV VACCINES  Aged Out    Health Maintenance  Health Maintenance Due  Topic Date Due   OPHTHALMOLOGY EXAM  Never done   COVID-19 Vaccine (3 - Booster for Pfizer series) 11/28/2019   FOOT EXAM  10/17/2020   INFLUENZA VACCINE  10/22/2020    Colorectal cancer screening: decline  Mammogram status: Completed 02/21/2020. Repeat every year  Bone Density status: decline  Lung Cancer Screening: (Low Dose CT Chest recommended if Age 49-80 years, 30 pack-year currently smoking OR have quit w/in 15years.) does not qualify.   Lung Cancer Screening Referral: no  Additional Screening:  Hepatitis C Screening: does qualify; Completed 03/28/2015  Vision Screening: Recommended annual ophthalmology exams for early detection of glaucoma and other disorders of the eye. Is the patient up to date with their annual eye exam?  No  Who is the provider or what is the name of the office in which the patient attends annual eye exams? none If pt is not established with a provider, would they like to be referred to a provider to establish care? No .   Dental Screening: Recommended annual dental exams for proper oral hygiene  Community Resource Referral / Chronic Care Management: CRR required this visit?  No  CCM required this visit?  No      Plan:     I have personally reviewed and noted the following in the patient's chart:   Medical and social history Use of alcohol, tobacco or illicit drugs  Current medications and supplements including opioid prescriptions.   Functional ability and status Nutritional status Physical activity Advanced directives List of other physicians Hospitalizations, surgeries, and ER visits in previous 12 months Vitals Screenings to include cognitive, depression, and falls Referrals and appointments  In addition, I have reviewed and discussed with patient certain preventive protocols, quality metrics, and best practice recommendations. A written personalized care plan for preventive services as well as general preventive health recommendations were provided to patient.     Kellie Simmering, LPN   [id number]   Nurse Notes:

## 2020-12-03 NOTE — Patient Instructions (Signed)
[name] , Thank you for taking time to come for your Medicare Wellness Visit. I appreciate your ongoing commitment to your health goals. Please review the following plan we discussed and let me know if I can assist you in the future.   Screening recommendations/referrals: Colonoscopy: decline Mammogram: completed 02/21/2020 Bone Density: decline Recommended yearly ophthalmology/optometry visit for glaucoma screening and checkup Recommended yearly dental visit for hygiene and checkup  Vaccinations: Influenza vaccine: due Pneumococcal vaccine: completed 11/06/2016 Tdap vaccine: completed 11/18/2018, due 11/17/2028 Shingles vaccine: discussed   Covid-19: 06/28/2019, 06/02/2019  Advanced directives: Advance directive discussed with you today. .  Conditions/risks identified: none  Next appointment: Follow up in one year for your annual wellness visit    Preventive Care 65 Years and Older, Female Preventive care refers to lifestyle choices and visits with your health care provider that can promote health and wellness. What does preventive care include? A yearly physical exam. This is also called an annual well check. Dental exams once or twice a year. Routine eye exams. Ask your health care provider how often you should have your eyes checked. Personal lifestyle choices, including: Daily care of your teeth and gums. Regular physical activity. Eating a healthy diet. Avoiding tobacco and drug use. Limiting alcohol use. Practicing safe sex. Taking low-dose aspirin every day. Taking vitamin and mineral supplements as recommended by your health care provider. What happens during an annual well check? The services and screenings done by your health care provider during your annual well check will depend on your age, overall health, lifestyle risk factors, and family history of disease. Counseling  Your health care provider may ask you questions about your: Alcohol use. Tobacco  use. Drug use. Emotional well-being. Home and relationship well-being. Sexual activity. Eating habits. History of falls. Memory and ability to understand (cognition). Work and work Astronomer. Reproductive health. Screening  You may have the following tests or measurements: Height, weight, and BMI. Blood pressure. Lipid and cholesterol levels. These may be checked every 5 years, or more frequently if you are over 48 years old. Skin check. Lung cancer screening. You may have this screening every year starting at age 29 if you have a 30-pack-year history of smoking and currently smoke or have quit within the past 15 years. Fecal occult blood test (FOBT) of the stool. You may have this test every year starting at age 66. Flexible sigmoidoscopy or colonoscopy. You may have a sigmoidoscopy every 5 years or a colonoscopy every 10 years starting at age 41. Hepatitis C blood test. Hepatitis B blood test. Sexually transmitted disease (STD) testing. Diabetes screening. This is done by checking your blood sugar (glucose) after you have not eaten for a while (fasting). You may have this done every 1-3 years. Bone density scan. This is done to screen for osteoporosis. You may have this done starting at age 41. Mammogram. This may be done every 1-2 years. Talk to your health care provider about how often you should have regular mammograms. Talk with your health care provider about your test results, treatment options, and if necessary, the need for more tests. Vaccines  Your health care provider may recommend certain vaccines, such as: Influenza vaccine. This is recommended every year. Tetanus, diphtheria, and acellular pertussis (Tdap, Td) vaccine. You may need a Td booster every 10 years. Zoster vaccine. You may need this after age 47. Pneumococcal 13-valent conjugate (PCV13) vaccine. One dose is recommended after age 73. Pneumococcal polysaccharide (PPSV23) vaccine. One dose is recommended  after  age 12. Talk to your health care provider about which screenings and vaccines you need and how often you need them. This information is not intended to replace advice given to you by your health care provider. Make sure you discuss any questions you have with your health care provider. Document Released: 04/06/2015 Document Revised: 11/28/2015 Document Reviewed: 01/09/2015 Elsevier Interactive Patient Education  2017 [place] Prevention in the Home Falls can cause injuries. They can happen to people of all ages. There are many things you can do to make your home safe and to help prevent falls. What can I do on the outside of my home? Regularly fix the edges of walkways and driveways and fix any cracks. Remove anything that might make you trip as you walk through a door, such as a raised step or threshold. Trim any bushes or trees on the path to your home. Use bright outdoor lighting. Clear any walking paths of anything that might make someone trip, such as rocks or tools. Regularly check to see if handrails are loose or broken. Make sure that both sides of any steps have handrails. Any raised decks and porches should have guardrails on the edges. Have any leaves, snow, or ice cleared regularly. Use sand or salt on walking paths during winter. Clean up any spills in your garage right away. This includes oil or grease spills. What can I do in the bathroom? Use night lights. Install grab bars by the toilet and in the tub and shower. Do not use towel bars as grab bars. Use non-skid mats or decals in the tub or shower. If you need to sit down in the shower, use a plastic, non-slip stool. Keep the floor dry. Clean up any water that spills on the floor as soon as it happens. Remove soap buildup in the tub or shower regularly. Attach bath mats securely with double-sided non-slip rug tape. Do not have throw rugs and other things on the floor that can make you trip. What can I do  in the bedroom? Use night lights. Make sure that you have a light by your bed that is easy to reach. Do not use any sheets or blankets that are too big for your bed. They should not hang down onto the floor. Have a firm chair that has side arms. You can use this for support while you get dressed. Do not have throw rugs and other things on the floor that can make you trip. What can I do in the kitchen? Clean up any spills right away. Avoid walking on wet floors. Keep items that you use a lot in easy-to-reach places. If you need to reach something above you, use a strong step stool that has a grab bar. Keep electrical cords out of the way. Do not use floor polish or wax that makes floors slippery. If you must use wax, use non-skid floor wax. Do not have throw rugs and other things on the floor that can make you trip. What can I do with my stairs? Do not leave any items on the stairs. Make sure that there are handrails on both sides of the stairs and use them. Fix handrails that are broken or loose. Make sure that handrails are as long as the stairways. Check any carpeting to make sure that it is firmly attached to the stairs. Fix any carpet that is loose or worn. Avoid having throw rugs at the top or bottom of the stairs. If you do have  throw rugs, attach them to the floor with carpet tape. Make sure that you have a light switch at the top of the stairs and the bottom of the stairs. If you do not have them, ask someone to add them for you. What else can I do to help prevent falls? Wear shoes that: Do not have high heels. Have rubber bottoms. Are comfortable and fit you well. Are closed at the toe. Do not wear sandals. If you use a stepladder: Make sure that it is fully opened. Do not climb a closed stepladder. Make sure that both sides of the stepladder are locked into place. Ask someone to hold it for you, if possible. Clearly mark and make sure that you can see: Any grab bars or  handrails. First and last steps. Where the edge of each step is. Use tools that help you move around (mobility aids) if they are needed. These include: Canes. Walkers. Scooters. Crutches. Turn on the lights when you go into a dark area. Replace any light bulbs as soon as they burn out. Set up your furniture so you have a clear path. Avoid moving your furniture around. If any of your floors are uneven, fix them. If there are any pets around you, be aware of where they are. Review your medicines with your doctor. Some medicines can make you feel dizzy. This can increase your chance of falling. Ask your doctor what other things that you can do to help prevent falls. This information is not intended to replace advice given to you by your health care provider. Make sure you discuss any questions you have with your health care provider. Document Released: 01/04/2009 Document Revised: 08/16/2015 Document Reviewed: 04/14/2014 Elsevier Interactive Patient Education  2017 [employer].

## 2020-12-17 ENCOUNTER — Ambulatory Visit (INDEPENDENT_AMBULATORY_CARE_PROVIDER_SITE_OTHER): Payer: Medicare Other

## 2020-12-17 ENCOUNTER — Other Ambulatory Visit: Payer: Self-pay

## 2020-12-17 DIAGNOSIS — Z23 Encounter for immunization: Secondary | ICD-10-CM

## 2021-01-11 ENCOUNTER — Telehealth: Payer: Self-pay

## 2021-01-11 NOTE — Telephone Encounter (Signed)
  Care Management   Follow Up Note   01/11/2021 Name: Alison Hernandez MRN: [id number] DOB: [birth year]   Referred by: Dorcas Carrow, DO Reason for referral : Chronic Care Management (RNCM: Follow up for Chronic Disease Management and Care Coordination Needs)   An unsuccessful telephone outreach was attempted today. The patient was referred to the case management team for assistance with care management and care coordination.   Follow Up Plan: A HIPPA compliant phone message was left for the patient providing contact information and requesting a return call.   Alto Denver RN, MSN, CCM Community Care Coordinator [place]  [facility] Mobile: [phone]

## 2021-01-31 ENCOUNTER — Telehealth: Payer: Self-pay

## 2021-01-31 NOTE — Chronic Care Management (AMB) (Signed)
    Chronic Care Management Pharmacy Assistant   Name: Alison Hernandez  MRN: [id number] DOB: 05-06-1945  Reason for Encounter: Medication coordination   I attempted to reach patient to inform her of her 2023 renewal forms.I left a voicemail stating that I mailed out patient assistance forms for [employer] and trulicity 2023 renewal. Forms are pre- filled and need patient's signature. I also attached instructions and my contact information if needed.     Salli Real, CMA

## 2021-02-04 ENCOUNTER — Telehealth: Payer: Self-pay

## 2021-02-04 NOTE — Chronic Care Management (AMB) (Signed)
  Care Management   Note  02/04/2021 Name: ARISA CONGLETON MRN: [id number] DOB: Nov 08, 1945  LYNSAY FESPERMAN is a 75 y.o. year old female who is a primary care patient of Dorcas Carrow, DO and is actively engaged with the care management team. I reached out to Caren Hazy by phone today to assist with re-scheduling a follow up visit with the RN Case Manager  Follow up plan: Unsuccessful telephone outreach attempt made.  The care management team will reach out to the patient again over the next 7 days.  If patient returns call to provider office, please advise to call Embedded Care Management Care Guide Penne Lash  at [phone]  Penne Lash, RMA Care Guide, Embedded Care Coordination [facility]  [place], [place] [address] Direct Dial: [phone] [email or web address] Website: [email or web address]

## 2021-02-05 ENCOUNTER — Telehealth: Payer: Self-pay | Admitting: Family Medicine

## 2021-02-05 ENCOUNTER — Encounter: Payer: Medicare Other | Admitting: Family Medicine

## 2021-02-05 NOTE — Telephone Encounter (Signed)
Copied from CRM [id number]. Topic: General - Other >> Feb 04, 2021 10:24 AM Glean Salen wrote: Reason for AYO:[id number] needs to reshedule AWV. Please call back

## 2021-02-27 NOTE — Chronic Care Management (AMB) (Signed)
  Care Management   Note  02/27/2021 Name: LITZY DICKER MRN: [id number] DOB: 1945-12-21  DELA SWEENY is a 75 y.o. year old female who is a primary care patient of Dorcas Carrow, DO and is actively engaged with the care management team. I reached out to Caren Hazy by phone today to assist with re-scheduling a follow up visit with the RN Case Manager  Follow up plan: Unsuccessful telephone outreach attempt made. A HIPAA compliant phone message was left for the patient providing contact information and requesting a return call.  The care management team will reach out to the patient again over the next 7 days.  If patient returns call to provider office, please advise to call Embedded Care Management Care Guide Penne Lash at [phone]  Penne Lash, RMA Care Guide, Embedded Care Coordination [facility]  [place], [place] [address] Direct Dial: [phone] [email or web address] Website: [email or web address]

## 2021-03-07 ENCOUNTER — Other Ambulatory Visit: Payer: Self-pay | Admitting: Family Medicine

## 2021-03-08 NOTE — Telephone Encounter (Signed)
Requested Prescriptions  Pending Prescriptions Disp Refills   metFORMIN (GLUCOPHAGE) 1000 MG tablet [Pharmacy Med Name: metFORMIN HCl 1000 MG Oral Tablet] 180 tablet 0    Sig: TAKE 1 TABLET BY MOUTH TWICE DAILY WITH A MEAL     Endocrinology:  Diabetes - Biguanides Failed - 03/07/2021  6:08 PM      Failed - Cr in normal range and within 360 days    Creatinine, Ser  Date Value Ref Range Status  08/02/2020 1.11 (H) 0.57 - 1.00 mg/dL Final         Failed - eGFR in normal range and within 360 days    GFR calc Af Amer  Date Value Ref Range Status  04/04/2020 48 (L) >59 mL/min/1.73 Final    Comment:    **In accordance with recommendations from the NKF-ASN Task force,**   [facility] is in the process of updating its eGFR calculation to the   2021 CKD-EPI creatinine equation that estimates kidney function   without a race variable.    GFR calc non Af Amer  Date Value Ref Range Status  04/04/2020 42 (L) >59 mL/min/1.73 Final   eGFR  Date Value Ref Range Status  08/02/2020 52 (L) >59 mL/min/1.73 Final         Passed - HBA1C is between 0 and 7.9 and within 180 days    Hemoglobin A1C  Date Value Ref Range Status  11/28/2015 7.5  Final   HB A1C (BAYER DCA - WAIVED)  Date Value Ref Range Status  11/05/2020 7.0 (H) <7.0 % Final    Comment:                                          Diabetic Adult            <7.0                                       Healthy Adult        4.3 - 5.7                                                           (DCCT/NGSP) American Diabetes Association's Summary of Glycemic Recommendations for Adults with Diabetes: Hemoglobin A1c <7.0%. More stringent glycemic goals (A1c <6.0%) may further reduce complications at the cost of increased risk of hypoglycemia.          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

## 2021-03-15 NOTE — Chronic Care Management (AMB) (Signed)
°  Care Management   Note  03/15/2021 Name: Alison Hernandez MRN: [id number] DOB: 04-May-1945  Alison Hernandez is a 75 y.o. year old female who is a primary care patient of Alison Carrow, DO and is actively engaged with the care management team. I reached out to Caren Hazy by phone today to assist with re-scheduling a follow up visit with the RN Case Manager  Follow up plan: Unable to make contact on outreach attempts x 3. PCP Alison Carrow, DO notified via routed documentation in medical record.   Penne Lash, RMA Care Guide, Embedded Care Coordination [facility]  [place], [place] [address] Direct Dial: [phone] [email or web address] Website: [email or web address]

## 2021-03-15 NOTE — Telephone Encounter (Signed)
3rd unsuccessful outreach  

## 2021-03-26 ENCOUNTER — Ambulatory Visit (INDEPENDENT_AMBULATORY_CARE_PROVIDER_SITE_OTHER): Payer: Medicare Other | Admitting: Family Medicine

## 2021-03-26 ENCOUNTER — Encounter: Payer: Self-pay | Admitting: Family Medicine

## 2021-03-26 ENCOUNTER — Other Ambulatory Visit: Payer: Self-pay

## 2021-03-26 VITALS — BP 108/62 | HR 86 | Temp 98.0°F | Ht 65.0 in | Wt 154.4 lb

## 2021-03-26 DIAGNOSIS — Z1211 Encounter for screening for malignant neoplasm of colon: Secondary | ICD-10-CM

## 2021-03-26 DIAGNOSIS — I129 Hypertensive chronic kidney disease with stage 1 through stage 4 chronic kidney disease, or unspecified chronic kidney disease: Secondary | ICD-10-CM

## 2021-03-26 DIAGNOSIS — Z1231 Encounter for screening mammogram for malignant neoplasm of breast: Secondary | ICD-10-CM | POA: Diagnosis not present

## 2021-03-26 DIAGNOSIS — F3341 Major depressive disorder, recurrent, in partial remission: Secondary | ICD-10-CM | POA: Diagnosis not present

## 2021-03-26 DIAGNOSIS — E782 Mixed hyperlipidemia: Secondary | ICD-10-CM | POA: Diagnosis not present

## 2021-03-26 DIAGNOSIS — I10 Essential (primary) hypertension: Secondary | ICD-10-CM

## 2021-03-26 DIAGNOSIS — E1122 Type 2 diabetes mellitus with diabetic chronic kidney disease: Secondary | ICD-10-CM | POA: Diagnosis not present

## 2021-03-26 DIAGNOSIS — Z13820 Encounter for screening for osteoporosis: Secondary | ICD-10-CM | POA: Diagnosis not present

## 2021-03-26 DIAGNOSIS — D692 Other nonthrombocytopenic purpura: Secondary | ICD-10-CM

## 2021-03-26 DIAGNOSIS — R8281 Pyuria: Secondary | ICD-10-CM | POA: Diagnosis not present

## 2021-03-26 DIAGNOSIS — N182 Chronic kidney disease, stage 2 (mild): Secondary | ICD-10-CM | POA: Diagnosis not present

## 2021-03-26 DIAGNOSIS — Z0000 Encounter for general adult medical examination without abnormal findings: Secondary | ICD-10-CM | POA: Diagnosis not present

## 2021-03-26 LAB — MICROSCOPIC EXAMINATION

## 2021-03-26 LAB — MICROALBUMIN, URINE WAIVED
Creatinine, Urine Waived: 200 mg/dL (ref 10–300)
Microalb, Ur Waived: 80 mg/L — ABNORMAL HIGH (ref 0–19)

## 2021-03-26 LAB — URINALYSIS, ROUTINE W REFLEX MICROSCOPIC
Bilirubin, UA: NEGATIVE
Glucose, UA: NEGATIVE
Nitrite, UA: NEGATIVE
Specific Gravity, UA: >1.03 — ABNORMAL HIGH (ref 1.005–1.030)
Urobilinogen, Ur: 0.2 mg/dL (ref 0.2–1.0)
pH, UA: 5 (ref 5.0–7.5)

## 2021-03-26 LAB — BAYER DCA HB A1C WAIVED: HB A1C (BAYER DCA - WAIVED): 7.4 % — ABNORMAL HIGH (ref 4.8–5.6)

## 2021-03-26 MED ORDER — METFORMIN HCL 1000 MG PO TABS
1000.0000 mg | ORAL_TABLET | Freq: Two times a day (BID) | ORAL | 1 refills | Status: DC
Start: 2021-03-26 — End: 2021-09-27

## 2021-03-26 MED ORDER — ROSUVASTATIN CALCIUM 10 MG PO TABS: 10.0000 mg | ORAL_TABLET | Freq: Every day | ORAL | 1 refills | Status: DC

## 2021-03-26 MED ORDER — OMEPRAZOLE 20 MG PO CPDR: 40.0000 mg | DELAYED_RELEASE_CAPSULE | Freq: Every day | ORAL | 1 refills | Status: DC

## 2021-03-26 MED ORDER — BUPROPION HCL ER (XL) 300 MG PO TB24: 300.0000 mg | ORAL_TABLET | Freq: Every day | ORAL | 1 refills | Status: DC

## 2021-03-26 MED ORDER — TRULICITY 4.5 MG/0.5ML ~~LOC~~ SOPN
4.5000 mg | PEN_INJECTOR | SUBCUTANEOUS | 1 refills | Status: AC
Start: 2021-03-26 — End: 2021-06-24

## 2021-03-26 MED ORDER — LISINOPRIL-HYDROCHLOROTHIAZIDE 20-25 MG PO TABS: 1.0000 | ORAL_TABLET | Freq: Every day | ORAL | 1 refills | Status: DC

## 2021-03-26 NOTE — Assessment & Plan Note (Signed)
Up slightly with A1c of 7.4. Will work on diet and recheck 3 months. Continue current regimen. Continue to monitor.

## 2021-03-26 NOTE — Assessment & Plan Note (Signed)
Under good control on current regimen. Continue current regimen. Continue to monitor. Call with any concerns. Refills given. Labs drawn today.   

## 2021-03-26 NOTE — Patient Instructions (Addendum)
Please call to schedule your mammogram and bone density: [facility]  Address: [address] [place], [place] [address]  Phone: [phone]  Wenda Low Referral sent to [facility]  Phone: [phone]

## 2021-03-26 NOTE — Assessment & Plan Note (Signed)
Reassured patient. Continue to monitor. Call with any concerns.  

## 2021-03-26 NOTE — Addendum Note (Signed)
Addended by: Dorcas Carrow on: 03/26/2021 02:41 PM   Modules accepted: Orders

## 2021-03-26 NOTE — Progress Notes (Signed)
BP 108/62    Pulse 86    Temp 98 F (36.7 C)    Ht _0  (1.651 m)    Wt 154 lb 6.4 oz (70 kg)    SpO2 97%    BMI 25.69 kg/m    Subjective:    Patient ID: Alison Hernandez, female    DOB: June 24, 1945, 76 y.o.   MRN: [id number]  HPI: Alison Hernandez is a 76 y.o. female presenting on 03/26/2021 for comprehensive medical examination. Current medical complaints include:  DIABETES Hypoglycemic episodes:no Polydipsia/polyuria: no Visual disturbance: no Chest pain: no Paresthesias: no Glucose Monitoring: no  Accucheck frequency: Not Checking Taking Insulin?: no Blood Pressure Monitoring: not checking Retinal Examination: Not up to Date Foot Exam:  Done today Diabetic Education: Completed Pneumovax: Up to Date Influenza: Up to Date Aspirin: no  HYPERTENSION / HYPERLIPIDEMIA Satisfied with current treatment? yes Duration of hypertension: chronic BP monitoring frequency: not checking BP medication side effects: no Past BP meds: lisinopril-HCTZ Duration of hyperlipidemia: chronic Cholesterol medication side effects: no Cholesterol supplements: none Past cholesterol medications: crestor Medication compliance: excellent compliance Aspirin: no Recent stressors: no Recurrent headaches: no Visual changes: no Palpitations: no Dyspnea: no Chest pain: no Lower extremity edema: no Dizzy/lightheaded: no  DEPRESSION Mood status: controlled Satisfied with current treatment?: yes Symptom severity: mild  Duration of current treatment : chronic Side effects: no Medication compliance: excellent compliance Psychotherapy/counseling: no  Previous psychiatric medications: wellbutrin Depressed mood: no Anxious mood: no Anhedonia: no Significant weight loss or gain: no Insomnia: no  Fatigue: no Feelings of worthlessness or guilt: no Impaired concentration/indecisiveness: no Suicidal ideations: no Hopelessness: no Crying spells: no Depression screen [facility] 2/9 03/26/2021 12/03/2020  10/26/2020 08/02/2020 04/19/2020  Decreased Interest 0 0 0 0 0  Down, Depressed, Hopeless 0 0 0 0 0  PHQ - 2 Score 0 0 0 0 0  Altered sleeping 0 - - 0 0  Tired, decreased energy 0 - - 0 0  Change in appetite 0 - - 0 0  Feeling bad or failure about yourself  0 - - 0 0  Trouble concentrating 0 - - 0 0  Moving slowly or fidgety/restless 0 - - 0 0  Suicidal thoughts 0 - - 0 0  PHQ-9 Score 0 - - 0 0  Difficult doing work/chores - - - - -  Some recent data might be hidden    She currently lives with: husband, daughter and grandson Menopausal Symptoms: no  Depression Screen done today and results listed below:  Depression screen [facility] 2/9 03/26/2021 12/03/2020 10/26/2020 08/02/2020 04/19/2020  Decreased Interest 0 0 0 0 0  Down, Depressed, Hopeless 0 0 0 0 0  PHQ - 2 Score 0 0 0 0 0  Altered sleeping 0 - - 0 0  Tired, decreased energy 0 - - 0 0  Change in appetite 0 - - 0 0  Feeling bad or failure about yourself  0 - - 0 0  Trouble concentrating 0 - - 0 0  Moving slowly or fidgety/restless 0 - - 0 0  Suicidal thoughts 0 - - 0 0  PHQ-9 Score 0 - - 0 0  Difficult doing work/chores - - - - -  Some recent data might be hidden    Past Medical History:  Past Medical History:  Diagnosis Date   Diabetes mellitus without complication ([place])    Hyperlipidemia    Hypertension     Surgical History:  Past Surgical History:  Procedure  Laterality Date   BREAST BIOPSY Left 20+ yrs ago   benign   BREAST BIOPSY Left 20+ yrs ago   benign   ROTATOR CUFF REPAIR Right 09/30/2016   SIGMOIDOSCOPY  1990   TOTAL ABDOMINAL HYSTERECTOMY     total    Medications:  Current Outpatient Medications on File Prior to Visit  Medication Sig   aspirin EC 81 MG tablet Take 81 mg by mouth daily. Swallow whole.   Blood Glucose Monitoring Suppl (ONE TOUCH ULTRA 2) w/Device KIT Test 3x a day   glucose blood (ONETOUCH ULTRA) test strip CHECK GLUCOSE ONCE DAILY   glucose blood test strip Use as instructed   meclizine  (ANTIVERT) 25 MG tablet Take 1 tablet (25 mg total) by mouth 3 (three) times daily as needed for dizziness.   Multiple Vitamin (MULTIVITAMIN) tablet Take 1 tablet by mouth daily.   NYSTATIN powder APPLY  POWDER TOPICALLY 4 TIMES DAILY   ondansetron (ZOFRAN-ODT) 8 MG disintegrating tablet DISSOLVE 1 TABLET IN MOUTH EVERY 8 HOURS AS NEEDED FOR NAUSEA OR VOMITING   OneTouch Delica Lancets 78G MISC Test 3x a day   No current facility-administered medications on file prior to visit.    Allergies:  Allergies  Allergen Reactions   Lovastatin Nausea And Vomiting   Bydureon [Exenatide] Rash   Losartan Rash    Social History:  Social History   Socioeconomic History   Marital status: Married    Spouse name: Not on file   Number of children: Not on file   Years of education: Not on file   Highest education level: Not on file  Occupational History   Not on file  Tobacco Use   Smoking status: Never   Smokeless tobacco: Never  Vaping Use   Vaping Use: Never used  Substance and Sexual Activity   Alcohol use: No   Drug use: No   Sexual activity: Not Currently    Birth control/protection: None  Other Topics Concern   Not on file  Social History Narrative   Not on file   Social Determinants of Health   Financial Resource Strain: Low Risk    Difficulty of Paying Living Expenses: Not hard at all  Food Insecurity: No Food Insecurity   Worried About Charity fundraiser in the Last Year: Never true   Ran Out of Food in the Last Year: Never true  Transportation Needs: No Transportation Needs   Lack of Transportation (Medical): No   Lack of Transportation (Non-Medical): No  Physical Activity: Inactive   Days of Exercise per Week: 0 days   Minutes of Exercise per Session: 0 min  Stress: No Stress Concern Present   Feeling of Stress : Not at all  Social Connections: Unknown   Frequency of Communication with Friends and Family: More than three times a week   Frequency of Social  Gatherings with Friends and Family: More than three times a week   Attends Religious Services: Not on Electrical engineer or Organizations: Not on file   Attends Archivist Meetings: Not on file   Marital Status: Not on file  Intimate Partner Violence: Not At Risk   Fear of Current or Ex-Partner: No   Emotionally Abused: No   Physically Abused: No   Sexually Abused: No   Social History   Tobacco Use  Smoking Status Never  Smokeless Tobacco Never   Social History   Substance and Sexual Activity  Alcohol Use No  Family History:  Family History  Problem Relation Age of Onset   Diabetes Mother    Heart disease Mother    Cancer Father        esophagus/ lung   Diabetes Maternal Grandmother    Cancer Sister        Ovarian    Past medical history, surgical history, medications, allergies, family history and social history reviewed with patient today and changes made to appropriate areas of the chart.   Review of Systems  Constitutional: Negative.   HENT: Negative.    Eyes: Negative.   Respiratory:  Positive for cough. Negative for hemoptysis, sputum production, shortness of breath and wheezing.   Cardiovascular: Negative.   Gastrointestinal:  Positive for nausea and vomiting. Negative for abdominal pain, blood in stool, constipation, diarrhea, heartburn and melena.  Genitourinary: Negative.   Musculoskeletal: Negative.   Skin: Negative.   Neurological: Negative.   Endo/Heme/Allergies:  Negative for environmental allergies and polydipsia. Bruises/bleeds easily.  Psychiatric/Behavioral: Negative.    All other ROS negative except what is listed above and in the HPI.      Objective:    BP 108/62    Pulse 86    Temp 98 F (36.7 C)    Ht _0  (1.651 m)    Wt 154 lb 6.4 oz (70 kg)    SpO2 97%    BMI 25.69 kg/m   Wt Readings from Last 3 Encounters:  03/26/21 154 lb 6.4 oz (70 kg)  12/03/20 154 lb (69.9 kg)  11/05/20 154 lb 3.2 oz (69.9 kg)     Physical Exam Vitals and nursing note reviewed.  Constitutional:      General: She is not in acute distress.    Appearance: Normal appearance. She is normal weight. She is not ill-appearing, toxic-appearing or diaphoretic.  HENT:     Head: Normocephalic and atraumatic.     Right Ear: Tympanic membrane, ear canal and external ear normal. There is no impacted cerumen.     Left Ear: Tympanic membrane, ear canal and external ear normal. There is no impacted cerumen.     Nose: Nose normal. No congestion or rhinorrhea.     Mouth/Throat:     Mouth: Mucous membranes are moist.     Pharynx: Oropharynx is clear. No oropharyngeal exudate or posterior oropharyngeal erythema.  Eyes:     General: No scleral icterus.       Right eye: No discharge.        Left eye: No discharge.     Extraocular Movements: Extraocular movements intact.     Conjunctiva/sclera: Conjunctivae normal.     Pupils: Pupils are equal, round, and reactive to light.  Neck:     Vascular: No carotid bruit.  Cardiovascular:     Rate and Rhythm: Normal rate and regular rhythm.     Pulses: Normal pulses.     Heart sounds: No murmur heard.   No friction rub. No gallop.  Pulmonary:     Effort: Pulmonary effort is normal. No respiratory distress.     Breath sounds: Normal breath sounds. No stridor. No wheezing, rhonchi or rales.  Chest:     Chest wall: No tenderness.  Abdominal:     General: Abdomen is flat. Bowel sounds are normal. There is no distension.     Palpations: Abdomen is soft. There is no mass.     Tenderness: There is no abdominal tenderness. There is no right CVA tenderness, left CVA tenderness, guarding or rebound.  Hernia: No hernia is present.  Genitourinary:    Comments: Breast and pelvic exams deferred with shared decision making Musculoskeletal:        General: No swelling, tenderness, deformity or signs of injury.     Cervical back: Normal range of motion and neck supple. No rigidity. No muscular  tenderness.     Right lower leg: No edema.     Left lower leg: No edema.  Lymphadenopathy:     Cervical: No cervical adenopathy.  Skin:    General: Skin is warm and dry.     Capillary Refill: Capillary refill takes less than 2 seconds.     Coloration: Skin is not jaundiced or pale.     Findings: No bruising, erythema, lesion or rash.  Neurological:     General: No focal deficit present.     Mental Status: She is alert and oriented to person, place, and time. Mental status is at baseline.     Cranial Nerves: No cranial nerve deficit.     Sensory: No sensory deficit.     Motor: No weakness.     Coordination: Coordination normal.     Gait: Gait normal.     Deep Tendon Reflexes: Reflexes normal.  Psychiatric:        Mood and Affect: Mood normal.        Behavior: Behavior normal.        Thought Content: Thought content normal.        Judgment: Judgment normal.    Results for orders placed or performed in visit on 11/05/20  Bayer DCA Hb A1c Waived  Result Value Ref Range   HB A1C (BAYER DCA - WAIVED) 7.0 (H) <7.0 %      Assessment & Plan:   Problem List Items Addressed This Visit       Cardiovascular and Mediastinum   Senile purpura ([employer])    Reassured patient. Continue to monitor. Call with any concerns.       Relevant Medications   lisinopril-hydrochlorothiazide (ZESTORETIC) 20-25 MG tablet   rosuvastatin (CRESTOR) 10 MG tablet     Endocrine   Diabetes ([place])    Up slightly with A1c of 7.4. Will work on diet and recheck 3 months. Continue current regimen. Continue to monitor.       Relevant Medications   Dulaglutide (TRULICITY) 4.5 FW/2.6VZ SOPN   lisinopril-hydrochlorothiazide (ZESTORETIC) 20-25 MG tablet   metFORMIN (GLUCOPHAGE) 1000 MG tablet   rosuvastatin (CRESTOR) 10 MG tablet   Other Relevant Orders   CBC with Differential/Platelet   Comprehensive metabolic panel   Bayer DCA Hb A1c Waived   Microalbumin, Urine Waived   Ambulatory referral to Ophthalmology      Genitourinary   Benign hypertensive renal disease    Under good control on current regimen. Continue current regimen. Continue to monitor. Call with any concerns. Refills given. Labs drawn today.         Other   Hyperlipidemia    Under good control on current regimen. Continue current regimen. Continue to monitor. Call with any concerns. Refills given. Labs drawn today.      Relevant Medications   lisinopril-hydrochlorothiazide (ZESTORETIC) 20-25 MG tablet   rosuvastatin (CRESTOR) 10 MG tablet   Other Relevant Orders   CBC with Differential/Platelet   Comprehensive metabolic panel   Lipid Panel w/o Chol/HDL Ratio   Depression    Under good control on current regimen. Continue current regimen. Continue to monitor. Call with any concerns. Refills given. Labs drawn today.  Relevant Medications   buPROPion (WELLBUTRIN XL) 300 MG 24 hr tablet   Other Relevant Orders   CBC with Differential/Platelet   Comprehensive metabolic panel   Other Visit Diagnoses     Routine general medical examination at a health care facility    -  Primary   Vaccines up to date. Screening labs checked today. Mammo, DEXA and colonoscopy ordered today. Continue diet and exercise. Call with any concerns.    Encounter for screening mammogram for malignant neoplasm of breast       Mammogram ordered today. Await results.    Relevant Orders   MM DIAG BREAST TOMO BILATERAL   Screening for osteoporosis       DEXA ordered today. Await results.    Relevant Orders   DG Bone Density   Screening for colon cancer       Cologuard ordered today. Await results.    Relevant Orders   Cologuard   Essential hypertension       Relevant Medications   lisinopril-hydrochlorothiazide (ZESTORETIC) 20-25 MG tablet   rosuvastatin (CRESTOR) 10 MG tablet   Other Relevant Orders   CBC with Differential/Platelet   Comprehensive metabolic panel   Urinalysis, Routine w reflex microscopic   TSH   Microalbumin, Urine  Waived        Follow up plan: Return in about 3 months (around 06/24/2021).   LABORATORY TESTING:  - Pap smear: not applicable  IMMUNIZATIONS:   - Tdap: Tetanus vaccination status reviewed: last tetanus booster within 10 years. - Influenza: Up to date - Pneumovax: Up to date - Prevnar: Up to date - COVID: Up to date - HPV: Not applicable - Shingrix vaccine: Not applicable  SCREENING: -Mammogram: Ordered today  - Colonoscopy: Ordered today  - Bone Density: Ordered today   PATIENT COUNSELING:   Advised to take 1 mg of folate supplement per day if capable of pregnancy.   Sexuality: Discussed sexually transmitted diseases, partner selection, use of condoms, avoidance of unintended pregnancy  and contraceptive alternatives.   Advised to avoid cigarette smoking.  I discussed with the patient that most people either abstain from alcohol or drink within safe limits (<=14/week and <=4 drinks/occasion for males, <=7/weeks and <= 3 drinks/occasion for females) and that the risk for alcohol disorders and other health effects rises proportionally with the number of drinks per week and how often a drinker exceeds daily limits.  Discussed cessation/primary prevention of drug use and availability of treatment for abuse.   Diet: Encouraged to adjust caloric intake to maintain  or achieve ideal body weight, to reduce intake of dietary saturated fat and total fat, to limit sodium intake by avoiding high sodium foods and not adding table salt, and to maintain adequate dietary potassium and calcium preferably from fresh fruits, vegetables, and low-fat dairy products.    stressed the importance of regular exercise  Injury prevention: Discussed safety belts, safety helmets, smoke detector, smoking near bedding or upholstery.   Dental health: Discussed importance of regular tooth brushing, flossing, and dental visits.    NEXT PREVENTATIVE PHYSICAL DUE IN 1 YEAR. Return in about 3 months (around  06/24/2021).

## 2021-03-27 LAB — COMPREHENSIVE METABOLIC PANEL
ALT: 14 IU/L (ref 0–32)
AST: 16 IU/L (ref 0–40)
Albumin/Globulin Ratio: 2 (ref 1.2–2.2)
Albumin: 4.3 g/dL (ref 3.7–4.7)
Alkaline Phosphatase: 72 IU/L (ref 44–121)
BUN/Creatinine Ratio: 20 (ref 12–28)
BUN: 24 mg/dL (ref 8–27)
Bilirubin Total: 0.4 mg/dL (ref 0.0–1.2)
CO2: 22 mmol/L (ref 20–29)
Calcium: 9.1 mg/dL (ref 8.7–10.3)
Chloride: 101 mmol/L (ref 96–106)
Creatinine, Ser: 1.2 mg/dL — ABNORMAL HIGH (ref 0.57–1.00)
Globulin, Total: 2.1 g/dL (ref 1.5–4.5)
Glucose: 131 mg/dL — ABNORMAL HIGH (ref 70–99)
Potassium: 4.4 mmol/L (ref 3.5–5.2)
Sodium: 138 mmol/L (ref 134–144)
Total Protein: 6.4 g/dL (ref 6.0–8.5)
eGFR: 48 mL/min/{1.73_m2} — ABNORMAL LOW (ref >59)

## 2021-03-27 LAB — CBC WITH DIFFERENTIAL/PLATELET
Basophils Absolute: 0 10*3/uL (ref 0.0–0.2)
Basos: 0 %
EOS (ABSOLUTE): 0.1 10*3/uL (ref 0.0–0.4)
Eos: 1 %
Hematocrit: 30.2 % — ABNORMAL LOW (ref 34.0–46.6)
Hemoglobin: 10 g/dL — ABNORMAL LOW (ref 11.1–15.9)
Immature Grans (Abs): 0 10*3/uL (ref 0.0–0.1)
Immature Granulocytes: 0 %
Lymphocytes Absolute: 1.6 10*3/uL (ref 0.7–3.1)
Lymphs: 25 %
MCH: 29.4 pg (ref 26.6–33.0)
MCHC: 33.1 g/dL (ref 31.5–35.7)
MCV: 89 fL (ref 79–97)
Monocytes Absolute: 0.4 10*3/uL (ref 0.1–0.9)
Monocytes: 6 %
Neutrophils Absolute: 4.5 10*3/uL (ref 1.4–7.0)
Neutrophils: 68 %
Platelets: 233 10*3/uL (ref 150–450)
RBC: 3.4 x10E6/uL — ABNORMAL LOW (ref 3.77–5.28)
RDW: 12.3 % (ref 11.7–15.4)
WBC: 6.5 10*3/uL (ref 3.4–10.8)

## 2021-03-27 LAB — LIPID PANEL W/O CHOL/HDL RATIO
Cholesterol, Total: 155 mg/dL (ref 100–199)
HDL: 58 mg/dL (ref >39)
LDL Chol Calc (NIH): 76 mg/dL (ref 0–99)
Triglycerides: 121 mg/dL (ref 0–149)
VLDL Cholesterol Cal: 21 mg/dL (ref 5–40)

## 2021-03-27 LAB — TSH: TSH: 3.31 u[IU]/mL (ref 0.450–4.500)

## 2021-03-28 LAB — URINE CULTURE

## 2021-04-08 ENCOUNTER — Telehealth: Payer: Self-pay

## 2021-04-08 NOTE — Chronic Care Management (AMB) (Signed)
° ° °  Chronic Care Management °Pharmacy Assistant  ° °Name: Alison Hernandez  MRN: [id number] DOB: 09/22/1945 ° °Reason for Encounter: Disease State General ° ° °Recent office visits:  °03/26/21-Megan P. Johnson, DO (PCP) Seen for an annual exam visit. Labs ordered. Mammogram ordered. Bone density ordered and cologuard. Follow up in 3 months. Follow up in 3 months. °11/05/20-Megan P. Johnson, DO (PCP) Seen for hypertension ans diabetes. Follow up in 3 months. ° °Recent consult visits:  °None noted ° °Hospital visits:  °None in previous 6 months ° °Medications: °Outpatient Encounter Medications as of 04/08/2021  °Medication Sig  ° aspirin EC 81 MG tablet Take 81 mg by mouth daily. Swallow whole.  ° Blood Glucose Monitoring Suppl (ONE TOUCH ULTRA 2) w/Device KIT Test 3x a day  ° buPROPion (WELLBUTRIN XL) 300 MG 24 hr tablet Take 1 tablet (300 mg total) by mouth daily.  ° Dulaglutide (TRULICITY) 4.5 MG/0.5ML SOPN Inject 4.5 mg into the skin once a week.  ° glucose blood (ONETOUCH ULTRA) test strip CHECK GLUCOSE ONCE DAILY  ° glucose blood test strip Use as instructed  ° lisinopril-hydrochlorothiazide (ZESTORETIC) 20-25 MG tablet Take 1 tablet by mouth daily.  ° meclizine (ANTIVERT) 25 MG tablet Take 1 tablet (25 mg total) by mouth 3 (three) times daily as needed for dizziness.  ° metFORMIN (GLUCOPHAGE) 1000 MG tablet Take 1 tablet (1,000 mg total) by mouth 2 (two) times daily with a meal.  ° Multiple Vitamin (MULTIVITAMIN) tablet Take 1 tablet by mouth daily.  ° NYSTATIN powder APPLY  POWDER TOPICALLY 4 TIMES DAILY  ° omeprazole (PRILOSEC) 20 MG capsule Take 2 capsules (40 mg total) by mouth daily.  ° ondansetron (ZOFRAN-ODT) 8 MG disintegrating tablet DISSOLVE 1 TABLET IN MOUTH EVERY 8 HOURS AS NEEDED FOR NAUSEA OR VOMITING  ° OneTouch Delica Lancets 33G MISC Test 3x a day  ° rosuvastatin (CRESTOR) 10 MG tablet Take 1 tablet (10 mg total) by mouth daily.  ° °No facility-administered encounter medications on file as of  04/08/2021.  ° °Unsuccessful attempt to complete assessment call. I have attempted 3x and left 3 voicemail's for pt to return phone call. ° ° °Care Gaps: °Zoster Vaccines- Shingrix:Never done °COVID-19 Vaccine:Last completed: Jun 28, 2019 °OPHTHALMOLOGY EXAM:Last ordered: January 3rd 2023 ° °Star Rating Drugs: °Trulcity 4.5 mg/0.5 ml Last filled:06/20/20 28 DS °Lisinopril-hydrochlorothiazide 20-25 mg Last filled:12/27/20 90 DS °Rosuvastatin 10 mg Last filled:01/07/21 90 DS °  °Myriam Estrada, RMA °Health Concierge °

## 2021-04-11 DIAGNOSIS — E119 Type 2 diabetes mellitus without complications: Secondary | ICD-10-CM | POA: Diagnosis not present

## 2021-04-11 LAB — HM DIABETES EYE EXAM

## 2021-04-18 DIAGNOSIS — H25011 Cortical age-related cataract, right eye: Secondary | ICD-10-CM | POA: Diagnosis not present

## 2021-04-19 ENCOUNTER — Other Ambulatory Visit: Payer: Self-pay | Admitting: Family Medicine

## 2021-04-19 DIAGNOSIS — Z1231 Encounter for screening mammogram for malignant neoplasm of breast: Secondary | ICD-10-CM

## 2021-04-19 DIAGNOSIS — R921 Mammographic calcification found on diagnostic imaging of breast: Secondary | ICD-10-CM

## 2021-04-23 ENCOUNTER — Encounter: Payer: Self-pay | Admitting: *Deleted

## 2021-05-02 NOTE — Discharge Instructions (Signed)

## 2021-05-07 ENCOUNTER — Other Ambulatory Visit: Payer: Self-pay

## 2021-05-07 ENCOUNTER — Ambulatory Visit: Payer: Medicare Other | Admitting: Anesthesiology

## 2021-05-07 ENCOUNTER — Ambulatory Visit
Admission: RE | Admit: 2021-05-07 | Discharge: 2021-05-07 | Disposition: A | Discharge status: Home / Self Care | Payer: Medicare Other | Attending: Ophthalmology | Admitting: Ophthalmology

## 2021-05-07 ENCOUNTER — Encounter
Admission: RE | Disposition: A | Discharge status: Home / Self Care | Payer: Self-pay | Source: Home / Self Care | Attending: Ophthalmology

## 2021-05-07 ENCOUNTER — Encounter: Payer: Self-pay | Admitting: Ophthalmology

## 2021-05-07 DIAGNOSIS — Z8673 Personal history of transient ischemic attack (TIA), and cerebral infarction without residual deficits: Secondary | ICD-10-CM | POA: Diagnosis not present

## 2021-05-07 DIAGNOSIS — H2511 Age-related nuclear cataract, right eye: Secondary | ICD-10-CM | POA: Diagnosis not present

## 2021-05-07 DIAGNOSIS — I10 Essential (primary) hypertension: Secondary | ICD-10-CM | POA: Diagnosis not present

## 2021-05-07 DIAGNOSIS — F32A Depression, unspecified: Secondary | ICD-10-CM | POA: Insufficient documentation

## 2021-05-07 DIAGNOSIS — H25811 Combined forms of age-related cataract, right eye: Secondary | ICD-10-CM | POA: Insufficient documentation

## 2021-05-07 DIAGNOSIS — K219 Gastro-esophageal reflux disease without esophagitis: Secondary | ICD-10-CM | POA: Diagnosis not present

## 2021-05-07 DIAGNOSIS — Z7985 Long-term (current) use of injectable non-insulin antidiabetic drugs: Secondary | ICD-10-CM | POA: Insufficient documentation

## 2021-05-07 DIAGNOSIS — M1990 Unspecified osteoarthritis, unspecified site: Secondary | ICD-10-CM | POA: Insufficient documentation

## 2021-05-07 DIAGNOSIS — Z7984 Long term (current) use of oral hypoglycemic drugs: Secondary | ICD-10-CM | POA: Insufficient documentation

## 2021-05-07 DIAGNOSIS — E1136 Type 2 diabetes mellitus with diabetic cataract: Secondary | ICD-10-CM | POA: Diagnosis not present

## 2021-05-07 HISTORY — DX: Dizziness and giddiness: R42

## 2021-05-07 HISTORY — PX: CATARACT EXTRACTION W/PHACO: SHX586

## 2021-05-07 HISTORY — DX: Unspecified osteoarthritis, unspecified site: M19.90

## 2021-05-07 HISTORY — DX: Transient cerebral ischemic attack, unspecified: G45.9

## 2021-05-07 HISTORY — DX: Gastro-esophageal reflux disease without esophagitis: K21.9

## 2021-05-07 LAB — GLUCOSE, CAPILLARY: Glucose-Capillary: 126 mg/dL — ABNORMAL HIGH (ref 70–99)

## 2021-05-07 SURGERY — PHACOEMULSIFICATION, CATARACT, WITH IOL INSERTION
Anesthesia: Monitor Anesthesia Care | Site: Eye | Laterality: Right

## 2021-05-07 MED ORDER — TETRACAINE 0.5 % OP SOLN OPTIME - NO CHARGE
OPHTHALMIC | Status: DC | PRN
  Administered 2021-05-07: 1 [drp] via OPHTHALMIC

## 2021-05-07 MED ORDER — LACTATED RINGERS IV SOLN: INTRAVENOUS | Status: DC

## 2021-05-07 MED ORDER — FENTANYL CITRATE (PF) 100 MCG/2ML IJ SOLN
INTRAMUSCULAR | Status: DC | PRN
  Administered 2021-05-07: 50 ug via INTRAVENOUS

## 2021-05-07 MED ORDER — MOXIFLOXACIN HCL 0.5 % OP SOLN
OPHTHALMIC | Status: DC | PRN
  Administered 2021-05-07: 0.2 mL via OPHTHALMIC

## 2021-05-07 MED ORDER — BRIMONIDINE TARTRATE-TIMOLOL 0.2-0.5 % OP SOLN
OPHTHALMIC | Status: DC | PRN
  Administered 2021-05-07: 1 [drp] via OPHTHALMIC

## 2021-05-07 MED ORDER — LIDOCAINE HCL (PF) 2 % IJ SOLN
INTRAOCULAR | Status: DC | PRN
  Administered 2021-05-07: 1 mL via INTRAOCULAR

## 2021-05-07 MED ORDER — SIGHTPATH DOSE#1 BSS IO SOLN
INTRAOCULAR | Status: DC | PRN
  Administered 2021-05-07: 136 mL via OPHTHALMIC

## 2021-05-07 MED ORDER — MIDAZOLAM HCL 2 MG/2ML IJ SOLN
INTRAMUSCULAR | Status: DC | PRN
  Administered 2021-05-07: 1 mg via INTRAVENOUS

## 2021-05-07 MED ORDER — BSS IO SOLN
INTRAOCULAR | Status: DC | PRN
  Administered 2021-05-07: 15 mL

## 2021-05-07 MED ORDER — SIGHTPATH DOSE#1 BSS IO SOLN
INTRAOCULAR | Status: DC | PRN
  Administered 2021-05-07: 15 mL

## 2021-05-07 MED ORDER — ARMC OPHTHALMIC DILATING DROPS
1.0000 "application " | OPHTHALMIC | Status: DC | PRN
  Administered 2021-05-07 (×3): 1 via OPHTHALMIC

## 2021-05-07 MED ORDER — SIGHTPATH DOSE#1 NA HYALUR & NA CHOND-NA HYALUR IO KIT
PACK | INTRAOCULAR | Status: DC | PRN
  Administered 2021-05-07: 1 via OPHTHALMIC

## 2021-05-07 MED ORDER — TETRACAINE HCL 0.5 % OP SOLN
1.0000 [drp] | OPHTHALMIC | Status: DC | PRN
  Administered 2021-05-07 (×3): 1 [drp] via OPHTHALMIC

## 2021-05-07 SURGICAL SUPPLY — 10 items
CATARACT SUITE SIGHTPATH (MISCELLANEOUS) ×2 IMPLANT
FEE CATARACT SUITE SIGHTPATH (MISCELLANEOUS) ×1 IMPLANT
GLOVE SURG ENC TEXT LTX SZ8 (GLOVE) ×2 IMPLANT
GLOVE SURG TRIUMPH 8.0 PF LTX (GLOVE) ×2 IMPLANT
LENS IOL TECNIS EYHANCE 20.5 (Intraocular Lens) ×1 IMPLANT
NDL FILTER BLUNT 18X1 1/2 (NEEDLE) ×1 IMPLANT
NEEDLE FILTER BLUNT 18X 1/2SAF (NEEDLE) ×1
NEEDLE FILTER BLUNT 18X1 1/2 (NEEDLE) ×1 IMPLANT
SYR 3ML LL SCALE MARK (SYRINGE) ×2 IMPLANT
WATER STERILE IRR 250ML POUR (IV SOLUTION) ×2 IMPLANT

## 2021-05-07 NOTE — Anesthesia Preprocedure Evaluation (Signed)
Anesthesia Evaluation  Patient identified by MRN, date of birth, ID band Patient awake    Reviewed: Allergy & Precautions, NPO status , Patient's Chart, lab work & pertinent test results  Airway Mallampati: II  TM Distance: >3 FB Neck ROM: Full    Dental no notable dental hx.    Pulmonary neg pulmonary ROS,    Pulmonary exam normal        Cardiovascular hypertension, Normal cardiovascular exam     Neuro/Psych PSYCHIATRIC DISORDERS Depression TIA   GI/Hepatic GERD  Medicated,  Endo/Other  diabetes, Type 2, Insulin Dependent  Renal/GU Renal InsufficiencyRenal disease     Musculoskeletal  (+) Arthritis ,   Abdominal Normal abdominal exam  (+)   Peds  Hematology negative hematology ROS (+)   Anesthesia Other Findings   Reproductive/Obstetrics                             Anesthesia Physical Anesthesia Plan  ASA: 3  Anesthesia Plan: MAC   Post-op Pain Management: Minimal or no pain anticipated   Induction: Intravenous  PONV Risk Score and Plan: 2 and TIVA, Midazolam and Treatment may vary due to age or medical condition  Airway Management Planned: Natural Airway and Nasal Cannula  Additional Equipment: None  Intra-op Plan:   Post-operative Plan:   Informed Consent: I have reviewed the patients History and Physical, chart, labs and discussed the procedure including the risks, benefits and alternatives for the proposed anesthesia with the patient or authorized representative who has indicated his/her understanding and acceptance.     Dental advisory given  Plan Discussed with: CRNA  Anesthesia Plan Comments:         Anesthesia Quick Evaluation

## 2021-05-07 NOTE — Transfer of Care (Signed)
Immediate Anesthesia Transfer of Care Note  Patient: Alison Hernandez  Procedure(s) Performed: CATARACT EXTRACTION PHACO AND INTRAOCULAR LENS PLACEMENT (IOC) RIGHT DIABETIC 19.49 02:17.9 (Right: Eye)  Patient Location: PACU  Anesthesia Type: MAC  Level of Consciousness: awake, alert  and patient cooperative  Airway and Oxygen Therapy: Patient Spontanous Breathing and Patient connected to supplemental oxygen  Post-op Assessment: Post-op Vital signs reviewed, Patient's Cardiovascular Status Stable, Respiratory Function Stable, Patent Airway and No signs of Nausea or vomiting  Post-op Vital Signs: Reviewed and stable  Complications: No notable events documented.

## 2021-05-07 NOTE — Anesthesia Postprocedure Evaluation (Signed)
Anesthesia Post Note  Patient: Alison Hernandez  Procedure(s) Performed: CATARACT EXTRACTION PHACO AND INTRAOCULAR LENS PLACEMENT (IOC) RIGHT DIABETIC 19.49 02:17.9 (Right: Eye)     Patient location during evaluation: PACU Anesthesia Type: MAC Level of consciousness: awake and alert Pain management: pain level controlled Vital Signs Assessment: post-procedure vital signs reviewed and stable Respiratory status: nonlabored ventilation and spontaneous breathing Cardiovascular status: blood pressure returned to baseline Postop Assessment: no apparent nausea or vomiting Anesthetic complications: no   No notable events documented.  Marilyn Wing [employer]

## 2021-05-07 NOTE — Op Note (Signed)
PREOPERATIVE DIAGNOSIS:  Nuclear sclerotic cataract of the right eye.   POSTOPERATIVE DIAGNOSIS:  H25.011 Cortical Cataract H25.11 Nuclear Cataract H25.041 Posterior Cataract   OPERATIVE PROCEDURE:ORPROCALL@   SURGEON:  Deberah Pelton, MD.   ANESTHESIA:  Anesthesiologist: Fletcher Anon, MD CRNA: Maree Krabbe, CRNA  1.      Managed anesthesia care. 2.      0.4ml of Shugarcaine was instilled in the eye following the paracentesis.   COMPLICATIONS:  None.   TECHNIQUE:   Stop and chop   DESCRIPTION OF PROCEDURE:  The patient was examined and consented in the preoperative holding area where the aforementioned topical anesthesia was applied to the right eye and then brought back to the Operating Room where the right eye was prepped and draped in the usual sterile ophthalmic fashion and a lid speculum was placed. A paracentesis was created with the side port blade and the anterior chamber was filled with viscoelastic. A near clear corneal incision was performed with the steel keratome. A continuous curvilinear capsulorrhexis was performed with a cystotome followed by the capsulorrhexis forceps. Hydrodissection and hydrodelineation were carried out with BSS on a blunt cannula. The lens was removed in a stop and chop  technique and the remaining cortical material was removed with the irrigation-aspiration handpiece. An anterior rent was noted nasally and did not propogate posteriorly past the equator of the bag.The capsular bag was inflated with viscoelastic and the Technis DIB00  lens was placed in the capsular bag without complication. The remaining viscoelastic was removed from the eye with the irrigation-aspiration handpiece. The wounds were hydrated. The anterior chamber was flushed with BSS and the eye was inflated to physiologic pressure. 0.14ml of Vigamox was placed in the anterior chamber. The wounds were found to be water tight. The eye was dressed with Combigan. The patient was given protective  glasses to wear throughout the day and a shield with which to sleep tonight. The patient was also given drops with which to begin a drop regimen today and will follow-up with me in one day. Implant Name Type Inv. Item Serial No. Manufacturer Lot No. LRB No. Used Action  LENS IOL TECNIS EYHANCE 20.5 - [id number] Intraocular Lens LENS IOL TECNIS EYHANCE 20.5 [id number] [employer]  Right 1 Implanted   Procedure(s) with comments: CATARACT EXTRACTION PHACO AND INTRAOCULAR LENS PLACEMENT (IOC) RIGHT DIABETIC 19.49 02:17.9 (Right) - Diabetic  Electronically signed: Galen Manila 05/07/2021 1:44 PM

## 2021-05-07 NOTE — H&P (Signed)
[facility]   Primary Care Physician:  Valerie Roys, DO Ophthalmologist: Dr. Merleen Nicely  Pre-Procedure History & Physical: HPI:  Alison Hernandez is a 76 y.o. female here for cataract surgery.   Past Medical History:  Diagnosis Date   Arthritis    hands   Diabetes mellitus without complication (HCC)    GERD (gastroesophageal reflux disease)    Hyperlipidemia    Hypertension    TIA (transient ischemic attack)    no deficits   Vertigo     Past Surgical History:  Procedure Laterality Date   BREAST BIOPSY Left 20+ yrs ago   benign   BREAST BIOPSY Left 20+ yrs ago   benign   ROTATOR CUFF REPAIR Right 09/30/2016   SIGMOIDOSCOPY  1990   TOTAL ABDOMINAL HYSTERECTOMY     total    Prior to Admission medications   Medication Sig Start Date End Date Taking? Authorizing Provider  aspirin EC 81 MG tablet Take 81 mg by mouth daily. Swallow whole.   Yes [provider]  Dulaglutide (TRULICITY) 4.5 TD/9.7CB SOPN Inject 4.5 mg into the skin once a week. 03/26/21 06/24/21 Yes Johnson, Megan P, DO  lisinopril-hydrochlorothiazide (ZESTORETIC) 20-25 MG tablet Take 1 tablet by mouth daily. 03/26/21  Yes Johnson, Megan P, DO  metFORMIN (GLUCOPHAGE) 1000 MG tablet Take 1 tablet (1,000 mg total) by mouth 2 (two) times daily with a meal. 03/26/21  Yes Johnson, Megan P, DO  Multiple Vitamin (MULTIVITAMIN) tablet Take 1 tablet by mouth daily.   Yes [provider]  NYSTATIN powder APPLY  POWDER TOPICALLY 4 TIMES DAILY 07/11/19  Yes Johnson, Megan P, DO  omeprazole (PRILOSEC) 20 MG capsule Take 2 capsules (40 mg total) by mouth daily. 03/26/21  Yes Johnson, Megan P, DO  rosuvastatin (CRESTOR) 10 MG tablet Take 1 tablet (10 mg total) by mouth daily. 03/26/21  Yes Johnson, Megan P, DO  Blood Glucose Monitoring Suppl (ONE TOUCH ULTRA 2) w/Device KIT Test 3x a day 05/06/17   Park Liter P, DO  buPROPion (WELLBUTRIN XL) 300 MG 24 hr tablet Take 1 tablet (300 mg total) by mouth  daily. Patient not taking: Reported on 04/23/2021 03/26/21   Park Liter P, DO  glucose blood (ONETOUCH ULTRA) test strip CHECK GLUCOSE ONCE DAILY 11/05/20   Park Liter P, DO  glucose blood test strip Use as instructed 05/19/19   Park Liter P, DO  meclizine (ANTIVERT) 25 MG tablet Take 1 tablet (25 mg total) by mouth 3 (three) times daily as needed for dizziness. Patient not taking: Reported on 04/23/2021 07/19/19   Park Liter P, DO  ondansetron (ZOFRAN-ODT) 8 MG disintegrating tablet DISSOLVE 1 TABLET IN MOUTH EVERY 8 HOURS AS NEEDED FOR NAUSEA OR VOMITING Patient not taking: Reported on 04/23/2021 01/17/20   Valerie Roys, DO  OneTouch Delica Lancets 63A MISC Test 3x a day 11/05/20   Park Liter P, DO    Allergies as of 04/12/2021 - Review Complete 03/26/2021  Allergen Reaction Noted   Lovastatin Nausea And Vomiting 08/29/2015   Bydureon [exenatide] Rash 12/19/2015   Losartan Rash 05/24/2020    Family History  Problem Relation Age of Onset   Diabetes Mother    Heart disease Mother    Cancer Father        esophagus/ lung   Diabetes Maternal Grandmother    Cancer Sister        Ovarian    Social History   Socioeconomic History   Marital status:  Married    Spouse name: Not on file   Number of children: Not on file   Years of education: Not on file   Highest education level: Not on file  Occupational History   Not on file  Tobacco Use   Smoking status: Never   Smokeless tobacco: Never  Vaping Use   Vaping Use: Never used  Substance and Sexual Activity   Alcohol use: No   Drug use: No   Sexual activity: Not Currently    Birth control/protection: None  Other Topics Concern   Not on file  Social History Narrative   Not on file   Social Determinants of Health   Financial Resource Strain: Low Risk    Difficulty of Paying Living Expenses: Not hard at all  Food Insecurity: No Food Insecurity   Worried About Charity fundraiser in the Last Year: Never true    [place] in the Last Year: Never true  Transportation Needs: No Transportation Needs   Lack of Transportation (Medical): No   Lack of Transportation (Non-Medical): No  Physical Activity: Inactive   Days of Exercise per Week: 0 days   Minutes of Exercise per Session: 0 min  Stress: No Stress Concern Present   Feeling of Stress : Not at all  Social Connections: Unknown   Frequency of Communication with Friends and Family: More than three times a week   Frequency of Social Gatherings with Friends and Family: More than three times a week   Attends Religious Services: Not on Electrical engineer or Organizations: Not on file   Attends Archivist Meetings: Not on file   Marital Status: Not on file  Intimate Partner Violence: Not At Risk   Fear of Current or Ex-Partner: No   Emotionally Abused: No   Physically Abused: No   Sexually Abused: No    Review of Systems: See HPI, otherwise negative ROS  Physical Exam: BP (!) 161/79    Pulse 91    Temp 97.8 F (36.6 C) (Temporal)    Ht 5' 6"  (1.676 m)    Wt 68 kg    SpO2 100%    BMI 24.21 kg/m  General:   Alert, cooperative in NAD Head:  Normocephalic and atraumatic. Respiratory:  Normal work of breathing. Cardiovascular:  RRR  Impression/Plan: Alison Hernandez is here for cataract surgery.  Risks, benefits, limitations, and alternatives regarding cataract surgery have been reviewed with the patient.  Questions have been answered.  All parties agreeable.   Birder Robson, MD  05/07/2021, 12:42 PM

## 2021-05-07 NOTE — Anesthesia Procedure Notes (Signed)
Procedure Name: MAC Date/Time: 05/07/2021 12:56 PM Performed by: Cameron Ali, CRNA Pre-anesthesia Checklist: Patient identified, Emergency Drugs available, Suction available, Timeout performed and Patient being monitored Patient Re-evaluated:Patient Re-evaluated prior to induction Oxygen Delivery Method: Nasal cannula Placement Confirmation: positive ETCO2

## 2021-05-08 ENCOUNTER — Encounter: Payer: Self-pay | Admitting: Ophthalmology

## 2021-05-08 ENCOUNTER — Other Ambulatory Visit: Payer: Self-pay

## 2021-05-09 ENCOUNTER — Telehealth: Payer: Self-pay

## 2021-05-09 DIAGNOSIS — H2512 Age-related nuclear cataract, left eye: Secondary | ICD-10-CM | POA: Diagnosis not present

## 2021-05-09 NOTE — Chronic Care Management (AMB) (Signed)
° ° °  Chronic Care Management Pharmacy Assistant   Name: AKEELA BUSK  MRN: [id number] DOB: 09-15-1945  Reason for Encounter: Disease State General  Recent office visits:  03/26/21-Megan Annia Friendly, DO (PCP) Seen for annual exam. Labs ordered. Ambulatory referral to Ophthalmology. Mammo, DEXA and colonoscopy ordered. Follow up in 3 months.  Recent consult visits:  None noted  Hospital visits:  None in previous 6 months  Medications: Outpatient Encounter Medications as of 05/09/2021  Medication Sig   aspirin EC 81 MG tablet Take 81 mg by mouth daily. Swallow whole.   Blood Glucose Monitoring Suppl (ONE TOUCH ULTRA 2) w/Device KIT Test 3x a day   buPROPion (WELLBUTRIN XL) 300 MG 24 hr tablet Take 1 tablet (300 mg total) by mouth daily. (Patient not taking: Reported on 04/23/2021)   Dulaglutide (TRULICITY) 4.5 KN/3.9JQ SOPN Inject 4.5 mg into the skin once a week.   glucose blood (ONETOUCH ULTRA) test strip CHECK GLUCOSE ONCE DAILY   glucose blood test strip Use as instructed   lisinopril-hydrochlorothiazide (ZESTORETIC) 20-25 MG tablet Take 1 tablet by mouth daily.   meclizine (ANTIVERT) 25 MG tablet Take 1 tablet (25 mg total) by mouth 3 (three) times daily as needed for dizziness. (Patient not taking: Reported on 04/23/2021)   metFORMIN (GLUCOPHAGE) 1000 MG tablet Take 1 tablet (1,000 mg total) by mouth 2 (two) times daily with a meal.   Multiple Vitamin (MULTIVITAMIN) tablet Take 1 tablet by mouth daily.   NYSTATIN powder APPLY  POWDER TOPICALLY 4 TIMES DAILY   omeprazole (PRILOSEC) 20 MG capsule Take 2 capsules (40 mg total) by mouth daily.   ondansetron (ZOFRAN-ODT) 8 MG disintegrating tablet DISSOLVE 1 TABLET IN MOUTH EVERY 8 HOURS AS NEEDED FOR NAUSEA OR VOMITING (Patient not taking: Reported on 7/34/1937)   OneTouch Delica Lancets 90W MISC Test 3x a day   rosuvastatin (CRESTOR) 10 MG tablet Take 1 tablet (10 mg total) by mouth daily.   No facility-administered encounter  medications on file as of 05/09/2021.   Have you had any problems recently with your health? Patient states she has been doing well she recently had cataract surgery and will be getting another cataract surgery on the 05/21/21.  Have you had any problems with your pharmacy? Patient states she has no problems with her pharmacy.   What issues or side effects are you having with your medications? Patient states she has no issues or side effects with her medications.   What would you like me to pass along to Edison Nasuti Potts,CPP for them to help you with?  Patient states her Truliciity is $40 at her local [facility] she will be filling out her Trulicity patient assistance application and will provide proof of income and will bring the form into the office.   What can we do to take care of you better? Patient states there is nothing  at this time.   Patient states she normally works from 8-5pm and could possibly not be able to answer for her appointment next month. I stated she can always call me to reschedule if she needs to.   Care Gaps: Zoster Vaccines- Shingrix:Never done COVID-19 Vaccine:Last completed: Jun 28, 2019  Star Rating Drugs: Lisinopril-hydrochlorothiazide 20-25 mg Last filled:04/11/21  Metformin 1000 mg Last filled:04/11/21 Rosuvastatin 10 mg Last filled:04/11/21  Corrie Mckusick, [place]

## 2021-05-16 NOTE — Discharge Instructions (Signed)

## 2021-05-21 ENCOUNTER — Encounter
Admission: RE | Disposition: A | Discharge status: Home / Self Care | Payer: Self-pay | Source: Home / Self Care | Attending: Ophthalmology

## 2021-05-21 ENCOUNTER — Encounter: Payer: Self-pay | Admitting: Ophthalmology

## 2021-05-21 ENCOUNTER — Ambulatory Visit
Admission: RE | Admit: 2021-05-21 | Discharge: 2021-05-21 | Disposition: A | Discharge status: Home / Self Care | Payer: Medicare Other | Attending: Ophthalmology | Admitting: Ophthalmology

## 2021-05-21 ENCOUNTER — Other Ambulatory Visit: Payer: Self-pay

## 2021-05-21 ENCOUNTER — Ambulatory Visit: Payer: Medicare Other | Admitting: Anesthesiology

## 2021-05-21 DIAGNOSIS — H2512 Age-related nuclear cataract, left eye: Secondary | ICD-10-CM | POA: Diagnosis not present

## 2021-05-21 DIAGNOSIS — I10 Essential (primary) hypertension: Secondary | ICD-10-CM | POA: Diagnosis not present

## 2021-05-21 DIAGNOSIS — H25812 Combined forms of age-related cataract, left eye: Secondary | ICD-10-CM | POA: Diagnosis not present

## 2021-05-21 DIAGNOSIS — K219 Gastro-esophageal reflux disease without esophagitis: Secondary | ICD-10-CM | POA: Diagnosis not present

## 2021-05-21 DIAGNOSIS — E1136 Type 2 diabetes mellitus with diabetic cataract: Secondary | ICD-10-CM | POA: Diagnosis not present

## 2021-05-21 HISTORY — PX: CATARACT EXTRACTION W/PHACO: SHX586

## 2021-05-21 LAB — GLUCOSE, CAPILLARY
Glucose-Capillary: 118 mg/dL — ABNORMAL HIGH (ref 70–99)
Glucose-Capillary: 121 mg/dL — ABNORMAL HIGH (ref 70–99)

## 2021-05-21 SURGERY — PHACOEMULSIFICATION, CATARACT, WITH IOL INSERTION
Anesthesia: Monitor Anesthesia Care | Site: Eye | Laterality: Left

## 2021-05-21 MED ORDER — SIGHTPATH DOSE#1 BSS IO SOLN
INTRAOCULAR | Status: DC | PRN
  Administered 2021-05-21: 138 mL via OPHTHALMIC

## 2021-05-21 MED ORDER — MIDAZOLAM HCL 2 MG/2ML IJ SOLN
INTRAMUSCULAR | Status: DC | PRN
  Administered 2021-05-21: 1 mg via INTRAVENOUS

## 2021-05-21 MED ORDER — ARMC OPHTHALMIC DILATING DROPS
1.0000 "application " | OPHTHALMIC | Status: AC | PRN
  Administered 2021-05-21 (×3): 1 via OPHTHALMIC

## 2021-05-21 MED ORDER — SIGHTPATH DOSE#1 NA HYALUR & NA CHOND-NA HYALUR IO KIT
PACK | INTRAOCULAR | Status: DC | PRN
  Administered 2021-05-21: 1 via OPHTHALMIC

## 2021-05-21 MED ORDER — LACTATED RINGERS IV SOLN: INTRAVENOUS | Status: DC

## 2021-05-21 MED ORDER — TETRACAINE HCL 0.5 % OP SOLN
1.0000 [drp] | OPHTHALMIC | Status: AC | PRN
  Administered 2021-05-21 (×3): 1 [drp] via OPHTHALMIC

## 2021-05-21 MED ORDER — SIGHTPATH DOSE#1 BSS IO SOLN
INTRAOCULAR | Status: DC | PRN
Start: 2021-05-21 — End: 2021-05-21
  Administered 2021-05-21: 15 mL

## 2021-05-21 MED ORDER — FENTANYL CITRATE (PF) 100 MCG/2ML IJ SOLN
INTRAMUSCULAR | Status: DC | PRN
  Administered 2021-05-21: 50 ug via INTRAVENOUS

## 2021-05-21 MED ORDER — TETRACAINE 0.5 % OP SOLN OPTIME - NO CHARGE
OPHTHALMIC | Status: DC | PRN
Start: 2021-05-21 — End: 2021-05-21
  Administered 2021-05-21: 1 [drp] via OPHTHALMIC

## 2021-05-21 MED ORDER — MOXIFLOXACIN HCL 0.5 % OP SOLN
OPHTHALMIC | Status: DC | PRN
  Administered 2021-05-21: 0.2 mL via OPHTHALMIC

## 2021-05-21 MED ORDER — LIDOCAINE HCL (PF) 2 % IJ SOLN
INTRAOCULAR | Status: DC | PRN
  Administered 2021-05-21: 1 mL via INTRAOCULAR

## 2021-05-21 MED ORDER — BRIMONIDINE TARTRATE-TIMOLOL 0.2-0.5 % OP SOLN
OPHTHALMIC | Status: DC | PRN
Start: 2021-05-21 — End: 2021-05-21
  Administered 2021-05-21: 1 [drp] via OPHTHALMIC

## 2021-05-21 SURGICAL SUPPLY — 16 items
CATARACT SUITE SIGHTPATH (MISCELLANEOUS) ×2 IMPLANT
DISSECTOR HYDRO NUCLEUS 50X22 (MISCELLANEOUS) ×2 IMPLANT
DRSG TEGADERM 2-3/8X2-3/4 SM (GAUZE/BANDAGES/DRESSINGS) ×2 IMPLANT
FEE CATARACT SUITE SIGHTPATH (MISCELLANEOUS) ×1 IMPLANT
GLOVE SURG GAMMEX PI TX LF 7.5 (GLOVE) ×2 IMPLANT
GLOVE SURG SYN 8.5  E (GLOVE) ×1
GLOVE SURG SYN 8.5 E (GLOVE) ×1 IMPLANT
GLOVE SURG SYN 8.5 PF PI (GLOVE) ×1 IMPLANT
LENS IOL TECNIS EYHANCE 20.5 (Intraocular Lens) ×1 IMPLANT
NDL FILTER BLUNT 18X1 1/2 (NEEDLE) ×1 IMPLANT
NEEDLE FILTER BLUNT 18X 1/2SAF (NEEDLE) ×1
NEEDLE FILTER BLUNT 18X1 1/2 (NEEDLE) ×1 IMPLANT
SYR 3ML LL SCALE MARK (SYRINGE) ×2 IMPLANT
SYR 5ML LL (SYRINGE) ×2 IMPLANT
TIP IRRIGATON/ASPIRATION (MISCELLANEOUS) ×1 IMPLANT
WATER STERILE IRR 250ML POUR (IV SOLUTION) ×2 IMPLANT

## 2021-05-21 NOTE — H&P (Signed)
[facility]   Primary Care Physician:  Valerie Roys, DO Ophthalmologist: Dr. Merleen Nicely  Pre-Procedure History & Physical: HPI:  Alison Hernandez is a 76 y.o. female here for cataract surgery.   Past Medical History:  Diagnosis Date   Arthritis    hands   Diabetes mellitus without complication (HCC)    GERD (gastroesophageal reflux disease)    Hyperlipidemia    Hypertension    TIA (transient ischemic attack)    no deficits   Vertigo     Past Surgical History:  Procedure Laterality Date   BREAST BIOPSY Left 20+ yrs ago   benign   BREAST BIOPSY Left 20+ yrs ago   benign   CATARACT EXTRACTION W/PHACO Right 05/07/2021   Procedure: CATARACT EXTRACTION PHACO AND INTRAOCULAR LENS PLACEMENT ([place]) RIGHT DIABETIC 19.49 02:17.9;  Surgeon: Birder Robson, MD;  Location: [place];  Service: Ophthalmology;  Laterality: Right;  Diabetic   ROTATOR CUFF REPAIR Right 09/30/2016   SIGMOIDOSCOPY  1990   TOTAL ABDOMINAL HYSTERECTOMY     total    Prior to Admission medications   Medication Sig Start Date End Date Taking? Authorizing Provider  aspirin EC 81 MG tablet Take 81 mg by mouth daily. Swallow whole.   Yes [provider]  Dulaglutide (TRULICITY) 4.5 QM/5.7QI SOPN Inject 4.5 mg into the skin once a week. 03/26/21 06/24/21 Yes Johnson, Megan P, DO  lisinopril-hydrochlorothiazide (ZESTORETIC) 20-25 MG tablet Take 1 tablet by mouth daily. 03/26/21  Yes Johnson, Megan P, DO  metFORMIN (GLUCOPHAGE) 1000 MG tablet Take 1 tablet (1,000 mg total) by mouth 2 (two) times daily with a meal. 03/26/21  Yes Johnson, Megan P, DO  Multiple Vitamin (MULTIVITAMIN) tablet Take 1 tablet by mouth daily.   Yes [provider]  NYSTATIN powder APPLY  POWDER TOPICALLY 4 TIMES DAILY 07/11/19  Yes Johnson, Megan P, DO  omeprazole (PRILOSEC) 20 MG capsule Take 2 capsules (40 mg total) by mouth daily. 03/26/21  Yes Johnson, Megan P, DO  rosuvastatin (CRESTOR) 10 MG tablet Take 1  tablet (10 mg total) by mouth daily. 03/26/21  Yes Johnson, Megan P, DO  Blood Glucose Monitoring Suppl (ONE TOUCH ULTRA 2) w/Device KIT Test 3x a day 05/06/17   Park Liter P, DO  buPROPion (WELLBUTRIN XL) 300 MG 24 hr tablet Take 1 tablet (300 mg total) by mouth daily. Patient not taking: Reported on 04/23/2021 03/26/21   Park Liter P, DO  glucose blood (ONETOUCH ULTRA) test strip CHECK GLUCOSE ONCE DAILY 11/05/20   Park Liter P, DO  glucose blood test strip Use as instructed 05/19/19   Park Liter P, DO  meclizine (ANTIVERT) 25 MG tablet Take 1 tablet (25 mg total) by mouth 3 (three) times daily as needed for dizziness. Patient not taking: Reported on 04/23/2021 07/19/19   Park Liter P, DO  ondansetron (ZOFRAN-ODT) 8 MG disintegrating tablet DISSOLVE 1 TABLET IN MOUTH EVERY 8 HOURS AS NEEDED FOR NAUSEA OR VOMITING Patient not taking: Reported on 04/23/2021 01/17/20   Valerie Roys, DO  OneTouch Delica Lancets 69G MISC Test 3x a day 11/05/20   Park Liter P, DO    Allergies as of 04/12/2021 - Review Complete 03/26/2021  Allergen Reaction Noted   Lovastatin Nausea And Vomiting 08/29/2015   Bydureon [exenatide] Rash 12/19/2015   Losartan Rash 05/24/2020    Family History  Problem Relation Age of Onset   Diabetes Mother    Heart disease Mother    Cancer Father  esophagus/ lung   Diabetes Maternal Grandmother    Cancer Sister        Ovarian    Social History   Socioeconomic History   Marital status: Married    Spouse name: Not on file   Number of children: Not on file   Years of education: Not on file   Highest education level: Not on file  Occupational History   Not on file  Tobacco Use   Smoking status: Never   Smokeless tobacco: Never  Vaping Use   Vaping Use: Never used  Substance and Sexual Activity   Alcohol use: No   Drug use: No   Sexual activity: Not Currently    Birth control/protection: None  Other Topics Concern   Not on file  Social  History Narrative   Not on file   Social Determinants of Health   Financial Resource Strain: Low Risk    Difficulty of Paying Living Expenses: Not hard at all  Food Insecurity: No Food Insecurity   Worried About Charity fundraiser in the Last Year: Never true   [place] in the Last Year: Never true  Transportation Needs: No Transportation Needs   Lack of Transportation (Medical): No   Lack of Transportation (Non-Medical): No  Physical Activity: Inactive   Days of Exercise per Week: 0 days   Minutes of Exercise per Session: 0 min  Stress: No Stress Concern Present   Feeling of Stress : Not at all  Social Connections: Unknown   Frequency of Communication with Friends and Family: More than three times a week   Frequency of Social Gatherings with Friends and Family: More than three times a week   Attends Religious Services: Not on Electrical engineer or Organizations: Not on file   Attends Archivist Meetings: Not on file   Marital Status: Not on file  Intimate Partner Violence: Not At Risk   Fear of Current or Ex-Partner: No   Emotionally Abused: No   Physically Abused: No   Sexually Abused: No    Review of Systems: See HPI, otherwise negative ROS  Physical Exam: BP (!) 158/77    Pulse 87    Temp 97.9 F (36.6 C) (Temporal)    Resp 16    Ht 5' 6"  (1.676 m)    Wt 68 kg    SpO2 100%    BMI 24.21 kg/m  General:   Alert, cooperative in NAD Head:  Normocephalic and atraumatic. Respiratory:  Normal work of breathing. Cardiovascular:  RRR  Impression/Plan: Alison Hernandez is here for cataract surgery.  Risks, benefits, limitations, and alternatives regarding cataract surgery have been reviewed with the patient.  Questions have been answered.  All parties agreeable.   Birder Robson, MD  05/21/2021, 12:59 PM

## 2021-05-21 NOTE — Anesthesia Preprocedure Evaluation (Signed)
Anesthesia Evaluation  Patient identified by MRN, date of birth, ID band Patient awake    Reviewed: Allergy & Precautions, H&P , NPO status , Patient's Chart, lab work & pertinent test results, reviewed documented beta blocker date and time   Airway Mallampati: II  TM Distance: >3 FB Neck ROM: full    Dental no notable dental hx.    Pulmonary neg pulmonary ROS,    Pulmonary exam normal breath sounds clear to auscultation       Cardiovascular Exercise Tolerance: Good hypertension,  Rhythm:regular Rate:Normal     Neuro/Psych TIAnegative psych ROS   GI/Hepatic Neg liver ROS, GERD  ,  Endo/Other  diabetes  Renal/GU negative Renal ROS  negative genitourinary   Musculoskeletal   Abdominal   Peds  Hematology negative hematology ROS (+)   Anesthesia Other Findings   Reproductive/Obstetrics negative OB ROS                             Anesthesia Physical Anesthesia Plan  ASA: 2  Anesthesia Plan: MAC   Post-op Pain Management:    Induction:   PONV Risk Score and Plan:   Airway Management Planned:   Additional Equipment:   Intra-op Plan:   Post-operative Plan:   Informed Consent: I have reviewed the patients History and Physical, chart, labs and discussed the procedure including the risks, benefits and alternatives for the proposed anesthesia with the patient or authorized representative who has indicated his/her understanding and acceptance.     Dental Advisory Given  Plan Discussed with: CRNA and Anesthesiologist  Anesthesia Plan Comments:         Anesthesia Quick Evaluation

## 2021-05-21 NOTE — Transfer of Care (Signed)
Immediate Anesthesia Transfer of Care Note  Patient: Alison Hernandez  Procedure(s) Performed: CATARACT EXTRACTION PHACO AND INTRAOCULAR LENS PLACEMENT ([facility]) LEFT DIABETIC 18.57 01:42.9  (Left: Eye)  Patient Location: PACU  Anesthesia Type: MAC  Level of Consciousness: awake, alert  and patient cooperative  Airway and Oxygen Therapy: Patient Spontanous Breathing and Patient connected to supplemental oxygen  Post-op Assessment: Post-op Vital signs reviewed, Patient's Cardiovascular Status Stable, Respiratory Function Stable, Patent Airway and No signs of Nausea or vomiting  Post-op Vital Signs: Reviewed and stable  Complications: No notable events documented.

## 2021-05-21 NOTE — Anesthesia Postprocedure Evaluation (Signed)
Anesthesia Post Note  Patient: Alison Hernandez  Procedure(s) Performed: CATARACT EXTRACTION PHACO AND INTRAOCULAR LENS PLACEMENT ([facility]) LEFT DIABETIC 18.57 01:42.9  (Left: Eye)     Patient location during evaluation: PACU Anesthesia Type: MAC Level of consciousness: awake and alert Pain management: pain level controlled Vital Signs Assessment: post-procedure vital signs reviewed and stable Respiratory status: spontaneous breathing, nonlabored ventilation, respiratory function stable and patient connected to nasal cannula oxygen Cardiovascular status: stable and blood pressure returned to baseline Postop Assessment: no apparent nausea or vomiting Anesthetic complications: no   No notable events documented.  Trecia Rogers

## 2021-05-21 NOTE — Op Note (Addendum)
PREOPERATIVE DIAGNOSIS:  Nuclear sclerotic cataract of the left eye.   POSTOPERATIVE DIAGNOSIS:  Nuclear sclerotic cataract of the left eye.   OPERATIVE PROCEDURE:ORPROCALL@   SURGEON:  Deberah Pelton, MD.   ANESTHESIA:  Anesthesiologist: Baxter Flattery, MD CRNA: Jimmy Picket, CRNA; Maree Krabbe, CRNA  1.      Managed anesthesia care. 2.     0.95ml of Shugarcaine was instilled following the paracentesis   COMPLICATIONS:  None.   TECHNIQUE:   Stop and chop   DESCRIPTION OF PROCEDURE:  The patient was examined and consented in the preoperative holding area where the aforementioned topical anesthesia was applied to the left eye and then brought back to the Operating Room where the left eye was prepped and draped in the usual sterile ophthalmic fashion and a lid speculum was placed. A paracentesis was created with the side port blade and the anterior chamber was filled with viscoelastic. A near clear corneal incision was performed with the steel keratome. A continuous curvilinear capsulorrhexis was performed with a cystotome followed by the capsulorrhexis forceps. Hydrodissection and hydrodelineation were carried out with BSS on a blunt cannula. The lens was removed in a tilt and tumble technique and the remaining cortical material was removed with the irrigation-aspiration handpiece. The capsular bag was inflated with viscoelastic and the Technis DIB00 lens was placed in the capsular bag without complication. The remaining viscoelastic was removed from the eye with the irrigation-aspiration handpiece. The wounds were hydrated. The anterior chamber was flushed with BSS and the eye was inflated to physiologic pressure. 0.54ml Vigamox was placed in the anterior chamber. The wounds were found to be water tight. The eye was dressed with Combigan. The patient was given protective glasses to wear throughout the day and a shield with which to sleep tonight. The patient was also given drops with which to begin  a drop regimen today and will follow-up with me in one day. Implant Name Type Inv. Item Serial No. Manufacturer Lot No. LRB No. Used Action  LENS IOL TECNIS EYHANCE 20.5 - [id number] Intraocular Lens LENS IOL TECNIS EYHANCE 20.5 [id number] [employer]  Left 1 Implanted    Procedure(s) with comments: CATARACT EXTRACTION PHACO AND INTRAOCULAR LENS PLACEMENT ([facility]) LEFT DIABETIC 18.57 01:42.9  (Left) - Diabetic  I was present in the OR for the entirety of the operation.  Electronically signed: Galen Manila 05/21/2021 1:45 PM

## 2021-05-21 NOTE — Anesthesia Procedure Notes (Signed)
Procedure Name: MAC Date/Time: 05/21/2021 1:09 PM Performed by: Cameron Ali, CRNA Pre-anesthesia Checklist: Patient identified, Emergency Drugs available, Suction available, Timeout performed and Patient being monitored Patient Re-evaluated:Patient Re-evaluated prior to induction Oxygen Delivery Method: Nasal cannula Placement Confirmation: positive ETCO2

## 2021-06-03 ENCOUNTER — Telehealth: Payer: Medicare Other

## 2021-06-03 NOTE — Progress Notes (Deleted)
? ? ?Chronic Care Management ?Pharmacy Note ? ?06/03/2021 ?Name:  Alison Hernandez MRN:  [id number] DOB:  1945/07/04 ? ?Summary: ? ?Recommendations/Changes made from today's visit: ? ?Plan: ? ?Subjective: ?Alison Hernandez is an 76 y.o. year old female who is a primary patient of Valerie Roys, DO.  The CCM team was consulted for assistance with disease management and care coordination needs.   ? ?{CCMTELEPHONEFACETOFACE:21091510} for {CCMINITIALFOLLOWUPCHOICE:21091511} in response to provider referral for pharmacy case management and/or care coordination services.  ? ?Consent to Services:  ?{CCMCONSENTOPTIONS:25074} ? ?Patient Care Team: ?Valerie Roys, DO as PCP - General (Family Medicine) ?Tania Ade, MD as Consulting Physician (Orthopedic Surgery) ?Hyatt, Max T, DPM as Veterinary surgeon) ?Vanita Ingles, RN as Registered Nurse (General Practice) ? ?Recent office visits: ?*** ? ?Recent consult visits: ?*** ? ?Hospital visits: ?{Hospital DC Yes/No:21091515} ? ?Objective: ? ?Lab Results  ?Component Value Date  ? CREATININE 1.20 (H) 03/26/2021  ? CREATININE 1.11 (H) 08/02/2020  ? CREATININE 1.27 (H) 04/04/2020  ? ? ?Lab Results  ?Component Value Date  ? HGBA1C 7.4 (H) 03/26/2021  ? ?Last diabetic Eye exam:  ?Lab Results  ?Component Value Date/Time  ? HMDIABEYEEXA No Retinopathy 05/09/2021 12:00 AM  ?  ?Last diabetic Foot exam: No results found for: HMDIABFOOTEX  ? ?   ?Component Value Date/Time  ? CHOL 155 03/26/2021 1416  ? CHOL 228 (H) 10/25/2015 1052  ? TRIG 121 03/26/2021 1416  ? TRIG 162 (H) 10/25/2015 1052  ? HDL 58 03/26/2021 1416  ? VLDL 32 (H) 10/25/2015 1052  ? [place] 76 03/26/2021 1416  ? ? ?Hepatic Function Latest Ref Rng & Units 03/26/2021 08/02/2020 04/04/2020  ?Total Protein 6.0 - 8.5 g/dL 6.4 6.6 7.3  ?Albumin 3.7 - 4.7 g/dL 4.3 4.3 4.5  ?AST 0 - 40 IU/L 16 11 7   ?ALT 0 - 32 IU/L 14 8 14   ?Alk Phosphatase 44 - 121 IU/L 72 78 105  ?Total Bilirubin 0.0 - 1.2 mg/dL 0.4 0.3 0.4   ? ? ?Lab Results  ?Component Value Date/Time  ? TSH 3.310 03/26/2021 02:16 PM  ? TSH 3.130 03/21/2020 11:17 AM  ? ? ?CBC Latest Ref Rng & Units 03/26/2021 04/04/2020 03/21/2020  ?WBC 3.4 - 10.8 x10E3/uL 6.5 7.1 8.7  ?Hemoglobin 11.1 - 15.9 g/dL 10.0(L) 11.4 10.7(L)  ?Hematocrit 34.0 - 46.6 % 30.2(L) 33.3(L) 32.4(L)  ?Platelets 150 - 450 x10E3/uL 233 308 -  ? ? ?No results found for: VD25OH ? ?Clinical ASCVD: {YES/NO:21197} ?The 10-year ASCVD risk score (Arnett DK, et al., 2019) is: 45.3% ?  Values used to calculate the score: ?    Age: 65 years ?    Sex: Female ?    Is Non-Hispanic African American: No ?    Diabetic: Yes ?    Tobacco smoker: No ?    Systolic Blood Pressure: 527 mmHg ?    Is BP treated: Yes ?    HDL Cholesterol: 58 mg/dL ?    Total Cholesterol: 155 mg/dL   ? ?Other: (CHADS2VASc if Afib, PHQ9 if depression, MMRC or CAT for COPD, ACT, DEXA) ? ?Social History  ? ?Tobacco Use  ?Smoking Status Never  ?Smokeless Tobacco Never  ? ?BP Readings from Last 3 Encounters:  ?05/21/21 (!) 158/85  ?05/07/21 (!) 146/66  ?03/26/21 108/62  ? ?Pulse Readings from Last 3 Encounters:  ?05/21/21 91  ?05/07/21 76  ?03/26/21 86  ? ?Wt Readings from Last 3 Encounters:  ?05/21/21 150 lb (68 kg)  ?  05/07/21 150 lb (68 kg)  ?03/26/21 154 lb 6.4 oz (70 kg)  ? ? ?Assessment: Review of patient past medical history, allergies, medications, health status, including review of consultants reports, laboratory and other test data, was performed as part of comprehensive evaluation and provision of chronic care management services.  ? ?SDOH:  (Social Determinants of Health) assessments and interventions performed:  ? ? ?CCM Care Plan ? ?Allergies  ?Allergen Reactions  ? Lovastatin Nausea And Vomiting  ? Bydureon [Exenatide] Rash  ? Losartan Rash  ? ? ?Medications Reviewed Today   ? ? Reviewed by Dettmer, Adron Bene (Registered Nurse) on 05/21/21 at 1212  Med List Status: <None>  ? ?Medication Order Taking? Sig Documenting Provider Last Dose  Status Informant  ?aspirin EC 81 MG tablet [id number] Yes Take 81 mg by mouth daily. Swallow whole. [provider] 05/20/2021 Active   ?Blood Glucose Monitoring Suppl (ONE TOUCH ULTRA 2) w/Device KIT [id number]  Test 3x a day Valerie Roys, DO  Active   ?buPROPion (WELLBUTRIN XL) 300 MG 24 hr tablet [id number]  Take 1 tablet (300 mg total) by mouth daily.  ?Patient not taking: Reported on 04/23/2021  ? Park Liter P, DO  Active   ?Dulaglutide (TRULICITY) 4.5 YQ/6.5HQ SOPN [id number] Yes Inject 4.5 mg into the skin once a week. [place] Liter P, DO Past Week Active   ?glucose blood (ONETOUCH ULTRA) test strip [id number]  CHECK GLUCOSE ONCE DAILY [place] Liter P, DO  Active   ?glucose blood test strip [id number]  Use as instructed Valerie Roys, DO  Active   ?lisinopril-hydrochlorothiazide (ZESTORETIC) 20-25 MG tablet [id number] Yes Take 1 tablet by mouth daily. [place] Liter P, DO 05/21/2021 0900 Active   ?meclizine (ANTIVERT) 25 MG tablet [id number]  Take 1 tablet (25 mg total) by mouth 3 (three) times daily as needed for dizziness.  ?Patient not taking: Reported on 04/23/2021  ? Wynetta Emery, Megan P, DO  Active   ?metFORMIN (GLUCOPHAGE) 1000 MG tablet [id number] Yes Take 1 tablet (1,000 mg total) by mouth 2 (two) times daily with a meal. Valerie Roys, DO 05/20/2021 Active   ?Multiple Vitamin (MULTIVITAMIN) tablet [id number] Yes Take 1 tablet by mouth daily. [provider] 05/20/2021 Active   ?NYSTATIN powder [id number] Yes APPLY  POWDER TOPICALLY 4 TIMES DAILY Johnson, Megan P, DO Past Week Active   ?omeprazole (PRILOSEC) 20 MG capsule [id number] Yes Take 2 capsules (40 mg total) by mouth daily. [place] Liter P, DO 05/21/2021 0900 Active   ?ondansetron (ZOFRAN-ODT) 8 MG disintegrating tablet [id number]  DISSOLVE 1 TABLET IN MOUTH EVERY 8 HOURS AS NEEDED FOR NAUSEA OR VOMITING  ?Patient not taking: Reported on 04/23/2021  ? [place] Liter P, DO  Active   ?OneTouch Delica Lancets 51V MISC [id number]  Test  3x a day Valerie Roys, DO  Active   ?rosuvastatin (CRESTOR) 10 MG tablet [id number] Yes Take 1 tablet (10 mg total) by mouth daily. Park Liter [place], DO 05/20/2021 Active   ? ?  ?  ? ?  ? ? ?Patient Active Problem List  ? Diagnosis Date Noted  ? Dizziness 03/21/2020  ? 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 Swelling of lower limb 04/02/2016  ? Anemia 08/30/2015  ? Diabetes ([place]) 03/28/2015  ? 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 Hyperlipidemia 03/28/2015  ? Depression 03/28/2015  ? ? ?Immunization History  ?Administered Date(s) Administered  ? Fluad Quad(high Dose 65+) 11/18/2018, 01/17/2020, 12/17/2020  ? Influenza, High Dose Seasonal PF 11/28/2015, 04/08/2017, 04/07/2018  ?  Influenza,inj,Quad PF,6+ Mos 03/28/2015  ? PFIZER(Purple Top)SARS-COV-2 Vaccination 06/02/2019, 06/28/2019  ? Pneumococcal Conjugate-13 08/29/2015  ? Pneumococcal Polysaccharide-23 03/24/1997, 11/06/2016  ? Td 05/12/2008, 11/18/2018  ? ? ?Conditions to be addressed/monitored: ?{CCM ASSESSMENT DISEASE OPTIONS:25047} ? ?There are no care plans that you recently modified to display for this patient. ? ? ?Medication Assistance: {MEDASSISTANCEINFO:25044} ? ?Patient's preferred pharmacy is: ? ?[facility] [address] [place] [address] ?Phone: [phone] Fax: [phone] ? ?Uses pill box? {Yes or If no, why not?:20788} ?Pt endorses ***% compliance ? ?Follow Up:  {FOLLOWUP:24991} ? ?Plan: {CM FOLLOW UP PLAN:25073} ? ?SIG*** ? ? ? ? ?

## 2021-06-14 DIAGNOSIS — Z961 Presence of intraocular lens: Secondary | ICD-10-CM | POA: Diagnosis not present

## 2021-06-24 ENCOUNTER — Ambulatory Visit: Payer: Medicare Other | Admitting: Family Medicine

## 2021-07-29 ENCOUNTER — Telehealth: Payer: Self-pay

## 2021-07-29 NOTE — Chronic Care Management (AMB) (Signed)
? ? ?Chronic Care Management ?Pharmacy Assistant  ? ?Name: Alison Hernandez  MRN: [id number] DOB: [birth year] ? ?Reason for Encounter: Disease State General ? ?Recent office visits:  ?03/26/21-Megan Annia Friendly, DO (PCP) Seen for medical examination. Labs ordered. Mammogram ordered, bone density ordered and cologuard. Follow up in 3 months. ? ?Recent consult visits:  ?None noted ? ?Hospital visits:  ?None in previous 6 months ? ?Medications: ?Outpatient Encounter Medications as of 07/29/2021  ?Medication Sig  ? aspirin EC 81 MG tablet Take 81 mg by mouth daily. Swallow whole.  ? Blood Glucose Monitoring Suppl (ONE TOUCH ULTRA 2) w/Device KIT Test 3x a day  ? buPROPion (WELLBUTRIN XL) 300 MG 24 hr tablet Take 1 tablet (300 mg total) by mouth daily. (Patient not taking: Reported on 04/23/2021)  ? glucose blood (ONETOUCH ULTRA) test strip CHECK GLUCOSE ONCE DAILY  ? glucose blood test strip Use as instructed  ? lisinopril-hydrochlorothiazide (ZESTORETIC) 20-25 MG tablet Take 1 tablet by mouth daily.  ? meclizine (ANTIVERT) 25 MG tablet Take 1 tablet (25 mg total) by mouth 3 (three) times daily as needed for dizziness. (Patient not taking: Reported on 04/23/2021)  ? metFORMIN (GLUCOPHAGE) 1000 MG tablet Take 1 tablet (1,000 mg total) by mouth 2 (two) times daily with a meal.  ? Multiple Vitamin (MULTIVITAMIN) tablet Take 1 tablet by mouth daily.  ? NYSTATIN powder APPLY  POWDER TOPICALLY 4 TIMES DAILY  ? omeprazole (PRILOSEC) 20 MG capsule Take 2 capsules (40 mg total) by mouth daily.  ? ondansetron (ZOFRAN-ODT) 8 MG disintegrating tablet DISSOLVE 1 TABLET IN MOUTH EVERY 8 HOURS AS NEEDED FOR NAUSEA OR VOMITING (Patient not taking: Reported on 04/23/2021)  ? OneTouch Delica Lancets 90X MISC Test 3x a day  ? rosuvastatin (CRESTOR) 10 MG tablet Take 1 tablet (10 mg total) by mouth daily.  ? ?No facility-administered encounter medications on file as of 07/29/2021.  ? ?[facility] for General Review Call ? ? ?Chart  Review: ? ?Have there been any documented new, changed, or discontinued medications since last visit? No (If yes, include name, dose, frequency, date) ? ?Has there been any documented recent hospitalizations or ED visits since last visit with Clinical Pharmacist? No ? ? ? ?Adherence Review: ? ?Does the Clinical Pharmacist Assistant have access to adherence rates? Yes ? ?Adherence rates for STAR metric medications (List medication(s)/day supply/ last 2 fill dates). See note below ? ?Does the patient have >5 day gap between last estimated fill dates for any of the above medications or other medication gaps? Yes ? ? ? ?Disease State Questions: ? ?Able to connect with Patient? Yes ? ?Did patient have any problems with their health recently? No ?Note problems and Concerns:N/a ? ?Have you had any admissions or emergency room visits or worsening of your condition(s) since last visit? No ?Details of ED visit, hospital visit and/or worsening condition(s):N/a ? ?Have you had any visits with new specialists or providers since your last visit? No ?Explain:N/a ? ?Have you had any new health care problem(s) since your last visit? No ?New problem(s) reported:N/a ? ?Have you run out of any of your medications since you last spoke with clinical pharmacist? No ?What caused you to run out of your medications? N/a ? ?Are there any medications you are not taking as prescribed? No ?What kept you from taking your medications as prescribed?N/a ? ?Are you having any issues or side effects with your medications? No ?Note of issues or side effects:N/a ? ?Do you  have any other health concerns or questions you want to discuss with your Clinical Pharmacist before your next visit? No ?Note additional concerns and questions from Patient. N/a ? ?Are there any health concerns that you feel we can do a better job addressing? No ?Note Patient's response. Patient states there is nothing at this time. ? ?Are you having any problems with any of the  following since the last visit: (select all that apply) ? None ? Details:N/a ? ?40. Any falls since last visit? No ? Details:N/a ? ?13. Any increased or uncontrolled pain since last visit? No ? Details:N/a ? ?14. Next visit Type: telephone ?      Visit with:Jacob Potts ?       Date:09/23/21 ?       Time:2:00pm ? ?15. Additional Details? No  ? ? ?Care Gaps: ?Zoster Vaccines:Never done ? ?Star Rating Drugs: ?Lisinopril-hydrochlorothiazide 20-25 mg Last filled:04/06/21 90 DS ?Metformin 1000 mg Last filled:04/11/21 Unknown DS ?Rosuvastatin 10 mg Last filled:04/06/21 90 DS ? ?Corrie Mckusick, RMA ?Health Concierge ? ?

## 2021-09-23 ENCOUNTER — Telehealth: Payer: Medicare Other

## 2021-09-25 ENCOUNTER — Other Ambulatory Visit: Payer: Self-pay | Admitting: Family Medicine

## 2021-09-26 NOTE — Telephone Encounter (Signed)
LVM asking patient to call back to schedule an appointment 

## 2021-09-26 NOTE — Telephone Encounter (Signed)
Patient is overdue for appointment. Was due in April for a 3 month f/up. Please call to schedule visit and then route to provider for refill.

## 2021-09-26 NOTE — Telephone Encounter (Signed)
Requested medication (s) are due for refill today: yes  Requested medication (s) are on the active medication list: yes  Last refill:  03/26/21 #180 1 RF  Future visit scheduled: no  Notes to clinic:  Called pt and LM on VM to call back to schedule appt. Needs OV   Requested Prescriptions  Pending Prescriptions Disp Refills   metFORMIN (GLUCOPHAGE) 1000 MG tablet [Pharmacy Med Name: metFORMIN HCl 1000 MG Oral Tablet] 180 tablet 0    Sig: TAKE 1 TABLET BY MOUTH TWICE DAILY WITH A MEAL     Endocrinology:  Diabetes - Biguanides Failed - 09/25/2021 11:12 AM      Failed - Cr in normal range and within 360 days    Creatinine, Ser  Date Value Ref Range Status  03/26/2021 1.20 (H) 0.57 - 1.00 mg/dL Final         Failed - HBA1C is between 0 and 7.9 and within 180 days    Hemoglobin A1C  Date Value Ref Range Status  11/28/2015 7.5  Final   HB A1C (BAYER DCA - WAIVED)  Date Value Ref Range Status  03/26/2021 7.4 (H) 4.8 - 5.6 % Final    Comment:             Prediabetes: 5.7 - 6.4          Diabetes: >6.4          Glycemic control for adults with diabetes: <7.0          Failed - eGFR in normal range and within 360 days    GFR calc Af Amer  Date Value Ref Range Status  04/04/2020 48 (L) >59 mL/min/1.73 Final    Comment:    **In accordance with recommendations from the NKF-ASN Task force,**   [facility] is in the process of updating its eGFR calculation to the   2021 CKD-EPI creatinine equation that estimates kidney function   without a race variable.    GFR calc non Af Amer  Date Value Ref Range Status  04/04/2020 42 (L) >59 mL/min/1.73 Final   eGFR  Date Value Ref Range Status  03/26/2021 48 (L) >59 mL/min/1.73 Final         Failed - B12 Level in normal range and within 720 days    No results found for: "VITAMINB12"       Failed - Valid encounter within last 6 months    Recent Outpatient Visits           6 months ago Routine general medical examination at a health care  facility   [facility] [place], Carter P, DO   10 months ago Type 2 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 disease, without long-term current use of insulin ([place])   [place], Megan P, DO   1 year ago Type 2 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 disease, without long-term current use of insulin ([place])   [facility], [place], DO   1 year ago Dizziness   [place], [place], DO   1 year ago Dizziness   [place], DO       Future Appointments             In 2 months [place]             Passed - CBC within normal limits and completed in the last 12 months    WBC  Date  Value Ref Range Status  03/26/2021 6.5 3.4 - 10.8 x10E3/uL Final   RBC  Date Value Ref Range Status  03/26/2021 3.40 (L) 3.77 - 5.28 x10E6/uL Final   Hemoglobin  Date Value Ref Range Status  03/26/2021 10.0 (L) 11.1 - 15.9 g/dL Final   Hematocrit  Date Value Ref Range Status  03/26/2021 30.2 (L) 34.0 - 46.6 % Final   MCHC  Date Value Ref Range Status  03/26/2021 33.1 31.5 - 35.7 g/dL Final   [facility]  Date Value Ref Range Status  03/26/2021 29.4 26.6 - 33.0 pg Final   MCV  Date Value Ref Range Status  03/26/2021 89 79 - 97 fL Final   No results found for: "PLTCOUNTKUC", "LABPLAT", "POCPLA" RDW  Date Value Ref Range Status  03/26/2021 12.3 11.7 - 15.4 % Final

## 2021-09-27 NOTE — Telephone Encounter (Signed)
Patient scheduled 7/28, states she thinks she has enough medication to last her until her appointment

## 2021-10-09 ENCOUNTER — Other Ambulatory Visit: Payer: Self-pay | Admitting: Family Medicine

## 2021-10-10 NOTE — Telephone Encounter (Signed)
Requested Prescriptions  Pending Prescriptions Disp Refills  . rosuvastatin (CRESTOR) 10 MG tablet [Pharmacy Med Name: Rosuvastatin Calcium 10 MG Oral Tablet] 90 tablet 0    Sig: Take 1 tablet by mouth once daily     Cardiovascular:  Antilipid - Statins 2 Failed - 10/09/2021  5:13 PM      Failed - Cr in normal range and within 360 days    Creatinine, Ser  Date Value Ref Range Status  03/26/2021 1.20 (H) 0.57 - 1.00 mg/dL Final         Failed - Lipid Panel in normal range within the last 12 months    Cholesterol, Total  Date Value Ref Range Status  03/26/2021 155 100 - 199 mg/dL Final   Cholesterol Piccolo, Waived  Date Value Ref Range Status  10/25/2015 228 (H) <200 mg/dL Final    Comment:                            Desirable                <200                         Borderline High      200- 239                         High                     >239    LDL Chol Calc (NIH)  Date Value Ref Range Status  03/26/2021 76 0 - 99 mg/dL Final   HDL  Date Value Ref Range Status  03/26/2021 58 >39 mg/dL Final   Triglycerides  Date Value Ref Range Status  03/26/2021 121 0 - 149 mg/dL Final   Triglycerides Piccolo,Waived  Date Value Ref Range Status  10/25/2015 162 (H) <150 mg/dL Final    Comment:                            Normal                   <150                         Borderline High     150 - 199                         High                200 - 499                         Very High                >499          Passed - Patient is not pregnant      Passed - Valid encounter within last 12 months    Recent Outpatient Visits          6 months ago Routine general medical examination at a health care facility   [facility], Megan P, DO   11 months ago Type 2 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 disease, without long-term current use of insulin (HCC)   [facility] [place], Megan P, DO   1 year ago Type 2 diabetes mellitus  with stage 2 chronic kidney disease, without long-term current use of insulin (HCC)   [facility] [place], [place], DO   1 year ago Dizziness   [facility] [place], [place], DO   1 year ago Dizziness   [facility] [place], Oralia Rud, DO      Future Appointments            In 1 week [facility] [place], Oralia Rud, DO [facility], [facility]   In 1 month  [facility], [facility]

## 2021-10-18 ENCOUNTER — Encounter: Payer: Self-pay | Admitting: Family Medicine

## 2021-10-18 ENCOUNTER — Ambulatory Visit (INDEPENDENT_AMBULATORY_CARE_PROVIDER_SITE_OTHER): Payer: Medicare Other | Admitting: Family Medicine

## 2021-10-18 VITALS — BP 125/75 | HR 93 | Temp 98.5°F | Wt 147.4 lb

## 2021-10-18 DIAGNOSIS — F3341 Major depressive disorder, recurrent, in partial remission: Secondary | ICD-10-CM

## 2021-10-18 DIAGNOSIS — E782 Mixed hyperlipidemia: Secondary | ICD-10-CM

## 2021-10-18 DIAGNOSIS — N182 Chronic kidney disease, stage 2 (mild): Secondary | ICD-10-CM

## 2021-10-18 DIAGNOSIS — E1122 Type 2 diabetes mellitus with diabetic chronic kidney disease: Secondary | ICD-10-CM | POA: Diagnosis not present

## 2021-10-18 DIAGNOSIS — I129 Hypertensive chronic kidney disease with stage 1 through stage 4 chronic kidney disease, or unspecified chronic kidney disease: Secondary | ICD-10-CM

## 2021-10-18 LAB — BAYER DCA HB A1C WAIVED: HB A1C (BAYER DCA - WAIVED): 6.8 % — ABNORMAL HIGH (ref 4.8–5.6)

## 2021-10-18 MED ORDER — GLUCOSE BLOOD VI STRP: ORAL_STRIP | 12 refills | Status: DC

## 2021-10-18 MED ORDER — ONETOUCH ULTRA 2 W/DEVICE KIT: PACK | 12 refills | Status: DC

## 2021-10-18 MED ORDER — TRULICITY 4.5 MG/0.5ML ~~LOC~~ SOAJ: 4.5000 mg | SUBCUTANEOUS | 1 refills | Status: DC

## 2021-10-18 MED ORDER — ONETOUCH ULTRA VI STRP: ORAL_STRIP | 12 refills | Status: DC

## 2021-10-18 MED ORDER — MECLIZINE HCL 25 MG PO TABS: 25.0000 mg | ORAL_TABLET | Freq: Three times a day (TID) | ORAL | 6 refills | Status: DC | PRN

## 2021-10-18 MED ORDER — BUPROPION HCL ER (XL) 150 MG PO TB24: 150.0000 mg | ORAL_TABLET | Freq: Every day | ORAL | 1 refills | Status: DC

## 2021-10-18 MED ORDER — ONDANSETRON 8 MG PO TBDP: ORAL_TABLET | ORAL | 4 refills | Status: DC

## 2021-10-18 MED ORDER — LISINOPRIL-HYDROCHLOROTHIAZIDE 20-25 MG PO TABS: 1.0000 | ORAL_TABLET | Freq: Every day | ORAL | 1 refills | Status: DC

## 2021-10-18 MED ORDER — ROSUVASTATIN CALCIUM 10 MG PO TABS: 10.0000 mg | ORAL_TABLET | Freq: Every day | ORAL | 0 refills | Status: DC

## 2021-10-18 MED ORDER — OMEPRAZOLE 20 MG PO CPDR: 40.0000 mg | DELAYED_RELEASE_CAPSULE | Freq: Every day | ORAL | 1 refills | Status: DC

## 2021-10-18 MED ORDER — METFORMIN HCL 1000 MG PO TABS: 1000.0000 mg | ORAL_TABLET | Freq: Two times a day (BID) | ORAL | 1 refills | Status: DC

## 2021-10-18 NOTE — Assessment & Plan Note (Signed)
Stopped her wellbutrin. Not doing well. Restart wellbutrin. Call with any concerns. Continue to monitor.

## 2021-10-18 NOTE — Assessment & Plan Note (Signed)
Under good control on current regimen. Continue current regimen. Continue to monitor. Call with any concerns. Refills given. Labs drawn today.   

## 2021-10-18 NOTE — Progress Notes (Signed)
BP 125/75   Pulse 93   Temp 98.5 F (36.9 C)   Wt 147 lb 6.4 oz (66.9 kg)   SpO2 97%   BMI 23.79 kg/m    Subjective:    Patient ID: Alison Hernandez, female    DOB: 1945/06/21, 76 y.o.   MRN: [id number]  HPI: Alison Hernandez is a 76 y.o. female  Chief Complaint  Patient presents with   Diabetes   Hypertension   Depression    Patient states she has not been on Wellbutrin for months and is considering going back on it.    Hyperlipidemia   DIABETES Hypoglycemic episodes:no Polydipsia/polyuria: no Visual disturbance: no Chest pain: no Paresthesias: no Glucose Monitoring: yes  Accucheck frequency: Daily Taking Insulin?: no Blood Pressure Monitoring: not checking Retinal Examination: Up to Date Foot Exam: Up to Date Diabetic Education: Completed Pneumovax: Up to Date Influenza: Up to Date Aspirin: yes  HYPERTENSION / HYPERLIPIDEMIA Satisfied with current treatment? yes Duration of hypertension: chronic BP monitoring frequency: not checking BP medication side effects: no Past BP meds: lisinopril-HCTZ Duration of hyperlipidemia: chronic Cholesterol medication side effects: no Cholesterol supplements: none Past cholesterol medications: crestor Medication compliance: excellent compliance Aspirin: yes Recent stressors: no Recurrent headaches: no Visual changes: no Palpitations: no Dyspnea: no Chest pain: no Lower extremity edema: no Dizzy/lightheaded: no  DEPRESSION Mood status: uncontrolled Satisfied with current treatment?: no Symptom severity: mild  Duration of current treatment : chronic Side effects: no Medication compliance: poor compliance Psychotherapy/counseling: no  Previous psychiatric medications: wellbutrin Depressed mood: yes Anxious mood: yes Anhedonia: no Significant weight loss or gain: no Insomnia: no  Fatigue: no Feelings of worthlessness or guilt: no Impaired concentration/indecisiveness: no Suicidal ideations: no Hopelessness:  no Crying spells: no    10/18/2021    9:58 AM 03/26/2021    2:12 PM 12/03/2020    1:52 PM 10/26/2020   11:40 AM 08/02/2020    3:40 PM  Depression screen PHQ 2/9  Decreased Interest 0 0 0 0 0  Down, Depressed, Hopeless 0 0 0 0 0  PHQ - 2 Score 0 0 0 0 0  Altered sleeping 0 0   0  Tired, decreased energy 0 0   0  Change in appetite 0 0   0  Feeling bad or failure about yourself  0 0   0  Trouble concentrating 0 0   0  Moving slowly or fidgety/restless 0 0   0  Suicidal thoughts 0 0   0  PHQ-9 Score 0 0   0  Difficult doing work/chores Not difficult at all          10/18/2021    9:59 AM 03/26/2021    2:12 PM 01/17/2020   10:59 AM 04/25/2015    9:19 AM  GAD 7 : Generalized Anxiety Score  Nervous, Anxious, on Edge 0 0 0 0  Control/stop worrying 0 0 0 0  Worry too much - different things 0 0 0 0  Trouble relaxing 0 0 0 0  Restless 0 0 0 0  Easily annoyed or irritable 0 0 0 0  Afraid - awful might happen 0 0 0 0  Total GAD 7 Score 0 0 0 0  Anxiety Difficulty   Not difficult at all Somewhat difficult     Relevant past medical, surgical, family and social history reviewed and updated as indicated. Interim medical history since our last visit reviewed. Allergies and medications reviewed and updated.  Review of Systems  Constitutional: Negative.   Respiratory: Negative.    Cardiovascular: Negative.   Gastrointestinal: Negative.   Musculoskeletal: Negative.   Skin: Negative.   Neurological: Negative.   Psychiatric/Behavioral:  Positive for dysphoric mood. Negative for agitation, behavioral problems, confusion, decreased concentration, hallucinations, self-injury, sleep disturbance and suicidal ideas. The patient is nervous/anxious. The patient is not hyperactive.     Per HPI unless specifically indicated above     Objective:    BP 125/75   Pulse 93   Temp 98.5 F (36.9 C)   Wt 147 lb 6.4 oz (66.9 kg)   SpO2 97%   BMI 23.79 kg/m   Wt Readings from Last 3 Encounters:   10/18/21 147 lb 6.4 oz (66.9 kg)  05/21/21 150 lb (68 kg)  05/07/21 150 lb (68 kg)    Physical Exam Vitals and nursing note reviewed.  Constitutional:      General: She is not in acute distress.    Appearance: Normal appearance. She is normal weight. She is not ill-appearing, toxic-appearing or diaphoretic.  HENT:     Head: Normocephalic and atraumatic.     Right Ear: External ear normal.     Left Ear: External ear normal.     Nose: Nose normal.     Mouth/Throat:     Mouth: Mucous membranes are moist.     Pharynx: Oropharynx is clear.  Eyes:     General: No scleral icterus.       Right eye: No discharge.        Left eye: No discharge.     Extraocular Movements: Extraocular movements intact.     Conjunctiva/sclera: Conjunctivae normal.     Pupils: Pupils are equal, round, and reactive to light.  Cardiovascular:     Rate and Rhythm: Normal rate and regular rhythm.     Pulses: Normal pulses.     Heart sounds: Normal heart sounds. No murmur heard.    No friction rub. No gallop.  Pulmonary:     Effort: Pulmonary effort is normal. No respiratory distress.     Breath sounds: Normal breath sounds. No stridor. No wheezing, rhonchi or rales.  Chest:     Chest wall: No tenderness.  Musculoskeletal:        General: Normal range of motion.     Cervical back: Normal range of motion and neck supple.  Skin:    General: Skin is warm and dry.     Capillary Refill: Capillary refill takes less than 2 seconds.     Coloration: Skin is not jaundiced or pale.     Findings: No bruising, erythema, lesion or rash.  Neurological:     General: No focal deficit present.     Mental Status: She is alert and oriented to person, place, and time. Mental status is at baseline.  Psychiatric:        Mood and Affect: Mood normal.        Behavior: Behavior normal.        Thought Content: Thought content normal.        Judgment: Judgment normal.     Results for orders placed or performed in visit on  10/18/21  Bayer DCA Hb A1c Waived  Result Value Ref Range   HB A1C (BAYER DCA - WAIVED) 6.8 (H) 4.8 - 5.6 %      Assessment & Plan:   Problem List Items Addressed This Visit       Endocrine   Diabetes (HCC) - Primary    Doing  well with A1c of 6.8. Continue current regimen. In the donut hole- needs help paying for trulicity. Referral to CCM made. Call with any concerns. Continue to monitor.       Relevant Medications   metFORMIN (GLUCOPHAGE) 1000 MG tablet   rosuvastatin (CRESTOR) 10 MG tablet   TRULICITY 4.5 MG/0.5ML SOPN   lisinopril-hydrochlorothiazide (ZESTORETIC) 20-25 MG tablet   Other Relevant Orders   Comprehensive metabolic panel   CBC with Differential/Platelet   Bayer DCA Hb A1c Waived (Completed)   AMB Referral to [facility]     Genitourinary   Benign hypertensive renal disease    Under good control on current regimen. Continue current regimen. Continue to monitor. Call with any concerns. Refills given. Labs drawn today.        Relevant Orders   Comprehensive metabolic panel   CBC with Differential/Platelet     Other   Hyperlipidemia    Under good control on current regimen. Continue current regimen. Continue to monitor. Call with any concerns. Refills given. Labs drawn today.       Relevant Medications   rosuvastatin (CRESTOR) 10 MG tablet   lisinopril-hydrochlorothiazide (ZESTORETIC) 20-25 MG tablet   Other Relevant Orders   Comprehensive metabolic panel   CBC with Differential/Platelet   Lipid Panel w/o Chol/HDL Ratio   Depression    Stopped her wellbutrin. Not doing well. Restart wellbutrin. Call with any concerns. Continue to monitor.       Relevant Medications   buPROPion (WELLBUTRIN XL) 150 MG 24 hr tablet   Other Relevant Orders   Comprehensive metabolic panel   CBC with Differential/Platelet     Follow up plan: Return in about 3 months (around 01/18/2022).

## 2021-10-18 NOTE — Assessment & Plan Note (Signed)
Doing well with A1c of 6.8. Continue current regimen. In the donut hole- needs help paying for trulicity. Referral to CCM made. Call with any concerns. Continue to monitor.

## 2021-10-19 LAB — COMPREHENSIVE METABOLIC PANEL
ALT: 13 IU/L (ref 0–32)
AST: 14 IU/L (ref 0–40)
Albumin/Globulin Ratio: 1.9 (ref 1.2–2.2)
Albumin: 4.5 g/dL (ref 3.8–4.8)
Alkaline Phosphatase: 70 IU/L (ref 44–121)
BUN/Creatinine Ratio: 22 (ref 12–28)
BUN: 25 mg/dL (ref 8–27)
Bilirubin Total: 0.5 mg/dL (ref 0.0–1.2)
CO2: 23 mmol/L (ref 20–29)
Calcium: 9.7 mg/dL (ref 8.7–10.3)
Chloride: 101 mmol/L (ref 96–106)
Creatinine, Ser: 1.12 mg/dL — ABNORMAL HIGH (ref 0.57–1.00)
Globulin, Total: 2.4 g/dL (ref 1.5–4.5)
Glucose: 132 mg/dL — ABNORMAL HIGH (ref 70–99)
Potassium: 4.6 mmol/L (ref 3.5–5.2)
Sodium: 137 mmol/L (ref 134–144)
Total Protein: 6.9 g/dL (ref 6.0–8.5)
eGFR: 52 mL/min/{1.73_m2} — ABNORMAL LOW (ref >59)

## 2021-10-19 LAB — CBC WITH DIFFERENTIAL/PLATELET
Basophils Absolute: 0 10*3/uL (ref 0.0–0.2)
Basos: 0 %
EOS (ABSOLUTE): 0.1 10*3/uL (ref 0.0–0.4)
Eos: 2 %
Hematocrit: 32.8 % — ABNORMAL LOW (ref 34.0–46.6)
Hemoglobin: 10.9 g/dL — ABNORMAL LOW (ref 11.1–15.9)
Immature Grans (Abs): 0 10*3/uL (ref 0.0–0.1)
Immature Granulocytes: 0 %
Lymphocytes Absolute: 1.9 10*3/uL (ref 0.7–3.1)
Lymphs: 31 %
MCH: 30.3 pg (ref 26.6–33.0)
MCHC: 33.2 g/dL (ref 31.5–35.7)
MCV: 91 fL (ref 79–97)
Monocytes Absolute: 0.4 10*3/uL (ref 0.1–0.9)
Monocytes: 6 %
Neutrophils Absolute: 3.7 10*3/uL (ref 1.4–7.0)
Neutrophils: 61 %
Platelets: 223 10*3/uL (ref 150–450)
RBC: 3.6 x10E6/uL — ABNORMAL LOW (ref 3.77–5.28)
RDW: 12.9 % (ref 11.7–15.4)
WBC: 6.1 10*3/uL (ref 3.4–10.8)

## 2021-10-19 LAB — LIPID PANEL W/O CHOL/HDL RATIO
Cholesterol, Total: 162 mg/dL (ref 100–199)
HDL: 57 mg/dL (ref >39)
LDL Chol Calc (NIH): 79 mg/dL (ref 0–99)
Triglycerides: 149 mg/dL (ref 0–149)
VLDL Cholesterol Cal: 26 mg/dL (ref 5–40)

## 2021-10-23 ENCOUNTER — Telehealth: Payer: Self-pay

## 2021-10-23 NOTE — Telephone Encounter (Signed)
Received msg that patient is paying $700 for medication. Looks like PAP was filled out in Feb but patient missed appt in May. Will have Concierge reach out and look into PAP ASAP

## 2021-10-30 NOTE — Progress Notes (Signed)
I have contacted the patient about the application that was sent out to her for her Trulicity back in February. She stated she did receive it but did not complete it due to the medication being $45 back then that is why she didn't continue the process. She has received the medication from [facility] before so she could need a renewal application sent in. I mentioned to the patient that I will be mailing out the application to her tomorrow so she can receive it through the mail and once she signs her portion she will need to bring it back to office so her PCP can sign and date and fax everything to [facility]. Patient understood.  Rance Muir, RMA Health Concierge

## 2021-11-05 ENCOUNTER — Encounter: Payer: Self-pay | Admitting: Nurse Practitioner

## 2021-11-05 ENCOUNTER — Ambulatory Visit (INDEPENDENT_AMBULATORY_CARE_PROVIDER_SITE_OTHER): Payer: Medicare Other | Admitting: Nurse Practitioner

## 2021-11-05 VITALS — BP 135/71 | HR 77 | Temp 98.0°F | Wt 150.7 lb

## 2021-11-05 DIAGNOSIS — R8281 Pyuria: Secondary | ICD-10-CM

## 2021-11-05 DIAGNOSIS — R399 Unspecified symptoms and signs involving the genitourinary system: Secondary | ICD-10-CM | POA: Insufficient documentation

## 2021-11-05 LAB — URINALYSIS, ROUTINE W REFLEX MICROSCOPIC
Bilirubin, UA: NEGATIVE
Glucose, UA: NEGATIVE
Nitrite, UA: NEGATIVE
Specific Gravity, UA: 1.025 (ref 1.005–1.030)
Urobilinogen, Ur: 1 mg/dL (ref 0.2–1.0)
pH, UA: 5.5 (ref 5.0–7.5)

## 2021-11-05 LAB — MICROSCOPIC EXAMINATION: RBC, Urine: NONE SEEN /hpf (ref 0–2)

## 2021-11-05 MED ORDER — AMOXICILLIN-POT CLAVULANATE 875-125 MG PO TABS: 1.0000 | ORAL_TABLET | Freq: Two times a day (BID) | ORAL | 0 refills | Status: AC

## 2021-11-05 MED ORDER — FLUCONAZOLE 150 MG PO TABS: 150.0000 mg | ORAL_TABLET | Freq: Once | ORAL | 0 refills | Status: AC

## 2021-11-05 NOTE — Assessment & Plan Note (Signed)
Acute for 3 days -- UA noting many bacteria, LEU 1+, PRO 1+, BLD trace.  At this time will send for culture.  Start Augmentin BID for 5 days and adjust as needed based on culture results + one dose Diflucan for pruritus.  Ensure plenty of water intake at home and consider a cranberry tablet daily.  Return for worsening or ongoing.

## 2021-11-05 NOTE — Progress Notes (Signed)
Acute Office Visit  Subjective:     Patient ID: Alison Hernandez, female    DOB: Dec 13, 1945, 76 y.o.   MRN: [id number]  Chief Complaint  Patient presents with   Urinary Tract Infection    Pt states she has been having some pain and burning with urination since Saturday.     Started with burning on urination on Saturday and some pruritus.    Urinary Tract Infection  This is a new problem. The current episode started in the past 7 days. The problem occurs intermittently. The problem has been unchanged. The quality of the pain is described as burning. The pain is at a severity of 8/10. The pain is moderate. There has been no fever. She is Not sexually active. There is No history of pyelonephritis. Associated symptoms include frequency, hesitancy and urgency. Pertinent negatives include no chills, discharge, flank pain, hematuria, nausea, sweats or vomiting. She has tried increased fluids for the symptoms. The treatment provided mild relief. There is no history of kidney stones, recurrent UTIs or a urological procedure.   Patient is in today for urinary symptoms.  Review of Systems  Constitutional:  Negative for chills.  Respiratory:  Negative for cough, shortness of breath and wheezing.   Cardiovascular:  Negative for chest pain, orthopnea and leg swelling.  Gastrointestinal:  Negative for abdominal pain, nausea and vomiting.  Genitourinary:  Positive for dysuria, frequency, hesitancy and urgency. Negative for flank pain and hematuria.  Musculoskeletal:  Negative for back pain.  Neurological: Negative.   Psychiatric/Behavioral: Negative.        Objective:    BP 135/71 (BP Location: Left Arm, Cuff Size: Normal)   Pulse 77   Temp 98 F (36.7 C) (Oral)   Wt 150 lb 11.2 oz (68.4 kg)   SpO2 97%   BMI 24.32 kg/m  BP Readings from Last 3 Encounters:  11/05/21 135/71  10/18/21 125/75  05/21/21 (!) 158/85   Wt Readings from Last 3 Encounters:  11/05/21 150 lb 11.2 oz (68.4 kg)   10/18/21 147 lb 6.4 oz (66.9 kg)  05/21/21 150 lb (68 kg)   Physical Exam Vitals and nursing note reviewed.  Constitutional:      General: She is awake. She is not in acute distress.    Appearance: She is well-developed and well-groomed. She is not ill-appearing or toxic-appearing.  HENT:     Head: Normocephalic.     Right Ear: Hearing normal.     Left Ear: Hearing normal.  Eyes:     General: Lids are normal.        Right eye: No discharge.        Left eye: No discharge.     Conjunctiva/sclera: Conjunctivae normal.     Pupils: Pupils are equal, round, and reactive to light.  Neck:     Thyroid: No thyromegaly.     Vascular: No carotid bruit.  Cardiovascular:     Rate and Rhythm: Normal rate and regular rhythm.     Heart sounds: Normal heart sounds. No murmur heard.    No gallop.  Pulmonary:     Effort: Pulmonary effort is normal. No accessory muscle usage or respiratory distress.     Breath sounds: Normal breath sounds.  Abdominal:     General: Bowel sounds are normal. There is no distension.     Palpations: Abdomen is soft.     Tenderness: There is no abdominal tenderness. There is no right CVA tenderness or left CVA tenderness.  Musculoskeletal:  Cervical back: Normal range of motion and neck supple.     Right lower leg: No edema.     Left lower leg: No edema.  Lymphadenopathy:     Cervical: No cervical adenopathy.  Skin:    General: Skin is warm and dry.  Neurological:     Mental Status: She is alert and oriented to person, place, and time.  Psychiatric:        Attention and Perception: Attention normal.        Mood and Affect: Mood normal.        Speech: Speech normal.        Behavior: Behavior normal. Behavior is cooperative.        Thought Content: Thought content normal.    No results found for any visits on 11/05/21.     Assessment & Plan:   Problem List Items Addressed This Visit       Other   Urinary symptom or sign - Primary    Acute for 3  days -- UA noting many bacteria, LEU 1+, PRO 1+, BLD trace.  At this time will send for culture.  Start Augmentin BID for 5 days and adjust as needed based on culture results + one dose Diflucan for pruritus.  Ensure plenty of water intake at home and consider a cranberry tablet daily.  Return for worsening or ongoing.      Relevant Orders   Urinalysis, Routine w reflex microscopic   Other Visit Diagnoses     Pyuria       Urine culture sent today.   Relevant Orders   Urine Culture       Meds ordered this encounter  Medications   amoxicillin-clavulanate (AUGMENTIN) 875-125 MG tablet    Sig: Take 1 tablet by mouth 2 (two) times daily for 5 days.    Dispense:  10 tablet    Refill:  0   fluconazole (DIFLUCAN) 150 MG tablet    Sig: Take 1 tablet (150 mg total) by mouth once for 1 dose.    Dispense:  1 tablet    Refill:  0    Return if symptoms worsen or fail to improve.  Marjie Skiff, NP

## 2021-11-05 NOTE — Patient Instructions (Signed)
Urinary Tract Infection, Adult A urinary tract infection (UTI) is an infection of any part of the urinary tract. The urinary tract includes: The kidneys. The ureters. The bladder. The urethra. These organs make, store, and get rid of pee (urine) in the body. What are the causes? This infection is caused by germs (bacteria) in your genital area. These germs grow and cause swelling (inflammation) of your urinary tract. What increases the risk? The following factors may make you more likely to develop this condition: Using a small, thin tube (catheter) to drain pee. Not being able to control when you pee or poop (incontinence). Being female. If you are female, these things can increase the risk: Using these methods to prevent pregnancy: A medicine that kills sperm (spermicide). A device that blocks sperm (diaphragm). Having low levels of a female hormone (estrogen). Being pregnant. You are more likely to develop this condition if: You have genes that add to your risk. You are sexually active. You take antibiotic medicines. You have trouble peeing because of: A prostate that is bigger than normal, if you are female. A blockage in the part of your body that drains pee from the bladder. A kidney stone. A nerve condition that affects your bladder. Not getting enough to drink. Not peeing often enough. You have other conditions, such as: Diabetes. A weak disease-fighting system (immune system). Sickle cell disease. Gout. Injury of the spine. What are the signs or symptoms? Symptoms of this condition include: Needing to pee right away. Peeing small amounts often. Pain or burning when peeing. Blood in the pee. Pee that smells bad or not like normal. Trouble peeing. Pee that is cloudy. Fluid coming from the vagina, if you are female. Pain in the belly or lower back. Other symptoms include: Vomiting. Not feeling hungry. Feeling mixed up (confused). This may be the first symptom in  older adults. Being tired and grouchy (irritable). A fever. Watery poop (diarrhea). How is this treated? Taking antibiotic medicine. Taking other medicines. Drinking enough water. In some cases, you may need to see a specialist. Follow these instructions at home:  Medicines Take over-the-counter and prescription medicines only as told by your doctor. If you were prescribed an antibiotic medicine, take it as told by your doctor. Do not stop taking it even if you start to feel better. General instructions Make sure you: Pee until your bladder is empty. Do not hold pee for a long time. Empty your bladder after sex. Wipe from front to back after peeing or pooping if you are a female. Use each tissue one time when you wipe. Drink enough fluid to keep your pee pale yellow. Keep all follow-up visits. Contact a doctor if: You do not get better after 1-2 days. Your symptoms go away and then come back. Get help right away if: You have very bad back pain. You have very bad pain in your lower belly. You have a fever. You have chills. You feeling like you will vomit or you vomit. Summary A urinary tract infection (UTI) is an infection of any part of the urinary tract. This condition is caused by germs in your genital area. There are many risk factors for a UTI. Treatment includes antibiotic medicines. Drink enough fluid to keep your pee pale yellow. This information is not intended to replace advice given to you by your health care provider. Make sure you discuss any questions you have with your health care provider. Document Revised: 10/21/2019 Document Reviewed: 10/21/2019 Elsevier Patient Education    2023 [employer]  

## 2021-11-07 LAB — URINE CULTURE

## 2021-11-07 NOTE — Progress Notes (Signed)
Contacted via MyChart   Good afternoon, your urine culture did return showing growth of bacteria > 100,000 and it is susceptible to Augmentin which we placed you on.  If any worsening or ongoing symptoms return to office:)

## 2021-11-14 ENCOUNTER — Telehealth: Payer: Self-pay | Admitting: Family Medicine

## 2021-11-14 NOTE — Telephone Encounter (Signed)
Pts daughter dropped off [employer] application to be signed by provider.  Will place in provider's folder.

## 2021-11-15 NOTE — Telephone Encounter (Signed)
Noted, Pharmacy team notified

## 2021-11-15 NOTE — Telephone Encounter (Signed)
Forms have been signed by provider and placed in designated folder.

## 2021-11-18 ENCOUNTER — Ambulatory Visit (INDEPENDENT_AMBULATORY_CARE_PROVIDER_SITE_OTHER): Payer: Medicare Other

## 2021-11-18 NOTE — Progress Notes (Signed)
Chronic Care Management Pharmacy Note  11/18/2021 Name:  Alison Hernandez MRN:  [id number] DOB:  Oct 09, 1945  Summary: -Pleasant 76 year old female presents for f/u CCM visit  Recommendations/Changes made from today's visit: -Patient candidate for increasing statin to high therapy -Onboard to [facility] -Already coordinated with PCP about temporary samples of Mounjaro to last until Trulicity PAP approved   Subjective: Alison Hernandez is an 76 y.o. year old female who is a primary patient of Valerie Roys, DO.  The CCM team was consulted for assistance with disease management and care coordination needs.    Engaged with patient by telephone for follow up visit in response to provider referral for pharmacy case management and/or care coordination services.   Consent to Services:  The patient was given the following information about Chronic Care Management services today, agreed to services, and gave verbal consent: 1. CCM service includes personalized support from designated clinical staff supervised by the primary care provider, including individualized plan of care and coordination with other care providers 2. 24/7 contact phone numbers for assistance for urgent and routine care needs. 3. Service will only be billed when office clinical staff spend 20 minutes or more in a month to coordinate care. 4. Only one practitioner may furnish and bill the service in a calendar month. 5.The patient may stop CCM services at any time (effective at the end of the month) by phone call to the office staff. 6. The patient will be responsible for cost sharing (co-pay) of up to 20% of the service fee (after annual deductible is met). Patient agreed to services and consent obtained.  Patient Care Team: Valerie Roys, DO as PCP - General (Family Medicine) Tania Ade, MD as Consulting Physician (Orthopedic Surgery) Garrel Ridgel, DPM as Consulting Physician (Podiatry) Lane Hacker, [facility]  (Pharmacist)    Objective:  Lab Results  Component Value Date   CREATININE 1.12 (H) 10/18/2021   BUN 25 10/18/2021   EGFR 52 (L) 10/18/2021   GFRNONAA 42 (L) 04/04/2020   GFRAA 48 (L) 04/04/2020   NA 137 10/18/2021   K 4.6 10/18/2021   CALCIUM 9.7 10/18/2021   CO2 23 10/18/2021   GLUCOSE 132 (H) 10/18/2021    Lab Results  Component Value Date/Time   HGBA1C 6.8 (H) 10/18/2021 09:59 AM   HGBA1C 7.4 (H) 03/26/2021 02:12 PM   HGBA1C 7.5 11/28/2015 12:00 AM   MICROALBUR 80 (H) 03/26/2021 02:12 PM   MICROALBUR 30 (H) 05/19/2019 11:07 AM    Last diabetic Eye exam:  Lab Results  Component Value Date/Time   HMDIABEYEEXA No Retinopathy 04/11/2021 12:00 AM    Last diabetic Foot exam: No results found for: "HMDIABFOOTEX"   Lab Results  Component Value Date   CHOL 162 10/18/2021   HDL 57 10/18/2021   LDLCALC 79 10/18/2021   TRIG 149 10/18/2021       Latest Ref Rng & Units 10/18/2021   10:01 AM 03/26/2021    2:16 PM 08/02/2020    3:52 PM  Hepatic Function  Total Protein 6.0 - 8.5 g/dL 6.9  6.4  6.6   Albumin 3.8 - 4.8 g/dL 4.5  4.3  4.3   AST 0 - 40 IU/L 14  16  11    ALT 0 - 32 IU/L 13  14  8    Alk Phosphatase 44 - 121 IU/L 70  72  78   Total Bilirubin 0.0 - 1.2 mg/dL 0.5  0.4  0.3  Lab Results  Component Value Date/Time   TSH 3.310 03/26/2021 02:16 PM   TSH 3.130 03/21/2020 11:17 AM       Latest Ref Rng & Units 10/18/2021   10:01 AM 03/26/2021    2:16 PM 04/04/2020    8:37 AM  CBC  WBC 3.4 - 10.8 x10E3/uL 6.1  6.5  7.1   Hemoglobin 11.1 - 15.9 g/dL 10.9  10.0  11.4   Hematocrit 34.0 - 46.6 % 32.8  30.2  33.3   Platelets 150 - 450 x10E3/uL 223  233  308     No results found for: "VD25OH"  Clinical ASCVD: Yes  The 10-year ASCVD risk score (Arnett DK, et al., 2019) is: 35.7%   Values used to calculate the score:     Age: 76 years     Sex: Female     Is Non-Hispanic African American: No     Diabetic: Yes     Tobacco smoker: No     Systolic Blood  Pressure: 135 mmHg     Is BP treated: Yes     HDL Cholesterol: 57 mg/dL     Total Cholesterol: 162 mg/dL       11/05/2021    4:09 PM 10/18/2021    9:58 AM 03/26/2021    2:12 PM  Depression screen PHQ 2/9  Decreased Interest 0 0 0  Down, Depressed, Hopeless 0 0 0  PHQ - 2 Score 0 0 0  Altered sleeping 0 0 0  Tired, decreased energy 0 0 0  Change in appetite 0 0 0  Feeling bad or failure about yourself  0 0 0  Trouble concentrating 0 0 0  Moving slowly or fidgety/restless 0 0 0  Suicidal thoughts 0 0 0  PHQ-9 Score 0 0 0  Difficult doing work/chores Not difficult at all Not difficult at all      Other: (CHADS2VASc if Afib, MMRC or CAT for COPD, ACT, DEXA)  Social History   Tobacco Use  Smoking Status Never  Smokeless Tobacco Never   BP Readings from Last 3 Encounters:  11/05/21 135/71  10/18/21 125/75  05/21/21 (!) 158/85   Pulse Readings from Last 3 Encounters:  11/05/21 77  10/18/21 93  05/21/21 91   Wt Readings from Last 3 Encounters:  11/05/21 150 lb 11.2 oz (68.4 kg)  10/18/21 147 lb 6.4 oz (66.9 kg)  05/21/21 150 lb (68 kg)   BMI Readings from Last 3 Encounters:  11/05/21 24.32 kg/m  10/18/21 23.79 kg/m  05/21/21 24.21 kg/m    Assessment/Interventions: Review of patient past medical history, allergies, medications, health status, including review of consultants reports, laboratory and other test data, was performed as part of comprehensive evaluation and provision of chronic care management services.   SDOH:  (Social Determinants of Health) assessments and interventions performed: Yes SDOH Interventions    Flowsheet Row Most Recent Value  SDOH Interventions   Financial Strain Interventions Other (Comment)  [PAP and samples]  Transportation Interventions Intervention Not Indicated      SDOH Screenings   Alcohol Screen: Low Risk  (10/26/2020)   Alcohol Screen    Last Alcohol Screening Score (AUDIT): 0  Depression (PHQ2-9): Low Risk  (11/05/2021)    Depression (PHQ2-9)    PHQ-2 Score: 0  Financial Resource Strain: High Risk (11/18/2021)   Overall Financial Resource Strain (CARDIA)    Difficulty of Paying Living Expenses: Hard  Food Insecurity: No Food Insecurity (12/03/2020)   Hunger Vital Sign  Worried About Charity fundraiser in the Last Year: Never true    [place] in the Last Year: Never true  Housing: Low Risk  (10/26/2020)   Housing    Last Housing Risk Score: 0  Physical Activity: Inactive (12/03/2020)   Exercise Vital Sign    Days of Exercise per Week: 0 days    Minutes of Exercise per Session: 0 min  Social Connections: Unknown (10/26/2020)   Social Connection and Isolation Panel [NHANES]    Frequency of Communication with Friends and Family: More than three times a week    Frequency of Social Gatherings with Friends and Family: More than three times a week    Attends Religious Services: Not on file    Active Member of Clubs or Organizations: Not on file    Attends Archivist Meetings: Not on file    Marital Status: Not on file  Stress: No Stress Concern Present (12/03/2020)   [place] of Stress : Not at all  Tobacco Use: Low Risk  (11/05/2021)   Patient History    Smoking Tobacco Use: Never    Smokeless Tobacco Use: Never    Passive Exposure: Not on file  Transportation Needs: No Transportation Needs (11/18/2021)   PRAPARE - Transportation    Lack of Transportation (Medical): No    Lack of Transportation (Non-Medical): No    CCM Care Plan  Allergies  Allergen Reactions   Lovastatin Nausea And Vomiting   Bydureon [Exenatide] Rash   Losartan Rash    Medications Reviewed Today     Reviewed by Lane Hacker, [facility] (Pharmacist) on 11/18/21 at 1226  Med List Status: <None>   Medication Order Taking? Sig Documenting Provider Last Dose Status Informant  aspirin EC 81 MG tablet [id number] No Take 81 mg by mouth daily.  Swallow whole. [provider] Taking Active   Blood Glucose Monitoring Suppl (ONE TOUCH ULTRA 2) w/Device KIT [id number] No Test 3x a day Johnson, Megan P, DO Taking Active   buPROPion (WELLBUTRIN XL) 150 MG 24 hr tablet [id number] No Take 1 tablet (150 mg total) by mouth daily. [place] Liter P, DO Taking Active   glucose blood (ONETOUCH ULTRA) test strip [id number] No CHECK GLUCOSE ONCE DAILY Valerie Roys, DO Taking Active   glucose blood test strip [id number] No Use as instructed Valerie Roys, DO Taking Active   lisinopril-hydrochlorothiazide (ZESTORETIC) 20-25 MG tablet [id number] No Take 1 tablet by mouth daily. Johnson, Megan P, DO Taking Active   meclizine (ANTIVERT) 25 MG tablet [id number] No Take 1 tablet (25 mg total) by mouth 3 (three) times daily as needed for dizziness. [place] Liter P, DO Taking Active   metFORMIN (GLUCOPHAGE) 1000 MG tablet [id number] No Take 1 tablet (1,000 mg total) by mouth 2 (two) times daily with a meal. Wynetta Emery, Megan P, DO Taking Active   Multiple Vitamin (MULTIVITAMIN) tablet [id number] No Take 1 tablet by mouth daily. [provider] Taking Active   NYSTATIN powder [id number] No APPLY  POWDER TOPICALLY 4 TIMES DAILY Johnson, Megan P, DO Taking Active   omeprazole (PRILOSEC) 20 MG capsule [id number] No Take 2 capsules (40 mg total) by mouth daily. Johnson, Megan P, DO Taking Active   ondansetron (ZOFRAN-ODT) 8 MG disintegrating tablet [id number] No DISSOLVE 1 TABLET IN MOUTH EVERY 8 HOURS AS NEEDED FOR NAUSEA OR VOMITING Johnson, Megan P, DO Taking Active   OneTouch Delica Lancets  33G MISC [id number] No Test 3x a day Johnson, Megan P, DO Taking Active   rosuvastatin (CRESTOR) 10 MG tablet [id number] No Take 1 tablet (10 mg total) by mouth daily. [place] Liter P, DO Taking Active   TRULICITY 4.5 ZL/9.3TT SOPN [id number] No Inject 4.5 mg into the skin once a week. 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   Hyperlipidemia 03/28/2015   Depression 03/28/2015    Immunization History  Administered Date(s) Administered   Fluad Quad(high Dose 65+) 11/18/2018, 01/17/2020, 12/17/2020   Influenza, High Dose Seasonal PF 11/28/2015, 04/08/2017, 04/07/2018   Influenza,inj,Quad PF,6+ Mos 03/28/2015   PFIZER(Purple Top)SARS-COV-2 Vaccination 06/02/2019, 06/28/2019   Pneumococcal Conjugate-13 08/29/2015   Pneumococcal Polysaccharide-23 03/24/1997, 11/06/2016   Td 05/12/2008, 11/18/2018    Conditions to be addressed/monitored:  Hypertension, Hyperlipidemia, and Diabetes  Care Plan : [place]  Updates made by Lane Hacker, [place] since 11/18/2021 12:00 AM     Problem: DM2 uncontrolled, CKD, HTN, HLD, Depression   Priority: High     Long-Range Goal: Disease Management   Start Date: 08/29/2020  Recent Progress: On track  Priority: High  Note:   Current Barriers:  Unable to independently afford treatment regimen  Pharmacist Clinical Goal(s):  Patient will contact provider office for questions/concerns as evidenced notation of same in electronic health record through collaboration with PharmD and provider.   Interventions: 1:1 collaboration with Valerie Roys, DO regarding development and update of comprehensive plan of care as evidenced by provider attestation and co-signature Inter-disciplinary care team collaboration (see longitudinal plan of care) Comprehensive medication review performed; medication list updated in electronic medical record  Hypertension (BP goal <140/90) BP Readings from Last 3 Encounters:  11/05/21 135/71  10/18/21 125/75  05/21/21 (!) 158/85  Controlled -Current treatment: Lisinopril/HCTZ 20/25 Appropriate, Effective, Safe,  Accessible -Medications previously tried: N/A  -Current home readings: Doesn't test -Current dietary habits: "Tries to eat healthy" -Current exercise habits: Works part-time -Denies hypotensive/hypertensive symptoms -Educated on BP goals and benefits of medications for prevention of heart attack, stroke and kidney damage; -Counseled to monitor BP at home prn, document, and provide log at future appointments -Recommended to continue current medication  Hyperlipidemia: (LDL goal < 70) The 10-year ASCVD risk score (Arnett DK, et al., 2019) is: 35.7%   Values used to calculate the score:     Age: 16 years     Sex: Female     Is Non-Hispanic African American: No     Diabetic: Yes     Tobacco smoker: No     Systolic Blood Pressure: 009 mmHg     Is BP treated: Yes     HDL Cholesterol: 57 mg/dL     Total Cholesterol: 162 mg/dL Lab Results  Component Value Date   CHOL 162 10/18/2021   CHOL 155 03/26/2021   CHOL 142 08/02/2020   Lab Results  Component Value Date   HDL 57 10/18/2021   HDL 58 03/26/2021   HDL 51 08/02/2020   Lab Results  Component Value Date   LDLCALC 79 10/18/2021   [place] 76 03/26/2021   [place] 67 08/02/2020   Lab Results  Component Value Date   TRIG  149 10/18/2021   TRIG 121 03/26/2021   TRIG 140 08/02/2020  No results found for: "CHOLHDL" No results found for: "LDLDIRECT" -Not ideally controlled -Current treatment: Rosuvastatin 10m Appropriate, Query effective, = -Medications previously tried: N/A  -Current dietary patterns: "Tries to eat healthy" -Current exercise habits: works part time -Educated on Cholesterol goals;  August 2023: Recommend increasing statin  Diabetes (A1c goal <7%) Lab Results  Component Value Date   HGBA1C 6.8 (H) 10/18/2021   HGBA1C 7.4 (H) 03/26/2021   HGBA1C 7.0 (H) 11/05/2020   Lab Results  Component Value Date   MICROALBUR 80 (H) 03/26/2021   LDLCALC 79 10/18/2021   CREATININE 1.12 (H) 10/18/2021   Lab Results   Component Value Date   NA 137 10/18/2021   K 4.6 10/18/2021   CREATININE 1.12 (H) 10/18/2021   EGFR 52 (L) 10/18/2021   GFRNONAA 42 (L) 04/04/2020   GLUCOSE 132 (H) 10/18/2021   Lab Results  Component Value Date   WBC 6.1 10/18/2021   HGB 10.9 (L) 10/18/2021   HCT 32.8 (L) 10/18/2021   MCV 91 10/18/2021   PLT 223 10/18/2021   Lab Results  Component Value Date   LABMICR See below: 11/05/2021   LABMICR See below: 03/26/2021   MICROALBUR 80 (H) 03/26/2021   MICROALBUR 30 (H) 05/19/2019  -Controlled -Current medications: Metformin [id number] Appropriate, Effective, Safe, Accessible Trulicity 4.0.9OBPAP submitted August 2023, not approved yet) Appropriate, Effective, Safe, Query accessible -Medications previously tried: N/A  -Current home glucose readings fasting glucose:  August 2023: Was at work, didn't have numbers -Denies hypoglycemic/hyperglycemic symptoms -Current exercise: Working part time -Educated on A1c and blood sugar goals; -Counseled to check feet daily and get yearly eye exams August 2023: Spoke with Dr. JoWynetta EmeryNo Trulicity samples left. Dr. JoWynetta Emeryeft 2.69m40mounjaro samples for patient. Called to let patient know. Patient brought in Trulicity PAP last week, told patient to call in 7 days to see if approved  Depression/Anxiety (Goal: improve symptoms) -Controlled -Current treatment: Bupropion [id number], Effective, Safe, Accessible -Medications previously tried/failed: N/A -PHQ9:     11/05/2021    4:09 PM 10/18/2021    9:58 AM 03/26/2021    2:12 PM  Depression screen PHQ 2/9  Decreased Interest 0 0 0  Down, Depressed, Hopeless 0 0 0  PHQ - 2 Score 0 0 0  Altered sleeping 0 0 0  Tired, decreased energy 0 0 0  Change in appetite 0 0 0  Feeling bad or failure about yourself  0 0 0  Trouble concentrating 0 0 0  Moving slowly or fidgety/restless 0 0 0  Suicidal thoughts 0 0 0  PHQ-9 Score 0 0 0  Difficult doing work/chores Not difficult at all  Not difficult at all   -GAD7:     11/05/2021    4:10 PM 10/18/2021    9:59 AM 03/26/2021    2:12 PM 01/17/2020   10:59 AM  GAD 7 : Generalized Anxiety Score  Nervous, Anxious, on Edge 0 0 0 0  Control/stop worrying 0 0 0 0  Worry too much - different things 0 0 0 0  Trouble relaxing 0 0 0 0  Restless 0 0 0 0  Easily annoyed or irritable 0 0 0 0  Afraid - awful might happen 0 0 0 0  Total GAD 7 Score 0 0 0 0  Anxiety Difficulty Not difficult at all   Not difficult at all  -Educated on Benefits of medication for symptom  control -Recommended to continue current medication  Patient Goals/Self-Care Activities Patient will:  - take medications as prescribed as evidenced by patient report and record review  Follow Up Plan: The patient has been provided with contact information for the care management team and has been advised to call with any health related questions or concerns.   CPP F/U PRN   [place] Constable, Pharm.D. - [phone]        Medication Assistance:  Trulicity: PAP submitted for 2023, not approved yet as of August 2023  Compliance/Adherence/Medication fill history:  Patient's preferred pharmacy is:  [facility] [address] (N), [place] - [place] ([place]) [place] [address] Phone: [phone] Fax: [phone]  Uses pill box? Yes Pt endorses 100% compliance  We discussed: Benefits of medication synchronization, packaging and delivery as well as enhanced pharmacist oversight with [facility]. Patient decided to: Utilize [facility] pharmacy for medication synchronization, packaging and delivery Verbal consent obtained for [facility] enhanced pharmacy services (medication synchronization, adherence packaging, delivery coordination). A medication sync plan was created to allow patient to get all medications delivered once every 30 to 90 days per patient preference. Patient understands they have freedom to choose  pharmacy and clinical pharmacist will coordinate care between all prescribers and [facility].   Care Plan and Follow Up Patient Decision:  Patient agrees to Care Plan and Follow-up.  Plan: The patient has been provided with contact information for the care management team and has been advised to call with any health related questions or concerns.   CPP F/U PRN  [place] Constable, Pharm.D. - [phone]

## 2021-11-18 NOTE — Patient Instructions (Signed)
Visit Information   Goals Addressed   None    Patient Care Plan: RNCM: Medication Compliance     Problem Identified: RNCM: Medication Adherence (Wellness)   Priority: High  Note:   The patient states she is out of Trulicity and can not afford the 224.00 to get it filled.  Has submitted paperwork to pharmacist.     Long-Range Goal: RNCM: Medication Adherence- Trulicity for management of DM   Start Date: 03/20/2020  Expected End Date: 05/19/2020  This Visit's Progress: On track  Priority: High  Note:   Current Barriers:  Knowledge Deficits related to calling the office to see if any samples of Turlicity are available and maintaining contact with the CCM pharmacist for expressed needs of needing help with obtaining Trulicity to take as prescribed  Care Coordination needs related to patient being out of Trulicity  in a patient with DM that is uncontrolled  Chronic Disease Management support and education needs related to DM  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 Blood sugars are elevated. 10-26-2020: The patient has been approved for Trulicity to receive free for the rest of 2022. The patient ask about process for 2023 and information provided, will collaborate with the pharm D.  Unable to self administer medications as prescribed Does not adhere to prescribed medication regimen Does not maintain contact with provider office Does not contact provider office for questions/concerns  Nurse Case Manager Clinical Goal(s):   patient will verbalize understanding of plan for working with the pcp and CCM pharmacist to get needed supply of Trulicity without missing doses of medication patient will attend all scheduled medical appointments: 11-05-2020 at [place] am patient will work with CM team pharmacist to get needed paperwork  completed for help with Trulicity supply- 11-27-452: Has been approved for Trulicity for the year 0981 to receive free   the patient will demonstrate ongoing self health care management ability as evidenced by being proactive in her care and notifying the CCM team when she has questions/concerns/needs  Interventions:  1:1 collaboration with Valerie Roys, DO regarding development and update of comprehensive plan of care as evidenced by provider attestation and co-signature Inter-disciplinary care team collaboration (see longitudinal plan of care) Evaluation of current treatment plan related to DM management and the use of Trulicity and patient's adherence to plan as established by provider.10-26-2020: The patient has received approval to get her Trulicity for the year 1914 for free. She is taking as prescribed. The patient did inquire about Trulicity for 7829 and advised she would likely have to reapply but would collaborate with the pcp. The patient also states that her power went off for 2 hours there other day and her Trulicity was in the fridge, she did not open the door but ask if the medication was still good. Advised the medication should be fine but would clarify with the pharm D.  Advised patient to take calls from the CCM pharmacist, call when messages are left and communicate needs to the CCM team.  Provided education to patient re: RNCM contacting the pharmacist and Dr. Wynetta Emery with expressed needs Reviewed medications with patient and discussed the patient is not being compliant with Trulicity. Out of medication x 2 weeks. Can not afford 224.00 to refill. States she filled out paperwork in October but has not heard back from the paperwork being  submitted. 10-26-2020: Is taking as prescribed will collaborate with the pharm D about applying for assistance for 2023 Collaborated with CCM pharmacist  regarding help with patient getting Trulicity- 08-28-3417: Pharm D is active in the patients plan of  care Pharmacy referral for cost prohibitive needs related to not being able to afford Trulicity and being out x 2 weeks. 10-26-2020: The patient is working with the pharm D - barriers to medication adherence identified - completion of financial assistance requests facilitated - medication list reviewed - medication-adherence assessment completed - medication reminder use encouraged - self-management plan initiated or updated - strategies for improving adherence encouraged - understanding of current medications assessed  Patient Goals/Self-Care Activities Over the next 120 days, patient will:  - Patient will self administer medications as prescribed Patient will attend all scheduled provider appointments Patient will call pharmacy for medication refills Patient will call provider office for new concerns or questions  Follow Up Plan: Telephone follow up appointment with care management team member scheduled for: 01-11-2021 at 11:45am        Task: RNCM: Optimize Medication Use Completed 10/26/2020  Outcome: Positive  Note:   Care Management Activities:    - barriers to medication adherence identified - completion of financial assistance requests facilitated - medication list reviewed - medication-adherence assessment completed - medication reminder use encouraged - self-management plan initiated or updated - strategies for improving adherence encouraged - understanding of current medications assessed    Notes: Pharmacist is working with the patient also    Patient Care Plan: RNCM:Diabetes Type 2 (Adult)     Problem Identified: RNCM: Glycemic Management (Diabetes, Type 2)   Priority: High  Note:   Uncontrolled DM    Long-Range Goal: RNCM: Disease Management and Care Coordination of DM   Start Date: 03/20/2020  Expected End Date: 09/20/2021  This Visit's Progress: On track  Priority: High  Note:   Objective:  Lab Results  Component Value Date   HGBA1C 8.7 (H)  08/02/2020    Lab Results  Component Value Date   CREATININE 1.11 (H) 08/02/2020   CREATININE 1.27 (H) 04/04/2020   CREATININE 1.27 (H) 03/21/2020    No results found for: EGFR Current Barriers:  Knowledge Deficits related to basic Diabetes pathophysiology and self care/management Knowledge Deficits related to medications used for management of diabetes Difficulty obtaining or cannot afford medications Financial Constraints Limited Social Support Unable to independently manage DM and inability to take Trulicity as prescribed due to being cost prohibitive Unable to self administer medications as prescribed Does not adhere to provider recommendations re: taking medications and calling the office when she is out of Trulicity and can not afford.  Does not adhere to prescribed medication regimen Does not contact provider office for questions/concerns Case Manager Clinical Goal(s):  patient will demonstrate improved adherence to prescribed treatment plan for diabetes self care/management as evidenced by:  daily monitoring and recording of CBG  adherence to ADA/ carb modified diet adherence to prescribed medication regimen Interventions:  Provided education to patient about basic DM disease process Reviewed medications with patient and discussed importance of medication adherence. 10-26-2020: The patient is compliant with her medications regimen at this time. Has Trulicity paid for for the remainder of 2022 Discussed plans with patient for ongoing care management follow up and provided patient with direct contact information for care management team Provided patient with written educational materials related to hypo and hyperglycemia and importance of correct treatment Reviewed scheduled/upcoming provider appointments including: 11-05-2020 at 0940 am  Advised patient, providing education and rationale, to check cbg bid and record, calling pcp for findings outside established parameters.   10-26-2020: The patient is checking blood sugars regularly and recording. States that her range has been 100 to 217. The patient states she believes the 217 is because she was at work with no air conditioner and she was throwing up. She states she has not had any issues since then. Denies any lows at this time.  Referral made to pharmacy team for assistance with Trulicity needs. 10-26-2020: Working with pharm D Review of patient status, including review of consultants reports, relevant laboratory and other test results, and medications completed. - barriers to adherence to treatment plan identified - blood glucose monitoring encouraged - blood glucose readings reviewed; 10-26-2020: Range has been 100 to 217 - individualized medical nutrition therapy provided- 10-26-2020: Is adherent to heart heatlhy/ADA diet - mutual A1C goal set or reviewed: 10-26-2020: Sees the pcp on 11-05-2020, will have new blood work. Last A1C elevated. Reviewed goal of A1C to be 7.0 or less. The patient is hopeful that her A1C is dropping.  - resources required to improve adherence to care identified - self-awareness of signs/symptoms of hypo or hyperglycemia encouraged - use of blood glucose monitoring log promoted Patient Goals/Self-Care Activities Over the next 120 days, patient will:  - UNABLE to independently manage DM Self administers oral medications as prescribed Self administers injectable DM medication (Trulicity) as prescribed Attends all scheduled provider appointments Checks blood sugars as prescribed and utilize hyper and hypoglycemia protocol as needed Adheres to prescribed ADA/carb modified Follow Up Plan: Telephone follow up appointment with care management team member scheduled for: [id number] at 11:45 am    Task: RNCM: Alleviate Barriers to Glycemic Management Completed 10/26/2020  Outcome: Positive  Note:   Care Management Activities:    - barriers to adherence to treatment plan identified - blood glucose  monitoring encouraged - blood glucose readings reviewed - individualized medical nutrition therapy provided - mutual A1C goal set or reviewed - resources required to improve adherence to care identified - self-awareness of signs/symptoms of hypo or hyperglycemia encouraged - use of blood glucose monitoring log promoted        Patient Care Plan: RNCM: Depression Management     Problem Identified: RNCM: Depression Identification (Depression)   Priority: Medium  Note:   The patient has a history of depression    Long-Range Goal: RNCM: Depression Management   Start Date: 03/20/2020  Expected End Date: 09/20/2020  This Visit's Progress: On track  Priority: Medium  Note:   Current Barriers:  Knowledge Deficits related to effective management of depression Chronic Disease Management support and education needs related to depression Lacks caregiver support.  Unable to independently depression Unable to self administer medications as prescribed Lacks social connections Does not contact provider office for questions/concerns  Nurse Case Manager Clinical Goal(s):   patient will verbalize understanding of plan for management of depression patient will attend all scheduled medical appointments: 11-05-2020 with pcp  the patient will demonstrate ongoing self health care management ability as evidenced by mood stable and no new concerns related to depression  Interventions:  1:1 collaboration with Valerie Roys, DO regarding development and update of comprehensive plan of care as evidenced by provider attestation and co-signature Inter-disciplinary care team collaboration (see longitudinal plan of care) Evaluation of current treatment plan related to depression and patient's adherence to plan as established by provider. 10-26-2020: The patient feels she is at a good place with  her health and well being at this time. States her depression is under control and she feels a lot better about  things. Will continue to monitor.  Advised patient to call with changes in mood or depressed state Provided education to patient re: depression and effective management  Discussed plans with patient for ongoing care management follow up and provided patient with direct contact information for care management team - anxiety screen reviewed - depression screen reviewed - medication list reviewed  Patient Goals/Self-Care Activities Over the next 120 days, patient will:  - Patient will self administer medications as prescribed Patient will attend all scheduled provider appointments Patient will call provider office for new concerns or questions  Follow Up Plan: Telephone follow up appointment with care management team member scheduled for: 01-11-2021 at 11:45 am        Task: RNCM: Identify Depressive Symptoms and Facilitate Treatment Completed 10/26/2020  Outcome: Positive  Note:   Care Management Activities:    - anxiety screen reviewed - depression screen reviewed - medication list reviewed        Patient Care Plan: RNCM: Management of HLD     Problem Identified: RNCM: Management of HLD and associated issues related to HLD/Cardiac function   Priority: Medium     Long-Range Goal: RNCM: Effective management of HLD   Start Date: 03/20/2020  Expected End Date: 09/20/2021  This Visit's Progress: On track  Priority: Medium  Note:   Current Barriers:  Knowledge Deficits related to needed follow up for abnormal mammogram with abnormality in right breast. Patient states she needs a referral for follow up and has not heard back from the office  Care Coordination needs related to need for follow up for [place] of right breast and new onset of dizziness over the last 3 weeks in a patient with HLD and other Chronic Conditions Chronic Disease Management support and education needs related to HLD and associated chronic conditions  Lacks caregiver support.  Film/video editor.   Non-adherence to prescribed medication regimen Difficulty obtaining medications Unable to independently manage HLD, follow up needs for abnormal mammogram and new onset of dizziness Unable to self administer medications as prescribed Does not adhere to provider recommendations re: follow up [place] for abnormal mammogram Does not adhere to prescribed medication regimen Lacks social connections Does not maintain contact with provider office Does not contact provider office for questions/concerns  Nurse Case Manager Clinical Goal(s):   patient will verbalize understanding of plan for HLD and following up with the pcp for dizziness and abnormal mammogram findings  patient will work with [facility], CCM team and pcp to address needs related to medication compliance, expressed needs to be addressed related to abnormal mammogram and new onset of dizziness.  , patient will attend all scheduled medical appointments: next appointment with the pcp on 11-05-2020  patient will demonstrate improved health management independence as evidenced bycompliance with the plan of care established by the provider, cholesterol levels stable and stabilized condition  the patient will demonstrate ongoing self health care management ability as evidenced by having the means to get needed medications, call provider office for new changes or concerns, and work with the CCM team to meet her health and wellness goals.   Interventions:  1:1 collaboration with Valerie Roys, DO regarding development and update of comprehensive plan of care as evidenced by provider attestation and co-signature Inter-disciplinary care team collaboration (see longitudinal plan of care) Evaluation of current treatment plan related to HLD  and patient's adherence  to plan as established by provider.10-26-2020: The patient is doing well and denies any issue with HLD management. She still has not gotten her mammogram. Reminded the patient to get her mammogram for  follow up Advised patient to write down questions for the pcp to address at next pcp visit, especially concerning new onset of dizziness and abnormal mammogram with suggestion to have further testing of right breast.  Provided education to patient re: heart healthy diet and adherence. Possiblity of patient having orthostatic hypotension, review of safety- patient had a fall 2 weeks ago without injury. Did not go to be evaluated. 10-26-2020: The patient denies any new falls or dizziness at this time. Feels she is at a good place.  Reviewed medications with patient and discussed compliance. 10-26-2020: States that she is taking her medications as directed.  Discussed plans with patient for ongoing care management follow up and provided patient with direct contact information for care management team Provided patient with HLD educational materials related to how to best control HLD and dietary restrictions through the my chart system Reviewed scheduled/upcoming provider appointments including: 11-05-2020 at 0940 am Patient Goals/Self-Care Activities Over the next 120 days, patient will:  - Patient will self administer medications as prescribed Patient will attend all scheduled provider appointments Patient will call pharmacy for medication refills Patient will call provider office for new concerns or questions  Follow Up Plan: Telephone follow up appointment with care management team member scheduled for: 01-11-2021 at 11:45 am        Patient Care Plan: CCM Pharmacy Care Plan     Problem Identified: DM2 uncontrolled, CKD, HTN, HLD, Depression   Priority: High     Long-Range Goal: Disease Management   Start Date: 08/29/2020  Recent Progress: On track  Priority: High  Note:   Current Barriers:  Unable to independently afford treatment regimen  Pharmacist Clinical Goal(s):  Patient will contact provider office for questions/concerns as evidenced notation of same in electronic health record  through collaboration with PharmD and provider.   Interventions: 1:1 collaboration with Valerie Roys, DO regarding development and update of comprehensive plan of care as evidenced by provider attestation and co-signature Inter-disciplinary care team collaboration (see longitudinal plan of care) Comprehensive medication review performed; medication list updated in electronic medical record  Hypertension (BP goal <140/90) BP Readings from Last 3 Encounters:  11/05/21 135/71  10/18/21 125/75  05/21/21 (!) 158/85  Controlled -Current treatment: Lisinopril/HCTZ 20/25 Appropriate, Effective, Safe, Accessible -Medications previously tried: N/A  -Current home readings: Doesn't test -Current dietary habits: "Tries to eat healthy" -Current exercise habits: Works part-time -Denies hypotensive/hypertensive symptoms -Educated on BP goals and benefits of medications for prevention of heart attack, stroke and kidney damage; -Counseled to monitor BP at home prn, document, and provide log at future appointments -Recommended to continue current medication  Hyperlipidemia: (LDL goal < 70) The 10-year ASCVD risk score (Arnett DK, et al., 2019) is: 35.7%   Values used to calculate the score:     Age: 76 years     Sex: Female     Is Non-Hispanic African American: No     Diabetic: Yes     Tobacco smoker: No     Systolic Blood Pressure: 570 mmHg     Is BP treated: Yes     HDL Cholesterol: 57 mg/dL     Total Cholesterol: 162 mg/dL Lab Results  Component Value Date   CHOL 162 10/18/2021   CHOL 155 03/26/2021   CHOL 142 08/02/2020  Lab Results  Component Value Date   HDL 57 10/18/2021   HDL 58 03/26/2021   HDL 51 08/02/2020   Lab Results  Component Value Date   LDLCALC 79 10/18/2021   LDLCALC 76 03/26/2021   LDLCALC 67 08/02/2020   Lab Results  Component Value Date   TRIG 149 10/18/2021   TRIG 121 03/26/2021   TRIG 140 08/02/2020  No results found for: "CHOLHDL" No results  found for: "LDLDIRECT" -Not ideally controlled -Current treatment: Rosuvastatin 32m Appropriate, Query effective, = -Medications previously tried: N/A  -Current dietary patterns: "Tries to eat healthy" -Current exercise habits: works part time -Educated on Cholesterol goals;  August 2023: Recommend increasing statin  Diabetes (A1c goal <7%) Lab Results  Component Value Date   HGBA1C 6.8 (H) 10/18/2021   HGBA1C 7.4 (H) 03/26/2021   HGBA1C 7.0 (H) 11/05/2020   Lab Results  Component Value Date   MICROALBUR 80 (H) 03/26/2021   LDLCALC 79 10/18/2021   CREATININE 1.12 (H) 10/18/2021   Lab Results  Component Value Date   NA 137 10/18/2021   K 4.6 10/18/2021   CREATININE 1.12 (H) 10/18/2021   EGFR 52 (L) 10/18/2021   GFRNONAA 42 (L) 04/04/2020   GLUCOSE 132 (H) 10/18/2021   Lab Results  Component Value Date   WBC 6.1 10/18/2021   HGB 10.9 (L) 10/18/2021   HCT 32.8 (L) 10/18/2021   MCV 91 10/18/2021   PLT 223 10/18/2021   Lab Results  Component Value Date   LABMICR See below: 11/05/2021   LABMICR See below: 03/26/2021   MICROALBUR 80 (H) 03/26/2021   MICROALBUR 30 (H) 05/19/2019  -Controlled -Current medications: Metformin 10082mBID Appropriate, Effective, Safe, Accessible Trulicity [id number] submitted August 2023, not approved yet) Appropriate, Effective, Safe, Query accessible -Medications previously tried: N/A  -Current home glucose readings fasting glucose:  August 2023: Was at work, didn't have numbers -Denies hypoglycemic/hyperglycemic symptoms -Current exercise: Working part time -Educated on A1c and blood sugar goals; -Counseled to check feet daily and get yearly eye exams August 2023: Spoke with Dr. JoWynetta EmeryNo Trulicity samples left. Dr. JoWynetta Emeryeft 2.45m40mounjaro samples for patient. Called to let patient know. Patient brought in Trulicity PAP last week, told patient to call in 7 days to see if approved  Depression/Anxiety (Goal: improve  symptoms) -Controlled -Current treatment: Bupropion 150m66mpropriate, Effective, Safe, Accessible -Medications previously tried/failed: N/A -PHQ9:     11/05/2021    4:09 PM 10/18/2021    9:58 AM 03/26/2021    2:12 PM  Depression screen PHQ 2/9  Decreased Interest 0 0 0  Down, Depressed, Hopeless 0 0 0  PHQ - 2 Score 0 0 0  Altered sleeping 0 0 0  Tired, decreased energy 0 0 0  Change in appetite 0 0 0  Feeling bad or failure about yourself  0 0 0  Trouble concentrating 0 0 0  Moving slowly or fidgety/restless 0 0 0  Suicidal thoughts 0 0 0  PHQ-9 Score 0 0 0  Difficult doing work/chores Not difficult at all Not difficult at all   -GAD7:     11/05/2021    4:10 PM 10/18/2021    9:59 AM 03/26/2021    2:12 PM 01/17/2020   10:59 AM  GAD 7 : Generalized Anxiety Score  Nervous, Anxious, on Edge 0 0 0 0  Control/stop worrying 0 0 0 0  Worry too much - different things 0 0 0 0  Trouble relaxing 0 0 0 0  Restless 0 0 0 0  Easily annoyed or irritable 0 0 0 0  Afraid - awful might happen 0 0 0 0  Total GAD 7 Score 0 0 0 0  Anxiety Difficulty Not difficult at all   Not difficult at all  -Educated on Benefits of medication for symptom control -Recommended to continue current medication  Patient Goals/Self-Care Activities Patient will:  - take medications as prescribed as evidenced by patient report and record review  Follow Up Plan: The patient has been provided with contact information for the care management team and has been advised to call with any health related questions or concerns.   CPP F/U PRN   [place] Constable, Pharm.D. - [phone]      Patient Care Plan: RNCM: Hypertension (Adult)     Problem Identified: RNCM: Disease Progression (Hypertension)   Priority: Medium     Long-Range Goal: RNCM: Disease Progression Prevented or Minimized   Start Date: 10/26/2020  Expected End Date: 10/26/2021  This Visit's Progress: On track  Priority: Medium  Note:   Objective:   Last practice recorded BP readings:  BP Readings from Last 3 Encounters:  08/02/20 130/78  04/19/20 124/71  04/03/20 140/75   Most recent eGFR/CrCl:  Lab Results  Component Value Date   EGFR 52 (L) 08/02/2020    No components found for: CRCL Current Barriers:  Knowledge Deficits related to basic understanding of hypertension pathophysiology and self care management Knowledge Deficits related to understanding of medications prescribed for management of hypertension Difficulty obtaining medications Unable to independently manage HTN Does not contact provider office for questions/concerns Case Manager Clinical Goal(s):  patient will verbalize understanding of plan for hypertension management patient will attend all scheduled medical appointments: 11-05-2020 at [place] am patient will demonstrate improved adherence to prescribed treatment plan for hypertension as evidenced by taking all medications as prescribed, monitoring and recording blood pressure as directed, adhering to low sodium/DASH diet patient will demonstrate improved health management independence as evidenced by checking blood pressure as directed and notifying PCP if SBP>150 or DBP > 90, taking all medications as prescribe, and adhering to a low sodium diet as discussed. patient will verbalize basic understanding of hypertension disease process and self health management plan as evidenced by compliance with medications, compliance with heart healthy/Ada diet and working with the CCM team to effectively manage health and well being.  Interventions:  Collaboration with Valerie Roys, DO regarding development and update of comprehensive plan of care as evidenced by provider attestation and co-signature Inter-disciplinary care team collaboration (see longitudinal plan of care) Evaluation of current treatment plan related to hypertension self management and patient's adherence to plan as established by provider. Provided education  to patient re: stroke prevention, s/s of heart attack and stroke, DASH diet, complications of uncontrolled blood pressure Reviewed medications with patient and discussed importance of compliance Discussed plans with patient for ongoing care management follow up and provided patient with direct contact information for care management team Advised patient, providing education and rationale, to monitor blood pressure daily and record, calling PCP for findings outside established parameters.  Reviewed scheduled/upcoming provider appointments including: 11-05-2020 at 0940 am Self-Care Activities: - Self administers medications as prescribed Attends all scheduled provider appointments Calls provider office for new concerns, questions, or BP outside discussed parameters Checks BP and records as discussed Follows a low sodium diet/DASH diet Patient Goals: - check blood pressure 3 times per week - choose a place to take my blood pressure (home, clinic or  office, retail store) - write blood pressure results in a log or diary - agree on reward when goals are met - agree to work together to make changes - ask questions to understand - have a family meeting to talk about healthy habits - learn about high blood pressure  Follow Up Plan: Telephone follow up appointment with care management team member scheduled for: 01-11-2021 at 1145 am    Task: Alleviate Barriers to Hypertension Treatment Completed 10/26/2020  Outcome: Positive  Note:   Care Management Activities:    - difficulty of making life-long changes acknowledged - healthy diet promoted - healthy family lifestyle promoted - medication side effects managed - pain assessed and managed - patient response to treatment assessed - quality of sleep assessed - reduction of dietary sodium encouraged - reduction in sedentary activities encouraged - response to pharmacologic therapy monitored - sleep hygiene techniques encouraged    Notes:       Ms. Mastropietro was given information about Chronic Care Management services today including:  CCM service includes personalized support from designated clinical staff supervised by her physician, including individualized plan of care and coordination with other care providers 24/7 contact phone numbers for assistance for urgent and routine care needs. Standard insurance, coinsurance, copays and deductibles apply for chronic care management only during months in which we provide at least 20 minutes of these services. Most insurances cover these services at 100%, however patients may be responsible for any copay, coinsurance and/or deductible if applicable. This service may help you avoid the need for more expensive face-to-face services. Only one practitioner may furnish and bill the service in a calendar month. The patient may stop CCM services at any time (effective at the end of the month) by phone call to the office staff.  Patient agreed to services and verbal consent obtained.   The patient verbalized understanding of instructions, educational materials, and care plan provided today and DECLINED offer to receive copy of patient instructions, educational materials, and care plan.  The pharmacy team will reach out to the patient again over the next 60 days.   Lane Hacker, [place]

## 2021-11-21 DIAGNOSIS — E785 Hyperlipidemia, unspecified: Secondary | ICD-10-CM | POA: Diagnosis not present

## 2021-11-21 DIAGNOSIS — Z794 Long term (current) use of insulin: Secondary | ICD-10-CM | POA: Diagnosis not present

## 2021-11-21 DIAGNOSIS — I10 Essential (primary) hypertension: Secondary | ICD-10-CM | POA: Diagnosis not present

## 2021-11-21 DIAGNOSIS — E1159 Type 2 diabetes mellitus with other circulatory complications: Secondary | ICD-10-CM

## 2021-11-29 ENCOUNTER — Telehealth: Payer: Self-pay

## 2021-11-29 NOTE — Telephone Encounter (Signed)
Finished Onboard to [employer]

## 2021-12-01 MED ORDER — OMEPRAZOLE 20 MG PO CPDR: 40.0000 mg | DELAYED_RELEASE_CAPSULE | Freq: Every day | ORAL | 1 refills | Status: DC

## 2021-12-01 MED ORDER — TRULICITY 4.5 MG/0.5ML ~~LOC~~ SOAJ: 4.5000 mg | SUBCUTANEOUS | 1 refills | Status: DC

## 2021-12-01 MED ORDER — BUPROPION HCL ER (XL) 150 MG PO TB24: 150.0000 mg | ORAL_TABLET | Freq: Every day | ORAL | 1 refills | Status: DC

## 2021-12-01 MED ORDER — LISINOPRIL-HYDROCHLOROTHIAZIDE 20-25 MG PO TABS: 1.0000 | ORAL_TABLET | Freq: Every day | ORAL | 1 refills | Status: DC

## 2021-12-01 MED ORDER — GLUCOSE BLOOD VI STRP: ORAL_STRIP | 12 refills | Status: DC

## 2021-12-01 MED ORDER — METFORMIN HCL 1000 MG PO TABS: 1000.0000 mg | ORAL_TABLET | Freq: Two times a day (BID) | ORAL | 1 refills | Status: DC

## 2021-12-01 MED ORDER — ONETOUCH ULTRA 2 W/DEVICE KIT: PACK | 12 refills | Status: DC

## 2021-12-01 MED ORDER — ONETOUCH ULTRA VI STRP: ORAL_STRIP | 12 refills | Status: DC

## 2021-12-01 MED ORDER — ROSUVASTATIN CALCIUM 10 MG PO TABS: 10.0000 mg | ORAL_TABLET | Freq: Every day | ORAL | 0 refills | Status: DC

## 2021-12-01 NOTE — Telephone Encounter (Signed)
-----   Message from Rance Muir sent at 11/28/2021  4:29 PM EDT ----- Regarding: Send Rxs to [facility] Good afternoon!  Patient is switching to [facility]. Do you mind sending in Rx refills on all of her medications to [facility] when you have a chance as well as her One touch lanets Thank you!  Rance Muir, RMA Health Concierge

## 2021-12-03 ENCOUNTER — Telehealth: Payer: Self-pay

## 2021-12-03 NOTE — Chronic Care Management (AMB) (Unsigned)
  Chronic Care Management Note  12/03/2021 Name: Alison Hernandez MRN: [id number] DOB: [birth year]  Alison Hernandez is a 76 y.o. year old female who is a primary care patient of Dorcas Carrow, DO and is actively engaged with the care management team. I reached out to Caren Hazy by phone today to assist with re-scheduling a follow up visit with the Pharmacist  Follow up plan: Unsuccessful telephone outreach attempt made. A HIPAA compliant phone message was left for the patient providing contact information and requesting a return call.  The care management team will reach out to the patient again over the next 7 days.  If patient returns call to provider office, please advise to call Embedded Care Management Care Guide Penne Lash  at [phone]  Penne Lash, RMA Care Guide [facility]  [place], [place] [address] Direct Dial: [phone] [email or web address]

## 2021-12-04 NOTE — Chronic Care Management (AMB) (Signed)
  Chronic Care Management Note  12/04/2021 Name: [name] MRN: [id number] DOB: 14-Sep-1945  Alison Hernandez is a 76 y.o. year old female who is a primary care patient of Dorcas Carrow, DO and is actively engaged with the care management team. I reached out to Caren Hazy by phone today to assist with re-scheduling a follow up visit with the Pharmacist  Follow up plan: Patient declines further follow up and engagement by the care management team. Appropriate care team members and provider have been notified via electronic communication.   Penne Lash, RMA Care Guide [facility] [place], [place] [address] Direct Dial: [phone] [email or web address]

## 2021-12-05 ENCOUNTER — Ambulatory Visit: Payer: Medicare Other

## 2021-12-06 ENCOUNTER — Ambulatory Visit: Payer: Medicare Other

## 2021-12-12 ENCOUNTER — Ambulatory Visit (INDEPENDENT_AMBULATORY_CARE_PROVIDER_SITE_OTHER): Payer: Medicare Other

## 2021-12-12 DIAGNOSIS — Z23 Encounter for immunization: Secondary | ICD-10-CM | POA: Diagnosis not present

## 2021-12-12 NOTE — Progress Notes (Signed)
[id number]  

## 2021-12-16 ENCOUNTER — Telehealth: Payer: Self-pay | Admitting: Family Medicine

## 2021-12-16 NOTE — Telephone Encounter (Signed)
Pt called needing Dr. Wynetta Emery or her nurse to call her back regarding the Trulicity.  CB@  [phone]

## 2021-12-17 NOTE — Telephone Encounter (Signed)
Attempted to contact patient, NA LVM for patient to call back.  

## 2021-12-18 NOTE — Telephone Encounter (Signed)
Attempted to contact patient again, NA LVM

## 2021-12-27 NOTE — Telephone Encounter (Signed)
Patient states she was waiting on patient assistance for Trulicity but has received a letter she was approved. No further questions or concerns.

## 2022-01-09 ENCOUNTER — Other Ambulatory Visit: Payer: Self-pay | Admitting: Family Medicine

## 2022-01-09 NOTE — Telephone Encounter (Signed)
Rx 12/01/21 #90- too soon Requested Prescriptions  Pending Prescriptions Disp Refills  . rosuvastatin (CRESTOR) 10 MG tablet [Pharmacy Med Name: rosuvastatin 10 mg tablet] 90 tablet 2    Sig: TAKE ONE TABLET BY MOUTH EVERY EVENING     Cardiovascular:  Antilipid - Statins 2 Failed - 01/09/2022  2:21 PM      Failed - Cr in normal range and within 360 days    Creatinine, Ser  Date Value Ref Range Status  10/18/2021 1.12 (H) 0.57 - 1.00 mg/dL Final         Failed - Lipid Panel in normal range within the last 12 months    Cholesterol, Total  Date Value Ref Range Status  10/18/2021 162 100 - 199 mg/dL Final   Cholesterol Piccolo, Waived  Date Value Ref Range Status  10/25/2015 228 (H) <200 mg/dL Final    Comment:                            Desirable                <200                         Borderline High      200- 239                         High                     >239    LDL Chol Calc (NIH)  Date Value Ref Range Status  10/18/2021 79 0 - 99 mg/dL Final   HDL  Date Value Ref Range Status  10/18/2021 57 >39 mg/dL Final   Triglycerides  Date Value Ref Range Status  10/18/2021 149 0 - 149 mg/dL Final   Triglycerides Piccolo,Waived  Date Value Ref Range Status  10/25/2015 162 (H) <150 mg/dL Final    Comment:                            Normal                   <150                         Borderline High     150 - 199                         High                200 - 499                         Very High                >499          Passed - Patient is not pregnant      Passed - Valid encounter within last 12 months    Recent Outpatient Visits          2 months ago Urinary symptom or sign   [place], Jolene T, NP   2 months ago Type 2 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 disease, without long-term current use of insulin ([place])   [place]  [place], Megan P, DO   9 months ago Routine general medical examination at a  health care facility   [place], [place] P, DO   1 year ago Type 2 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 disease, without long-term current use of insulin ([place])   [place], Megan P, DO   1 year ago Type 2 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 disease, without long-term current use of insulin ([place])   [place], [place], DO      Future Appointments            In 1 week Wynetta Emery, Barb Merino, DO [employer]

## 2022-01-15 ENCOUNTER — Telehealth: Payer: Self-pay | Admitting: Family Medicine

## 2022-01-15 NOTE — Telephone Encounter (Signed)
-----   Message from Garth Bigness sent at 01/10/2022 11:32 AM EDT ----- Regarding: Rx refill Hello, I received a message from [place] that a refill is needed for Alison Hernandez's Rosuvastatin please, she is due for a upcoming medication delivery.  Thanks so much!  [place], PTM

## 2022-01-20 ENCOUNTER — Other Ambulatory Visit: Payer: Self-pay | Admitting: Family Medicine

## 2022-01-20 ENCOUNTER — Encounter: Payer: Self-pay | Admitting: Family Medicine

## 2022-01-20 ENCOUNTER — Ambulatory Visit (INDEPENDENT_AMBULATORY_CARE_PROVIDER_SITE_OTHER): Payer: Medicare Other | Admitting: Family Medicine

## 2022-01-20 VITALS — BP 106/67 | HR 75 | Temp 98.1°F | Wt 151.8 lb

## 2022-01-20 DIAGNOSIS — N182 Chronic kidney disease, stage 2 (mild): Secondary | ICD-10-CM | POA: Diagnosis not present

## 2022-01-20 DIAGNOSIS — Z13820 Encounter for screening for osteoporosis: Secondary | ICD-10-CM

## 2022-01-20 DIAGNOSIS — Z0000 Encounter for general adult medical examination without abnormal findings: Secondary | ICD-10-CM

## 2022-01-20 DIAGNOSIS — E1122 Type 2 diabetes mellitus with diabetic chronic kidney disease: Secondary | ICD-10-CM

## 2022-01-20 DIAGNOSIS — Z7189 Other specified counseling: Secondary | ICD-10-CM

## 2022-01-20 LAB — BAYER DCA HB A1C WAIVED: HB A1C (BAYER DCA - WAIVED): 7.4 % — ABNORMAL HIGH (ref 4.8–5.6)

## 2022-01-20 MED ORDER — ONETOUCH DELICA LANCETS 33G MISC: 12 refills | Status: DC

## 2022-01-20 MED ORDER — ROSUVASTATIN CALCIUM 10 MG PO TABS: 10.0000 mg | ORAL_TABLET | Freq: Every day | ORAL | 1 refills | Status: DC

## 2022-01-20 NOTE — Assessment & Plan Note (Signed)
A voluntary discussion about advance care planning including the explanation and discussion of advance directives was extensively discussed  with the patient for 5 minutes with patient and daughter present.  Explanation about the health care proxy and Living will was reviewed and packet with forms with explanation of how to fill them out was given.  During this discussion, the patient was able to identify a health care proxy as her daughter Sonia Baller and plans to fill out the paperwork required.  Patient was offered a separate [place] visit for further assistance with forms.

## 2022-01-20 NOTE — Progress Notes (Signed)
BP 106/67   Pulse 75   Temp 98.1 F (36.7 C)   Wt 151 lb 12.8 oz (68.9 kg)   SpO2 99%   BMI 24.50 kg/m    Subjective:    Patient ID: Alison Hernandez, female    DOB: 1945-06-24, 76 y.o.   MRN: [id number]  HPI: Alison Hernandez is a 76 y.o. female presenting on 01/20/2022 for Annual Wellness Examination. Current medical complaints include:  DIABETES- was out of trulicity for 2 months, was on mounjaro for 1 month, on nothing for 1 month Hypoglycemic episodes:no Polydipsia/polyuria: no Visual disturbance: no Chest pain: no Paresthesias: no Glucose Monitoring: no  Accucheck frequency: Not Checking Taking Insulin?: no Blood Pressure Monitoring: not checking Retinal Examination: Up to Date Foot Exam: Up to Date Diabetic Education: Completed Pneumovax: Up to Date Influenza: Up to Date Aspirin: yes  She currently lives with: husband, daughter and grandson Menopausal Symptoms: no  Functional Status Survey: Is the patient deaf or have difficulty hearing?: No Does the patient have difficulty seeing, even when wearing glasses/contacts?: No Does the patient have difficulty concentrating, remembering, or making decisions?: No Does the patient have difficulty walking or climbing stairs?: No Does the patient have difficulty dressing or bathing?: No Does the patient have difficulty doing errands alone such as visiting a doctor's office or shopping?: No     01/20/2022    9:58 AM 11/05/2021    4:09 PM 03/26/2021    2:11 PM 12/03/2020    1:52 PM 01/17/2020   10:58 AM  [place] in the past year? 0 0 0 0 0  Number falls in past yr: 0 0 0  0  Injury with Fall? 0 0 0  0  Risk for fall due to : No Fall Risks No Fall Risks No Fall Risks Medication side effect No Fall Risks  Follow up Falls evaluation completed Falls evaluation completed Falls evaluation completed Falls evaluation completed;Education provided;Falls prevention discussed Falls evaluation completed    Depression  Screen    01/20/2022    9:58 AM 11/05/2021    4:09 PM 10/18/2021    9:58 AM 03/26/2021    2:12 PM 12/03/2020    1:52 PM  Depression screen PHQ 2/9  Decreased Interest 0 0 0 0 0  Down, Depressed, Hopeless 0 0 0 0 0  PHQ - 2 Score 0 0 0 0 0  Altered sleeping 0 0 0 0   Tired, decreased energy 0 0 0 0   Change in appetite 0 0 0 0   Feeling bad or failure about yourself  0 0 0 0   Trouble concentrating 0 0 0 0   Moving slowly or fidgety/restless 0 0 0 0   Suicidal thoughts 0 0 0 0   PHQ-9 Score 0 0 0 0   Difficult doing work/chores  Not difficult at all Not difficult at all      Advanced Directives Does patient have a HCPOA?    no Does patient have a living will or MOST form?  no  Past Medical History:  Past Medical History:  Diagnosis Date   Arthritis    hands   Diabetes mellitus without complication (HCC)    GERD (gastroesophageal reflux disease)    Hyperlipidemia    Hypertension    TIA (transient ischemic attack)    no deficits   Vertigo     Surgical History:  Past Surgical History:  Procedure Laterality Date   BREAST BIOPSY Left  20+ yrs ago   benign   BREAST BIOPSY Left 20+ yrs ago   benign   CATARACT EXTRACTION W/PHACO Right 05/07/2021   Procedure: CATARACT EXTRACTION PHACO AND INTRAOCULAR LENS PLACEMENT (IOC) RIGHT DIABETIC 19.49 02:17.9;  Surgeon: Birder Robson, MD;  Location: [facility];  Service: Ophthalmology;  Laterality: Right;  Diabetic   CATARACT EXTRACTION W/PHACO Left 05/21/2021   Procedure: CATARACT EXTRACTION PHACO AND INTRAOCULAR LENS PLACEMENT (IOC) LEFT DIABETIC 18.57 01:42.9 ;  Surgeon: Norvel Richards, MD;  Location: [facility];  Service: Ophthalmology;  Laterality: Left;  Diabetic   ROTATOR CUFF REPAIR Right 09/30/2016   SIGMOIDOSCOPY  1990   TOTAL ABDOMINAL HYSTERECTOMY     total    Medications:  Current Outpatient Medications on File Prior to Visit  Medication Sig   aspirin EC 81 MG tablet Take 81 mg by mouth  daily. Swallow whole.   Blood Glucose Monitoring Suppl (ONE TOUCH ULTRA 2) w/Device KIT Test 3x a day   buPROPion (WELLBUTRIN XL) 150 MG 24 hr tablet Take 1 tablet (150 mg total) by mouth daily.   glucose blood (ONETOUCH ULTRA) test strip CHECK GLUCOSE ONCE DAILY   glucose blood test strip Use as instructed   lisinopril-hydrochlorothiazide (ZESTORETIC) 20-25 MG tablet Take 1 tablet by mouth daily.   meclizine (ANTIVERT) 25 MG tablet Take 1 tablet (25 mg total) by mouth 3 (three) times daily as needed for dizziness.   metFORMIN (GLUCOPHAGE) 1000 MG tablet Take 1 tablet (1,000 mg total) by mouth 2 (two) times daily with a meal.   Multiple Vitamin (MULTIVITAMIN) tablet Take 1 tablet by mouth daily.   NYSTATIN powder APPLY  POWDER TOPICALLY 4 TIMES DAILY   omeprazole (PRILOSEC) 20 MG capsule Take 2 capsules (40 mg total) by mouth daily.   ondansetron (ZOFRAN-ODT) 8 MG disintegrating tablet DISSOLVE 1 TABLET IN MOUTH EVERY 8 HOURS AS NEEDED FOR NAUSEA OR VOMITING   TRULICITY 4.5 FG/1.8EX SOPN Inject 4.5 mg into the skin once a week.   No current facility-administered medications on file prior to visit.    Allergies:  Allergies  Allergen Reactions   Lovastatin Nausea And Vomiting   Bydureon [Exenatide] Rash   Losartan Rash    Social History:  Social History   Socioeconomic History   Marital status: Married    Spouse name: Not on file   Number of children: Not on file   Years of education: Not on file   Highest education level: Not on file  Occupational History   Not on file  Tobacco Use   Smoking status: Never   Smokeless tobacco: Never  Vaping Use   Vaping Use: Never used  Substance and Sexual Activity   Alcohol use: No   Drug use: No   Sexual activity: Not Currently    Birth control/protection: None  Other Topics Concern   Not on file  Social History Narrative   Not on file   Social Determinants of Health   Financial Resource Strain: High Risk (11/18/2021)   Overall  Financial Resource Strain (CARDIA)    Difficulty of Paying Living Expenses: Hard  Food Insecurity: No Food Insecurity (12/03/2020)   Hunger Vital Sign    Worried About Running Out of Food in the Last Year: Never true    Ran Out of Food in the Last Year: Never true  Transportation Needs: No Transportation Needs (11/18/2021)   PRAPARE - Hydrologist (Medical): No    Lack of Transportation (Non-Medical):  No  Physical Activity: Inactive (12/03/2020)   Exercise Vital Sign    Days of Exercise per Week: 0 days    Minutes of Exercise per Session: 0 min  Stress: No Stress Concern Present (12/03/2020)   [place]    Feeling of Stress : Not at all  Social Connections: Unknown (10/26/2020)   Social Connection and Isolation Panel [NHANES]    Frequency of Communication with Friends and Family: More than three times a week    Frequency of Social Gatherings with Friends and Family: More than three times a week    Attends Religious Services: Not on file    Active Member of Clubs or Organizations: Not on file    Attends Archivist Meetings: Not on file    Marital Status: Not on file  Intimate Partner Violence: Not At Risk (10/26/2020)   Humiliation, Afraid, Rape, and Kick questionnaire    Fear of Current or Ex-Partner: No    Emotionally Abused: No    Physically Abused: No    Sexually Abused: No   Social History   Tobacco Use  Smoking Status Never  Smokeless Tobacco Never   Social History   Substance and Sexual Activity  Alcohol Use No    Family History:  Family History  Problem Relation Age of Onset   Diabetes Mother    Heart disease Mother    Cancer Father        esophagus/ lung   Diabetes Maternal Grandmother    Cancer Sister        Ovarian    Past medical history, surgical history, medications, allergies, family history and social history reviewed with patient today and changes  made to appropriate areas of the chart.   Review of Systems  Constitutional: Negative.   Respiratory: Negative.    Cardiovascular: Negative.   Genitourinary: Negative.   Musculoskeletal: Negative.   Psychiatric/Behavioral: Negative.      All other ROS negative except what is listed above and in the HPI.      Objective:    BP 106/67   Pulse 75   Temp 98.1 F (36.7 C)   Wt 151 lb 12.8 oz (68.9 kg)   SpO2 99%   BMI 24.50 kg/m   Wt Readings from Last 3 Encounters:  01/20/22 151 lb 12.8 oz (68.9 kg)  11/05/21 150 lb 11.2 oz (68.4 kg)  10/18/21 147 lb 6.4 oz (66.9 kg)    Physical Exam Vitals and nursing note reviewed.  Constitutional:      General: She is not in acute distress.    Appearance: Normal appearance. She is normal weight. She is not ill-appearing, toxic-appearing or diaphoretic.  HENT:     Head: Normocephalic and atraumatic.     Right Ear: External ear normal.     Left Ear: External ear normal.     Nose: Nose normal.     Mouth/Throat:     Mouth: Mucous membranes are moist.     Pharynx: Oropharynx is clear.  Eyes:     General: No scleral icterus.       Right eye: No discharge.        Left eye: No discharge.     Extraocular Movements: Extraocular movements intact.     Conjunctiva/sclera: Conjunctivae normal.     Pupils: Pupils are equal, round, and reactive to light.  Cardiovascular:     Rate and Rhythm: Normal rate and regular rhythm.     Pulses: Normal  pulses.     Heart sounds: Normal heart sounds. No murmur heard.    No friction rub. No gallop.  Pulmonary:     Effort: Pulmonary effort is normal. No respiratory distress.     Breath sounds: Normal breath sounds. No stridor. No wheezing, rhonchi or rales.  Chest:     Chest wall: No tenderness.  Musculoskeletal:        General: Normal range of motion.     Cervical back: Normal range of motion and neck supple.  Skin:    General: Skin is warm and dry.     Capillary Refill: Capillary refill takes less  than 2 seconds.     Coloration: Skin is not jaundiced or pale.     Findings: No bruising, erythema, lesion or rash.  Neurological:     General: No focal deficit present.     Mental Status: She is alert and oriented to person, place, and time. Mental status is at baseline.  Psychiatric:        Mood and Affect: Mood normal.        Behavior: Behavior normal.        Thought Content: Thought content normal.        Judgment: Judgment normal.        01/20/2022   10:11 AM 12/03/2020    1:54 PM 11/21/2019   10:40 AM 11/18/2018    1:37 PM 11/06/2016    9:10 AM  6CIT Screen  What Year? 0 points 0 points 0 points 0 points 0 points  What month? 0 points 0 points 0 points 0 points 0 points  What time? 0 points 0 points 0 points 0 points 0 points  Count back from 20 0 points 0 points 0 points 0 points 0 points  Months in reverse 0 points 0 points 0 points 0 points 0 points  Repeat phrase 0 points 2 points 0 points 0 points 0 points  Total Score 0 points 2 points 0 points 0 points 0 points    Results for orders placed or performed in visit on 11/05/21  Urine Culture   Specimen: Urine   UR  Result Value Ref Range   Urine Culture, Routine Final report (A)    Organism ID, Bacteria Escherichia coli (A)    ORGANISM ID, BACTERIA Comment    Antimicrobial Susceptibility Comment   Microscopic Examination   Urine  Result Value Ref Range   WBC, UA 11-30 (A) 0 - 5 /hpf   RBC, Urine None seen 0 - 2 /hpf   Epithelial Cells (non renal) 0-10 0 - 10 /hpf   Mucus, UA Present (A) Not Estab.   Bacteria, UA Many (A) None seen/Few  Urinalysis, Routine w reflex microscopic  Result Value Ref Range   Specific Gravity, UA 1.025 1.005 - 1.030   pH, UA 5.5 5.0 - 7.5   Color, UA Yellow Yellow   Appearance Ur Cloudy (A) Clear   Leukocytes,UA 1+ (A) Negative   Protein,UA 1+ (A) Negative/Trace   Glucose, UA Negative Negative   Ketones, UA 1+ (A) Negative   RBC, UA Trace (A) Negative   Bilirubin, UA Negative  Negative   Urobilinogen, Ur 1.0 0.2 - 1.0 mg/dL   Nitrite, UA Negative Negative   Microscopic Examination See below:       Assessment & Plan:   Problem List Items Addressed This Visit       Endocrine   Diabetes ([place])    Doing well with A1c of 7.4-  up, but was off medicine for a month. Will continue current regimen and recheck 3 months. Call with any concerns.       Relevant Medications   rosuvastatin (CRESTOR) 10 MG tablet   Other Relevant Orders   Bayer DCA Hb A1c Waived     Other   Advance directive discussed with patient    A voluntary discussion about advance care planning including the explanation and discussion of advance directives was extensively discussed  with the patient for 5 minutes with patient and daughter present.  Explanation about the health care proxy and Living will was reviewed and packet with forms with explanation of how to fill them out was given.  During this discussion, the patient was able to identify a health care proxy as her daughter Sonia Baller and plans to fill out the paperwork required.  Patient was offered a separate [place] visit for further assistance with forms.         Other Visit Diagnoses     Encounter for Medicare annual wellness exam    -  Primary   Preventative care discussed today as below.         Preventative Services:  Health Risk Assessment and Personalized Prevention Plan: Done today Bone Mass Measurements: Ordered Breast Cancer Screening: Ordered CVD Screening: Up to date Cervical Cancer Screening: N/A Colon Cancer Screening: Do your cologuard! Depression Screening: Done today Diabetes Screening: Done today Glaucoma Screening: See your eye doctor Hepatitis B vaccine: N/A Hepatitis C screening: up to date HIV Screening: up to date Flu Vaccine: Up to date Lung cancer Screening: N/A Obesity Screening: Done today Pneumonia Vaccines (2): up to date STI Screening: N/A  Follow up plan: Return in about 3 months  (around 04/22/2022).   LABORATORY TESTING:  - Pap smear: not applicable  IMMUNIZATIONS:   - Tdap: Tetanus vaccination status reviewed: last tetanus booster within 10 years. - Influenza: Up to date - Pneumovax: Up to date - Prevnar: Up to date - Zostavax vaccine: Refused  SCREENING: -Mammogram: Ordered today  - Colonoscopy: Ordered today  - Bone Density: Ordered today    NEXT PREVENTATIVE AWV DUE IN 1 YEAR. Return in about 3 months (around 04/22/2022).

## 2022-01-20 NOTE — Patient Instructions (Signed)
Preventative Services:  Health Risk Assessment and Personalized Prevention Plan: Done today Bone Mass Measurements: Ordered Breast Cancer Screening: Ordered CVD Screening: Up to date Cervical Cancer Screening: N/A Colon Cancer Screening: Do your cologuard! Depression Screening: Done today Diabetes Screening: Done today Glaucoma Screening: See your eye doctor Hepatitis B vaccine: N/A Hepatitis C screening: up to date HIV Screening: up to date Flu Vaccine: Up to date Lung cancer Screening: N/A Obesity Screening: Done today Pneumonia Vaccines (2): up to date STI Screening: N/A

## 2022-01-20 NOTE — Assessment & Plan Note (Signed)
Doing well with A1c of 7.4- up, but was off medicine for a month. Will continue current regimen and recheck 3 months. Call with any concerns.

## 2022-03-31 ENCOUNTER — Telehealth: Payer: Self-pay | Admitting: Family Medicine

## 2022-03-31 DIAGNOSIS — E1122 Type 2 diabetes mellitus with diabetic chronic kidney disease: Secondary | ICD-10-CM

## 2022-03-31 DIAGNOSIS — Z1231 Encounter for screening mammogram for malignant neoplasm of breast: Secondary | ICD-10-CM

## 2022-03-31 NOTE — Telephone Encounter (Signed)
Left message for patient to notify her of Dr.Johnson's recommendations. Advised patient to give our office a call back if she has any questions regarding message.

## 2022-03-31 NOTE — Telephone Encounter (Signed)
Copied from [place] [id number]. Topic: Appointment Scheduling - Scheduling Inquiry for Clinic >> Mar 31, 2022  3:26 PM Erskine Squibb wrote: Reason for CRM: The patient called in stating she had to cancel her mammogram for this Thursday because she has to work. She states they told her they would need a brand new order for her to get it done and she needs to know 2 weeks in advance. Please assist patient further

## 2022-03-31 NOTE — Telephone Encounter (Signed)
Order in.

## 2022-04-03 ENCOUNTER — Other Ambulatory Visit: Payer: Medicare Other

## 2022-04-03 ENCOUNTER — Telehealth: Payer: Self-pay

## 2022-04-03 NOTE — Progress Notes (Cosign Needed Addendum)
Care Management & Coordination Services Pharmacy Team  Reason for Encounter: Medication coordination and delivery  Contacted patient on 04/03/2022 & 04/08/21 to discuss medications   Medications: Outpatient Encounter Medications as of 04/03/2022  Medication Sig   aspirin EC 81 MG tablet Take 81 mg by mouth daily. Swallow whole.   Blood Glucose Monitoring Suppl (ONE TOUCH ULTRA 2) w/Device KIT Test 3x a day   buPROPion (WELLBUTRIN XL) 150 MG 24 hr tablet Take 1 tablet (150 mg total) by mouth daily.   glucose blood (ONETOUCH ULTRA) test strip CHECK GLUCOSE ONCE DAILY   glucose blood test strip Use as instructed   lisinopril-hydrochlorothiazide (ZESTORETIC) 20-25 MG tablet Take 1 tablet by mouth daily.   meclizine (ANTIVERT) 25 MG tablet Take 1 tablet (25 mg total) by mouth 3 (three) times daily as needed for dizziness.   metFORMIN (GLUCOPHAGE) 1000 MG tablet Take 1 tablet (1,000 mg total) by mouth 2 (two) times daily with a meal.   Multiple Vitamin (MULTIVITAMIN) tablet Take 1 tablet by mouth daily.   NYSTATIN powder APPLY  POWDER TOPICALLY 4 TIMES DAILY   omeprazole (PRILOSEC) 20 MG capsule Take 2 capsules (40 mg total) by mouth daily.   ondansetron (ZOFRAN-ODT) 8 MG disintegrating tablet DISSOLVE 1 TABLET IN MOUTH EVERY 8 HOURS AS NEEDED FOR NAUSEA OR VOMITING   OneTouch Delica Lancets 85U MISC Test 3x a day   rosuvastatin (CRESTOR) 10 MG tablet Take 1 tablet (10 mg total) by mouth daily.   TRULICITY 4.5 DJ/4.9FW SOPN Inject 4.5 mg into the skin once a week.   No facility-administered encounter medications on file as of 04/03/2022.   BP Readings from Last 3 Encounters:  01/20/22 106/67  11/05/21 135/71  10/18/21 125/75    Pulse Readings from Last 3 Encounters:  01/20/22 75  11/05/21 77  10/18/21 93    Lab Results  Component Value Date/Time   HGBA1C 7.4 (H) 01/20/2022 10:00 AM   HGBA1C 6.8 (H) 10/18/2021 09:59 AM   HGBA1C 7.5 11/28/2015 12:00 AM   Lab Results  Component Value  Date   CREATININE 1.12 (H) 10/18/2021   BUN 25 10/18/2021   GFRNONAA 42 (L) 04/04/2020   GFRAA 48 (L) 04/04/2020   NA 137 10/18/2021   K 4.6 10/18/2021   CALCIUM 9.7 10/18/2021   CO2 23 10/18/2021     Last adherence delivery date:N/a  Patient is due for next adherence delivery on: 04/15/22  Spoke with patient on 04/08/22 reviewed medications and coordinated delivery.  This delivery to include: Vials  90 Days    Patient declined the following medications this month:N/a  No refill request needed.  Confirmed delivery date of 04/15/22, advised patient that pharmacy will contact them the morning of delivery.   Any concerns about your medications? No  How often do you forget or accidentally miss a dose? Never  Do you use a pillbox? Yes  Is patient in packaging No  If yes  What is the date on your next pill pack?  Any concerns or issues with your packaging?   Recent blood pressure readings are as follows:106/67   Cycle dispensing form sent to [facility] for review.   Corrie Mckusick, RMA

## 2022-04-09 ENCOUNTER — Telehealth: Payer: Self-pay

## 2022-04-09 NOTE — Progress Notes (Cosign Needed)
Called and left message for patient to return call in reference to 2024 PAP enrollment for Trulicity that was mailed out to patient 04/02/22.  Ethelene Hal

## 2022-04-22 ENCOUNTER — Ambulatory Visit: Payer: Medicare Other | Admitting: Family Medicine

## 2022-04-23 ENCOUNTER — Ambulatory Visit (INDEPENDENT_AMBULATORY_CARE_PROVIDER_SITE_OTHER): Payer: Medicare Other | Admitting: Family Medicine

## 2022-04-23 ENCOUNTER — Encounter: Payer: Self-pay | Admitting: Family Medicine

## 2022-04-23 VITALS — BP 132/68 | HR 70 | Temp 98.6°F | Ht 66.0 in | Wt 149.9 lb

## 2022-04-23 DIAGNOSIS — E1122 Type 2 diabetes mellitus with diabetic chronic kidney disease: Secondary | ICD-10-CM

## 2022-04-23 DIAGNOSIS — I129 Hypertensive chronic kidney disease with stage 1 through stage 4 chronic kidney disease, or unspecified chronic kidney disease: Secondary | ICD-10-CM

## 2022-04-23 DIAGNOSIS — D692 Other nonthrombocytopenic purpura: Secondary | ICD-10-CM

## 2022-04-23 DIAGNOSIS — Z1211 Encounter for screening for malignant neoplasm of colon: Secondary | ICD-10-CM

## 2022-04-23 DIAGNOSIS — F3341 Major depressive disorder, recurrent, in partial remission: Secondary | ICD-10-CM

## 2022-04-23 DIAGNOSIS — Z0000 Encounter for general adult medical examination without abnormal findings: Secondary | ICD-10-CM | POA: Diagnosis not present

## 2022-04-23 DIAGNOSIS — E782 Mixed hyperlipidemia: Secondary | ICD-10-CM

## 2022-04-23 DIAGNOSIS — N182 Chronic kidney disease, stage 2 (mild): Secondary | ICD-10-CM

## 2022-04-23 DIAGNOSIS — L602 Onychogryphosis: Secondary | ICD-10-CM | POA: Diagnosis not present

## 2022-04-23 LAB — MICROSCOPIC EXAMINATION: Bacteria, UA: NONE SEEN

## 2022-04-23 LAB — URINALYSIS, ROUTINE W REFLEX MICROSCOPIC
Bilirubin, UA: NEGATIVE
Glucose, UA: NEGATIVE
Ketones, UA: NEGATIVE
Nitrite, UA: NEGATIVE
Protein,UA: NEGATIVE
RBC, UA: NEGATIVE
Specific Gravity, UA: 1.015 (ref 1.005–1.030)
Urobilinogen, Ur: 1 mg/dL (ref 0.2–1.0)
pH, UA: 7.5 (ref 5.0–7.5)

## 2022-04-23 LAB — MICROALBUMIN, URINE WAIVED
Creatinine, Urine Waived: 50 mg/dL (ref 10–300)
Microalb, Ur Waived: 30 mg/L — ABNORMAL HIGH (ref 0–19)

## 2022-04-23 LAB — BAYER DCA HB A1C WAIVED: HB A1C (BAYER DCA - WAIVED): 7.1 % — ABNORMAL HIGH (ref 4.8–5.6)

## 2022-04-23 NOTE — Progress Notes (Signed)
BP 132/68   Pulse 70   Temp 98.6 F (37 C) (Oral)   Ht 5\' 6"  (1.676 m)   Wt 149 lb 14.4 oz (68 kg)   SpO2 99%   BMI 24.19 kg/m    Subjective:    Patient ID: Alison Hernandez, female    DOB: January 28, 1946, 77 y.o.   MRN: [id number]  HPI: Alison Hernandez is a 77 y.o. female presenting on 04/23/2022 for comprehensive medical examination. Current medical complaints include:  DIABETES Hypoglycemic episodes:no Polydipsia/polyuria: no Visual disturbance: no Chest pain: no Paresthesias: no Glucose Monitoring: no Taking Insulin?: no Blood Pressure Monitoring: not checking Retinal Examination: Up to Date Foot Exam: Up to Date Diabetic Education: Completed Pneumovax: Up to Date Influenza: Up to Date Aspirin: yes  HYPERTENSION / HYPERLIPIDEMIA Satisfied with current treatment? yes Duration of hypertension: chronic BP monitoring frequency: not checking BP medication side effects: no Past BP meds: lisinopril-HCTZ Duration of hyperlipidemia: chronic Cholesterol medication side effects: no Cholesterol supplements: none Past cholesterol medications: atorvastatin Medication compliance: excellent compliance Aspirin: yes Recent stressors: no Recurrent headaches: no Visual changes: no Palpitations: no Dyspnea: no Chest pain: no Lower extremity edema: no Dizzy/lightheaded: no  DEPRESSION Mood status: controlled Satisfied with current treatment?: yes Symptom severity: mild  Duration of current treatment : chronic Side effects: no Medication compliance: excellent compliance Psychotherapy/counseling: no  Previous psychiatric medications: wellbutrin Depressed mood: no Anxious mood: no Anhedonia: no Significant weight loss or gain: no Insomnia: no  Fatigue: no Feelings of worthlessness or guilt: no Impaired concentration/indecisiveness: no Suicidal ideations: no Hopelessness: no Crying spells: no    04/23/2022    9:51 AM 01/20/2022    9:58 AM 11/05/2021    4:09 PM  10/18/2021    9:58 AM 03/26/2021    2:12 PM  Depression screen PHQ 2/9  Decreased Interest 0 0 0 0 0  Down, Depressed, Hopeless 0 0 0 0 0  PHQ - 2 Score 0 0 0 0 0  Altered sleeping 0 0 0 0 0  Tired, decreased energy 0 0 0 0 0  Change in appetite 0 0 0 0 0  Feeling bad or failure about yourself  0 0 0 0 0  Trouble concentrating 0 0 0 0 0  Moving slowly or fidgety/restless 0 0 0 0 0  Suicidal thoughts 0 0 0 0 0  PHQ-9 Score 0 0 0 0 0  Difficult doing work/chores Not difficult at all  Not difficult at all Not difficult at all     She currently lives with: husband, daughter, grandson Menopausal Symptoms: no  Depression Screen done today and results listed below:     04/23/2022    9:51 AM 01/20/2022    9:58 AM 11/05/2021    4:09 PM 10/18/2021    9:58 AM 03/26/2021    2:12 PM  Depression screen PHQ 2/9  Decreased Interest 0 0 0 0 0  Down, Depressed, Hopeless 0 0 0 0 0  PHQ - 2 Score 0 0 0 0 0  Altered sleeping 0 0 0 0 0  Tired, decreased energy 0 0 0 0 0  Change in appetite 0 0 0 0 0  Feeling bad or failure about yourself  0 0 0 0 0  Trouble concentrating 0 0 0 0 0  Moving slowly or fidgety/restless 0 0 0 0 0  Suicidal thoughts 0 0 0 0 0  PHQ-9 Score 0 0 0 0 0  Difficult doing work/chores Not difficult at all  Not difficult at all Not difficult at all     Past Medical History:  Past Medical History:  Diagnosis Date   Arthritis    hands   Diabetes mellitus without complication (HCC)    GERD (gastroesophageal reflux disease)    Hyperlipidemia    Hypertension    TIA (transient ischemic attack)    no deficits   Vertigo     Surgical History:  Past Surgical History:  Procedure Laterality Date   BREAST BIOPSY Left 20+ yrs ago   benign   BREAST BIOPSY Left 20+ yrs ago   benign   CATARACT EXTRACTION W/PHACO Right 05/07/2021   Procedure: CATARACT EXTRACTION PHACO AND INTRAOCULAR LENS PLACEMENT (IOC) RIGHT DIABETIC 19.49 02:17.9;  Surgeon: Galen Manila, MD;  Location:  [facility];  Service: Ophthalmology;  Laterality: Right;  Diabetic   CATARACT EXTRACTION W/PHACO Left 05/21/2021   Procedure: CATARACT EXTRACTION PHACO AND INTRAOCULAR LENS PLACEMENT (IOC) LEFT DIABETIC 18.57 01:42.9 ;  Surgeon: Estanislado Pandy, MD;  Location: [facility];  Service: Ophthalmology;  Laterality: Left;  Diabetic   ROTATOR CUFF REPAIR Right 09/30/2016   SIGMOIDOSCOPY  1990   TOTAL ABDOMINAL HYSTERECTOMY     total    Medications:  Current Outpatient Medications on File Prior to Visit  Medication Sig   aspirin EC 81 MG tablet Take 81 mg by mouth daily. Swallow whole.   Blood Glucose Monitoring Suppl (ONE TOUCH ULTRA 2) w/Device KIT Test 3x a day   buPROPion (WELLBUTRIN XL) 150 MG 24 hr tablet Take 1 tablet (150 mg total) by mouth daily.   glucose blood (ONETOUCH ULTRA) test strip CHECK GLUCOSE ONCE DAILY   glucose blood test strip Use as instructed   lisinopril-hydrochlorothiazide (ZESTORETIC) 20-25 MG tablet Take 1 tablet by mouth daily.   meclizine (ANTIVERT) 25 MG tablet Take 1 tablet (25 mg total) by mouth 3 (three) times daily as needed for dizziness.   metFORMIN (GLUCOPHAGE) 1000 MG tablet Take 1 tablet (1,000 mg total) by mouth 2 (two) times daily with a meal.   Multiple Vitamin (MULTIVITAMIN) tablet Take 1 tablet by mouth daily.   NYSTATIN powder APPLY  POWDER TOPICALLY 4 TIMES DAILY   omeprazole (PRILOSEC) 20 MG capsule Take 2 capsules (40 mg total) by mouth daily.   ondansetron (ZOFRAN-ODT) 8 MG disintegrating tablet DISSOLVE 1 TABLET IN MOUTH EVERY 8 HOURS AS NEEDED FOR NAUSEA OR VOMITING   OneTouch Delica Lancets 33G MISC Test 3x a day   rosuvastatin (CRESTOR) 10 MG tablet Take 1 tablet (10 mg total) by mouth daily.   TRULICITY 4.5 MG/0.5ML SOPN Inject 4.5 mg into the skin once a week.   No current facility-administered medications on file prior to visit.    Allergies:  Allergies  Allergen Reactions   Lovastatin Nausea And Vomiting    Bydureon [Exenatide] Rash   Losartan Rash    Social History:  Social History   Socioeconomic History   Marital status: Married    Spouse name: Not on file   Number of children: Not on file   Years of education: Not on file   Highest education level: Not on file  Occupational History   Not on file  Tobacco Use   Smoking status: Never   Smokeless tobacco: Never  Vaping Use   Vaping Use: Never used  Substance and Sexual Activity   Alcohol use: No   Drug use: No   Sexual activity: Not Currently    Birth control/protection: None  Other Topics  Concern   Not on file  Social History Narrative   Not on file   Social Determinants of Health   Financial Resource Strain: High Risk (11/18/2021)   Overall Financial Resource Strain (CARDIA)    Difficulty of Paying Living Expenses: Hard  Food Insecurity: No Food Insecurity (12/03/2020)   Hunger Vital Sign    Worried About Running Out of Food in the Last Year: Never true    Ran Out of Food in the Last Year: Never true  Transportation Needs: No Transportation Needs (11/18/2021)   PRAPARE - Hydrologist (Medical): No    Lack of Transportation (Non-Medical): No  Physical Activity: Inactive (12/03/2020)   Exercise Vital Sign    Days of Exercise per Week: 0 days    Minutes of Exercise per Session: 0 min  Stress: No Stress Concern Present (12/03/2020)   [place]    Feeling of Stress : Not at all  Social Connections: Unknown (10/26/2020)   Social Connection and Isolation Panel [NHANES]    Frequency of Communication with Friends and Family: More than three times a week    Frequency of Social Gatherings with Friends and Family: More than three times a week    Attends Religious Services: Not on file    Active Member of Clubs or Organizations: Not on file    Attends Archivist Meetings: Not on file    Marital Status: Not on file  Intimate  Partner Violence: Not At Risk (10/26/2020)   Humiliation, Afraid, Rape, and Kick questionnaire    Fear of Current or Ex-Partner: No    Emotionally Abused: No    Physically Abused: No    Sexually Abused: No   Social History   Tobacco Use  Smoking Status Never  Smokeless Tobacco Never   Social History   Substance and Sexual Activity  Alcohol Use No    Family History:  Family History  Problem Relation Age of Onset   Diabetes Mother    Heart disease Mother    Cancer Father        esophagus/ lung   Diabetes Maternal Grandmother    Cancer Sister        Ovarian    Past medical history, surgical history, medications, allergies, family history and social history reviewed with patient today and changes made to appropriate areas of the chart.   Review of Systems  Constitutional: Negative.   HENT: Negative.    Eyes: Negative.   Respiratory:  Positive for cough. Negative for hemoptysis, sputum production, shortness of breath and wheezing.   Cardiovascular: Negative.   Gastrointestinal: Negative.   Genitourinary:  Positive for frequency. Negative for dysuria, flank pain, hematuria and urgency.  Musculoskeletal: Negative.   Skin: Negative.   Neurological: Negative.   Endo/Heme/Allergies:  Positive for environmental allergies. Negative for polydipsia. Does not bruise/bleed easily.  Psychiatric/Behavioral: Negative.     All other ROS negative except what is listed above and in the HPI.      Objective:    BP 132/68   Pulse 70   Temp 98.6 F (37 C) (Oral)   Ht 5\' 6"  (1.676 m)   Wt 149 lb 14.4 oz (68 kg)   SpO2 99%   BMI 24.19 kg/m   Wt Readings from Last 3 Encounters:  04/23/22 149 lb 14.4 oz (68 kg)  01/20/22 151 lb 12.8 oz (68.9 kg)  11/05/21 150 lb 11.2 oz (68.4 kg)  Physical Exam Vitals and nursing note reviewed.  Constitutional:      General: She is not in acute distress.    Appearance: Normal appearance. She is normal weight. She is not ill-appearing,  toxic-appearing or diaphoretic.  HENT:     Head: Normocephalic and atraumatic.     Right Ear: Tympanic membrane, ear canal and external ear normal. There is no impacted cerumen.     Left Ear: Tympanic membrane, ear canal and external ear normal. There is no impacted cerumen.     Nose: Nose normal. No congestion or rhinorrhea.     Mouth/Throat:     Mouth: Mucous membranes are moist.     Pharynx: Oropharynx is clear. No oropharyngeal exudate or posterior oropharyngeal erythema.  Eyes:     General: No scleral icterus.       Right eye: No discharge.        Left eye: No discharge.     Extraocular Movements: Extraocular movements intact.     Conjunctiva/sclera: Conjunctivae normal.     Pupils: Pupils are equal, round, and reactive to light.  Neck:     Vascular: No carotid bruit.  Cardiovascular:     Rate and Rhythm: Normal rate and regular rhythm.     Pulses: Normal pulses.     Heart sounds: No murmur heard.    No friction rub. No gallop.  Pulmonary:     Effort: Pulmonary effort is normal. No respiratory distress.     Breath sounds: Normal breath sounds. No stridor. No wheezing, rhonchi or rales.  Chest:     Chest wall: No tenderness.  Abdominal:     General: Abdomen is flat. Bowel sounds are normal. There is no distension.     Palpations: Abdomen is soft. There is no mass.     Tenderness: There is no abdominal tenderness. There is no right CVA tenderness, left CVA tenderness, guarding or rebound.     Hernia: No hernia is present.  Genitourinary:    Comments: Breast and pelvic exams deferred with shared decision making Musculoskeletal:        General: No swelling, tenderness, deformity or signs of injury.     Cervical back: Normal range of motion and neck supple. No rigidity. No muscular tenderness.     Right lower leg: No edema.     Left lower leg: No edema.  Lymphadenopathy:     Cervical: No cervical adenopathy.  Skin:    General: Skin is warm and dry.     Capillary Refill:  Capillary refill takes less than 2 seconds.     Coloration: Skin is not jaundiced or pale.     Findings: No bruising, erythema, lesion or rash.  Neurological:     General: No focal deficit present.     Mental Status: She is alert and oriented to person, place, and time. Mental status is at baseline.     Cranial Nerves: No cranial nerve deficit.     Sensory: No sensory deficit.     Motor: No weakness.     Coordination: Coordination normal.     Gait: Gait normal.     Deep Tendon Reflexes: Reflexes normal.  Psychiatric:        Mood and Affect: Mood normal.        Behavior: Behavior normal.        Thought Content: Thought content normal.        Judgment: Judgment normal.     Results for orders placed or performed in visit on  04/23/22  Microscopic Examination   BLD  Result Value Ref Range   WBC, UA 0-5 0 - 5 /hpf   RBC, Urine 0-2 0 - 2 /hpf   Epithelial Cells (non renal) 0-10 0 - 10 /hpf   Bacteria, UA None seen None seen/Few  Bayer DCA Hb A1c Waived  Result Value Ref Range   HB A1C (BAYER DCA - WAIVED) 7.1 (H) 4.8 - 5.6 %  Microalbumin, Urine Waived  Result Value Ref Range   Microalb, Ur Waived 30 (H) 0 - 19 mg/L   Creatinine, Urine Waived 50 10 - 300 mg/dL   Microalb/Creat Ratio 30-300 (H) <30 mg/g  Urinalysis, Routine w reflex microscopic  Result Value Ref Range   Specific Gravity, UA 1.015 1.005 - 1.030   pH, UA 7.5 5.0 - 7.5   Color, UA Yellow Yellow   Appearance Ur Clear Clear   Leukocytes,UA Trace (A) Negative   Protein,UA Negative Negative/Trace   Glucose, UA Negative Negative   Ketones, UA Negative Negative   RBC, UA Negative Negative   Bilirubin, UA Negative Negative   Urobilinogen, Ur 1.0 0.2 - 1.0 mg/dL   Nitrite, UA Negative Negative   Microscopic Examination See below:       Assessment & Plan:   Problem List Items Addressed This Visit       Cardiovascular and Mediastinum   Senile purpura ([place])    Reassured patient. Continue to monitor.         Endocrine   Diabetes ([place])    Doing well with A1c of 7.1. Continue current regimen. Continue to monitor. Call with any concerns.       Relevant Orders   Bayer DCA Hb A1c Waived (Completed)   Microalbumin, Urine Waived (Completed)   CBC with Differential/Platelet   Comprehensive metabolic panel     Genitourinary   Benign hypertensive renal disease    Under good control on current regimen. Continue current regimen. Continue to monitor. Call with any concerns. Refills given. Labs drawn today.       Relevant Orders   Microalbumin, Urine Waived (Completed)   CBC with Differential/Platelet   Comprehensive metabolic panel   Urinalysis, Routine w reflex microscopic (Completed)   TSH     Other   Hyperlipidemia    Under good control on current regimen. Continue current regimen. Continue to monitor. Call with any concerns. Refills given. Labs drawn today.      Relevant Orders   CBC with Differential/Platelet   Comprehensive metabolic panel   Lipid Panel w/o Chol/HDL Ratio   Depression    Under good control on current regimen. Continue current regimen. Continue to monitor. Call with any concerns. Refills given.       Other Visit Diagnoses     Routine general medical examination at a health care facility    -  Primary   Vaccines up to date. Screening labs checked today. Mammo and DEXA scheduled. Cologuard ordered today. Continue diet and exercise. Call with any concerns.   Overgrown nail       Referral to podiatry placed today.   Relevant Orders   Ambulatory referral to Podiatry   Screening for colon cancer       Cologuard ordered today.   Relevant Orders   Cologuard        Follow up plan: Return in about 3 months (around 07/22/2022).   LABORATORY TESTING:  - Pap smear: not applicable  IMMUNIZATIONS:   - Tdap: Tetanus vaccination status reviewed: last tetanus  booster within 10 years. - Influenza: Up to date - Pneumovax: Up to date - Prevnar: Up to date - COVID: Up  to date - HPV: Not applicable - Shingrix vaccine: Refused  SCREENING: -Mammogram: scheduled  - Colonoscopy: ordered today  - Bone Density: scheduled    PATIENT COUNSELING:   Advised to take 1 mg of folate supplement per day if capable of pregnancy.   Sexuality: Discussed sexually transmitted diseases, partner selection, use of condoms, avoidance of unintended pregnancy  and contraceptive alternatives.   Advised to avoid cigarette smoking.  I discussed with the patient that most people either abstain from alcohol or drink within safe limits (<=14/week and <=4 drinks/occasion for males, <=7/weeks and <= 3 drinks/occasion for females) and that the risk for alcohol disorders and other health effects rises proportionally with the number of drinks per week and how often a drinker exceeds daily limits.  Discussed cessation/primary prevention of drug use and availability of treatment for abuse.   Diet: Encouraged to adjust caloric intake to maintain  or achieve ideal body weight, to reduce intake of dietary saturated fat and total fat, to limit sodium intake by avoiding high sodium foods and not adding table salt, and to maintain adequate dietary potassium and calcium preferably from fresh fruits, vegetables, and low-fat dairy products.    stressed the importance of regular exercise  Injury prevention: Discussed safety belts, safety helmets, smoke detector, smoking near bedding or upholstery.   Dental health: Discussed importance of regular tooth brushing, flossing, and dental visits.    NEXT PREVENTATIVE PHYSICAL DUE IN 1 YEAR. Return in about 3 months (around 07/22/2022).

## 2022-04-23 NOTE — Assessment & Plan Note (Signed)
Under good control on current regimen. Continue current regimen. Continue to monitor. Call with any concerns. Refills given. Labs drawn today.

## 2022-04-23 NOTE — Assessment & Plan Note (Signed)
Reassured patient. Continue to monitor.  

## 2022-04-23 NOTE — Assessment & Plan Note (Signed)
Under good control on current regimen. Continue current regimen. Continue to monitor. Call with any concerns. Refills given.   

## 2022-04-23 NOTE — Assessment & Plan Note (Signed)
Doing well with A1c of 7.1. Continue current regimen. Continue to monitor. Call with any concerns.

## 2022-04-24 LAB — COMPREHENSIVE METABOLIC PANEL
ALT: 8 IU/L (ref 0–32)
AST: 15 IU/L (ref 0–40)
Albumin/Globulin Ratio: 2.2 (ref 1.2–2.2)
Albumin: 4.4 g/dL (ref 3.8–4.8)
Alkaline Phosphatase: 82 IU/L (ref 44–121)
BUN/Creatinine Ratio: 15 (ref 12–28)
BUN: 18 mg/dL (ref 8–27)
Bilirubin Total: 0.4 mg/dL (ref 0.0–1.2)
CO2: 22 mmol/L (ref 20–29)
Calcium: 9.5 mg/dL (ref 8.7–10.3)
Chloride: 97 mmol/L (ref 96–106)
Creatinine, Ser: 1.2 mg/dL — ABNORMAL HIGH (ref 0.57–1.00)
Globulin, Total: 2 g/dL (ref 1.5–4.5)
Glucose: 122 mg/dL — ABNORMAL HIGH (ref 70–99)
Potassium: 4.8 mmol/L (ref 3.5–5.2)
Sodium: 134 mmol/L (ref 134–144)
Total Protein: 6.4 g/dL (ref 6.0–8.5)
eGFR: 47 mL/min/{1.73_m2} — ABNORMAL LOW (ref >59)

## 2022-04-24 LAB — CBC WITH DIFFERENTIAL/PLATELET
Basophils Absolute: 0 10*3/uL (ref 0.0–0.2)
Basos: 0 %
EOS (ABSOLUTE): 0.1 10*3/uL (ref 0.0–0.4)
Eos: 2 %
Hematocrit: 32.2 % — ABNORMAL LOW (ref 34.0–46.6)
Hemoglobin: 10.5 g/dL — ABNORMAL LOW (ref 11.1–15.9)
Immature Grans (Abs): 0 10*3/uL (ref 0.0–0.1)
Immature Granulocytes: 0 %
Lymphocytes Absolute: 2.1 10*3/uL (ref 0.7–3.1)
Lymphs: 36 %
MCH: 29.5 pg (ref 26.6–33.0)
MCHC: 32.6 g/dL (ref 31.5–35.7)
MCV: 90 fL (ref 79–97)
Monocytes Absolute: 0.4 10*3/uL (ref 0.1–0.9)
Monocytes: 7 %
Neutrophils Absolute: 3.1 10*3/uL (ref 1.4–7.0)
Neutrophils: 55 %
Platelets: 225 10*3/uL (ref 150–450)
RBC: 3.56 x10E6/uL — ABNORMAL LOW (ref 3.77–5.28)
RDW: 12.8 % (ref 11.7–15.4)
WBC: 5.7 10*3/uL (ref 3.4–10.8)

## 2022-04-24 LAB — LIPID PANEL W/O CHOL/HDL RATIO
Cholesterol, Total: 146 mg/dL (ref 100–199)
HDL: 60 mg/dL (ref >39)
LDL Chol Calc (NIH): 68 mg/dL (ref 0–99)
Triglycerides: 98 mg/dL (ref 0–149)
VLDL Cholesterol Cal: 18 mg/dL (ref 5–40)

## 2022-04-24 LAB — TSH: TSH: 2.25 u[IU]/mL (ref 0.450–4.500)

## 2022-05-07 ENCOUNTER — Telehealth: Payer: Self-pay | Admitting: Family Medicine

## 2022-05-07 NOTE — Telephone Encounter (Signed)
Copied from [place] [phone]. Topic: General - Other >> May 07, 2022  1:37 PM Oley Balm E wrote: Reason for CRM: Pt called to report that she only has 2 weeks left of her trulicity and she needs her PCP to fax over her application to [employer]. She says she dropped this off on the 30th when she had her appt. She just spoke to the foundation and they do not have her application on record yet. Waiting on PCP.

## 2022-05-08 ENCOUNTER — Ambulatory Visit: Payer: Medicare Other | Admitting: Podiatry

## 2022-05-08 ENCOUNTER — Telehealth: Payer: Self-pay

## 2022-05-08 NOTE — Progress Notes (Cosign Needed)
Called the patient to let her know that her Trulicity application was resubmitted to [employer]. I will call back on 2/16 to check status. LVM for patient to return call if she has any questions.  Alison Hernandez

## 2022-05-20 ENCOUNTER — Ambulatory Visit
Admission: RE | Admit: 2022-05-20 | Discharge: 2022-05-20 | Disposition: A | Discharge status: Home / Self Care | Payer: Medicare Other | Source: Ambulatory Visit | Attending: Family Medicine | Admitting: Family Medicine

## 2022-05-20 DIAGNOSIS — Z13820 Encounter for screening for osteoporosis: Secondary | ICD-10-CM | POA: Diagnosis not present

## 2022-05-20 DIAGNOSIS — Z1231 Encounter for screening mammogram for malignant neoplasm of breast: Secondary | ICD-10-CM

## 2022-05-20 DIAGNOSIS — M810 Age-related osteoporosis without current pathological fracture: Secondary | ICD-10-CM | POA: Diagnosis not present

## 2022-05-20 DIAGNOSIS — Z780 Asymptomatic menopausal state: Secondary | ICD-10-CM | POA: Insufficient documentation

## 2022-05-22 ENCOUNTER — Other Ambulatory Visit: Payer: Self-pay | Admitting: Family Medicine

## 2022-05-22 DIAGNOSIS — R921 Mammographic calcification found on diagnostic imaging of breast: Secondary | ICD-10-CM

## 2022-05-22 DIAGNOSIS — Z1231 Encounter for screening mammogram for malignant neoplasm of breast: Secondary | ICD-10-CM

## 2022-05-25 ENCOUNTER — Encounter: Payer: Self-pay | Admitting: Family Medicine

## 2022-05-25 ENCOUNTER — Other Ambulatory Visit: Payer: Self-pay | Admitting: Family Medicine

## 2022-05-25 DIAGNOSIS — M810 Age-related osteoporosis without current pathological fracture: Secondary | ICD-10-CM | POA: Insufficient documentation

## 2022-05-25 MED ORDER — ALENDRONATE SODIUM 70 MG PO TABS: 70.0000 mg | ORAL_TABLET | ORAL | 11 refills | Status: DC

## 2022-05-30 ENCOUNTER — Other Ambulatory Visit: Payer: Medicare Other

## 2022-05-30 ENCOUNTER — Inpatient Hospital Stay: Admission: RE | Admit: 2022-05-30 | Payer: Medicare Other | Source: Ambulatory Visit

## 2022-06-03 ENCOUNTER — Ambulatory Visit
Admission: RE | Admit: 2022-06-03 | Discharge: 2022-06-03 | Disposition: A | Discharge status: Home / Self Care | Payer: Medicare Other | Source: Ambulatory Visit | Attending: Family Medicine | Admitting: Family Medicine

## 2022-06-03 DIAGNOSIS — R921 Mammographic calcification found on diagnostic imaging of breast: Secondary | ICD-10-CM

## 2022-06-29 ENCOUNTER — Other Ambulatory Visit: Payer: Self-pay | Admitting: Family Medicine

## 2022-07-01 NOTE — Telephone Encounter (Signed)
Requested Prescriptions  Pending Prescriptions Disp Refills   lisinopril-hydrochlorothiazide (ZESTORETIC) 20-25 MG tablet [Pharmacy Med Name: lisinopril 20 mg-hydrochlorothiazide 25 mg tablet] 90 tablet 0    Sig: TAKE ONE TABLET BY MOUTH EVERY MORNING     Cardiovascular:  ACEI + Diuretic Combos Failed - 06/29/2022  8:03 AM      Failed - Cr in normal range and within 180 days    Creatinine, Ser  Date Value Ref Range Status  04/23/2022 1.20 (H) 0.57 - 1.00 mg/dL Final         Passed - Na in normal range and within 180 days    Sodium  Date Value Ref Range Status  04/23/2022 134 134 - 144 mmol/L Final         Passed - K in normal range and within 180 days    Potassium  Date Value Ref Range Status  04/23/2022 4.8 3.5 - 5.2 mmol/L Final         Passed - eGFR is 30 or above and within 180 days    GFR calc Af Amer  Date Value Ref Range Status  04/04/2020 48 (L) >59 mL/min/1.73 Final    Comment:    **In accordance with recommendations from the NKF-ASN Task force,**   [facility] is in the process of updating its eGFR calculation to the   2021 CKD-EPI creatinine equation that estimates kidney function   without a race variable.    GFR calc non Af Amer  Date Value Ref Range Status  04/04/2020 42 (L) >59 mL/min/1.73 Final   eGFR  Date Value Ref Range Status  04/23/2022 47 (L) >59 mL/min/1.73 Final         Passed - Patient is not pregnant      Passed - Last BP in normal range    BP Readings from Last 1 Encounters:  04/23/22 132/68         Passed - Valid encounter within last 6 months    Recent Outpatient Visits           2 months ago Routine general medical examination at a health care facility   [facility] [place], Megan P, DO   5 months ago Encounter for [employer] annual wellness exam   [facility] Miranda, Megan P, DO   7 months ago Urinary symptom or sign   [facility] [place], [place] T, NP   8  months ago Type 2 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 disease, without long-term current use of insulin (HCC)   [facility] Kirby, Megan P, DO   1 year ago Routine general medical examination at a health care facility   [facility] Dorcas Carrow, DO       Future Appointments             In 3 weeks Dorcas Carrow, DO [facility], [facility]   In 3 weeks Helane Gunther, DPM [place] [facility], [facility]             metFORMIN (GLUCOPHAGE) 1000 MG tablet [Pharmacy Med Name: metformin 1,000 mg tablet] 180 tablet 0    Sig: TAKE ONE TABLET BY MOUTH TWICE DAILY     Endocrinology:  Diabetes - Biguanides Failed - 06/29/2022  8:03 AM      Failed - Cr in normal range and within 360 days    Creatinine, Ser  Date Value Ref Range Status  04/23/2022 1.20 (H) 0.57 - 1.00 mg/dL Final         Failed - eGFR in normal range and within 360 days    GFR calc Af Amer  Date Value Ref Range Status  04/04/2020 48 (L) >59 mL/min/1.73 Final    Comment:    **In accordance with recommendations from the NKF-ASN Task force,**   [facility] is in the process of updating its eGFR calculation to the   2021 CKD-EPI creatinine equation that estimates kidney function   without a race variable.    GFR calc non Af Amer  Date Value Ref Range Status  04/04/2020 42 (L) >59 mL/min/1.73 Final   eGFR  Date Value Ref Range Status  04/23/2022 47 (L) >59 mL/min/1.73 Final         Failed - B12 Level in normal range and within 720 days    No results found for: "VITAMINB12"       Passed - HBA1C is between 0 and 7.9 and within 180 days    Hemoglobin A1C  Date Value Ref Range Status  11/28/2015 7.5  Final   HB A1C (BAYER DCA - WAIVED)  Date Value Ref Range Status  04/23/2022 7.1 (H) 4.8 - 5.6 % Final    Comment:             Prediabetes: 5.7 - 6.4          Diabetes: >6.4          Glycemic control  for adults with diabetes: <7.0          Passed - Valid encounter within last 6 months    Recent Outpatient Visits           2 months ago Routine general medical examination at a health care facility   [facility] [place], Megan P, DO   5 months ago Encounter for [employer] annual wellness exam   [facility] [place], Megan P, DO   7 months ago Urinary symptom or sign   [facility] [place], [place] T, NP   8 months ago Type 2 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 disease, without long-term current use of insulin (HCC)   [place] [facility] [place], Megan P, DO   1 year ago Routine general medical examination at a health care facility   [facility] Dorcas Carrow, DO       Future Appointments             In 3 weeks Dorcas Carrow, DO [facility] [place], [facility]   In 3 weeks Helane Gunther, DPM [place] [facility], [facility]            Passed - CBC within normal limits and completed in the last 12 months    WBC  Date Value Ref Range Status  04/23/2022 5.7 3.4 - 10.8 x10E3/uL Final   RBC  Date Value Ref Range Status  04/23/2022 3.56 (L) 3.77 - 5.28 x10E6/uL Final   Hemoglobin  Date Value Ref Range Status  04/23/2022 10.5 (L) 11.1 - 15.9 g/dL Final   Hematocrit  Date Value Ref Range Status  04/23/2022 32.2 (L) 34.0 - 46.6 % Final   MCHC  Date Value Ref Range Status  04/23/2022 32.6 31.5 - 35.7 g/dL Final   [facility]  Date Value Ref Range Status  04/23/2022 29.5 26.6 -  33.0 pg Final   MCV  Date Value Ref Range Status  04/23/2022 90 79 - 97 fL Final   No results found for: "PLTCOUNTKUC", "LABPLAT", "POCPLA" RDW  Date Value Ref Range Status  04/23/2022 12.8 11.7 - 15.4 % Final          omeprazole (PRILOSEC) 20 MG capsule [Pharmacy Med Name: omeprazole 20 mg capsule,delayed release] 180  capsule 0    Sig: TAKE TWO CAPSULES BY MOUTH EVERY MORNING     Gastroenterology: Proton Pump Inhibitors Passed - 06/29/2022  8:03 AM      Passed - Valid encounter within last 12 months    Recent Outpatient Visits           2 months ago Routine general medical examination at a health care facility   [facility] Johnson, Megan P, DO   5 months ago Encounter for [employer] annual wellness exam   [place] [facility] [place] [place] P, DO   7 months ago Urinary symptom or sign   [facility] [place], [place] T, NP   8 months ago Type 2 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 disease, without long-term current use of insulin (HCC)   [place] [facility] [place], Megan P, DO   1 year ago Routine general medical examination at a health care facility   [facility] [place], [place] Rud[place]egan P, DO       Future Appointments             In 3 weeks Dorcas CarrowJohnson, Megan P, DO [place] [facility], [facility]   In 3 weeks Helane GuntherMayer, Gregory, DPM [place] [facility], [facility]             buPROPion (WELLBUTRIN XL) 150 MG 24 hr tablet [Pharmacy Med Name: bupropion HCl XL 150 mg 24 hr tablet, extended release] 90 tablet 0    Sig: TAKE ONE TABLET BY MOUTH EVERY MORNING     Psychiatry: Antidepressants - bupropion Failed - 06/29/2022  8:03 AM      Failed - Cr in normal range and within 360 days    Creatinine, Ser  Date Value Ref Range Status  04/23/2022 1.20 (H) 0.57 - 1.00 mg/dL Final         Passed - AST in normal range and within 360 days    AST  Date Value Ref Range Status  04/23/2022 15 0 - 40 IU/L Final   AST (SGOT) Piccolo, Waived  Date Value Ref Range Status  03/28/2015 21 11 - 38 U/L Final         Passed - ALT in normal range and within 360 days    ALT  Date Value Ref Range Status  04/23/2022 8 0 - 32 IU/L Final   ALT (SGPT) Piccolo, Waived   Date Value Ref Range Status  03/28/2015 22 10 - 47 U/L Final         Passed - Completed PHQ-2 or PHQ-9 in the last 360 days      Passed - Last BP in normal range    BP Readings from Last 1 Encounters:  04/23/22 132/68         Passed - Valid encounter within last 6 months    Recent Outpatient Visits           2 months ago Routine general medical examination at a health care facility   [facility] Johnson, Megan P, DO   5 months ago Encounter for [employer] annual wellness exam   [facility] [place], Megan P, DO   7 months ago Urinary symptom or sign   [facility] [place], [place] T, NP   8 months ago Type 2 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 disease, without long-term current use of insulin (HCC)   [facility] [place], Megan P, DO   1 year ago Routine general medical examination at a health care facility   [facility] Dorcas Carrow, DO       Future Appointments             In 3 weeks Dorcas Carrow, DO [facility] [facility], [facility]   In 3 weeks Helane Gunther, [facility] [place]  [facility] [place], [place]

## 2022-07-02 ENCOUNTER — Telehealth: Payer: Self-pay

## 2022-07-02 NOTE — Progress Notes (Signed)
Reviewed chart for medication changes ahead of medication coordination call.  Patient obtains medications through 90 Days Vials    Patient is due for next adherence delivery on: 07/14/22  This delivery to include: Bupropion 150 mg Lisinopril-hctz 20-25 mg Meclizine 25 mg Metformin 1000 mg Nystatin powder Ondansetron 8 mg Rosuvastatin 10 mg Onetouch delica lancets Onetouch ultra test strips  Patient needs refills for no refills.   Velvet Bathe

## 2022-07-22 ENCOUNTER — Encounter: Payer: Self-pay | Admitting: Podiatry

## 2022-07-22 ENCOUNTER — Ambulatory Visit (INDEPENDENT_AMBULATORY_CARE_PROVIDER_SITE_OTHER): Payer: Medicare Other | Admitting: Family Medicine

## 2022-07-22 VITALS — BP 138/70 | HR 73 | Temp 98.6°F | Ht 66.0 in | Wt 154.6 lb

## 2022-07-22 DIAGNOSIS — E1122 Type 2 diabetes mellitus with diabetic chronic kidney disease: Secondary | ICD-10-CM | POA: Diagnosis not present

## 2022-07-22 DIAGNOSIS — N182 Chronic kidney disease, stage 2 (mild): Secondary | ICD-10-CM

## 2022-07-22 LAB — BAYER DCA HB A1C WAIVED: HB A1C (BAYER DCA - WAIVED): 7.2 % — ABNORMAL HIGH (ref 4.8–5.6)

## 2022-07-22 MED ORDER — ONDANSETRON 8 MG PO TBDP: ORAL_TABLET | ORAL | 4 refills | Status: DC

## 2022-07-22 MED ORDER — NYSTATIN 100000 UNIT/GM EX POWD: CUTANEOUS | 0 refills | Status: DC

## 2022-07-22 MED ORDER — ROSUVASTATIN CALCIUM 10 MG PO TABS: 10.0000 mg | ORAL_TABLET | Freq: Every day | ORAL | 1 refills | Status: DC

## 2022-07-22 MED ORDER — ALENDRONATE SODIUM 70 MG PO TABS: 70.0000 mg | ORAL_TABLET | ORAL | 11 refills | Status: DC

## 2022-07-22 MED ORDER — DICLOFENAC SODIUM 1 % EX GEL: 4.0000 g | Freq: Four times a day (QID) | CUTANEOUS | 2 refills | Status: DC

## 2022-07-22 NOTE — Assessment & Plan Note (Signed)
Doing well with A1c of 7.2. Will continue current regimen. Recheck 3 months. Call with any concerns.

## 2022-07-22 NOTE — Progress Notes (Signed)
BP 138/70 (BP Location: Left Arm, Cuff Size: Normal)   Pulse 73   Temp 98.6 F (37 C) (Oral)   Ht 5\' 6"  (1.676 m)   Wt 154 lb 9.6 oz (70.1 kg)   SpO2 99%   BMI 24.95 kg/m    Subjective:    Patient ID: Alison Hernandez, female    DOB: 03/18/1946, 77 y.o.   MRN: [id number]  HPI: Alison Hernandez is a 77 y.o. female  Chief Complaint  Patient presents with   Diabetes    Patient most recent Diabetic Eye Exam was requested at today's visit.   DIABETES Hypoglycemic episodes:no Polydipsia/polyuria: no Visual disturbance: no Chest pain: no Paresthesias: no Glucose Monitoring: no  Accucheck frequency:  occasionally Taking Insulin?: no Blood Pressure Monitoring: not checking Retinal Examination: Up to Date Foot Exam: Up to Date Diabetic Education: Completed Pneumovax: Up to Date Influenza: Up to Date Aspirin: yes   Relevant past medical, surgical, family and social history reviewed and updated as indicated. Interim medical history since our last visit reviewed. Allergies and medications reviewed and updated.  Review of Systems  Constitutional: Negative.   Respiratory: Negative.    Cardiovascular: Negative.   Gastrointestinal: Negative.   Musculoskeletal: Negative.   Neurological: Negative.   Psychiatric/Behavioral: Negative.      Per HPI unless specifically indicated above     Objective:    BP 138/70 (BP Location: Left Arm, Cuff Size: Normal)   Pulse 73   Temp 98.6 F (37 C) (Oral)   Ht 5\' 6"  (1.676 m)   Wt 154 lb 9.6 oz (70.1 kg)   SpO2 99%   BMI 24.95 kg/m   Wt Readings from Last 3 Encounters:  07/22/22 154 lb 9.6 oz (70.1 kg)  04/23/22 149 lb 14.4 oz (68 kg)  01/20/22 151 lb 12.8 oz (68.9 kg)    Physical Exam Vitals and nursing note reviewed.  Constitutional:      General: She is not in acute distress.    Appearance: Normal appearance. She is not ill-appearing, toxic-appearing or diaphoretic.  HENT:     Head: Normocephalic and atraumatic.      Right Ear: External ear normal.     Left Ear: External ear normal.     Nose: Nose normal.     Mouth/Throat:     Mouth: Mucous membranes are moist.     Pharynx: Oropharynx is clear.  Eyes:     General: No scleral icterus.       Right eye: No discharge.        Left eye: No discharge.     Extraocular Movements: Extraocular movements intact.     Conjunctiva/sclera: Conjunctivae normal.     Pupils: Pupils are equal, round, and reactive to light.  Cardiovascular:     Rate and Rhythm: Normal rate and regular rhythm.     Pulses: Normal pulses.     Heart sounds: Normal heart sounds. No murmur heard.    No friction rub. No gallop.  Pulmonary:     Effort: Pulmonary effort is normal. No respiratory distress.     Breath sounds: Normal breath sounds. No stridor. No wheezing, rhonchi or rales.  Chest:     Chest wall: No tenderness.  Musculoskeletal:        General: Normal range of motion.     Cervical back: Normal range of motion and neck supple.  Skin:    General: Skin is warm and dry.     Capillary Refill: Capillary refill takes  less than 2 seconds.     Coloration: Skin is not jaundiced or pale.     Findings: No bruising, erythema, lesion or rash.  Neurological:     General: No focal deficit present.     Mental Status: She is alert and oriented to person, place, and time. Mental status is at baseline.  Psychiatric:        Mood and Affect: Mood normal.        Behavior: Behavior normal.        Thought Content: Thought content normal.        Judgment: Judgment normal.     Results for orders placed or performed in visit on 04/23/22  Microscopic Examination   BLD  Result Value Ref Range   WBC, UA 0-5 0 - 5 /hpf   RBC, Urine 0-2 0 - 2 /hpf   Epithelial Cells (non renal) 0-10 0 - 10 /hpf   Bacteria, UA None seen None seen/Few  Bayer DCA Hb A1c Waived  Result Value Ref Range   HB A1C (BAYER DCA - WAIVED) 7.1 (H) 4.8 - 5.6 %  Microalbumin, Urine Waived  Result Value Ref Range    Microalb, Ur Waived 30 (H) 0 - 19 mg/L   Creatinine, Urine Waived 50 10 - 300 mg/dL   Microalb/Creat Ratio 30-300 (H) <30 mg/g  CBC with Differential/Platelet  Result Value Ref Range   WBC 5.7 3.4 - 10.8 x10E3/uL   RBC 3.56 (L) 3.77 - 5.28 x10E6/uL   Hemoglobin 10.5 (L) 11.1 - 15.9 g/dL   Hematocrit 81.1 (L) 91.4 - 46.6 %   MCV 90 79 - 97 fL   MCH 29.5 26.6 - 33.0 pg   MCHC 32.6 31.5 - 35.7 g/dL   RDW 78.2 95.6 - 21.3 %   Platelets 225 150 - 450 x10E3/uL   Neutrophils 55 Not Estab. %   Lymphs 36 Not Estab. %   Monocytes 7 Not Estab. %   Eos 2 Not Estab. %   Basos 0 Not Estab. %   Neutrophils Absolute 3.1 1.4 - 7.0 x10E3/uL   Lymphocytes Absolute 2.1 0.7 - 3.1 x10E3/uL   Monocytes Absolute 0.4 0.1 - 0.9 x10E3/uL   EOS (ABSOLUTE) 0.1 0.0 - 0.4 x10E3/uL   Basophils Absolute 0.0 0.0 - 0.2 x10E3/uL   Immature Granulocytes 0 Not Estab. %   Immature Grans (Abs) 0.0 0.0 - 0.1 x10E3/uL  Comprehensive metabolic panel  Result Value Ref Range   Glucose 122 (H) 70 - 99 mg/dL   BUN 18 8 - 27 mg/dL   Creatinine, Ser 0.86 (H) 0.57 - 1.00 mg/dL   eGFR 47 (L) >57 QI/ONG/2.95   BUN/Creatinine Ratio 15 12 - 28   Sodium 134 134 - 144 mmol/L   Potassium 4.8 3.5 - 5.2 mmol/L   Chloride 97 96 - 106 mmol/L   CO2 22 20 - 29 mmol/L   Calcium 9.5 8.7 - 10.3 mg/dL   Total Protein 6.4 6.0 - 8.5 g/dL   Albumin 4.4 3.8 - 4.8 g/dL   Globulin, Total 2.0 1.5 - 4.5 g/dL   Albumin/Globulin Ratio 2.2 1.2 - 2.2   Bilirubin Total 0.4 0.0 - 1.2 mg/dL   Alkaline Phosphatase 82 44 - 121 IU/L   AST 15 0 - 40 IU/L   ALT 8 0 - 32 IU/L  Lipid Panel w/o Chol/HDL Ratio  Result Value Ref Range   Cholesterol, Total 146 100 - 199 mg/dL   Triglycerides 98 0 - 149 mg/dL  HDL 60 >39 mg/dL   VLDL Cholesterol Cal 18 5 - 40 mg/dL   LDL Chol Calc (NIH) 68 0 - 99 mg/dL  Urinalysis, Routine w reflex microscopic  Result Value Ref Range   Specific Gravity, UA 1.015 1.005 - 1.030   pH, UA 7.5 5.0 - 7.5   Color, UA Yellow  Yellow   Appearance Ur Clear Clear   Leukocytes,UA Trace (A) Negative   Protein,UA Negative Negative/Trace   Glucose, UA Negative Negative   Ketones, UA Negative Negative   RBC, UA Negative Negative   Bilirubin, UA Negative Negative   Urobilinogen, Ur 1.0 0.2 - 1.0 mg/dL   Nitrite, UA Negative Negative   Microscopic Examination See below:   TSH  Result Value Ref Range   TSH 2.250 0.450 - 4.500 uIU/mL      Assessment & Plan:   Problem List Items Addressed This Visit       Endocrine   Diabetes (HCC) - Primary    Doing well with A1c of 7.2. Will continue current regimen. Recheck 3 months. Call with any concerns.       Relevant Medications   rosuvastatin (CRESTOR) 10 MG tablet   Other Relevant Orders   Bayer DCA Hb A1c Waived     Follow up plan: Return in about 3 months (around 10/21/2022).

## 2022-07-24 ENCOUNTER — Ambulatory Visit: Payer: Medicare Other | Admitting: Podiatry

## 2022-07-31 ENCOUNTER — Telehealth: Payer: Self-pay

## 2022-07-31 NOTE — Progress Notes (Cosign Needed)
Care Management & Coordination Services Pharmacy Team  Reason for Encounter: Medication coordination and delivery  Contacted patient to discuss medications and coordinate delivery from [facility]. Unsuccessful outreach. Left voicemail for patient to return call.  Cycle dispensing form sent to Artelia Laroche & Emilio Aspen for review.   Last adherence delivery date: 07/02/22   Patient is due for next adherence delivery on: 08/12/22  This delivery to include: Vials  90 Days  Lisinopril-HCTZ 20-25 MG take 1 tablet daily Omeprazole 20 mg take 1 tablet twice daily Bupropion XL 150 mg take 1 tablet daily Metformin 1,000 mg take 1 tablet twice daily Rosuvastatin 10 mg  take 1 tablet at bedtime Onetouch ultratest strips 3x daily Onetouch delica lancets 3x daily Nystatin powder as needed Zofran 8 mg 1 every 8 hours as needed   Medications: Outpatient Encounter Medications as of 07/31/2022  Medication Sig   alendronate (FOSAMAX) 70 MG tablet Take 1 tablet (70 mg total) by mouth every 7 (seven) days. Take with a full glass of water on an empty stomach.   aspirin EC 81 MG tablet Take 81 mg by mouth daily. Swallow whole.   Blood Glucose Monitoring Suppl (ONE TOUCH ULTRA 2) w/Device KIT Test 3x a day   buPROPion (WELLBUTRIN XL) 150 MG 24 hr tablet TAKE ONE TABLET BY MOUTH EVERY MORNING   diclofenac Sodium (VOLTAREN) 1 % GEL Apply 4 g topically 4 (four) times daily.   glucose blood (ONETOUCH ULTRA) test strip CHECK GLUCOSE ONCE DAILY   glucose blood test strip Use as instructed   lisinopril-hydrochlorothiazide (ZESTORETIC) 20-25 MG tablet TAKE ONE TABLET BY MOUTH EVERY MORNING   meclizine (ANTIVERT) 25 MG tablet Take 1 tablet (25 mg total) by mouth 3 (three) times daily as needed for dizziness.   metFORMIN (GLUCOPHAGE) 1000 MG tablet TAKE ONE TABLET BY MOUTH TWICE DAILY   Multiple Vitamin (MULTIVITAMIN) tablet Take 1 tablet by mouth daily.   nystatin powder APPLY  POWDER TOPICALLY 4  TIMES DAILY   omeprazole (PRILOSEC) 20 MG capsule TAKE TWO CAPSULES BY MOUTH EVERY MORNING   ondansetron (ZOFRAN-ODT) 8 MG disintegrating tablet DISSOLVE 1 TABLET IN MOUTH EVERY 8 HOURS AS NEEDED FOR NAUSEA OR VOMITING   OneTouch Delica Lancets 33G MISC Test 3x a day   rosuvastatin (CRESTOR) 10 MG tablet Take 1 tablet (10 mg total) by mouth daily.   TRULICITY 4.5 MG/0.5ML SOPN Inject 4.5 mg into the skin once a week.   No facility-administered encounter medications on file as of 07/31/2022.   BP Readings from Last 3 Encounters:  07/22/22 138/70  04/23/22 132/68  01/20/22 106/67    Pulse Readings from Last 3 Encounters:  07/22/22 73  04/23/22 70  01/20/22 75    Lab Results  Component Value Date/Time   HGBA1C 7.2 (H) 07/22/2022 09:33 AM   HGBA1C 7.1 (H) 04/23/2022 10:12 AM   HGBA1C 7.5 11/28/2015 12:00 AM   Lab Results  Component Value Date   CREATININE 1.20 (H) 04/23/2022   BUN 18 04/23/2022   GFRNONAA 42 (L) 04/04/2020   GFRAA 48 (L) 04/04/2020   NA 134 04/23/2022   K 4.8 04/23/2022   CALCIUM 9.5 04/23/2022   CO2 22 04/23/2022    Myriam Estrada, RMA

## 2022-08-07 ENCOUNTER — Other Ambulatory Visit: Payer: Self-pay | Admitting: Family Medicine

## 2022-08-07 ENCOUNTER — Ambulatory Visit: Payer: Medicare Other | Admitting: Podiatry

## 2022-08-07 NOTE — Telephone Encounter (Signed)
Rx- all filled 07/01/22 for 90 day supply- too soon Requested Prescriptions  Pending Prescriptions Disp Refills   lisinopril-hydrochlorothiazide (ZESTORETIC) 20-25 MG tablet [Pharmacy Med Name: lisinopril 20 mg-hydrochlorothiazide 25 mg tablet] 90 tablet 0    Sig: TAKE ONE TABLET BY MOUTH EVERY MORNING     Cardiovascular:  ACEI + Diuretic Combos Failed - 08/07/2022  3:18 PM      Failed - Cr in normal range and within 180 days    Creatinine, Ser  Date Value Ref Range Status  04/23/2022 1.20 (H) 0.57 - 1.00 mg/dL Final         Passed - Na in normal range and within 180 days    Sodium  Date Value Ref Range Status  04/23/2022 134 134 - 144 mmol/L Final         Passed - K in normal range and within 180 days    Potassium  Date Value Ref Range Status  04/23/2022 4.8 3.5 - 5.2 mmol/L Final         Passed - eGFR is 30 or above and within 180 days    GFR calc Af Amer  Date Value Ref Range Status  04/04/2020 48 (L) >59 mL/min/1.73 Final    Comment:    **In accordance with recommendations from the NKF-ASN Task force,**   [facility] is in the process of updating its eGFR calculation to the   2021 CKD-EPI creatinine equation that estimates kidney function   without a race variable.    GFR calc non Af Amer  Date Value Ref Range Status  04/04/2020 42 (L) >59 mL/min/1.73 Final   eGFR  Date Value Ref Range Status  04/23/2022 47 (L) >59 mL/min/1.73 Final         Passed - Patient is not pregnant      Passed - Last BP in normal range    BP Readings from Last 1 Encounters:  07/22/22 138/70         Passed - Valid encounter within last 6 months    Recent Outpatient Visits           2 weeks ago Type 2 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 disease, without long-term current use of insulin (HCC)   [facility] [place], Megan P, DO   3 months ago Routine general medical examination at a health care facility   [facility] [place],  Megan P, DO   6 months ago Encounter for [employer] annual wellness exam   [place] [place], Megan P, DO   9 months ago Urinary symptom or sign   [place] [facility] [place], [place] T, NP   9 months ago Type 2 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 disease, without long-term current use of insulin (HCC)   [place] [facility] [place], Oralia Rud, DO       Future Appointments             In 1 week Allena Katz Gillie Manners, DPM [place] [facility] [place], [facility]   In 2 months Laural Benes, Oralia Rud, DO [place] [facility], [facility]             omeprazole (PRILOSEC) 20 MG capsule [Pharmacy Med Name: omeprazole 20 mg capsule,delayed release] 180 capsule 0    Sig: TAKE TWO CAPSULES BY MOUTH EVERY MORNING     Gastroenterology: Proton Pump Inhibitors Passed - 08/07/2022  3:18 PM  Passed - Valid encounter within last 12 months    Recent Outpatient Visits           2 weeks ago Type 2 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 disease, without long-term current use of insulin (HCC)   [facility] [place], Megan P, DO   3 months ago Routine general medical examination at a health care facility   [facility], [facility] P, DO   6 months ago Encounter for [employer] annual wellness exam   [place] [place], Megan P, DO   9 months ago Urinary symptom or sign   [place] [facility] [place], Corrie Dandy T, NP   9 months ago Type 2 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 disease, without long-term current use of insulin (HCC)   [place] [facility] [place], [place], DO       Future Appointments             In 1 week Candelaria Stagers, DPM [place] [facility], [facility]   In 2 months Laural Benes, Megan P, DO [place] [facility], [facility]              buPROPion (WELLBUTRIN XL) 150 MG 24 hr tablet [Pharmacy Med Name: bupropion HCl XL 150 mg 24 hr tablet, extended release] 90 tablet 0    Sig: TAKE ONE TABLET BY MOUTH EVERY MORNING     Psychiatry: Antidepressants - bupropion Failed - 08/07/2022  3:18 PM      Failed - Cr in normal range and within 360 days    Creatinine, Ser  Date Value Ref Range Status  04/23/2022 1.20 (H) 0.57 - 1.00 mg/dL Final         Passed - AST in normal range and within 360 days    AST  Date Value Ref Range Status  04/23/2022 15 0 - 40 IU/L Final   AST (SGOT) Piccolo, Waived  Date Value Ref Range Status  03/28/2015 21 11 - 38 U/L Final         Passed - ALT in normal range and within 360 days    ALT  Date Value Ref Range Status  04/23/2022 8 0 - 32 IU/L Final   ALT (SGPT) Piccolo, Waived  Date Value Ref Range Status  03/28/2015 22 10 - 47 U/L Final         Passed - Completed PHQ-2 or PHQ-9 in the last 360 days      Passed - Last BP in normal range    BP Readings from Last 1 Encounters:  07/22/22 138/70         Passed - Valid encounter within last 6 months    Recent Outpatient Visits           2 weeks ago Type 2 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 disease, without long-term current use of insulin (HCC)   [facility] [place], Megan P, DO   3 months ago Routine general medical examination at a health care facility   [facility] [place], [facility] P, DO   6 months ago Encounter for [employer] annual wellness exam   [place] [facility] [place], Megan P, DO   9 months ago Urinary symptom or sign   [facility] [place], [place] T, NP   9 months ago Type 2 diabetes mellitus with stage  2 chronic kidney disease, without long-term current use of insulin [facility])   [place] [place], Oralia Rud, DO       Future Appointments             In 1 week Allena Katz, Gillie Manners, DPM [facility] [place], [place]   In 2 months Laural Benes, Oralia Rud, DO [place] [facility], [facility]

## 2022-08-14 ENCOUNTER — Ambulatory Visit: Payer: Medicare Other | Admitting: Podiatry

## 2022-09-01 ENCOUNTER — Telehealth: Payer: Self-pay

## 2022-09-01 NOTE — Progress Notes (Incomplete)
Care Management & Coordination Services Pharmacy Team  Reason for Encounter: Medication coordination and delivery  Contacted patient to discuss medications and coordinate delivery from [facility]. {US HC Outreach:28874}  C[facility] dispensing form sent to Artelia Laroche & Emilio Aspen for review.    Medications: Outpatient Encounter Medications as of 09/01/2022  Medication Sig   alendronate (FOSAMAX) 70 MG tablet Take 1 tablet (70 mg total) by mouth every 7 (seven) days. Take with a full glass of water on an empty stomach.   aspirin EC 81 MG tablet Take 81 mg by mouth daily. Swallow whole.   Blood Glucose Monitoring Suppl (ONE TOUCH ULTRA 2) w/Device KIT Test 3x a day   buPROPion (WELLBUTRIN XL) 150 MG 24 hr tablet TAKE ONE TABLET BY MOUTH EVERY MORNING   diclofenac Sodium (VOLTAREN) 1 % GEL Apply 4 g topically 4 (four) times daily.   glucose blood (ONETOUCH ULTRA) test strip CHECK GLUCOSE ONCE DAILY   glucose blood test strip Use as instructed   lisinopril-hydrochlorothiazide (ZESTORETIC) 20-25 MG tablet TAKE ONE TABLET BY MOUTH EVERY MORNING   meclizine (ANTIVERT) 25 MG tablet Take 1 tablet (25 mg total) by mouth 3 (three) times daily as needed for dizziness.   metFORMIN (GLUCOPHAGE) 1000 MG tablet TAKE ONE TABLET BY MOUTH TWICE DAILY   Multiple Vitamin (MULTIVITAMIN) tablet Take 1 tablet by mouth daily.   nystatin powder APPLY  POWDER TOPICALLY 4 TIMES DAILY   omeprazole (PRILOSEC) 20 MG capsule TAKE TWO CAPSULES BY MOUTH EVERY MORNING   ondansetron (ZOFRAN-ODT) 8 MG disintegrating tablet DISSOLVE 1 TABLET IN MOUTH EVERY 8 HOURS AS NEEDED FOR NAUSEA OR VOMITING   OneTouch Delica Lancets 33G MISC Test 3x a day   rosuvastatin (CRESTOR) 10 MG tablet Take 1 tablet (10 mg total) by mouth daily.   TRULICITY 4.5 MG/0.5ML SOPN Inject 4.5 mg into the skin once a week.   No facility-administered encounter medications on file as of 09/01/2022.   BP Readings from Last 3 Encounters:   07/22/22 138/70  04/23/22 132/68  01/20/22 106/67    Pulse Readings from Last 3 Encounters:  07/22/22 73  04/23/22 70  01/20/22 75    Lab Results  Component Value Date/Time   HGBA1C 7.2 (H) 07/22/2022 09:33 AM   HGBA1C 7.1 (H) 04/23/2022 10:12 AM   HGBA1C 7.5 11/28/2015 12:00 AM   Lab Results  Component Value Date   CREATININE 1.20 (H) 04/23/2022   BUN 18 04/23/2022   GFRNONAA 42 (L) 04/04/2020   GFRAA 48 (L) 04/04/2020   NA 134 04/23/2022   K 4.8 04/23/2022   CALCIUM 9.5 04/23/2022   CO2 22 04/23/2022   Last adherence delivery date:07/31/22  Patient is due for next adherence delivery on: 09/11/22  This delivery to include: Vials  90 Days    Patient declined the following medications this month:   {refills needed:25320}  {Delivery ZHYQ:65784}   Any concerns about your medications? {yes/no:20286}  How often do you forget or accidentally miss a dose? {Missed doses:25554}  Do you use a pillbox? {yes/no:20286}  Is patient in packaging: No  If yes  What is the date on your next pill pack?  Any concerns or issues with your packaging?    Rance Muir, RMA

## 2022-09-09 ENCOUNTER — Other Ambulatory Visit: Payer: Self-pay | Admitting: Family Medicine

## 2022-09-09 NOTE — Telephone Encounter (Signed)
Requested medication (s) are due for refill today - yes  Requested medication (s) are on the active medication list - yes  Future visit scheduled -yes  Last refill: 07/22/22 15g  Notes to clinic: off protocol- provider review   Requested Prescriptions  Pending Prescriptions Disp Refills   nystatin (MYCOSTATIN/NYSTOP) powder [Pharmacy Med Name: nystatin 100,000 unit/gram topical powder] 15 g 0    Sig: APPLY TOPICALLY FOUR TIMES DAILY AS DIRECTED     Off-Protocol Failed - 09/09/2022  9:32 AM      Failed - Medication not assigned to a protocol, review manually.      Passed - Valid encounter within last 12 months    Recent Outpatient Visits           1 month ago Type 2 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 disease, without long-term current use of insulin (HCC)   [place] [facility] [place], Megan P, DO   4 months ago Routine general medical examination at a health care facility   [facility], [facility] P, DO   7 months ago Encounter for [employer] annual wellness exam   [place] [facility] [place], Megan P, DO   10 months ago Urinary symptom or sign   [place] [facility] [place], [place] T, NP   10 months ago Type 2 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 disease, without long-term current use of insulin (HCC)   [place] [facility] [place], [place], DO       Future Appointments             In 1 month Johnson, Oralia Rud, DO [place] [facility], [facility]               Requested Prescriptions  Pending Prescriptions Disp Refills   nystatin (MYCOSTATIN/NYSTOP) powder [Pharmacy Med Name: nystatin 100,000 unit/gram topical powder] 15 g 0    Sig: APPLY TOPICALLY FOUR TIMES DAILY AS DIRECTED     Off-Protocol Failed - 09/09/2022  9:32 AM      Failed - Medication not assigned to a protocol, review manually.      Passed - Valid encounter within last 12 months     Recent Outpatient Visits           1 month ago Type 2 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 disease, without long-term current use of insulin (HCC)   [place] [facility] [place], Megan P, DO   4 months ago Routine general medical examination at a health care facility   [facility], [facility], DO   7 months ago Encounter for [employer] annual wellness exam   [place] [place], Megan P, DO   10 months ago Urinary symptom or sign   [place] [facility] [place], [place] T, NP   10 months ago Type 2 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 disease, without long-term current use of insulin (HCC)   [place] [place], Oralia Rud, DO       Future Appointments             In 1 month Johnson, Oralia Rud, DO  [facility], [facility]

## 2022-10-06 ENCOUNTER — Other Ambulatory Visit: Payer: Self-pay | Admitting: Family Medicine

## 2022-10-07 NOTE — Telephone Encounter (Signed)
Requested Prescriptions  Pending Prescriptions Disp Refills   omeprazole (PRILOSEC) 20 MG capsule [Pharmacy Med Name: omeprazole 20 mg capsule,delayed release] 180 capsule 0    Sig: TAKE TWO CAPSULES BY MOUTH EVERY MORNING     Gastroenterology: Proton Pump Inhibitors Passed - 10/06/2022 12:23 PM      Passed - Valid encounter within last 12 months    Recent Outpatient Visits           2 months ago Type 2 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 disease, without long-term current use of insulin (HCC)   [place] [facility] [place], Megan P, DO   5 months ago Routine general medical examination at a health care facility   [facility], Megan P, DO   8 months ago Encounter for [employer] annual wellness exam   [place] [facility] [place], Megan P, DO   11 months ago Urinary symptom or sign   [place] [facility] [place], [place] T, NP   11 months ago Type 2 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 disease, without long-term current use of insulin (HCC)   [place] [place], [place], DO       Future Appointments             In 2 weeks Laural Benes, Oralia Rud, DO [place] [facility], [facility]             lisinopril-hydrochlorothiazide (ZESTORETIC) 20-25 MG tablet [Pharmacy Med Name: lisinopril 20 mg-hydrochlorothiazide 25 mg tablet] 90 tablet 0    Sig: TAKE ONE TABLET BY MOUTH EVERY MORNING     Cardiovascular:  ACEI + Diuretic Combos Failed - 10/06/2022 12:23 PM      Failed - Cr in normal range and within 180 days    Creatinine, Ser  Date Value Ref Range Status  04/23/2022 1.20 (H) 0.57 - 1.00 mg/dL Final         Passed - Na in normal range and within 180 days    Sodium  Date Value Ref Range Status  04/23/2022 134 134 - 144 mmol/L Final         Passed - K in normal range and within 180 days    Potassium  Date Value Ref Range Status  04/23/2022 4.8 3.5 -  5.2 mmol/L Final         Passed - eGFR is 30 or above and within 180 days    GFR calc Af Amer  Date Value Ref Range Status  04/04/2020 48 (L) >59 mL/min/1.73 Final    Comment:    **In accordance with recommendations from the NKF-ASN Task force,**   [facility] is in the process of updating its eGFR calculation to the   2021 CKD-EPI creatinine equation that estimates kidney function   without a race variable.    GFR calc non Af Amer  Date Value Ref Range Status  04/04/2020 42 (L) >59 mL/min/1.73 Final   eGFR  Date Value Ref Range Status  04/23/2022 47 (L) >59 mL/min/1.73 Final         Passed - Patient is not pregnant      Passed - Last BP in normal range    BP Readings from Last 1 Encounters:  07/22/22 138/70         Passed - Valid encounter within last 6 months    Recent Outpatient Visits           2 months ago  Type 2 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 disease, without long-term current use of insulin (HCC)   [place] [facility] [place], Megan P, DO   5 months ago Routine general medical examination at a health care facility   [facility], Megan P, DO   8 months ago Encounter for [employer] annual wellness exam   [place] [facility] [place], Megan P, DO   11 months ago Urinary symptom or sign   [place] [facility] [place], [place] T, NP   11 months ago Type 2 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 disease, without long-term current use of insulin (HCC)   [facility] [place], [place], DO       Future Appointments             In 2 weeks Laural Benes, Oralia Rud, DO [place] [facility], [facility]             buPROPion (WELLBUTRIN XL) 150 MG 24 hr tablet [Pharmacy Med Name: bupropion HCl XL 150 mg 24 hr tablet, extended release] 90 tablet 0    Sig: TAKE ONE TABLET BY MOUTH EVERY MORNING     Psychiatry: Antidepressants - bupropion Failed -  10/06/2022 12:23 PM      Failed - Cr in normal range and within 360 days    Creatinine, Ser  Date Value Ref Range Status  04/23/2022 1.20 (H) 0.57 - 1.00 mg/dL Final         Passed - AST in normal range and within 360 days    AST  Date Value Ref Range Status  04/23/2022 15 0 - 40 IU/L Final   AST (SGOT) Piccolo, Waived  Date Value Ref Range Status  03/28/2015 21 11 - 38 U/L Final         Passed - ALT in normal range and within 360 days    ALT  Date Value Ref Range Status  04/23/2022 8 0 - 32 IU/L Final   ALT (SGPT) Piccolo, Waived  Date Value Ref Range Status  03/28/2015 22 10 - 47 U/L Final         Passed - Completed PHQ-2 or PHQ-9 in the last 360 days      Passed - Last BP in normal range    BP Readings from Last 1 Encounters:  07/22/22 138/70         Passed - Valid encounter within last 6 months    Recent Outpatient Visits           2 months ago Type 2 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 disease, without long-term current use of insulin (HCC)   [place] [facility] [place], Megan P, DO   5 months ago Routine general medical examination at a health care facility   [facility] [place], Megan P, DO   8 months ago Encounter for [employer] annual wellness exam   [place] [facility] [place], Megan P, DO   11 months ago Urinary symptom or sign   [place] [facility] [place], Rafael Gonzalez T, NP   11 months ago Type 2 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 disease, without long-term current use of insulin [facility] [place])    [facility] [place], Oralia Rud, DO       Future Appointments             In 2 weeks Laural Benes, [place]  P, DO [place] [facility], [facility]             metFORMIN (GLUCOPHAGE) 1000 MG tablet [Pharmacy Med Name: metformin 1,000 mg tablet] 180 tablet 0    Sig: TAKE ONE TABLET BY MOUTH TWICE DAILY     Endocrinology:  Diabetes - Biguanides  Failed - 10/06/2022 12:23 PM      Failed - Cr in normal range and within 360 days    Creatinine, Ser  Date Value Ref Range Status  04/23/2022 1.20 (H) 0.57 - 1.00 mg/dL Final         Failed - eGFR in normal range and within 360 days    GFR calc Af Amer  Date Value Ref Range Status  04/04/2020 48 (L) >59 mL/min/1.73 Final    Comment:    **In accordance with recommendations from the NKF-ASN Task force,**   [facility] is in the process of updating its eGFR calculation to the   2021 CKD-EPI creatinine equation that estimates kidney function   without a race variable.    GFR calc non Af Amer  Date Value Ref Range Status  04/04/2020 42 (L) >59 mL/min/1.73 Final   eGFR  Date Value Ref Range Status  04/23/2022 47 (L) >59 mL/min/1.73 Final         Failed - B12 Level in normal range and within 720 days    No results found for: "VITAMINB12"       Passed - HBA1C is between 0 and 7.9 and within 180 days    Hemoglobin A1C  Date Value Ref Range Status  11/28/2015 7.5  Final   HB A1C (BAYER DCA - WAIVED)  Date Value Ref Range Status  07/22/2022 7.2 (H) 4.8 - 5.6 % Final    Comment:             Prediabetes: 5.7 - 6.4          Diabetes: >6.4          Glycemic control for adults with diabetes: <7.0          Passed - Valid encounter within last 6 months    Recent Outpatient Visits           2 months ago Type 2 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 disease, without long-term current use of insulin (HCC)   [place] [facility] [place], Megan P, DO   5 months ago Routine general medical examination at a health care facility   [facility] [place], Megan P, DO   8 months ago Encounter for [employer] annual wellness exam   [place] [facility] [place], Megan P, DO   11 months ago Urinary symptom or sign   [facility] [place], [place] T, NP   11 months ago Type 2 diabetes mellitus with stage 2 chronic  kidney disease, without long-term current use of insulin (HCC)   [place] [facility] [place] [employer], [place], DO       Future Appointments             In 2 weeks Laural Benes, Megan P, DO  [facility], [facility]            Passed - CBC within normal limits and completed in the last 12 months    WBC  Date Value Ref Range Status  04/23/2022 5.7 3.4 - 10.8 x10E3/uL Final   RBC  Date Value Ref Range Status  04/23/2022 3.56 (  L) 3.77 - 5.28 x10E6/uL Final   Hemoglobin  Date Value Ref Range Status  04/23/2022 10.5 (L) 11.1 - 15.9 g/dL Final   Hematocrit  Date Value Ref Range Status  04/23/2022 32.2 (L) 34.0 - 46.6 % Final   MCHC  Date Value Ref Range Status  04/23/2022 32.6 31.5 - 35.7 g/dL Final   [facility]  Date Value Ref Range Status  04/23/2022 29.5 26.6 - 33.0 pg Final   MCV  Date Value Ref Range Status  04/23/2022 90 79 - 97 fL Final   No results found for: "PLTCOUNTKUC", "LABPLAT", "POCPLA" RDW  Date Value Ref Range Status  04/23/2022 12.8 11.7 - 15.4 % Final

## 2022-10-24 ENCOUNTER — Ambulatory Visit: Payer: Medicare Other | Admitting: Family Medicine

## 2022-11-06 ENCOUNTER — Ambulatory Visit: Payer: Medicare Other | Admitting: Family Medicine

## 2022-11-15 ENCOUNTER — Other Ambulatory Visit: Payer: Self-pay | Admitting: Family Medicine

## 2022-11-17 NOTE — Telephone Encounter (Signed)
Requested medication (s) are due for refill today - unsure  Requested medication (s) are on the active medication list -yes  Future visit scheduled -yes  Last refill: 12/01/21 6ml 1RF  Notes to clinic: Rx comes with severe allergy alert- sent for review   Requested Prescriptions  Pending Prescriptions Disp Refills   TRULICITY 4.5 MG/0.5ML SOPN [Pharmacy Med Name: Trulicity Subcutaneous Solution Pen-injector 4.5 MG/0.5ML] 6 mL 0    Sig: INJECT 4.5MG  (0.5ML) UNDER THE SKIN ONCE A WEEK     Endocrinology:  Diabetes - GLP-1 Receptor Agonists Passed - 11/15/2022  8:00 AM      Passed - HBA1C is between 0 and 7.9 and within 180 days    Hemoglobin A1C  Date Value Ref Range Status  11/28/2015 7.5  Final   HB A1C (BAYER DCA - WAIVED)  Date Value Ref Range Status  07/22/2022 7.2 (H) 4.8 - 5.6 % Final    Comment:             Prediabetes: 5.7 - 6.4          Diabetes: >6.4          Glycemic control for adults with diabetes: <7.0          Passed - Valid encounter within last 6 months    Recent Outpatient Visits           3 months ago Type 2 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 disease, without long-term current use of insulin (HCC)   [facility] [place], Megan P, DO   6 months ago Routine general medical examination at a health care facility   [facility], Megan P, DO   10 months ago Encounter for [employer] annual wellness exam   [facility] [place], [place] P, DO   1 year ago Urinary symptom or sign   [place] [facility] [place], Corrie Dandy T, NP   1 year ago Type 2 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 disease, without long-term current use of insulin (HCC)   [place] [facility] [place], [place], DO       Future Appointments             In 1 month Johnson, Megan P, DO [place] [facility]               Requested Prescriptions   Pending Prescriptions Disp Refills   TRULICITY 4.5 MG/0.5ML SOPN [Pharmacy Med Name: Trulicity Subcutaneous Solution Pen-injector 4.5 MG/0.5ML] 6 mL 0    Sig: INJECT 4.5MG  (0.5ML) UNDER THE SKIN ONCE A WEEK     Endocrinology:  Diabetes - GLP-1 Receptor Agonists Passed - 11/15/2022  8:00 AM      Passed - HBA1C is between 0 and 7.9 and within 180 days    Hemoglobin A1C  Date Value Ref Range Status  11/28/2015 7.5  Final   HB A1C (BAYER DCA - WAIVED)  Date Value Ref Range Status  07/22/2022 7.2 (H) 4.8 - 5.6 % Final    Comment:             Prediabetes: 5.7 - 6.4          Diabetes: >6.4          Glycemic control for adults with diabetes: <7.0          Passed - Valid encounter within last 6 months    Recent Outpatient Visits  3 months ago Type 2 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 disease, without long-term current use of insulin (HCC)   [facility] [place], Megan P, DO   6 months ago Routine general medical examination at a health care facility   [facility], [facility] P, DO   10 months ago Encounter for [employer] annual wellness exam   [place] [place], [place] P, DO   1 year ago Urinary symptom or sign   [facility] [place], Corrie Dandy T, NP   1 year ago Type 2 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 disease, without long-term current use of insulin (HCC)   [place] [facility] [place], Oralia Rud, DO       Future Appointments             In 1 month Johnson, Oralia Rud, DO [place] [facility]

## 2022-11-24 ENCOUNTER — Other Ambulatory Visit: Payer: Self-pay | Admitting: Family Medicine

## 2022-11-26 NOTE — Telephone Encounter (Signed)
Requested Prescriptions  Pending Prescriptions Disp Refills   Blood Glucose Monitoring Suppl (ONE TOUCH ULTRA 2) w/Device KIT [Pharmacy Med Name: ONETOUCH ULTRA 2 METER W/DEVICE Kit] 1 kit 0    Sig: TEST THREE TIMES A DAY     Endocrinology: Diabetes - Testing Supplies Passed - 11/24/2022 10:30 AM      Passed - Valid encounter within last 12 months    Recent Outpatient Visits           4 months ago Type 2 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 disease, without long-term current use of insulin (HCC)   [place] [facility] [place], Megan P, DO   7 months ago Routine general medical examination at a health care facility   [facility], Megan P, DO   10 months ago Encounter for [employer] annual wellness exam   [facility] [place], Megan P, DO   1 year ago Urinary symptom or sign   [place] [facility] [place], Corrie Dandy T, NP   1 year ago Type 2 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 disease, without long-term current use of insulin (HCC)   [facility] [place], [place], DO       Future Appointments             In 1 month Johnson, Megan P, DO [place] [facility]             lisinopril-hydrochlorothiazide (ZESTORETIC) 20-25 MG tablet [Pharmacy Med Name: LISINOPRIL-HCTZ 20-25 MG TA 20-25 Tablet] 90 tablet 0    Sig: TAKE 1 TABLET BY MOUTH EVERY MORNING     Cardiovascular:  ACEI + Diuretic Combos Failed - 11/24/2022 10:30 AM      Failed - Na in normal range and within 180 days    Sodium  Date Value Ref Range Status  04/23/2022 134 134 - 144 mmol/L Final         Failed - K in normal range and within 180 days    Potassium  Date Value Ref Range Status  04/23/2022 4.8 3.5 - 5.2 mmol/L Final         Failed - Cr in normal range and within 180 days    Creatinine, Ser  Date Value Ref Range Status  04/23/2022 1.20 (H) 0.57 - 1.00 mg/dL Final          Failed - eGFR is 30 or above and within 180 days    GFR calc Af Amer  Date Value Ref Range Status  04/04/2020 48 (L) >59 mL/min/1.73 Final    Comment:    **In accordance with recommendations from the NKF-ASN Task force,**   [facility] is in the process of updating its eGFR calculation to the   2021 CKD-EPI creatinine equation that estimates kidney function   without a race variable.    GFR calc non Af Amer  Date Value Ref Range Status  04/04/2020 42 (L) >59 mL/min/1.73 Final   eGFR  Date Value Ref Range Status  04/23/2022 47 (L) >59 mL/min/1.73 Final         Passed - Patient is not pregnant      Passed - Last BP in normal range    BP Readings from Last 1 Encounters:  07/22/22 138/70         Passed - Valid encounter within last 6 months    Recent Outpatient Visits  4 months ago Type 2 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 disease, without long-term current use of insulin (HCC)   [facility] [place], Megan P, DO   7 months ago Routine general medical examination at a health care facility   [facility] [place], Megan P, DO   10 months ago Encounter for [employer] annual wellness exam   [place] [facility] [place], [place] P, DO   1 year ago Urinary symptom or sign   [place] [facility] [place], Corrie Dandy T, NP   1 year ago Type 2 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 disease, without long-term current use of insulin (HCC)   [place] [facility] [place], [place], DO       Future Appointments             In 1 month Johnson, Megan P, DO  [facility]             metFORMIN (GLUCOPHAGE) 1000 MG tablet [Pharmacy Med Name: METFORMIN 1000MG  TABLET Tablet] 180 tablet 0    Sig: TAKE ONE (1) TABLET BY MOUTH TWICE DAILY     Endocrinology:  Diabetes - Biguanides Failed - 11/24/2022 10:30 AM      Failed - Cr in normal range  and within 360 days    Creatinine, Ser  Date Value Ref Range Status  04/23/2022 1.20 (H) 0.57 - 1.00 mg/dL Final         Failed - eGFR in normal range and within 360 days    GFR calc Af Amer  Date Value Ref Range Status  04/04/2020 48 (L) >59 mL/min/1.73 Final    Comment:    **In accordance with recommendations from the NKF-ASN Task force,**   [facility] is in the process of updating its eGFR calculation to the   2021 CKD-EPI creatinine equation that estimates kidney function   without a race variable.    GFR calc non Af Amer  Date Value Ref Range Status  04/04/2020 42 (L) >59 mL/min/1.73 Final   eGFR  Date Value Ref Range Status  04/23/2022 47 (L) >59 mL/min/1.73 Final         Failed - B12 Level in normal range and within 720 days    No results found for: "VITAMINB12"       Passed - HBA1C is between 0 and 7.9 and within 180 days    Hemoglobin A1C  Date Value Ref Range Status  11/28/2015 7.5  Final   HB A1C (BAYER DCA - WAIVED)  Date Value Ref Range Status  07/22/2022 7.2 (H) 4.8 - 5.6 % Final    Comment:             Prediabetes: 5.7 - 6.4          Diabetes: >6.4          Glycemic control for adults with diabetes: <7.0          Passed - Valid encounter within last 6 months    Recent Outpatient Visits           4 months ago Type 2 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 disease, without long-term current use of insulin (HCC)    [facility] [place], Megan P, DO   7 months ago Routine general medical examination at a health care facility   [facility], Megan P, DO   10 months ago Encounter  for Medicare annual wellness exam   [place] [facility] [place], [place] P, DO   1 year ago Urinary symptom or sign   [place] [facility] [place], Corrie Dandy T, NP   1 year ago Type 2 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 disease, without long-term current use of insulin (HCC)   [facility] [place], [place], DO       Future Appointments             In 1 month Johnson, Oralia Rud, DO [place] [facility]            Passed - CBC within normal limits and completed in the last 12 months    WBC  Date Value Ref Range Status  04/23/2022 5.7 3.4 - 10.8 x10E3/uL Final   RBC  Date Value Ref Range Status  04/23/2022 3.56 (L) 3.77 - 5.28 x10E6/uL Final   Hemoglobin  Date Value Ref Range Status  04/23/2022 10.5 (L) 11.1 - 15.9 g/dL Final   Hematocrit  Date Value Ref Range Status  04/23/2022 32.2 (L) 34.0 - 46.6 % Final   MCHC  Date Value Ref Range Status  04/23/2022 32.6 31.5 - 35.7 g/dL Final   [facility]  Date Value Ref Range Status  04/23/2022 29.5 26.6 - 33.0 pg Final   MCV  Date Value Ref Range Status  04/23/2022 90 79 - 97 fL Final   No results found for: "PLTCOUNTKUC", "LABPLAT", "POCPLA" RDW  Date Value Ref Range Status  04/23/2022 12.8 11.7 - 15.4 % Final

## 2022-12-05 ENCOUNTER — Other Ambulatory Visit: Payer: Self-pay | Admitting: Family Medicine

## 2022-12-08 ENCOUNTER — Other Ambulatory Visit: Payer: Self-pay | Admitting: Family Medicine

## 2022-12-08 NOTE — Telephone Encounter (Signed)
Medication Refill - Medication:  nystatin (MYCOSTATIN/NYSTOP) powder   Has the patient contacted their pharmacy? Yes.   Christina from the pharmacy is calling in for the refill request.    Preferred Pharmacy (with phone number or street name): [facility] [place], [place] - [address] Phone: [phone] Fax: [phone]  Has the patient been seen for an appointment in the last year OR does the patient have an upcoming appointment? Yes.    Agent: Please be advised that RX refills may take up to 3 business days. We ask that you follow-up with your pharmacy.

## 2022-12-08 NOTE — Telephone Encounter (Signed)
Requested medications are due for refill today.  yes  Requested medications are on the active medications list.  yes  Last refill. 09/09/2022  Future visit scheduled.   yes  Notes to clinic.  Medication not assigned to a protocol. Please review for refill.    Requested Prescriptions  Pending Prescriptions Disp Refills   nystatin (NYAMYC) powder 15 g 10    Sig: APPLY TOPICALLY FOUR TIMES A DAY AS DIRECTED     Off-Protocol Failed - 12/05/2022  5:47 PM      Failed - Medication not assigned to a protocol, review manually.      Passed - Valid encounter within last 12 months    Recent Outpatient Visits           4 months ago Type 2 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 disease, without long-term current use of insulin (HCC)   [place] [facility] [place], Megan P, DO   7 months ago Routine general medical examination at a health care facility   [facility], Megan P, DO   10 months ago Encounter for [employer] annual wellness exam   [place] [facility] [place], [place] P, DO   1 year ago Urinary symptom or sign   [place] [facility] [place], Corrie Dandy T, NP   1 year ago Type 2 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 disease, without long-term current use of insulin (HCC)   [place] [facility] [place], [place], DO       Future Appointments             In 3 weeks Laural Benes, Oralia Rud, DO [place] [facility], [facility]            Signed Prescriptions Disp Refills   buPROPion (WELLBUTRIN XL) 150 MG 24 hr tablet 90 tablet 0    Sig: TAKE 1 TABLET BY MOUTH EVERY MORNING     Psychiatry: Antidepressants - bupropion Failed - 12/05/2022  5:47 PM      Failed - Cr in normal range and within 360 days    Creatinine, Ser  Date Value Ref Range Status  04/23/2022 1.20 (H) 0.57 - 1.00 mg/dL Final         Passed - AST in normal range and within 360 days    AST  Date Value  Ref Range Status  04/23/2022 15 0 - 40 IU/L Final   AST (SGOT) Piccolo, Waived  Date Value Ref Range Status  03/28/2015 21 11 - 38 U/L Final         Passed - ALT in normal range and within 360 days    ALT  Date Value Ref Range Status  04/23/2022 8 0 - 32 IU/L Final   ALT (SGPT) Piccolo, Waived  Date Value Ref Range Status  03/28/2015 22 10 - 47 U/L Final         Passed - Completed PHQ-2 or PHQ-9 in the last 360 days      Passed - Last BP in normal range    BP Readings from Last 1 Encounters:  07/22/22 138/70         Passed - Valid encounter within last 6 months    Recent Outpatient Visits           4 months ago Type 2 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 disease, without long-term current use of insulin (HCC)   [facility] [place], [place]  P, DO   7 months ago Routine general medical examination at a health care facility   [facility] [place], Megan P, DO   10 months ago Encounter for [employer] annual wellness exam   [place] [facility] [place], [place] P, DO   1 year ago Urinary symptom or sign   [place] [facility] [place], Corrie Dandy T, NP   1 year ago Type 2 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 disease, without long-term current use of insulin (HCC)   [place] [facility] [place], [place], DO       Future Appointments             In 3 weeks Laural Benes, Megan P, DO [place] [facility]             omeprazole (PRILOSEC) 20 MG capsule 180 capsule 1    Sig: TAKE TWO (2) CAPSULES BY MOUTH EVERY MORNING     Gastroenterology: Proton Pump Inhibitors Passed - 12/05/2022  5:47 PM      Passed - Valid encounter within last 12 months    Recent Outpatient Visits           4 months ago Type 2 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 disease, without long-term current use of insulin (HCC)   [facility] [place],  Megan P, DO   7 months ago Routine general medical examination at a health care facility   [facility], Megan P, DO   10 months ago Encounter for [employer] annual wellness exam   [place] [facility] [place], [place] P, DO   1 year ago Urinary symptom or sign   [place] [facility] [place], Corrie Dandy T, NP   1 year ago Type 2 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 disease, without long-term current use of insulin (HCC)   [facility] [place], Oralia Rud, DO       Future Appointments             In 3 weeks Laural Benes, Oralia Rud, DO  [facility]

## 2022-12-08 NOTE — Telephone Encounter (Signed)
Requested Prescriptions  Pending Prescriptions Disp Refills   buPROPion (WELLBUTRIN XL) 150 MG 24 hr tablet [Pharmacy Med Name: BUPROPION XL 150MG  TAB 150 Tablet] 90 tablet 0    Sig: TAKE 1 TABLET BY MOUTH EVERY MORNING     Psychiatry: Antidepressants - bupropion Failed - 12/05/2022  5:47 PM      Failed - Cr in normal range and within 360 days    Creatinine, Ser  Date Value Ref Range Status  04/23/2022 1.20 (H) 0.57 - 1.00 mg/dL Final         Passed - AST in normal range and within 360 days    AST  Date Value Ref Range Status  04/23/2022 15 0 - 40 IU/L Final   AST (SGOT) Piccolo, Waived  Date Value Ref Range Status  03/28/2015 21 11 - 38 U/L Final         Passed - ALT in normal range and within 360 days    ALT  Date Value Ref Range Status  04/23/2022 8 0 - 32 IU/L Final   ALT (SGPT) Piccolo, Waived  Date Value Ref Range Status  03/28/2015 22 10 - 47 U/L Final         Passed - Completed PHQ-2 or PHQ-9 in the last 360 days      Passed - Last BP in normal range    BP Readings from Last 1 Encounters:  07/22/22 138/70         Passed - Valid encounter within last 6 months    Recent Outpatient Visits           4 months ago Type 2 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 disease, without long-term current use of insulin (HCC)   [facility] [place], Megan P, DO   7 months ago Routine general medical examination at a health care facility   [facility], Megan P, DO   10 months ago Encounter for [employer] annual wellness exam   [place] [facility] [place], [place] P, DO   1 year ago Urinary symptom or sign   [place] [place], [place] T, NP   1 year ago Type 2 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 disease, without long-term current use of insulin (HCC)   [place] [facility] [place], [place], DO       Future Appointments             In 3 weeks  Laural Benes, Oralia Rud, DO [place] [facility]             nystatin [facility]) powder [Pharmacy Med Name: NYSTATIN PWD 15GM 100,000 100000/G Powder] 15 g 10    Sig: APPLY TOPICALLY FOUR TIMES A DAY AS DIRECTED     Off-Protocol Failed - 12/05/2022  5:47 PM      Failed - Medication not assigned to a protocol, review manually.      Passed - Valid encounter within last 12 months    Recent Outpatient Visits           4 months ago Type 2 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 disease, without long-term current use of insulin (HCC)   [place] [facility] [place], Megan P, DO   7 months ago Routine general medical examination at a health care facility   [facility], [facility] P, DO   10 months ago Encounter for [employer] annual  wellness exam   [place] [facility] [place], [place] P, DO   1 year ago Urinary symptom or sign   [place] [facility] [place], [place] T, NP   1 year ago Type 2 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 disease, without long-term current use of insulin (HCC)   [place] [facility] [place], [place], DO       Future Appointments             In 3 weeks Laural Benes, Oralia Rud, DO [place] [facility]             omeprazole (PRILOSEC) 20 MG capsule [Pharmacy Med Name: OMEPRAZOLE DR 20 MG CAPSULE 20 Capsule] 60 capsule 10    Sig: TAKE TWO (2) CAPSULES BY MOUTH EVERY MORNING     Gastroenterology: Proton Pump Inhibitors Passed - 12/05/2022  5:47 PM      Passed - Valid encounter within last 12 months    Recent Outpatient Visits           4 months ago Type 2 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 disease, without long-term current use of insulin (HCC)   [place] [facility] [place], Megan P, DO   7 months ago Routine general medical examination at a health care facility   [facility], [facility] P, DO   10 months ago Encounter for [employer] annual wellness exam   [place] [facility] [place], [place] P, DO   1 year ago Urinary symptom or sign   [facility] [place], Corrie Dandy T, NP   1 year ago Type 2 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 disease, without long-term current use of insulin (HCC)   [place] [facility] [place], Oralia Rud, DO       Future Appointments             In 3 weeks Laural Benes, Oralia Rud, DO [place] [facility], [facility]

## 2022-12-09 NOTE — Telephone Encounter (Signed)
Requested medication (s) are due for refill today:   No   Duplicate request.   Looks like it was refilled on 9/16  Requested medication (s) are on the active medication list:   Yes  Future visit scheduled:   Yes 10/7 with Dr. Laural Benes   Last ordered: 12/08/2022 15 g, 10 refills  Returned because no protocol assigned to this medication.   Duplicate request received.     Requested Prescriptions  Pending Prescriptions Disp Refills   nystatin (MYCOSTATIN/NYSTOP) powder 15 g 0    Sig: APPLY TOPICALLY FOUR TIMES DAILY AS DIRECTED     Off-Protocol Failed - 12/08/2022  1:22 PM      Failed - Medication not assigned to a protocol, review manually.      Passed - Valid encounter within last 12 months    Recent Outpatient Visits           4 months ago Type 2 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 disease, without long-term current use of insulin (HCC)   [facility] [place], Megan P, DO   7 months ago Routine general medical examination at a health care facility   [facility], Megan P, DO   10 months ago Encounter for [employer] annual wellness exam   [place] [facility] [place], [place] P, DO   1 year ago Urinary symptom or sign   [place] [facility] [place], Corrie Dandy T, NP   1 year ago Type 2 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 disease, without long-term current use of insulin (HCC)   [place] [facility] [place], Oralia Rud, DO       Future Appointments             In 2 weeks Laural Benes, Oralia Rud, DO  [facility], [facility]

## 2022-12-10 MED ORDER — NYSTATIN 100000 UNIT/GM EX POWD: CUTANEOUS | 0 refills | Status: DC

## 2022-12-17 ENCOUNTER — Other Ambulatory Visit: Payer: Self-pay | Admitting: Family Medicine

## 2022-12-18 NOTE — Telephone Encounter (Signed)
[facility] did not receive refills per [facility]. Requested Prescriptions  Pending Prescriptions Disp Refills   nystatin (NYAMYC) powder [Pharmacy Med Name: NYSTATIN PWD 15GM 100,000 100000/G Powder] 15 g 10    Sig: APPLY TOPICALLY FOUR TIMES A DAY AS DIRECTED     Off-Protocol Failed - 12/17/2022  7:46 PM      Failed - Medication not assigned to a protocol, review manually.      Passed - Valid encounter within last 12 months    Recent Outpatient Visits           4 months ago Type 2 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 disease, without long-term current use of insulin (HCC)   [place] [facility] [place], Megan P, DO   7 months ago Routine general medical examination at a health care facility   [facility], Megan P, DO   11 months ago Encounter for [employer] annual wellness exam   [place] [facility] [place], [place] P, DO   1 year ago Urinary symptom or sign   [place] [facility] [place], Corrie Dandy T, NP   1 year ago Type 2 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 disease, without long-term current use of insulin (HCC)   [place] [facility] [place], [place], DO       Future Appointments             In 1 week Laural Benes, Oralia Rud, DO [place] [facility], [facility]             Blood Glucose Monitoring Suppl (ONE TOUCH ULTRA 2) w/Device KIT [Pharmacy Med Name: ONETOUCH ULTRA 2 METER W/DEVICE Kit] 1 kit 10    Sig: TEST THREE TIMES A DAY     Endocrinology: Diabetes - Testing Supplies Passed - 12/17/2022  7:46 PM      Passed - Valid encounter within last 12 months    Recent Outpatient Visits           4 months ago Type 2 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 disease, without long-term current use of insulin (HCC)   [place] [facility] [place], Megan P, DO   7 months ago Routine general medical examination at a health care facility   [facility], Megan P, DO   11 months ago Encounter for [employer] annual wellness exam   [place] [place], [place] P, DO   1 year ago Urinary symptom or sign   [place] [facility] [place], Corrie Dandy T, NP   1 year ago Type 2 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 disease, without long-term current use of insulin (HCC)   [place] [facility] [place], Oralia Rud, DO       Future Appointments             In 1 week Laural Benes, Oralia Rud, DO [place] [facility], [facility]

## 2022-12-22 ENCOUNTER — Other Ambulatory Visit: Payer: Self-pay | Admitting: Family Medicine

## 2022-12-23 NOTE — Telephone Encounter (Signed)
Requested by interface surescripts . Future visit in 6 days.  Requested Prescriptions  Pending Prescriptions Disp Refills   rosuvastatin (CRESTOR) 10 MG tablet [Pharmacy Med Name: ROSUVASTATIN 10 MG TAB 10 Tablet] 30 tablet 0    Sig: TAKE 1 TABLET BY MOUTH ONCE DAILY     Cardiovascular:  Antilipid - Statins 2 Failed - 12/22/2022  4:52 PM      Failed - Cr in normal range and within 360 days    Creatinine, Ser  Date Value Ref Range Status  04/23/2022 1.20 (H) 0.57 - 1.00 mg/dL Final         Failed - Lipid Panel in normal range within the last 12 months    Cholesterol, Total  Date Value Ref Range Status  04/23/2022 146 100 - 199 mg/dL Final   Cholesterol Piccolo, Waived  Date Value Ref Range Status  10/25/2015 228 (H) <200 mg/dL Final    Comment:                            Desirable                <200                         Borderline High      200- 239                         High                     >239    LDL Chol Calc (NIH)  Date Value Ref Range Status  04/23/2022 68 0 - 99 mg/dL Final   HDL  Date Value Ref Range Status  04/23/2022 60 >39 mg/dL Final   Triglycerides  Date Value Ref Range Status  04/23/2022 98 0 - 149 mg/dL Final   Triglycerides Piccolo,Waived  Date Value Ref Range Status  10/25/2015 162 (H) <150 mg/dL Final    Comment:                            Normal                   <150                         Borderline High     150 - 199                         High                200 - 499                         Very High                >499          Passed - Patient is not pregnant      Passed - Valid encounter within last 12 months    Recent Outpatient Visits           5 months ago Type 2 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 disease, without long-term current use of insulin (HCC)   [place] [facility] [place], Megan P, DO   8  months ago Routine general medical examination at a health care facility   [facility] [place], Megan P, DO   11 months ago Encounter for [employer] annual wellness exam   [facility] [place], [place] P, DO   1 year ago Urinary symptom or sign   [place] [facility] [place], Corrie Dandy T, NP   1 year ago Type 2 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 disease, without long-term current use of insulin (HCC)   [place] [facility] [place], Oralia Rud, DO       Future Appointments             In 6 days Dorcas Carrow, DO [place] [facility], [facility]

## 2022-12-29 ENCOUNTER — Ambulatory Visit (INDEPENDENT_AMBULATORY_CARE_PROVIDER_SITE_OTHER): Payer: Medicare Other | Admitting: Family Medicine

## 2022-12-29 ENCOUNTER — Encounter: Payer: Self-pay | Admitting: Family Medicine

## 2022-12-29 VITALS — BP 130/67 | HR 74 | Ht 66.0 in | Wt 148.2 lb

## 2022-12-29 DIAGNOSIS — Z7985 Long-term (current) use of injectable non-insulin antidiabetic drugs: Secondary | ICD-10-CM

## 2022-12-29 DIAGNOSIS — Z23 Encounter for immunization: Secondary | ICD-10-CM

## 2022-12-29 DIAGNOSIS — N182 Chronic kidney disease, stage 2 (mild): Secondary | ICD-10-CM | POA: Diagnosis not present

## 2022-12-29 DIAGNOSIS — E782 Mixed hyperlipidemia: Secondary | ICD-10-CM

## 2022-12-29 DIAGNOSIS — E1122 Type 2 diabetes mellitus with diabetic chronic kidney disease: Secondary | ICD-10-CM | POA: Diagnosis not present

## 2022-12-29 DIAGNOSIS — D692 Other nonthrombocytopenic purpura: Secondary | ICD-10-CM

## 2022-12-29 DIAGNOSIS — F3341 Major depressive disorder, recurrent, in partial remission: Secondary | ICD-10-CM

## 2022-12-29 DIAGNOSIS — I129 Hypertensive chronic kidney disease with stage 1 through stage 4 chronic kidney disease, or unspecified chronic kidney disease: Secondary | ICD-10-CM | POA: Diagnosis not present

## 2022-12-29 MED ORDER — OMEPRAZOLE 20 MG PO CPDR: 20.0000 mg | DELAYED_RELEASE_CAPSULE | Freq: Two times a day (BID) | ORAL | 1 refills | Status: DC

## 2022-12-29 MED ORDER — LISINOPRIL-HYDROCHLOROTHIAZIDE 20-25 MG PO TABS: 1.0000 | ORAL_TABLET | Freq: Every morning | ORAL | 1 refills | Status: DC

## 2022-12-29 MED ORDER — MECLIZINE HCL 25 MG PO TABS: 25.0000 mg | ORAL_TABLET | Freq: Three times a day (TID) | ORAL | 6 refills | Status: DC | PRN

## 2022-12-29 MED ORDER — ROSUVASTATIN CALCIUM 10 MG PO TABS: 10.0000 mg | ORAL_TABLET | Freq: Every day | ORAL | 1 refills | Status: DC

## 2022-12-29 MED ORDER — BUPROPION HCL ER (XL) 150 MG PO TB24: 150.0000 mg | ORAL_TABLET | Freq: Every morning | ORAL | 1 refills | Status: DC

## 2022-12-29 MED ORDER — METFORMIN HCL 1000 MG PO TABS: 1000.0000 mg | ORAL_TABLET | Freq: Two times a day (BID) | ORAL | 1 refills | Status: DC

## 2022-12-29 NOTE — Assessment & Plan Note (Signed)
Reassured patient. Continue to monitor.  

## 2022-12-29 NOTE — Assessment & Plan Note (Signed)
Under good control on current regimen. Continue current regimen. Continue to monitor. Call with any concerns. Refills given. Labs drawn today.   

## 2022-12-29 NOTE — Progress Notes (Signed)
BP 130/67   Pulse 74   Ht 5\' 6"  (1.676 m)   Wt 148 lb 3.2 oz (67.2 kg)   SpO2 98%   BMI 23.92 kg/m    Subjective:    Patient ID: Alison Hernandez, female    DOB: [birth year], 77 y.o.   MRN: [id number]  HPI: Alison Hernandez is a 77 y.o. female  Chief Complaint  Patient presents with   Diabetes    Patient declines having a recent Diabetic Eye Exam.    DIABETES Hypoglycemic episodes:no Polydipsia/polyuria: no Visual disturbance: no Chest pain: no Paresthesias: no Glucose Monitoring: yes  Accucheck frequency: Daily  Fasting glucose: 120-130s  Post prandial: 265 Taking Insulin?: no Blood Pressure Monitoring: not checking Retinal Examination: Not up to Date Foot Exam: Up to Date Diabetic Education: Completed Pneumovax: Not up to Date Influenza:  Given today Aspirin: yes  HYPERTENSION / HYPERLIPIDEMIA Satisfied with current treatment? yes Duration of hypertension: chronic BP monitoring frequency: not checking BP medication side effects: no Past BP meds: lisinopril, HCTZ Duration of hyperlipidemia: chronic Cholesterol medication side effects: no Cholesterol supplements: none Past cholesterol medications: crestor Medication compliance: excellent compliance Aspirin: yes Recent stressors: no Recurrent headaches: no Visual changes: no Palpitations: no Dyspnea: no Chest pain: no Lower extremity edema: no Dizzy/lightheaded: no  ANXIETY/DEPRESSION Duration: chronic Status:controlled Anxious mood: no  Excessive worrying: no Irritability: no  Sweating: no Nausea: no Palpitations:no Hyperventilation: no Panic attacks: no Agoraphobia: no  Obscessions/compulsions: no Depressed mood: no    12/29/2022    9:51 AM 07/22/2022    9:38 AM 04/23/2022    9:51 AM 01/20/2022    9:58 AM 11/05/2021    4:09 PM  Depression screen PHQ 2/9  Decreased Interest 0 0 0 0 0  Down, Depressed, Hopeless 0 0 0 0 0  PHQ - 2 Score 0 0 0 0 0  Altered sleeping 0 0 0 0 0  Tired,  decreased energy 0 0 0 0 0  Change in appetite 0 0 0 0 0  Feeling bad or failure about yourself  0 0 0 0 0  Trouble concentrating 0 0 0 0 0  Moving slowly or fidgety/restless 0 0 0 0 0  Suicidal thoughts 0 0 0 0 0  PHQ-9 Score 0 0 0 0 0  Difficult doing work/chores Not difficult at all Not difficult at all Not difficult at all  Not difficult at all   Anhedonia: no Weight changes: no Insomnia: no   Hypersomnia: no Fatigue/loss of energy: no Feelings of worthlessness: no Feelings of guilt: no Impaired concentration/indecisiveness: no Suicidal ideations: no  Crying spells: no Recent Stressors/Life Changes: no   Relationship problems: no   Family stress: no     Financial stress: no    Job stress: no    Recent death/loss: no   Relevant past medical, surgical, family and social history reviewed and updated as indicated. Interim medical history since our last visit reviewed. Allergies and medications reviewed and updated.  Review of Systems  Constitutional: Negative.   Respiratory: Negative.    Cardiovascular: Negative.   Gastrointestinal: Negative.   Musculoskeletal: Negative.   Neurological: Negative.   Psychiatric/Behavioral: Negative.      Per HPI unless specifically indicated above     Objective:    BP 130/67   Pulse 74   Ht 5\' 6"  (1.676 m)   Wt 148 lb 3.2 oz (67.2 kg)   SpO2 98%   BMI 23.92 kg/m   Wt Readings from  Last 3 Encounters:  12/29/22 148 lb 3.2 oz (67.2 kg)  07/22/22 154 lb 9.6 oz (70.1 kg)  04/23/22 149 lb 14.4 oz (68 kg)    Physical Exam Vitals and nursing note reviewed.  Constitutional:      General: She is not in acute distress.    Appearance: Normal appearance. She is not ill-appearing, toxic-appearing or diaphoretic.  HENT:     Head: Normocephalic and atraumatic.     Right Ear: External ear normal.     Left Ear: External ear normal.     Nose: Nose normal.     Mouth/Throat:     Mouth: Mucous membranes are moist.     Pharynx: Oropharynx  is clear.  Eyes:     General: No scleral icterus.       Right eye: No discharge.        Left eye: No discharge.     Extraocular Movements: Extraocular movements intact.     Conjunctiva/sclera: Conjunctivae normal.     Pupils: Pupils are equal, round, and reactive to light.  Cardiovascular:     Rate and Rhythm: Normal rate and regular rhythm.     Pulses: Normal pulses.     Heart sounds: Normal heart sounds. No murmur heard.    No friction rub. No gallop.  Pulmonary:     Effort: Pulmonary effort is normal. No respiratory distress.     Breath sounds: Normal breath sounds. No stridor. No wheezing, rhonchi or rales.  Chest:     Chest wall: No tenderness.  Musculoskeletal:        General: Normal range of motion.     Cervical back: Normal range of motion and neck supple.  Skin:    General: Skin is warm and dry.     Capillary Refill: Capillary refill takes less than 2 seconds.     Coloration: Skin is not jaundiced or pale.     Findings: No bruising, erythema, lesion or rash.  Neurological:     General: No focal deficit present.     Mental Status: She is alert and oriented to person, place, and time. Mental status is at baseline.  Psychiatric:        Mood and Affect: Mood normal.        Behavior: Behavior normal.        Thought Content: Thought content normal.        Judgment: Judgment normal.     Results for orders placed or performed in visit on 07/22/22  Bayer DCA Hb A1c Waived  Result Value Ref Range   HB A1C (BAYER DCA - WAIVED) 7.2 (H) 4.8 - 5.6 %      Assessment & Plan:   Problem List Items Addressed This Visit       Cardiovascular and Mediastinum   Senile purpura (HCC)    Reassured patient. Continue to monitor.       Relevant Medications   lisinopril-hydrochlorothiazide (ZESTORETIC) 20-25 MG tablet   rosuvastatin (CRESTOR) 10 MG tablet   Other Relevant Orders   CBC with Differential/Platelet   Comprehensive metabolic panel     Endocrine   Diabetes (HCC)     Rechecking labs today. Await results. Treat as needed.       Relevant Medications   lisinopril-hydrochlorothiazide (ZESTORETIC) 20-25 MG tablet   metFORMIN (GLUCOPHAGE) 1000 MG tablet   rosuvastatin (CRESTOR) 10 MG tablet   Other Relevant Orders   CBC with Differential/Platelet   Comprehensive metabolic panel   Hgb A1c w/o eAG  Genitourinary   Benign hypertensive renal disease - Primary    Under good control on current regimen. Continue current regimen. Continue to monitor. Call with any concerns. Refills given. Labs drawn today.        Relevant Orders   CBC with Differential/Platelet   Comprehensive metabolic panel     Other   Hyperlipidemia    Under good control on current regimen. Continue current regimen. Continue to monitor. Call with any concerns. Refills given. Labs drawn today.       Relevant Medications   lisinopril-hydrochlorothiazide (ZESTORETIC) 20-25 MG tablet   rosuvastatin (CRESTOR) 10 MG tablet   Other Relevant Orders   CBC with Differential/Platelet   Comprehensive metabolic panel   Lipid Panel w/o Chol/HDL Ratio   Depression    Under good control on current regimen. Continue current regimen. Continue to monitor. Call with any concerns. Refills given.       Relevant Medications   buPROPion (WELLBUTRIN XL) 150 MG 24 hr tablet   Other Relevant Orders   CBC with Differential/Platelet   Comprehensive metabolic panel   Other Visit Diagnoses     Needs flu shot       Flu shot given today.   Relevant Orders   Flu Vaccine Trivalent High Dose (Fluad) (Completed)        Follow up plan: Return in about 3 months (around 03/31/2023).

## 2022-12-29 NOTE — Assessment & Plan Note (Signed)
Rechecking labs today. Await results. Treat as needed.  °

## 2022-12-29 NOTE — Assessment & Plan Note (Signed)
Under good control on current regimen. Continue current regimen. Continue to monitor. Call with any concerns. Refills given.   

## 2022-12-30 ENCOUNTER — Other Ambulatory Visit: Payer: Self-pay | Admitting: Family Medicine

## 2022-12-30 LAB — CBC WITH DIFFERENTIAL/PLATELET
Basophils Absolute: 0 10*3/uL (ref 0.0–0.2)
Basos: 0 %
EOS (ABSOLUTE): 0.1 10*3/uL (ref 0.0–0.4)
Eos: 2 %
Hematocrit: 31.9 % — ABNORMAL LOW (ref 34.0–46.6)
Hemoglobin: 10.4 g/dL — ABNORMAL LOW (ref 11.1–15.9)
Immature Grans (Abs): 0 10*3/uL (ref 0.0–0.1)
Immature Granulocytes: 0 %
Lymphocytes Absolute: 1.7 10*3/uL (ref 0.7–3.1)
Lymphs: 38 %
MCH: 30.4 pg (ref 26.6–33.0)
MCHC: 32.6 g/dL (ref 31.5–35.7)
MCV: 93 fL (ref 79–97)
Monocytes Absolute: 0.3 10*3/uL (ref 0.1–0.9)
Monocytes: 6 %
Neutrophils Absolute: 2.5 10*3/uL (ref 1.4–7.0)
Neutrophils: 54 %
Platelets: 205 10*3/uL (ref 150–450)
RBC: 3.42 x10E6/uL — ABNORMAL LOW (ref 3.77–5.28)
RDW: 13.1 % (ref 11.7–15.4)
WBC: 4.6 10*3/uL (ref 3.4–10.8)

## 2022-12-30 LAB — COMPREHENSIVE METABOLIC PANEL
ALT: 10 [IU]/L (ref 0–32)
AST: 12 [IU]/L (ref 0–40)
Albumin: 4.2 g/dL (ref 3.8–4.8)
Alkaline Phosphatase: 64 [IU]/L (ref 44–121)
BUN/Creatinine Ratio: 14 (ref 12–28)
BUN: 17 mg/dL (ref 8–27)
Bilirubin Total: 0.5 mg/dL (ref 0.0–1.2)
CO2: 23 mmol/L (ref 20–29)
Calcium: 9.2 mg/dL (ref 8.7–10.3)
Chloride: 101 mmol/L (ref 96–106)
Creatinine, Ser: 1.21 mg/dL — ABNORMAL HIGH (ref 0.57–1.00)
Globulin, Total: 2.1 g/dL (ref 1.5–4.5)
Glucose: 122 mg/dL — ABNORMAL HIGH (ref 70–99)
Potassium: 4.2 mmol/L (ref 3.5–5.2)
Sodium: 138 mmol/L (ref 134–144)
Total Protein: 6.3 g/dL (ref 6.0–8.5)
eGFR: 47 mL/min/{1.73_m2} — ABNORMAL LOW (ref >59)

## 2022-12-30 LAB — HGB A1C W/O EAG: Hgb A1c MFr Bld: 6.9 % — ABNORMAL HIGH (ref 4.8–5.6)

## 2022-12-30 LAB — LIPID PANEL W/O CHOL/HDL RATIO
Cholesterol, Total: 149 mg/dL (ref 100–199)
HDL: 60 mg/dL (ref >39)
LDL Chol Calc (NIH): 68 mg/dL (ref 0–99)
Triglycerides: 117 mg/dL (ref 0–149)
VLDL Cholesterol Cal: 21 mg/dL (ref 5–40)

## 2022-12-31 NOTE — Telephone Encounter (Signed)
Requested Prescriptions  Pending Prescriptions Disp Refills   Lancets (ONETOUCH DELICA PLUS LANCET33G) MISC [Pharmacy Med Name: ONETOUCH DELICA+33G LNC 100 Miscellaneous] 100 each 10    Sig: USE AS DIRECTED THREE TIMES A DAY     Endocrinology: Diabetes - Testing Supplies Passed - 12/30/2022  3:32 PM      Passed - Valid encounter within last 12 months    Recent Outpatient Visits           2 days ago Benign hypertensive renal disease   [facility] [place], Megan P, DO   5 months ago Type 2 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 disease, without long-term current use of insulin (HCC)   [place] [facility] [place], Megan P, DO   8 months ago Routine general medical examination at a health care facility   [facility] [place], Megan P, DO   11 months ago Encounter for [employer] annual wellness exam   [place] [facility] [place], [place] P, DO   1 year ago Urinary symptom or sign   [place] [facility] Marjie Skiff, NP       Future Appointments             In 3 months Laural Benes, Oralia Rud, DO [place] [facility], [facility]

## 2023-01-15 ENCOUNTER — Other Ambulatory Visit: Payer: Self-pay | Admitting: Family Medicine

## 2023-01-16 NOTE — Telephone Encounter (Signed)
Requested medication (s) are due for refill today: yes  Requested medication (s) are on the active medication list: yes  Last refill:  12/18/22  Future visit scheduled: yes  Notes to clinic:  Medication not assigned to a protocol, review manually.      Requested Prescriptions  Pending Prescriptions Disp Refills   NYAMYC powder [Pharmacy Med Name: NYSTATIN PWD 15GM 100,000 100000/G Powder] 15 g 10    Sig: APPLY TOPICALLY FOUR TIMES A DAY AS DIRECTED     Off-Protocol Failed - 01/15/2023  7:05 PM      Failed - Medication not assigned to a protocol, review manually.      Passed - Valid encounter within last 12 months    Recent Outpatient Visits           2 weeks ago Benign hypertensive renal disease   [place] [facility] [place], Megan P, DO   5 months ago Type 2 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 disease, without long-term current use of insulin (HCC)   [facility] [place], Megan P, DO   8 months ago Routine general medical examination at a health care facility   [facility], Megan P, DO   12 months ago Encounter for [employer] annual wellness exam   [place] [facility] [place], [place] P, DO   1 year ago Urinary symptom or sign   [place] [facility] [place], Corrie Dandy T, NP       Future Appointments             In 2 months Johnson, Megan P, DO [place] [facility], [facility]             diclofenac Sodium (VOLTAREN) 1 % GEL [Pharmacy Med Name: DICLOFENAC 1% GEL 100GM Gel] 300 g 10    Sig: APPLY 4 GRAMS TOPICALLY FOUR TIMES A DAY AS DIRECTED     Analgesics:  Topicals Failed - 01/15/2023  7:05 PM      Failed - Manual Review: Labs are only required if the patient has taken medication for more than 8 weeks.      Failed - HGB in normal range and within 360 days    Hemoglobin  Date Value Ref Range Status  12/29/2022 10.4 (L) 11.1 - 15.9 g/dL Final          Failed - HCT in normal range and within 360 days    Hematocrit  Date Value Ref Range Status  12/29/2022 31.9 (L) 34.0 - 46.6 % Final         Failed - Cr in normal range and within 360 days    Creatinine, Ser  Date Value Ref Range Status  12/29/2022 1.21 (H) 0.57 - 1.00 mg/dL Final         Passed - PLT in normal range and within 360 days    Platelets  Date Value Ref Range Status  12/29/2022 205 150 - 450 x10E3/uL Final         Passed - eGFR is 30 or above and within 360 days    GFR calc Af Amer  Date Value Ref Range Status  04/04/2020 48 (L) >59 mL/min/1.73 Final    Comment:    **In accordance with recommendations from the NKF-ASN Task force,**   [facility] is in the process of updating its eGFR calculation to the   2021 CKD-EPI creatinine equation that estimates kidney function   without a race variable.  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

## 2023-01-18 ENCOUNTER — Other Ambulatory Visit: Payer: Self-pay | Admitting: Family Medicine

## 2023-01-20 NOTE — Telephone Encounter (Signed)
Request is too soon, last refilled 12/29/22 for 90 and 1 refill.  Requested Prescriptions  Pending Prescriptions Disp Refills   rosuvastatin (CRESTOR) 10 MG tablet [Pharmacy Med Name: ROSUVASTATIN 10 MG TAB 10 Tablet] 30 tablet 10    Sig: TAKE 1 TABLET BY MOUTH ONCE DAILY     Cardiovascular:  Antilipid - Statins 2 Failed - 01/18/2023  8:40 PM      Failed - Cr in normal range and within 360 days    Creatinine, Ser  Date Value Ref Range Status  12/29/2022 1.21 (H) 0.57 - 1.00 mg/dL Final         Failed - Lipid Panel in normal range within the last 12 months    Cholesterol, Total  Date Value Ref Range Status  12/29/2022 149 100 - 199 mg/dL Final   Cholesterol Piccolo, Waived  Date Value Ref Range Status  10/25/2015 228 (H) <200 mg/dL Final    Comment:                            Desirable                <200                         Borderline High      200- 239                         High                     >239    LDL Chol Calc (NIH)  Date Value Ref Range Status  12/29/2022 68 0 - 99 mg/dL Final   HDL  Date Value Ref Range Status  12/29/2022 60 >39 mg/dL Final   Triglycerides  Date Value Ref Range Status  12/29/2022 117 0 - 149 mg/dL Final   Triglycerides Piccolo,Waived  Date Value Ref Range Status  10/25/2015 162 (H) <150 mg/dL Final    Comment:                            Normal                   <150                         Borderline High     150 - 199                         High                200 - 499                         Very High                >499          Passed - Patient is not pregnant      Passed - Valid encounter within last 12 months    Recent Outpatient Visits           3 weeks ago Benign hypertensive renal disease   [place] [place], Megan P, DO   6 months ago Type 2 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  disease, without long-term current use of insulin (HCC)   [place] [facility]  [place], Megan P, DO   9 months ago Routine general medical examination at a health care facility   [facility], [place] P, DO   1 year ago Encounter for [employer] annual wellness exam   [place] [facility] [place], [place] P, DO   1 year ago Urinary symptom or sign   [place] [facility] Marjie Skiff, NP       Future Appointments             In 2 months Laural Benes, Oralia Rud, DO [place] [facility], [facility]

## 2023-01-21 ENCOUNTER — Ambulatory Visit: Payer: Medicare Other

## 2023-01-26 ENCOUNTER — Other Ambulatory Visit: Payer: Self-pay | Admitting: Family Medicine

## 2023-01-27 NOTE — Telephone Encounter (Signed)
Requested by interface surescripts. Future visit in 2 months.  Requested Prescriptions  Pending Prescriptions Disp Refills   TRULICITY 4.5 MG/0.5ML SOAJ [Pharmacy Med Name: Trulicity Subcutaneous Solution Auto-injector 4.5 MG/0.5ML] 6 mL 0    Sig: INJECT 4.5MG  (0.5ML) UNDER THE SKIN ONCE A WEEK     Endocrinology:  Diabetes - GLP-1 Receptor Agonists Passed - 01/26/2023  8:01 AM      Passed - HBA1C is between 0 and 7.9 and within 180 days    Hemoglobin A1C  Date Value Ref Range Status  11/28/2015 7.5  Final   HB A1C (BAYER DCA - WAIVED)  Date Value Ref Range Status  07/22/2022 7.2 (H) 4.8 - 5.6 % Final    Comment:             Prediabetes: 5.7 - 6.4          Diabetes: >6.4          Glycemic control for adults with diabetes: <7.0    Hgb A1c MFr Bld  Date Value Ref Range Status  12/29/2022 6.9 (H) 4.8 - 5.6 % Final    Comment:             Prediabetes: 5.7 - 6.4          Diabetes: >6.4          Glycemic control for adults with diabetes: <7.0          Passed - Valid encounter within last 6 months    Recent Outpatient Visits           4 weeks ago Benign hypertensive renal disease   [place] [facility] [place], Megan P, DO   6 months ago Type 2 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 disease, without long-term current use of insulin (HCC)   [place] [facility] [place], Megan P, DO   9 months ago Routine general medical examination at a health care facility   [facility] [place], [place] P, DO   1 year ago Encounter for Medicare annual wellness exam   [place] [facility] [place], [place] P, DO   1 year ago Urinary symptom or sign   [place] [facility] Marjie Skiff, NP       Future Appointments             In 2 months Laural Benes, Oralia Rud, DO [place] [facility], [facility]

## 2023-02-04 ENCOUNTER — Ambulatory Visit (INDEPENDENT_AMBULATORY_CARE_PROVIDER_SITE_OTHER): Payer: Medicare Other

## 2023-02-04 DIAGNOSIS — Z23 Encounter for immunization: Secondary | ICD-10-CM

## 2023-02-06 ENCOUNTER — Other Ambulatory Visit: Payer: Self-pay | Admitting: Family Medicine

## 2023-02-09 ENCOUNTER — Other Ambulatory Visit: Payer: Self-pay

## 2023-02-09 MED ORDER — ROSUVASTATIN CALCIUM 10 MG PO TABS: 10.0000 mg | ORAL_TABLET | Freq: Every day | ORAL | 1 refills | Status: DC

## 2023-02-09 NOTE — Telephone Encounter (Signed)
Requesting RX be sent to mail order pharmacy

## 2023-02-09 NOTE — Telephone Encounter (Signed)
Requested Prescriptions  Refused Prescriptions Disp Refills   rosuvastatin (CRESTOR) 10 MG tablet [Pharmacy Med Name: ROSUVASTATIN 10 MG TAB 10 Tablet] 30 tablet 10    Sig: TAKE 1 TABLET BY MOUTH ONCE DAILY     Cardiovascular:  Antilipid - Statins 2 Failed - 02/06/2023  7:48 PM      Failed - Cr in normal range and within 360 days    Creatinine, Ser  Date Value Ref Range Status  12/29/2022 1.21 (H) 0.57 - 1.00 mg/dL Final         Failed - Lipid Panel in normal range within the last 12 months    Cholesterol, Total  Date Value Ref Range Status  12/29/2022 149 100 - 199 mg/dL Final   Cholesterol Piccolo, Waived  Date Value Ref Range Status  10/25/2015 228 (H) <200 mg/dL Final    Comment:                            Desirable                <200                         Borderline High      200- 239                         High                     >239    LDL Chol Calc (NIH)  Date Value Ref Range Status  12/29/2022 68 0 - 99 mg/dL Final   HDL  Date Value Ref Range Status  12/29/2022 60 >39 mg/dL Final   Triglycerides  Date Value Ref Range Status  12/29/2022 117 0 - 149 mg/dL Final   Triglycerides Piccolo,Waived  Date Value Ref Range Status  10/25/2015 162 (H) <150 mg/dL Final    Comment:                            Normal                   <150                         Borderline High     150 - 199                         High                200 - 499                         Very High                >499          Passed - Patient is not pregnant      Passed - Valid encounter within last 12 months    Recent Outpatient Visits           1 month ago Benign hypertensive renal disease   [facility] [place], Megan P, DO   6 months ago Type 2 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 disease, without long-term current use of insulin (HCC)   [place] [facility] [place], Megan P, DO   9 months ago Routine general medical  examination at a health care facility   [facility] [place], [place] P, DO   1 year ago Encounter for [employer] annual wellness exam   [place] [facility] [place], [place] P, DO   1 year ago Urinary symptom or sign   [place] [facility] Marjie Skiff, NP       Future Appointments             In 1 month Johnson, Oralia Rud, DO [facility], [facility]

## 2023-02-13 ENCOUNTER — Other Ambulatory Visit: Payer: Self-pay | Admitting: Family Medicine

## 2023-02-13 ENCOUNTER — Telehealth: Payer: Self-pay | Admitting: Family Medicine

## 2023-02-13 NOTE — Telephone Encounter (Signed)
Copied from CRM [id number]. Topic: Medicare AWV >> Feb 13, 2023  2:57 PM Payton Doughty wrote: Reason for CRM: Called LVM 02/13/2023 to schedule Annual Wellness Visit  Verlee Rossetti; Care Guide Ambulatory Clinical Support [facility] Direct Dial: [phone]

## 2023-02-16 NOTE — Telephone Encounter (Signed)
Requested medication (s) are due for refill today: yes  Requested medication (s) are on the active medication list: yes  Last refill:  07/22/22 #60 4 RF  Future visit scheduled: yes  Notes to clinic:  med not delegated to NT to RF   Requested Prescriptions  Pending Prescriptions Disp Refills   ondansetron (ZOFRAN-ODT) 8 MG disintegrating tablet [Pharmacy Med Name: ONDANSETRON 8MG  ODT 8 ODT] 60 tablet     Sig: PLACE 1 TABLET ON TONGUE AND ALLOW TO DISSOLVE EVERY 8 HOURS AS NEEDED FOR NAUSEA & VOMITING     Not Delegated - Gastroenterology: Antiemetics - ondansetron Failed - 02/13/2023  7:06 PM      Failed - This refill cannot be delegated      Passed - AST in normal range and within 360 days    AST  Date Value Ref Range Status  12/29/2022 12 0 - 40 IU/L Final   AST (SGOT) Piccolo, Waived  Date Value Ref Range Status  03/28/2015 21 11 - 38 U/L Final         Passed - ALT in normal range and within 360 days    ALT  Date Value Ref Range Status  12/29/2022 10 0 - 32 IU/L Final   ALT (SGPT) Piccolo, Waived  Date Value Ref Range Status  03/28/2015 22 10 - 47 U/L Final         Passed - Valid encounter within last 6 months    Recent Outpatient Visits           1 month ago Benign hypertensive renal disease   [place] [facility] [place], Megan P, DO   6 months ago Type 2 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 disease, without long-term current use of insulin (HCC)   [place] [facility] [place], Megan P, DO   9 months ago Routine general medical examination at a health care facility   [facility] [place], Megan P, DO   1 year ago Encounter for Medicare annual wellness exam   [place] [facility] [place], [place] P, DO   1 year ago Urinary symptom or sign   [place] [facility] [place], Corrie Dandy T, NP       Future Appointments             In 1 month Johnson, Megan P, DO [facility], [facility]            Signed Prescriptions Disp Refills   buPROPion (WELLBUTRIN XL) 150 MG 24 hr tablet 30 tablet 3    Sig: TAKE 1 TABLET BY MOUTH EVERY MORNING     Psychiatry: Antidepressants - bupropion Failed - 02/13/2023  7:06 PM      Failed - Cr in normal range and within 360 days    Creatinine, Ser  Date Value Ref Range Status  12/29/2022 1.21 (H) 0.57 - 1.00 mg/dL Final         Passed - AST in normal range and within 360 days    AST  Date Value Ref Range Status  12/29/2022 12 0 - 40 IU/L Final   AST (SGOT) Piccolo, Waived  Date Value Ref Range Status  03/28/2015 21 11 - 38 U/L Final         Passed - ALT in normal range and within 360 days    ALT  Date Value Ref Range Status  12/29/2022 10 0 - 32 IU/L Final   ALT (  SGPT) Piccolo, Waived  Date Value Ref Range Status  03/28/2015 22 10 - 47 U/L Final         Passed - Completed PHQ-2 or PHQ-9 in the last 360 days      Passed - Last BP in normal range    BP Readings from Last 1 Encounters:  12/29/22 130/67         Passed - Valid encounter within last 6 months    Recent Outpatient Visits           1 month ago Benign hypertensive renal disease   [place] [facility] [place], Megan P, DO   6 months ago Type 2 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 disease, without long-term current use of insulin (HCC)   [place] [facility] [place], Megan P, DO   9 months ago Routine general medical examination at a health care facility   [facility] [place], Megan P, DO   1 year ago Encounter for Medicare annual wellness exam   [place] [facility] [place], [place] P, DO   1 year ago Urinary symptom or sign   [facility] [place], Corrie Dandy T, NP       Future Appointments             In 1 month Johnson, Megan P, DO [place] [facility], [facility]             metFORMIN  (GLUCOPHAGE) 1000 MG tablet 60 tablet 3    Sig: TAKE ONE (1) TABLET BY MOUTH TWICE DAILY     Endocrinology:  Diabetes - Biguanides Failed - 02/13/2023  7:06 PM      Failed - Cr in normal range and within 360 days    Creatinine, Ser  Date Value Ref Range Status  12/29/2022 1.21 (H) 0.57 - 1.00 mg/dL Final         Failed - eGFR in normal range and within 360 days    GFR calc Af Amer  Date Value Ref Range Status  04/04/2020 48 (L) >59 mL/min/1.73 Final    Comment:    **In accordance with recommendations from the NKF-ASN Task force,**   [facility] is in the process of updating its eGFR calculation to the   2021 CKD-EPI creatinine equation that estimates kidney function   without a race variable.    GFR calc non Af Amer  Date Value Ref Range Status  04/04/2020 42 (L) >59 mL/min/1.73 Final   eGFR  Date Value Ref Range Status  12/29/2022 47 (L) >59 mL/min/1.73 Final         Failed - B12 Level in normal range and within 720 days    No results found for: "VITAMINB12"       Passed - HBA1C is between 0 and 7.9 and within 180 days    Hemoglobin A1C  Date Value Ref Range Status  11/28/2015 7.5  Final   HB A1C (BAYER DCA - WAIVED)  Date Value Ref Range Status  07/22/2022 7.2 (H) 4.8 - 5.6 % Final    Comment:             Prediabetes: 5.7 - 6.4          Diabetes: >6.4          Glycemic control for adults with diabetes: <7.0    Hgb A1c MFr Bld  Date Value Ref Range Status  12/29/2022 6.9 (H) 4.8 - 5.6 % Final  Comment:             Prediabetes: 5.7 - 6.4          Diabetes: >6.4          Glycemic control for adults with diabetes: <7.0          Passed - Valid encounter within last 6 months    Recent Outpatient Visits           1 month ago Benign hypertensive renal disease   [facility] [place], Megan P, DO   6 months ago Type 2 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 disease, without long-term current use of insulin (HCC)   [place] [facility] [place], Megan P, DO   9 months ago Routine general medical examination at a health care facility   [facility] [place], Megan P, DO   1 year ago Encounter for Medicare annual wellness exam   [facility] [place], [place] P, DO   1 year ago Urinary symptom or sign   [place] [facility] [place], Corrie Dandy T, NP       Future Appointments             In 1 month Johnson, Oralia Rud, DO  [facility], [facility]            Passed - CBC within normal limits and completed in the last 12 months    WBC  Date Value Ref Range Status  12/29/2022 4.6 3.4 - 10.8 x10E3/uL Final   RBC  Date Value Ref Range Status  12/29/2022 3.42 (L) 3.77 - 5.28 x10E6/uL Final   Hemoglobin  Date Value Ref Range Status  12/29/2022 10.4 (L) 11.1 - 15.9 g/dL Final   Hematocrit  Date Value Ref Range Status  12/29/2022 31.9 (L) 34.0 - 46.6 % Final   MCHC  Date Value Ref Range Status  12/29/2022 32.6 31.5 - 35.7 g/dL Final   [facility]  Date Value Ref Range Status  12/29/2022 30.4 26.6 - 33.0 pg Final   MCV  Date Value Ref Range Status  12/29/2022 93 79 - 97 fL Final   No results found for: "PLTCOUNTKUC", "LABPLAT", "POCPLA" RDW  Date Value Ref Range Status  12/29/2022 13.1 11.7 - 15.4 % Final          lisinopril-hydrochlorothiazide (ZESTORETIC) 20-25 MG tablet 30 tablet 3    Sig: TAKE 1 TABLET BY MOUTH EVERY MORNING     Cardiovascular:  ACEI + Diuretic Combos Failed - 02/13/2023  7:06 PM      Failed - Cr in normal range and within 180 days    Creatinine, Ser  Date Value Ref Range Status  12/29/2022 1.21 (H) 0.57 - 1.00 mg/dL Final         Passed - Na in normal range and within 180 days    Sodium  Date Value Ref Range Status  12/29/2022 138 134 - 144 mmol/L Final         Passed - K in normal range and within 180 days    Potassium  Date Value Ref Range Status  12/29/2022 4.2 3.5 - 5.2  mmol/L Final         Passed - eGFR is 30 or above and within 180 days    GFR calc Af Amer  Date Value Ref Range Status  04/04/2020 48 (L) >59 mL/min/1.73 Final    Comment:    **In accordance  with recommendations from the NKF-ASN Task force,**   [facility] is in the process of updating its eGFR calculation to the   2021 CKD-EPI creatinine equation that estimates kidney function   without a race variable.    GFR calc non Af Amer  Date Value Ref Range Status  04/04/2020 42 (L) >59 mL/min/1.73 Final   eGFR  Date Value Ref Range Status  12/29/2022 47 (L) >59 mL/min/1.73 Final         Passed - Patient is not pregnant      Passed - Last BP in normal range    BP Readings from Last 1 Encounters:  12/29/22 130/67         Passed - Valid encounter within last 6 months    Recent Outpatient Visits           1 month ago Benign hypertensive renal disease   [place] [facility] [place], Megan P, DO   6 months ago Type 2 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 disease, without long-term current use of insulin (HCC)   [facility] [place], Megan P, DO   9 months ago Routine general medical examination at a health care facility   [facility] [place], [place] P, DO   1 year ago Encounter for Medicare annual wellness exam   [facility] [place], [place] P, DO   1 year ago Urinary symptom or sign   [place] [facility] Marjie Skiff, NP       Future Appointments             In 1 month Johnson, Oralia Rud, DO [place] [facility], [facility]

## 2023-02-16 NOTE — Telephone Encounter (Signed)
Requested Prescriptions  Pending Prescriptions Disp Refills   buPROPion (WELLBUTRIN XL) 150 MG 24 hr tablet [Pharmacy Med Name: BUPROPION XL 150MG  TAB 150 Tablet] 30 tablet 3    Sig: TAKE 1 TABLET BY MOUTH EVERY MORNING     Psychiatry: Antidepressants - bupropion Failed - 02/13/2023  7:06 PM      Failed - Cr in normal range and within 360 days    Creatinine, Ser  Date Value Ref Range Status  12/29/2022 1.21 (H) 0.57 - 1.00 mg/dL Final         Passed - AST in normal range and within 360 days    AST  Date Value Ref Range Status  12/29/2022 12 0 - 40 IU/L Final   AST (SGOT) Piccolo, Waived  Date Value Ref Range Status  03/28/2015 21 11 - 38 U/L Final         Passed - ALT in normal range and within 360 days    ALT  Date Value Ref Range Status  12/29/2022 10 0 - 32 IU/L Final   ALT (SGPT) Piccolo, Waived  Date Value Ref Range Status  03/28/2015 22 10 - 47 U/L Final         Passed - Completed PHQ-2 or PHQ-9 in the last 360 days      Passed - Last BP in normal range    BP Readings from Last 1 Encounters:  12/29/22 130/67         Passed - Valid encounter within last 6 months    Recent Outpatient Visits           1 month ago Benign hypertensive renal disease   [facility] [place], Megan P, DO   6 months ago Type 2 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 disease, without long-term current use of insulin (HCC)   [facility] [facility] [place], Megan P, DO   9 months ago Routine general medical examination at a health care facility   [facility] [place], Megan P, DO   1 year ago Encounter for Medicare annual wellness exam   [place] [facility] [place], [place] P, DO   1 year ago Urinary symptom or sign   [place] [facility] [place], Corrie Dandy T, NP       Future Appointments             In 1 month Johnson, Megan P, DO [place] [facility], [facility]              metFORMIN (GLUCOPHAGE) 1000 MG tablet [Pharmacy Med Name: METFORMIN 1000MG  TABLET Tablet] 60 tablet 3    Sig: TAKE ONE (1) TABLET BY MOUTH TWICE DAILY     Endocrinology:  Diabetes - Biguanides Failed - 02/13/2023  7:06 PM      Failed - Cr in normal range and within 360 days    Creatinine, Ser  Date Value Ref Range Status  12/29/2022 1.21 (H) 0.57 - 1.00 mg/dL Final         Failed - eGFR in normal range and within 360 days    GFR calc Af Amer  Date Value Ref Range Status  04/04/2020 48 (L) >59 mL/min/1.73 Final    Comment:    **In accordance with recommendations from the NKF-ASN Task force,**   [facility] is in the process of updating its eGFR calculation to the   2021 CKD-EPI creatinine equation that estimates kidney function   without a race  variable.    GFR calc non Af Amer  Date Value Ref Range Status  04/04/2020 42 (L) >59 mL/min/1.73 Final   eGFR  Date Value Ref Range Status  12/29/2022 47 (L) >59 mL/min/1.73 Final         Failed - B12 Level in normal range and within 720 days    No results found for: "VITAMINB12"       Passed - HBA1C is between 0 and 7.9 and within 180 days    Hemoglobin A1C  Date Value Ref Range Status  11/28/2015 7.5  Final   HB A1C (BAYER DCA - WAIVED)  Date Value Ref Range Status  07/22/2022 7.2 (H) 4.8 - 5.6 % Final    Comment:             Prediabetes: 5.7 - 6.4          Diabetes: >6.4          Glycemic control for adults with diabetes: <7.0    Hgb A1c MFr Bld  Date Value Ref Range Status  12/29/2022 6.9 (H) 4.8 - 5.6 % Final    Comment:             Prediabetes: 5.7 - 6.4          Diabetes: >6.4          Glycemic control for adults with diabetes: <7.0          Passed - Valid encounter within last 6 months    Recent Outpatient Visits           1 month ago Benign hypertensive renal disease   [place] [facility] [place], Megan P, DO   6 months ago Type 2 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 disease,  without long-term current use of insulin (HCC)   [place] [facility] [place], Megan P, DO   9 months ago Routine general medical examination at a health care facility   [facility] [place], Megan P, DO   1 year ago Encounter for Medicare annual wellness exam   [place] [facility] [place], [place] P, DO   1 year ago Urinary symptom or sign   [place] [facility] [place], Corrie Dandy T, NP       Future Appointments             In 1 month Johnson, Megan P, DO [place] [facility], [facility]            Passed - CBC within normal limits and completed in the last 12 months    WBC  Date Value Ref Range Status  12/29/2022 4.6 3.4 - 10.8 x10E3/uL Final   RBC  Date Value Ref Range Status  12/29/2022 3.42 (L) 3.77 - 5.28 x10E6/uL Final   Hemoglobin  Date Value Ref Range Status  12/29/2022 10.4 (L) 11.1 - 15.9 g/dL Final   Hematocrit  Date Value Ref Range Status  12/29/2022 31.9 (L) 34.0 - 46.6 % Final   MCHC  Date Value Ref Range Status  12/29/2022 32.6 31.5 - 35.7 g/dL Final   [facility]  Date Value Ref Range Status  12/29/2022 30.4 26.6 - 33.0 pg Final   MCV  Date Value Ref Range Status  12/29/2022 93 79 - 97 fL Final   No results found for: "PLTCOUNTKUC", "LABPLAT", "POCPLA" RDW  Date Value Ref Range Status  12/29/2022 13.1 11.7 - 15.4 % Final  lisinopril-hydrochlorothiazide (ZESTORETIC) 20-25 MG tablet [Pharmacy Med Name: LISINOPRIL-HCTZ 20-25 MG TA 20-25 Tablet] 30 tablet 3    Sig: TAKE 1 TABLET BY MOUTH EVERY MORNING     Cardiovascular:  ACEI + Diuretic Combos Failed - 02/13/2023  7:06 PM      Failed - Cr in normal range and within 180 days    Creatinine, Ser  Date Value Ref Range Status  12/29/2022 1.21 (H) 0.57 - 1.00 mg/dL Final         Passed - Na in normal range and within 180 days    Sodium  Date Value Ref Range Status  12/29/2022 138 134 - 144 mmol/L Final          Passed - K in normal range and within 180 days    Potassium  Date Value Ref Range Status  12/29/2022 4.2 3.5 - 5.2 mmol/L Final         Passed - eGFR is 30 or above and within 180 days    GFR calc Af Amer  Date Value Ref Range Status  04/04/2020 48 (L) >59 mL/min/1.73 Final    Comment:    **In accordance with recommendations from the NKF-ASN Task force,**   [facility] is in the process of updating its eGFR calculation to the   2021 CKD-EPI creatinine equation that estimates kidney function   without a race variable.    GFR calc non Af Amer  Date Value Ref Range Status  04/04/2020 42 (L) >59 mL/min/1.73 Final   eGFR  Date Value Ref Range Status  12/29/2022 47 (L) >59 mL/min/1.73 Final         Passed - Patient is not pregnant      Passed - Last BP in normal range    BP Readings from Last 1 Encounters:  12/29/22 130/67         Passed - Valid encounter within last 6 months    Recent Outpatient Visits           1 month ago Benign hypertensive renal disease   [facility] [place], Megan P, DO   6 months ago Type 2 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 disease, without long-term current use of insulin (HCC)   [facility] [place], Megan P, DO   9 months ago Routine general medical examination at a health care facility   [facility] [place] [place], [place] P, DO   1 year ago Encounter for Medicare annual wellness exam   [place] [facility] [place], [place] P, DO   1 year ago Urinary symptom or sign   [place] [facility] [place], Dorie Rank, NP       Future Appointments             In 1 month Johnson, Megan P, DO [place] [facility], [facility]             ondansetron (ZOFRAN-ODT) 8 MG disintegrating tablet [Pharmacy Med Name: ONDANSETRON 8MG  ODT 8 ODT] 60 tablet     Sig: PLACE 1 TABLET ON TONGUE AND ALLOW TO DISSOLVE EVERY 8 HOURS AS NEEDED FOR NAUSEA &  VOMITING     Not Delegated - Gastroenterology: Antiemetics - ondansetron Failed - 02/13/2023  7:06 PM      Failed - This refill cannot be delegated      Passed - AST in normal range and within 360 days    AST  Date Value Ref Range  Status  12/29/2022 12 0 - 40 IU/L Final   AST (SGOT) Piccolo, Waived  Date Value Ref Range Status  03/28/2015 21 11 - 38 U/L Final         Passed - ALT in normal range and within 360 days    ALT  Date Value Ref Range Status  12/29/2022 10 0 - 32 IU/L Final   ALT (SGPT) Piccolo, Waived  Date Value Ref Range Status  03/28/2015 22 10 - 47 U/L Final         Passed - Valid encounter within last 6 months    Recent Outpatient Visits           1 month ago Benign hypertensive renal disease   [place] [facility] [place], Megan P, DO   6 months ago Type 2 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 disease, without long-term current use of insulin (HCC)   [place] [place], Megan P, DO   9 months ago Routine general medical examination at a health care facility   [facility] [place], [place] P, DO   1 year ago Encounter for Medicare annual wellness exam   [facility] [place], [place] P, DO   1 year ago Urinary symptom or sign   [place] [facility] Marjie Skiff, NP       Future Appointments             In 1 month Johnson, Oralia Rud, DO [place] [facility], [facility]

## 2023-03-31 ENCOUNTER — Ambulatory Visit: Payer: Medicare HMO | Admitting: Family Medicine

## 2023-03-31 VITALS — BP 158/72 | HR 76 | Wt 149.6 lb

## 2023-03-31 DIAGNOSIS — J069 Acute upper respiratory infection, unspecified: Secondary | ICD-10-CM | POA: Diagnosis not present

## 2023-03-31 DIAGNOSIS — E119 Type 2 diabetes mellitus without complications: Secondary | ICD-10-CM | POA: Diagnosis not present

## 2023-03-31 DIAGNOSIS — I129 Hypertensive chronic kidney disease with stage 1 through stage 4 chronic kidney disease, or unspecified chronic kidney disease: Secondary | ICD-10-CM

## 2023-03-31 DIAGNOSIS — Z7984 Long term (current) use of oral hypoglycemic drugs: Secondary | ICD-10-CM

## 2023-03-31 DIAGNOSIS — N182 Chronic kidney disease, stage 2 (mild): Secondary | ICD-10-CM | POA: Diagnosis not present

## 2023-03-31 DIAGNOSIS — E1122 Type 2 diabetes mellitus with diabetic chronic kidney disease: Secondary | ICD-10-CM

## 2023-03-31 DIAGNOSIS — M549 Dorsalgia, unspecified: Secondary | ICD-10-CM | POA: Diagnosis not present

## 2023-03-31 LAB — BAYER DCA HB A1C WAIVED: HB A1C (BAYER DCA - WAIVED): 6.9 % — ABNORMAL HIGH (ref 4.8–5.6)

## 2023-03-31 MED ORDER — HYDROCHLOROTHIAZIDE 25 MG PO TABS: 25.0000 mg | ORAL_TABLET | Freq: Every day | ORAL | 3 refills | Status: DC

## 2023-03-31 MED ORDER — METFORMIN HCL 1000 MG PO TABS: 1000.0000 mg | ORAL_TABLET | Freq: Two times a day (BID) | ORAL | 1 refills | Status: DC

## 2023-03-31 MED ORDER — BUPROPION HCL ER (XL) 150 MG PO TB24: 150.0000 mg | ORAL_TABLET | Freq: Every morning | ORAL | 1 refills | Status: DC

## 2023-03-31 MED ORDER — MECLIZINE HCL 25 MG PO TABS: 25.0000 mg | ORAL_TABLET | Freq: Three times a day (TID) | ORAL | 6 refills | Status: DC | PRN

## 2023-03-31 MED ORDER — PREDNISONE 50 MG PO TABS: 50.0000 mg | ORAL_TABLET | Freq: Every day | ORAL | 0 refills | Status: DC

## 2023-03-31 MED ORDER — LISINOPRIL 30 MG PO TABS: 30.0000 mg | ORAL_TABLET | Freq: Every day | ORAL | 2 refills | Status: DC

## 2023-03-31 NOTE — Assessment & Plan Note (Signed)
 Running high. Will increase her lisinopril to 30mg  and recheck in 1 month call with any concerns.

## 2023-03-31 NOTE — Progress Notes (Signed)
 BP (!) 158/72   Pulse 76   Wt 149 lb 9.6 oz (67.9 kg)   SpO2 98%   BMI 24.15 kg/m    Subjective:    Patient ID: Alison Hernandez, female    DOB: 09-11-1945, 78 y.o.   MRN: [id number]  HPI: Alison Hernandez is a 78 y.o. female  Chief Complaint  Patient presents with   Diabetes    Patient declines having a recent Diabetic Eye Exam.    DIABETES Hypoglycemic episodes:no Polydipsia/polyuria: no Visual disturbance: no Chest pain: no Paresthesias: no Glucose Monitoring: yes  Accucheck frequency:  occasionally Taking Insulin ?: no Blood Pressure Monitoring: not checking Retinal Examination: Not up to Date Foot Exam: Up to Date Diabetic Education: Completed Pneumovax: Up to Date Influenza: Up to Date Aspirin : yes  HYPERTENSION  Hypertension status: uncontrolled  Satisfied with current treatment? no Duration of hypertension: chronic BP monitoring frequency:  not checking BP medication side effects:  no Medication compliance: excellent compliance Previous BP meds:lisinopril , HCTZ Aspirin : no Recurrent headaches: no Visual changes: no Palpitations: no Dyspnea: no Chest pain: no Lower extremity edema: no Dizzy/lightheaded: no  UPPER RESPIRATORY TRACT INFECTION Duration: 2 weeks Worst symptom: cough Fever: no Cough: yes Shortness of breath: no Wheezing: no Chest pain: no Chest tightness: no Chest congestion: no Nasal congestion: yes Runny nose: yes Post nasal drip: yes Sneezing: no Sore throat: no Swollen glands: no Sinus pressure: no Headache: no Face pain: no Toothache: no Ear pain: no  Ear pressure: no  Eyes red/itching:no Eye drainage/crusting: no  Vomiting: no Rash: no Fatigue: yes Sick contacts: yes Strep contacts: no  Context: better Recurrent sinusitis: no Relief with OTC cold/cough medications: no  Treatments attempted: delsym   Relevant past medical, surgical, family and social history reviewed and updated as indicated. Interim medical  history since our last visit reviewed. Allergies and medications reviewed and updated.  Review of Systems  Constitutional: Negative.   Respiratory: Negative.    Cardiovascular: Negative.   Musculoskeletal: Negative.   Skin: Negative.   Neurological: Negative.   Psychiatric/Behavioral: Negative.      Per HPI unless specifically indicated above     Objective:    BP (!) 158/72   Pulse 76   Wt 149 lb 9.6 oz (67.9 kg)   SpO2 98%   BMI 24.15 kg/m   Wt Readings from Last 3 Encounters:  03/31/23 149 lb 9.6 oz (67.9 kg)  12/29/22 148 lb 3.2 oz (67.2 kg)  07/22/22 154 lb 9.6 oz (70.1 kg)    Physical Exam Vitals and nursing note reviewed.  Constitutional:      General: She is not in acute distress.    Appearance: Normal appearance. She is not ill-appearing, toxic-appearing or diaphoretic.  HENT:     Head: Normocephalic and atraumatic.     Right Ear: Tympanic membrane, ear canal and external ear normal.     Left Ear: Tympanic membrane, ear canal and external ear normal.     Nose: Nose normal. No congestion or rhinorrhea.     Mouth/Throat:     Mouth: Mucous membranes are moist.     Pharynx: Oropharynx is clear. No oropharyngeal exudate or posterior oropharyngeal erythema.  Eyes:     General: No scleral icterus.       Right eye: No discharge.        Left eye: No discharge.     Extraocular Movements: Extraocular movements intact.     Conjunctiva/sclera: Conjunctivae normal.     Pupils: Pupils  are equal, round, and reactive to light.  Cardiovascular:     Rate and Rhythm: Normal rate and regular rhythm.     Pulses: Normal pulses.     Heart sounds: Normal heart sounds. No murmur heard.    No friction rub. No gallop.  Pulmonary:     Effort: Pulmonary effort is normal. No respiratory distress.     Breath sounds: Normal breath sounds. No stridor. No wheezing, rhonchi or rales.  Chest:     Chest wall: No tenderness.  Musculoskeletal:        General: Normal range of motion.      Cervical back: Normal range of motion and neck supple.  Skin:    General: Skin is warm and dry.     Capillary Refill: Capillary refill takes less than 2 seconds.     Coloration: Skin is not jaundiced or pale.     Findings: No bruising, erythema, lesion or rash.  Neurological:     General: No focal deficit present.     Mental Status: She is alert and oriented to person, place, and time. Mental status is at baseline.  Psychiatric:        Mood and Affect: Mood normal.        Behavior: Behavior normal.        Thought Content: Thought content normal.        Judgment: Judgment normal.     Results for orders placed or performed in visit on 03/31/23  Bayer DCA Hb A1c Waived   Collection Time: 03/31/23  9:06 AM  Result Value Ref Range   HB A1C (BAYER DCA - WAIVED) 6.9 (H) 4.8 - 5.6 %      Assessment & Plan:   Problem List Items Addressed This Visit       Endocrine   Diabetes (HCC) - Primary   Stable with A1c of 6.9. Continue current regimen. Continue to monitor. Call with any concerns.       Relevant Medications   lisinopril  (ZESTRIL ) 30 MG tablet   metFORMIN  (GLUCOPHAGE ) 1000 MG tablet   Other Relevant Orders   Bayer DCA Hb A1c Waived (Completed)     Genitourinary   Benign hypertensive renal disease   Running high. Will increase her lisinopril  to 30mg  and recheck in 1 month call with any concerns.       Other Visit Diagnoses       Upper back pain       Will refer to PT. Call with any concerns.   Relevant Medications   predniSONE  (DELTASONE ) 50 MG tablet   Other Relevant Orders   Ambulatory referral to Physical Therapy     Upper respiratory tract infection, unspecified type       No sign of bacterial infection. Will treat with burst of prednisone  for symptomatic care. Call if not getting better or getting worse.        Follow up plan: Return in about 4 weeks (around 04/28/2023) for physical.

## 2023-03-31 NOTE — Assessment & Plan Note (Signed)
Stable with A1c of 6.9. Continue current regimen. Continue to monitor. Call with any concerns.

## 2023-04-12 ENCOUNTER — Other Ambulatory Visit: Payer: Self-pay | Admitting: Family Medicine

## 2023-04-13 NOTE — Telephone Encounter (Signed)
Requested Prescriptions  Pending Prescriptions Disp Refills   TRULICITY 4.5 MG/0.5ML SOAJ [Pharmacy Med Name: Trulicity Subcutaneous Solution Auto-injector 4.5 MG/0.5ML] 6 mL 0    Sig: INJECT 4.5MG  (0.5ML) UNDER THE SKIN ONCE A WEEK     Endocrinology:  Diabetes - GLP-1 Receptor Agonists Passed - 04/13/2023  1:31 PM      Passed - HBA1C is between 0 and 7.9 and within 180 days    Hemoglobin A1C  Date Value Ref Range Status  11/28/2015 7.5  Final   HB A1C (BAYER DCA - WAIVED)  Date Value Ref Range Status  03/31/2023 6.9 (H) 4.8 - 5.6 % Final    Comment:             Prediabetes: 5.7 - 6.4          Diabetes: >6.4          Glycemic control for adults with diabetes: <7.0          Passed - Valid encounter within last 6 months    Recent Outpatient Visits           1 week ago Type 2 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 disease, without long-term current use of insulin (HCC)   [place] [facility] [place], Megan P, DO   3 months ago Benign hypertensive renal disease   [place] [facility] [place], Megan P, DO   8 months ago Type 2 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 disease, without long-term current use of insulin (HCC)   [place] [facility] [place], Megan P, DO   11 months ago Routine general medical examination at a health care facility   [facility] [place], [place] P, DO   1 year ago Encounter for [employer] annual wellness exam   [place] [facility] Dorcas Carrow, DO       Future Appointments             In 3 weeks Laural Benes, Oralia Rud, DO [facility]

## 2023-04-20 DIAGNOSIS — S335XXA Sprain of ligaments of lumbar spine, initial encounter: Secondary | ICD-10-CM | POA: Diagnosis not present

## 2023-05-08 ENCOUNTER — Encounter: Payer: Self-pay | Admitting: Family Medicine

## 2023-05-18 ENCOUNTER — Encounter: Payer: Medicare HMO | Admitting: Family Medicine

## 2023-05-18 ENCOUNTER — Other Ambulatory Visit: Payer: Self-pay | Admitting: Family Medicine

## 2023-05-19 NOTE — Telephone Encounter (Signed)
 Requested Prescriptions  Refused Prescriptions Disp Refills   omeprazole (PRILOSEC) 20 MG capsule [Pharmacy Med Name: OMEPRAZOLE DR 20 MG CAPSULE 20 Capsule] 60 capsule 10    Sig: TAKE 2 CAPSULE BY MOUTH EVERY MORNING     Gastroenterology: Proton Pump Inhibitors Passed - 05/19/2023  5:21 PM      Passed - Valid encounter within last 12 months    Recent Outpatient Visits           1 month ago Type 2 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 disease, without long-term current use of insulin (HCC)   [place] [facility] [place], Megan P, DO   4 months ago Benign hypertensive renal disease   [place] [facility] [place], Megan P, DO   10 months ago Type 2 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 disease, without long-term current use of insulin (HCC)   [place] [facility] [place], Megan P, DO   1 year ago Routine general medical examination at a health care facility   [facility] [place], Megan P, DO   1 year ago Encounter for Medicare annual wellness exam   [place] [facility] [place], Megan P, DO               alendronate (FOSAMAX) 70 MG tablet [Pharmacy Med Name: ALENDRONATE NA 70MG  TAB 70 Tablet] 4 tablet 10    Sig: TAKE 1 TABLET BY MOUTH ONCE WEEKLY BEFORE THE FIRST FOOD, BEVERAGE OR MEDICINE OF THE DAY WITH PLAIN WATER     Endocrinology:  Bisphosphonates Failed - 05/19/2023  5:21 PM      Failed - Vitamin D in normal range and within 360 days    No results found for: "UJ8119JY7", "WG9562ZH0", "VD125OH2TOT", "25OHVITD3", "25OHVITD2", "25OHVITD1", "VD25OH"       Failed - Cr in normal range and within 360 days    Creatinine, Ser  Date Value Ref Range Status  12/29/2022 1.21 (H) 0.57 - 1.00 mg/dL Final         Failed - Mg Level in normal range and within 360 days    No results found for: "MG"       Failed - Phosphate in normal range and within 360 days    No results found for:  "PHOS"       Passed - Ca in normal range and within 360 days    Calcium  Date Value Ref Range Status  12/29/2022 9.2 8.7 - 10.3 mg/dL Final         Passed - eGFR is 30 or above and within 360 days    GFR calc Af Amer  Date Value Ref Range Status  04/04/2020 48 (L) >59 mL/min/1.73 Final    Comment:    **In accordance with recommendations from the NKF-ASN Task force,**   [facility] is in the process of updating its eGFR calculation to the   2021 CKD-EPI creatinine equation that estimates kidney function   without a race variable.    GFR calc non Af Amer  Date Value Ref Range Status  04/04/2020 42 (L) >59 mL/min/1.73 Final   eGFR  Date Value Ref Range Status  12/29/2022 47 (L) >59 mL/min/1.73 Final         Passed - Valid encounter within last 12 months    Recent Outpatient Visits           1 month ago Type 2 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 disease, without  long-term current use of insulin (HCC)   [place] [facility] [place], [place] P, DO   4 months ago Benign hypertensive renal disease   [place] [facility] [place], Megan P, DO   10 months ago Type 2 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 disease, without long-term current use of insulin (HCC)   [place] [facility] [place], Megan P, DO   1 year ago Routine general medical examination at a health care facility   [facility] [place], Oralia Rud, DO   1 year ago Encounter for Medicare annual wellness exam   [place] [facility] [place], Megan P, DO              Passed - Bone Mineral Density or Dexa Scan completed in the last 2 years

## 2023-06-16 ENCOUNTER — Other Ambulatory Visit: Payer: Self-pay | Admitting: Family Medicine

## 2023-06-18 NOTE — Telephone Encounter (Signed)
 Requested medication (s) are due for refill today: yes  Requested medication (s) are on the active medication list: yes  Last refill:  02/16/23  Future visit scheduled: no  Notes to clinic:  Unable to refill per protocol, cannot delegate.      Requested Prescriptions  Pending Prescriptions Disp Refills   ondansetron (ZOFRAN-ODT) 8 MG disintegrating tablet [Pharmacy Med Name: ONDANSETRON 8MG  ODT 8 ODT] 60 tablet 10    Sig: PLACE 1 TABLET ON TONGUE AND ALLOW TO DISSOLVE EVERY 8 HOURS AS NEEDED FOR NAUSEA & VOMITING     Not Delegated - Gastroenterology: Antiemetics - ondansetron Failed - 06/18/2023 11:54 AM      Failed - This refill cannot be delegated      Failed - Valid encounter within last 6 months    Recent Outpatient Visits   None            Passed - AST in normal range and within 360 days    AST  Date Value Ref Range Status  12/29/2022 12 0 - 40 IU/L Final   AST (SGOT) Piccolo, Waived  Date Value Ref Range Status  03/28/2015 21 11 - 38 U/L Final         Passed - ALT in normal range and within 360 days    ALT  Date Value Ref Range Status  12/29/2022 10 0 - 32 IU/L Final   ALT (SGPT) Piccolo, Waived  Date Value Ref Range Status  03/28/2015 22 10 - 47 U/L Final         Signed Prescriptions Disp Refills   lisinopril-hydrochlorothiazide (ZESTORETIC) 20-25 MG tablet 90 tablet 0    Sig: TAKE 1 TABLET BY MOUTH EVERY MORNING     Cardiovascular:  ACEI + Diuretic Combos Failed - 06/18/2023 11:54 AM      Failed - Cr in normal range and within 180 days    Creatinine, Ser  Date Value Ref Range Status  12/29/2022 1.21 (H) 0.57 - 1.00 mg/dL Final         Failed - Last BP in normal range    BP Readings from Last 1 Encounters:  03/31/23 (!) 158/72         Failed - Valid encounter within last 6 months    Recent Outpatient Visits   None            Passed - Na in normal range and within 180 days    Sodium  Date Value Ref Range Status  12/29/2022 138 134 -  144 mmol/L Final         Passed - K in normal range and within 180 days    Potassium  Date Value Ref Range Status  12/29/2022 4.2 3.5 - 5.2 mmol/L Final         Passed - eGFR is 30 or above and within 180 days    GFR calc Af Amer  Date Value Ref Range Status  04/04/2020 48 (L) >59 mL/min/1.73 Final    Comment:    **In accordance with recommendations from the NKF-ASN Task force,**   [facility] is in the process of updating its eGFR calculation to the   2021 CKD-EPI creatinine equation that estimates kidney function   without a race variable.    GFR calc non Af Amer  Date Value Ref Range Status  04/04/2020 42 (L) >59 mL/min/1.73 Final   eGFR  Date Value Ref Range Status  12/29/2022 47 (L) >59 mL/min/1.73 Final  Passed - Patient is not pregnant

## 2023-06-18 NOTE — Telephone Encounter (Signed)
 Requested Prescriptions  Pending Prescriptions Disp Refills   lisinopril-hydrochlorothiazide (ZESTORETIC) 20-25 MG tablet [Pharmacy Med Name: LISINOPRIL-HCTZ 20-25 MG TA 20-25 Tablet] 90 tablet 0    Sig: TAKE 1 TABLET BY MOUTH EVERY MORNING     Cardiovascular:  ACEI + Diuretic Combos Failed - 06/18/2023 11:53 AM      Failed - Cr in normal range and within 180 days    Creatinine, Ser  Date Value Ref Range Status  12/29/2022 1.21 (H) 0.57 - 1.00 mg/dL Final         Failed - Last BP in normal range    BP Readings from Last 1 Encounters:  03/31/23 (!) 158/72         Failed - Valid encounter within last 6 months    Recent Outpatient Visits   None            Passed - Na in normal range and within 180 days    Sodium  Date Value Ref Range Status  12/29/2022 138 134 - 144 mmol/L Final         Passed - K in normal range and within 180 days    Potassium  Date Value Ref Range Status  12/29/2022 4.2 3.5 - 5.2 mmol/L Final         Passed - eGFR is 30 or above and within 180 days    GFR calc Af Amer  Date Value Ref Range Status  04/04/2020 48 (L) >59 mL/min/1.73 Final    Comment:    **In accordance with recommendations from the NKF-ASN Task force,**   [facility] is in the process of updating its eGFR calculation to the   2021 CKD-EPI creatinine equation that estimates kidney function   without a race variable.    GFR calc non Af Amer  Date Value Ref Range Status  04/04/2020 42 (L) >59 mL/min/1.73 Final   eGFR  Date Value Ref Range Status  12/29/2022 47 (L) >59 mL/min/1.73 Final         Passed - Patient is not pregnant       ondansetron (ZOFRAN-ODT) 8 MG disintegrating tablet [Pharmacy Med Name: ONDANSETRON 8MG  ODT 8 ODT] 60 tablet 10    Sig: PLACE 1 TABLET ON TONGUE AND ALLOW TO DISSOLVE EVERY 8 HOURS AS NEEDED FOR NAUSEA & VOMITING     Not Delegated - Gastroenterology: Antiemetics - ondansetron Failed - 06/18/2023 11:53 AM      Failed - This refill cannot be delegated       Failed - Valid encounter within last 6 months    Recent Outpatient Visits   None            Passed - AST in normal range and within 360 days    AST  Date Value Ref Range Status  12/29/2022 12 0 - 40 IU/L Final   AST (SGOT) Piccolo, Waived  Date Value Ref Range Status  03/28/2015 21 11 - 38 U/L Final         Passed - ALT in normal range and within 360 days    ALT  Date Value Ref Range Status  12/29/2022 10 0 - 32 IU/L Final   ALT (SGPT) Piccolo, Waived  Date Value Ref Range Status  03/28/2015 22 10 - 47 U/L Final

## 2023-06-30 ENCOUNTER — Other Ambulatory Visit: Payer: Self-pay | Admitting: Family Medicine

## 2023-07-01 NOTE — Telephone Encounter (Signed)
 Requested Prescriptions  Pending Prescriptions Disp Refills   lisinopril (ZESTRIL) 30 MG tablet [Pharmacy Med Name: Lisinopril 30 MG Oral Tablet] 90 tablet 0    Sig: Take 1 tablet by mouth once daily     Cardiovascular:  ACE Inhibitors Failed - 07/01/2023 11:01 AM      Failed - Cr in normal range and within 180 days    Creatinine, Ser  Date Value Ref Range Status  12/29/2022 1.21 (H) 0.57 - 1.00 mg/dL Final         Failed - K in normal range and within 180 days    Potassium  Date Value Ref Range Status  12/29/2022 4.2 3.5 - 5.2 mmol/L Final         Failed - Last BP in normal range    BP Readings from Last 1 Encounters:  03/31/23 (!) 158/72         Failed - Valid encounter within last 6 months    Recent Outpatient Visits   None            Passed - Patient is not pregnant

## 2023-07-08 ENCOUNTER — Other Ambulatory Visit: Payer: Self-pay

## 2023-07-10 NOTE — Telephone Encounter (Addendum)
 Over due for follow up. Will get her refill to make it to her next appointment when scheduled.

## 2023-07-15 ENCOUNTER — Other Ambulatory Visit: Payer: Self-pay | Admitting: Family Medicine

## 2023-07-16 NOTE — Telephone Encounter (Signed)
 Requested Prescriptions  Pending Prescriptions Disp Refills   rosuvastatin  (CRESTOR ) 10 MG tablet [Pharmacy Med Name: ROSUVASTATIN  10 MG TAB 10 Tablet] 90 tablet 1    Sig: TAKE 1 TABLET (10 MG TOTAL) BY MOUTH DAILY.     Cardiovascular:  Antilipid - Statins 2 Failed - 07/16/2023  4:10 PM      Failed - Cr in normal range and within 360 days    Creatinine, Ser  Date Value Ref Range Status  12/29/2022 1.21 (H) 0.57 - 1.00 mg/dL Final         Failed - Valid encounter within last 12 months    Recent Outpatient Visits   None            Failed - Lipid Panel in normal range within the last 12 months    Cholesterol, Total  Date Value Ref Range Status  12/29/2022 149 100 - 199 mg/dL Final   Cholesterol Piccolo, Waived  Date Value Ref Range Status  10/25/2015 228 (H) <200 mg/dL Final    Comment:                            Desirable                <200                         Borderline High      200- 239                         High                     >239    LDL Chol Calc (NIH)  Date Value Ref Range Status  12/29/2022 68 0 - 99 mg/dL Final   HDL  Date Value Ref Range Status  12/29/2022 60 >39 mg/dL Final   Triglycerides  Date Value Ref Range Status  12/29/2022 117 0 - 149 mg/dL Final   Triglycerides Piccolo,Waived  Date Value Ref Range Status  10/25/2015 162 (H) <150 mg/dL Final    Comment:                            Normal                   <150                         Borderline High     150 - 199                         High                200 - 499                         Very High                >499          Passed - Patient is not pregnant

## 2023-07-23 ENCOUNTER — Other Ambulatory Visit: Payer: Self-pay | Admitting: Family Medicine

## 2023-07-24 NOTE — Telephone Encounter (Signed)
 Called patient to schedule an appointment.Left message on machine to call the office and schedule an appointment in order to get refill

## 2023-07-27 NOTE — Telephone Encounter (Signed)
 Too soon for refill, last refill 03/31/23 for 90 and 1 refill.  Requested Prescriptions  Pending Prescriptions Disp Refills   metFORMIN  (GLUCOPHAGE ) 1000 MG tablet [Pharmacy Med Name: METFORMIN  1000MG  TABLET Tablet] 60 tablet 11    Sig: TAKE 1 TABLET BY MOUTH TWICE DAILY     Endocrinology:  Diabetes - Biguanides Failed - 07/27/2023 12:32 PM      Failed - Cr in normal range and within 360 days    Creatinine, Ser  Date Value Ref Range Status  12/29/2022 1.21 (H) 0.57 - 1.00 mg/dL Final         Failed - eGFR in normal range and within 360 days    GFR calc Af Amer  Date Value Ref Range Status  04/04/2020 48 (L) >59 mL/min/1.73 Final    Comment:    **In accordance with recommendations from the NKF-ASN Task force,**   [facility] is in the process of updating its eGFR calculation to the   2021 CKD-EPI creatinine equation that estimates kidney function   without a race variable.    GFR calc non Af Amer  Date Value Ref Range Status  04/04/2020 42 (L) >59 mL/min/1.73 Final   eGFR  Date Value Ref Range Status  12/29/2022 47 (L) >59 mL/min/1.73 Final         Failed - B12 Level in normal range and within 720 days    No results found for: "VITAMINB12"       Failed - Valid encounter within last 6 months    Recent Outpatient Visits   None            Passed - HBA1C is between 0 and 7.9 and within 180 days    Hemoglobin A1C  Date Value Ref Range Status  11/28/2015 7.5  Final   HB A1C (BAYER DCA - WAIVED)  Date Value Ref Range Status  03/31/2023 6.9 (H) 4.8 - 5.6 % Final    Comment:             Prediabetes: 5.7 - 6.4          Diabetes: >6.4          Glycemic control for adults with diabetes: <7.0          Passed - CBC within normal limits and completed in the last 12 months    WBC  Date Value Ref Range Status  12/29/2022 4.6 3.4 - 10.8 x10E3/uL Final   RBC  Date Value Ref Range Status  12/29/2022 3.42 (L) 3.77 - 5.28 x10E6/uL Final   Hemoglobin  Date Value Ref Range  Status  12/29/2022 10.4 (L) 11.1 - 15.9 g/dL Final   Hematocrit  Date Value Ref Range Status  12/29/2022 31.9 (L) 34.0 - 46.6 % Final   MCHC  Date Value Ref Range Status  12/29/2022 32.6 31.5 - 35.7 g/dL Final   [facility]  Date Value Ref Range Status  12/29/2022 30.4 26.6 - 33.0 pg Final   MCV  Date Value Ref Range Status  12/29/2022 93 79 - 97 fL Final   No results found for: "PLTCOUNTKUC", "LABPLAT", "POCPLA" RDW  Date Value Ref Range Status  12/29/2022 13.1 11.7 - 15.4 % Final          buPROPion  (WELLBUTRIN  XL) 150 MG 24 hr tablet [Pharmacy Med Name: BUPROPION  XL 150MG  TAB 150 Tablet] 30 tablet 11    Sig: TAKE 1 TABLET BY MOUTH EVERY MORNING     Psychiatry: Antidepressants - bupropion  Failed - 07/27/2023 12:32 PM  Failed - Cr in normal range and within 360 days    Creatinine, Ser  Date Value Ref Range Status  12/29/2022 1.21 (H) 0.57 - 1.00 mg/dL Final         Failed - Last BP in normal range    BP Readings from Last 1 Encounters:  03/31/23 (!) 158/72         Failed - Valid encounter within last 6 months    Recent Outpatient Visits   None            Passed - AST in normal range and within 360 days    AST  Date Value Ref Range Status  12/29/2022 12 0 - 40 IU/L Final   AST (SGOT) Piccolo, Waived  Date Value Ref Range Status  03/28/2015 21 11 - 38 U/L Final         Passed - ALT in normal range and within 360 days    ALT  Date Value Ref Range Status  12/29/2022 10 0 - 32 IU/L Final   ALT (SGPT) Piccolo, Waived  Date Value Ref Range Status  03/28/2015 22 10 - 47 U/L Final         Passed - Completed PHQ-2 or PHQ-9 in the last 360 days

## 2023-08-10 ENCOUNTER — Other Ambulatory Visit: Payer: Self-pay | Admitting: Family Medicine

## 2023-08-12 NOTE — Telephone Encounter (Signed)
 Reordered  03/31/23 #360 1 RF  Requested Prescriptions  Refused Prescriptions Disp Refills   metFORMIN  (GLUCOPHAGE ) 1000 MG tablet [Pharmacy Med Name: METFORMIN  1000MG  TABLET Tablet] 60 tablet 11    Sig: TAKE 1 TABLET BY MOUTH TWICE DAILY     Endocrinology:  Diabetes - Biguanides Failed - 08/12/2023 10:00 AM      Failed - Cr in normal range and within 360 days    Creatinine, Ser  Date Value Ref Range Status  12/29/2022 1.21 (H) 0.57 - 1.00 mg/dL Final         Failed - eGFR in normal range and within 360 days    GFR calc Af Amer  Date Value Ref Range Status  04/04/2020 48 (L) >59 mL/min/1.73 Final    Comment:    **In accordance with recommendations from the NKF-ASN Task force,**   [facility] is in the process of updating its eGFR calculation to the   2021 CKD-EPI creatinine equation that estimates kidney function   without a race variable.    GFR calc non Af Amer  Date Value Ref Range Status  04/04/2020 42 (L) >59 mL/min/1.73 Final   eGFR  Date Value Ref Range Status  12/29/2022 47 (L) >59 mL/min/1.73 Final         Failed - B12 Level in normal range and within 720 days    No results found for: "VITAMINB12"       Failed - Valid encounter within last 6 months    Recent Outpatient Visits   None            Passed - HBA1C is between 0 and 7.9 and within 180 days    Hemoglobin A1C  Date Value Ref Range Status  11/28/2015 7.5  Final   HB A1C (BAYER DCA - WAIVED)  Date Value Ref Range Status  03/31/2023 6.9 (H) 4.8 - 5.6 % Final    Comment:             Prediabetes: 5.7 - 6.4          Diabetes: >6.4          Glycemic control for adults with diabetes: <7.0          Passed - CBC within normal limits and completed in the last 12 months    WBC  Date Value Ref Range Status  12/29/2022 4.6 3.4 - 10.8 x10E3/uL Final   RBC  Date Value Ref Range Status  12/29/2022 3.42 (L) 3.77 - 5.28 x10E6/uL Final   Hemoglobin  Date Value Ref Range Status  12/29/2022 10.4 (L) 11.1 -  15.9 g/dL Final   Hematocrit  Date Value Ref Range Status  12/29/2022 31.9 (L) 34.0 - 46.6 % Final   MCHC  Date Value Ref Range Status  12/29/2022 32.6 31.5 - 35.7 g/dL Final   [facility]  Date Value Ref Range Status  12/29/2022 30.4 26.6 - 33.0 pg Final   MCV  Date Value Ref Range Status  12/29/2022 93 79 - 97 fL Final   No results found for: "PLTCOUNTKUC", "LABPLAT", "POCPLA" RDW  Date Value Ref Range Status  12/29/2022 13.1 11.7 - 15.4 % Final

## 2023-08-14 ENCOUNTER — Other Ambulatory Visit: Payer: Self-pay

## 2023-08-14 MED ORDER — HYDROCHLOROTHIAZIDE 25 MG PO TABS: 25.0000 mg | ORAL_TABLET | Freq: Every day | ORAL | 0 refills | Status: DC

## 2023-08-18 ENCOUNTER — Other Ambulatory Visit: Payer: Self-pay | Admitting: Family Medicine

## 2023-08-18 NOTE — Telephone Encounter (Unsigned)
 Copied from CRM [id number]. Topic: Clinical - Medication Refill >> Aug 18, 2023  1:48 PM Elle L wrote: Medication: metFORMIN  (GLUCOPHAGE ) 1000 MG tablet  Has the patient contacted their pharmacy? Yes  This is the patient's preferred pharmacy:   [facility] [place]  Dorrene Gaucher, [place] - [address] [place] [address] Phone: [phone] Fax: [phone]  Is this the correct pharmacy for this prescription? Yes  Has the prescription been filled recently? Yes  Is the patient out of the medication? Yes  Has the patient been seen for an appointment in the last year OR does the patient have an upcoming appointment? Yes  Can we respond through MyChart? No  Agent: Please be advised that Rx refills may take up to 3 business days. We ask that you follow-up with your pharmacy.

## 2023-08-20 ENCOUNTER — Ambulatory Visit (INDEPENDENT_AMBULATORY_CARE_PROVIDER_SITE_OTHER): Admitting: Family Medicine

## 2023-08-20 ENCOUNTER — Encounter: Payer: Self-pay | Admitting: Family Medicine

## 2023-08-20 VITALS — BP 160/80 | HR 78 | Ht 66.0 in | Wt 159.4 lb

## 2023-08-20 DIAGNOSIS — N182 Chronic kidney disease, stage 2 (mild): Secondary | ICD-10-CM | POA: Diagnosis not present

## 2023-08-20 DIAGNOSIS — E1122 Type 2 diabetes mellitus with diabetic chronic kidney disease: Secondary | ICD-10-CM

## 2023-08-20 DIAGNOSIS — I129 Hypertensive chronic kidney disease with stage 1 through stage 4 chronic kidney disease, or unspecified chronic kidney disease: Secondary | ICD-10-CM | POA: Diagnosis not present

## 2023-08-20 DIAGNOSIS — Z7985 Long-term (current) use of injectable non-insulin antidiabetic drugs: Secondary | ICD-10-CM

## 2023-08-20 DIAGNOSIS — E782 Mixed hyperlipidemia: Secondary | ICD-10-CM | POA: Diagnosis not present

## 2023-08-20 LAB — BAYER DCA HB A1C WAIVED: HB A1C (BAYER DCA - WAIVED): 8.5 % — ABNORMAL HIGH (ref 4.8–5.6)

## 2023-08-20 MED ORDER — TRULICITY 4.5 MG/0.5ML ~~LOC~~ SOAJ: 4.5000 mg | SUBCUTANEOUS | 0 refills | Status: DC

## 2023-08-20 MED ORDER — LISINOPRIL 40 MG PO TABS: 40.0000 mg | ORAL_TABLET | Freq: Every day | ORAL | 0 refills | Status: DC

## 2023-08-20 MED ORDER — OMEPRAZOLE 20 MG PO CPDR: 20.0000 mg | DELAYED_RELEASE_CAPSULE | Freq: Two times a day (BID) | ORAL | 1 refills | Status: DC

## 2023-08-20 MED ORDER — METFORMIN HCL 1000 MG PO TABS: 1000.0000 mg | ORAL_TABLET | Freq: Two times a day (BID) | ORAL | 1 refills | Status: DC

## 2023-08-20 MED ORDER — BUPROPION HCL ER (XL) 150 MG PO TB24: 150.0000 mg | ORAL_TABLET | Freq: Every morning | ORAL | 1 refills | Status: DC

## 2023-08-20 MED ORDER — ROSUVASTATIN CALCIUM 10 MG PO TABS: 10.0000 mg | ORAL_TABLET | Freq: Every day | ORAL | 1 refills | Status: DC

## 2023-08-20 MED ORDER — TRULICITY 0.75 MG/0.5ML ~~LOC~~ SOAJ: 0.7500 mg | SUBCUTANEOUS | 0 refills | Status: DC

## 2023-08-20 MED ORDER — TRULICITY 3 MG/0.5ML ~~LOC~~ SOAJ: 3.0000 mg | SUBCUTANEOUS | 0 refills | Status: DC

## 2023-08-20 MED ORDER — HYDROCHLOROTHIAZIDE 25 MG PO TABS: 25.0000 mg | ORAL_TABLET | Freq: Every day | ORAL | 1 refills | Status: DC

## 2023-08-20 MED ORDER — TRULICITY 1.5 MG/0.5ML ~~LOC~~ SOAJ: 1.5000 mg | SUBCUTANEOUS | 0 refills | Status: DC

## 2023-08-20 NOTE — Progress Notes (Signed)
 BP (!) 160/80 (BP Location: Left Arm, Patient Position: Sitting, Cuff Size: Normal) Comment: Has been out of bp meds  Pulse 78   Ht 5\' 6"  (1.676 m)   Wt 159 lb 6.4 oz (72.3 kg)   SpO2 98%   BMI 25.73 kg/m    Subjective:    Patient ID: Alison Hernandez, female    DOB: 1945/08/31, 78 y.o.   MRN: [id number]  HPI: Alison Hernandez is a 78 y.o. female  Chief Complaint  Patient presents with   Diabetes   Hypertension   DIABETES- has been off her trulicity  Hypoglycemic episodes:no Polydipsia/polyuria: no Visual disturbance: no Chest pain: no Paresthesias: no Glucose Monitoring: yes  Accucheck frequency: daily Taking Insulin?: no Blood Pressure Monitoring: not checking Retinal Examination: Not up to Date Foot Exam: Up to Date Diabetic Education: Completed Pneumovax: Up to Date Influenza: Up to Date Aspirin: yes  HYPERTENSION / HYPERLIPIDEMIA Satisfied with current treatment? yes Duration of hypertension: chronic BP monitoring frequency: not checking BP medication side effects: no Past BP meds: hydrochlorothiazide , lisinopril  Duration of hyperlipidemia: chronic Cholesterol medication side effects: no Cholesterol supplements: none Past cholesterol medications: crestor  Medication compliance: excellent compliance Aspirin: yes Recent stressors: no Recurrent headaches: no Visual changes: no Palpitations: no Dyspnea: no Chest pain: no Lower extremity edema: no Dizzy/lightheaded: no   Relevant past medical, surgical, family and social history reviewed and updated as indicated. Interim medical history since our last visit reviewed. Allergies and medications reviewed and updated.  Review of Systems  Constitutional: Negative.   Respiratory: Negative.    Musculoskeletal: Negative.   Neurological: Negative.   Psychiatric/Behavioral: Negative.      Per HPI unless specifically indicated above     Objective:     BP (!) 160/80 (BP Location: Left Arm, Patient  Position: Sitting, Cuff Size: Normal) Comment: Has been out of bp meds  Pulse 78   Ht 5\' 6"  (1.676 m)   Wt 159 lb 6.4 oz (72.3 kg)   SpO2 98%   BMI 25.73 kg/m   Wt Readings from Last 3 Encounters:  08/20/23 159 lb 6.4 oz (72.3 kg)  03/31/23 149 lb 9.6 oz (67.9 kg)  12/29/22 148 lb 3.2 oz (67.2 kg)    Physical Exam Vitals and nursing note reviewed.  Constitutional:      General: She is not in acute distress.    Appearance: Normal appearance. She is not ill-appearing, toxic-appearing or diaphoretic.  HENT:     Head: Normocephalic and atraumatic.     Right Ear: External ear normal.     Left Ear: External ear normal.     Nose: Nose normal.     Mouth/Throat:     Mouth: Mucous membranes are moist.     Pharynx: Oropharynx is clear.  Eyes:     General: No scleral icterus.       Right eye: No discharge.        Left eye: No discharge.     Extraocular Movements: Extraocular movements intact.     Conjunctiva/sclera: Conjunctivae normal.     Pupils: Pupils are equal, round, and reactive to light.  Cardiovascular:     Rate and Rhythm: Normal rate and regular rhythm.     Pulses: Normal pulses.     Heart sounds: Normal heart sounds. No murmur heard.    No friction rub. No gallop.  Pulmonary:     Effort: Pulmonary effort is normal. No respiratory distress.     Breath sounds: Normal breath sounds. No  stridor. No wheezing, rhonchi or rales.  Chest:     Chest wall: No tenderness.  Musculoskeletal:        General: Normal range of motion.     Cervical back: Normal range of motion and neck supple.  Skin:    General: Skin is warm and dry.     Capillary Refill: Capillary refill takes less than 2 seconds.     Coloration: Skin is not jaundiced or pale.     Findings: No bruising, erythema, lesion or rash.  Neurological:     General: No focal deficit present.     Mental Status: She is alert and oriented to person, place, and time. Mental status is at baseline.  Psychiatric:        Mood and  Affect: Mood normal.        Behavior: Behavior normal.        Thought Content: Thought content normal.        Judgment: Judgment normal.     Results for orders placed or performed in visit on 08/20/23  Bayer DCA Hb A1c Waived   Collection Time: 08/20/23  1:30 PM  Result Value Ref Range   HB A1C (BAYER DCA - WAIVED) 8.5 (H) 4.8 - 5.6 %  Comprehensive metabolic panel with GFR   Collection Time: 08/20/23  1:30 PM  Result Value Ref Range   Glucose 138 (H) 70 - 99 mg/dL   BUN 14 8 - 27 mg/dL   Creatinine, Ser 8.46 0.57 - 1.00 mg/dL   eGFR 63 >96 EX/BMW/4.13   BUN/Creatinine Ratio 15 12 - 28   Sodium 137 134 - 144 mmol/L   Potassium 4.4 3.5 - 5.2 mmol/L   Chloride 101 96 - 106 mmol/L   CO2 21 20 - 29 mmol/L   Calcium  9.3 8.7 - 10.3 mg/dL   Total Protein 6.5 6.0 - 8.5 g/dL   Albumin 4.3 3.8 - 4.8 g/dL   Globulin, Total 2.2 1.5 - 4.5 g/dL   Bilirubin Total 0.4 0.0 - 1.2 mg/dL   Alkaline Phosphatase 92 44 - 121 IU/L   AST 14 0 - 40 IU/L   ALT 10 0 - 32 IU/L  Lipid Panel w/o Chol/HDL Ratio   Collection Time: 08/20/23  1:30 PM  Result Value Ref Range   Cholesterol, Total 212 (H) 100 - 199 mg/dL   Triglycerides 244 0 - 149 mg/dL   HDL 70 >01 mg/dL   VLDL Cholesterol Cal 23 5 - 40 mg/dL   LDL Chol Calc (NIH) 027 (H) 0 - 99 mg/dL      Assessment & Plan:   Problem List Items Addressed This Visit       Endocrine   Diabetes (HCC) - Primary   Not under good control. Has been off her trulicity  for over a month. Will restart and titrate her back up to her 4.5mg . Call with any concerns. Recheck 3 months.       Relevant Medications   lisinopril  (ZESTRIL ) 40 MG tablet   metFORMIN  (GLUCOPHAGE ) 1000 MG tablet   rosuvastatin  (CRESTOR ) 10 MG tablet   Dulaglutide  (TRULICITY ) 0.75 MG/0.5ML SOAJ   Dulaglutide  (TRULICITY ) 1.5 MG/0.5ML SOAJ (Start on 09/17/2023)   Dulaglutide  (TRULICITY ) 3 MG/0.5ML SOAJ (Start on 10/15/2023)   Dulaglutide  (TRULICITY ) 4.5 MG/0.5ML SOAJ (Start on 11/12/2023)    Other Relevant Orders   Bayer DCA Hb A1c Waived (Completed)   Comprehensive metabolic panel with GFR (Completed)   Lipid Panel w/o Chol/HDL Ratio (Completed)   AMB Referral  VBCI Care Management     Genitourinary   Benign hypertensive renal disease   Not under good control. Will increase her lisinopril  to 40mg  and recheck in 3 months. Call with any concerns. Continue to monitor.         Other   Hyperlipidemia   Under good control on current regimen. Continue current regimen. Continue to monitor. Call with any concerns. Refills given. Labs drawn today.        Relevant Medications   hydrochlorothiazide  (HYDRODIURIL ) 25 MG tablet   lisinopril  (ZESTRIL ) 40 MG tablet   rosuvastatin  (CRESTOR ) 10 MG tablet     Follow up plan: Return in about 3 months (around 11/20/2023) for physical.

## 2023-08-21 ENCOUNTER — Telehealth: Payer: Self-pay

## 2023-08-21 LAB — COMPREHENSIVE METABOLIC PANEL WITH GFR
ALT: 10 IU/L (ref 0–32)
AST: 14 IU/L (ref 0–40)
Albumin: 4.3 g/dL (ref 3.8–4.8)
Alkaline Phosphatase: 92 IU/L (ref 44–121)
BUN/Creatinine Ratio: 15 (ref 12–28)
BUN: 14 mg/dL (ref 8–27)
Bilirubin Total: 0.4 mg/dL (ref 0.0–1.2)
CO2: 21 mmol/L (ref 20–29)
Calcium: 9.3 mg/dL (ref 8.7–10.3)
Chloride: 101 mmol/L (ref 96–106)
Creatinine, Ser: 0.94 mg/dL (ref 0.57–1.00)
Globulin, Total: 2.2 g/dL (ref 1.5–4.5)
Glucose: 138 mg/dL — ABNORMAL HIGH (ref 70–99)
Potassium: 4.4 mmol/L (ref 3.5–5.2)
Sodium: 137 mmol/L (ref 134–144)
Total Protein: 6.5 g/dL (ref 6.0–8.5)
eGFR: 63 mL/min/{1.73_m2} (ref >59)

## 2023-08-21 LAB — LIPID PANEL W/O CHOL/HDL RATIO
Cholesterol, Total: 212 mg/dL — ABNORMAL HIGH (ref 100–199)
HDL: 70 mg/dL (ref >39)
LDL Chol Calc (NIH): 119 mg/dL — ABNORMAL HIGH (ref 0–99)
Triglycerides: 133 mg/dL (ref 0–149)
VLDL Cholesterol Cal: 23 mg/dL (ref 5–40)

## 2023-08-21 NOTE — Progress Notes (Signed)
 Care Guide Pharmacy Note  08/21/2023 Name: Alison Hernandez MRN: [id number] DOB: 12/20/1945  Referred By: Solomon Dupre, DO Reason for referral: Complex Care Management (Initial Outreach with [name])   Alison Hernandez is a 78 y.o. year old female who is a primary care patient of Solomon Dupre, DO.  Alison Hernandez was referred to the pharmacist for assistance related to: DMII  An unsuccessful telephone outreach was attempted today to contact the patient who was referred to the pharmacy team for assistance with medication assistance. Additional attempts will be made to contact the patient.  [name] [facility], [facility] Guide  Direct Dial: [phone]  Fax [phone]

## 2023-08-24 NOTE — Assessment & Plan Note (Signed)
 Under good control on current regimen. Continue current regimen. Continue to monitor. Call with any concerns. Refills given. Labs drawn today.

## 2023-08-24 NOTE — Assessment & Plan Note (Signed)
 Not under good control. Has been off her trulicity  for over a month. Will restart and titrate her back up to her 4.5mg . Call with any concerns. Recheck 3 months.

## 2023-08-24 NOTE — Assessment & Plan Note (Signed)
 Not under good control. Will increase her lisinopril  to 40mg  and recheck in 3 months. Call with any concerns. Continue to monitor.

## 2023-08-25 ENCOUNTER — Telehealth: Payer: Self-pay | Admitting: Family Medicine

## 2023-08-25 NOTE — Telephone Encounter (Signed)
 Patient was in office requesting Letter to excuse from [employer]. Please contact at [phone] when complete.

## 2023-08-26 NOTE — Progress Notes (Unsigned)
 Care Guide Pharmacy Note  08/26/2023 Name: Alison Hernandez MRN: [id number] DOB: May 14, 1945  Referred By: Solomon Dupre, DO Reason for referral: Complex Care Management (Initial Outreach with [name])   Alison Hernandez is a 78 y.o. year old female who is a primary care patient of Solomon Dupre, DO.  Alison Hernandez was referred to the pharmacist for assistance related to: DMII  A second unsuccessful telephone outreach was attempted today to contact the patient who was referred to the pharmacy team for assistance with medication assistance. Additional attempts will be made to contact the patient.  [name] [facility], [facility] Guide  Direct Dial: [phone]  Fax [phone]

## 2023-08-28 NOTE — Progress Notes (Signed)
 Care Guide Pharmacy Note  08/28/2023 Name: SHAENA PARKERSON MRN: [id number] DOB: 10-Dec-1945  Referred By: Solomon Dupre, DO Reason for referral: Complex Care Management (Initial Outreach with [name])   CHRISTIAN TREADWAY is a 78 y.o. year old female who is a primary care patient of Solomon Dupre, DO.  YANCI BACHTELL was referred to the pharmacist for assistance related to: DMII  A third unsuccessful telephone outreach was attempted today to contact the patient who was referred to the pharmacy team for assistance with medication management. The Population Health team is pleased to engage with this patient at any time in the future upon receipt of referral and should he/she be interested in assistance from the [employer] [facility] team.  Lenton Rail , RMA     [facility]  [facility], [facility] Guide  Direct Dial: [phone]  Website: [email or web address]

## 2023-08-31 ENCOUNTER — Encounter: Payer: Self-pay | Admitting: Family Medicine

## 2023-08-31 ENCOUNTER — Ambulatory Visit: Payer: Self-pay | Admitting: Family Medicine

## 2023-08-31 NOTE — Telephone Encounter (Signed)
Called and relayed message from provider.

## 2023-08-31 NOTE — Telephone Encounter (Signed)
 Letter is on her mychart- if she needs a signed copy she can come in and we'll get one for her.

## 2023-09-11 ENCOUNTER — Ambulatory Visit: Payer: Self-pay

## 2023-09-11 ENCOUNTER — Encounter: Payer: Self-pay | Admitting: Family Medicine

## 2023-09-11 ENCOUNTER — Ambulatory Visit
Admission: RE | Admit: 2023-09-11 | Discharge: 2023-09-11 | Disposition: A | Discharge status: Home / Self Care | Source: Ambulatory Visit | Attending: Family Medicine | Admitting: Family Medicine

## 2023-09-11 ENCOUNTER — Ambulatory Visit: Admitting: Family Medicine

## 2023-09-11 ENCOUNTER — Ambulatory Visit: Payer: Self-pay | Admitting: Family Medicine

## 2023-09-11 VITALS — BP 117/68 | HR 81 | Temp 98.2°F

## 2023-09-11 DIAGNOSIS — N182 Chronic kidney disease, stage 2 (mild): Secondary | ICD-10-CM | POA: Diagnosis present

## 2023-09-11 DIAGNOSIS — I951 Orthostatic hypotension: Secondary | ICD-10-CM

## 2023-09-11 DIAGNOSIS — I129 Hypertensive chronic kidney disease with stage 1 through stage 4 chronic kidney disease, or unspecified chronic kidney disease: Secondary | ICD-10-CM | POA: Diagnosis not present

## 2023-09-11 DIAGNOSIS — E1122 Type 2 diabetes mellitus with diabetic chronic kidney disease: Secondary | ICD-10-CM | POA: Diagnosis not present

## 2023-09-11 DIAGNOSIS — R42 Dizziness and giddiness: Secondary | ICD-10-CM

## 2023-09-11 NOTE — Assessment & Plan Note (Signed)
 Has been out of control. Risk factor for stroke. Will check CT head to r/o stroke.

## 2023-09-11 NOTE — Assessment & Plan Note (Signed)
 Will check CT head to r/o stroke given risk factors. BP is significantly lower over shorter period of time as she's gotten back on medication, could be medication side effect. + nystagmus, could be BPPV- will start epley's manuvers. Checking labs. Await results. Recheck 2 weeks.

## 2023-09-11 NOTE — Assessment & Plan Note (Signed)
 Has been out of control, now running low-normal. Risk factor for stroke. Will check CT head to r/o stroke.

## 2023-09-11 NOTE — Telephone Encounter (Signed)
  FYI Only or Action Required?: FYI only for provider.  Patient was last seen in primary care on 08/20/2023 by Solomon Dupre, DO. Called Nurse Triage reporting Dizziness. Symptoms began several days ago. Interventions attempted: Rest, hydration, or home remedies. Symptoms are: gradually worsening.  Triage Disposition: See PCP When Office is Open (Within 3 Days)  Patient/caregiver understands and will follow disposition?: Yes      Copied from CRM [id number]. Topic: Clinical - Red Word Triage >> Sep 11, 2023  9:04 AM Leory Rands wrote: Red Word that prompted transfer to Nurse Triage: Patient daughter is calling to report that the patient is dizzy with vomiting. Starting Monday 09/07/2023 Reason for Disposition  [1] MODERATE dizziness (e.g., interferes with normal activities) AND [2] has been evaluated by doctor (or NP/PA) for this  Answer Assessment - Initial Assessment Questions 1. DESCRIPTION: Describe your dizziness.     Gets dizzy when standing  2. LIGHTHEADED: Do you feel lightheaded? (e.g., somewhat faint, woozy, weak upon standing)     Starts feeling light headed when standing for too lunch  3. VERTIGO: Do you feel like either you or the room is spinning or tilting? (i.e. vertigo)     Yes  4. SEVERITY: How bad is it?  Do you feel like you are going to faint? Can you stand and walk?   - MILD: Feels slightly dizzy, but walking normally.   - MODERATE: Feels unsteady when walking, but not falling; interferes with normal activities (e.g., school, work).   - SEVERE: Unable to walk without falling, or requires assistance to walk without falling; feels like passing out now.      Mild to moderate  5. ONSET:  When did the dizziness begin?     Started 4 days  6. AGGRAVATING FACTORS: Does anything make it worse? (e.g., standing, change in head position)     Changing position, feels fine when laying down  7. HEART RATE: Can you tell me your heart rate? How many beats  in 15 seconds?  (Note: not all patients can do this)       No  8. CAUSE: What do you think is causing the dizziness?     *No Answer* 9. RECURRENT SYMPTOM: Have you had dizziness before? If Yes, ask: When was the last time? What happened that time?     Yes, passed out a few times in the past. Had a MRI but stopped having them, diagnosed with positional vertigo  10. OTHER SYMPTOMS: Do you have any other symptoms? (e.g., fever, chest pain, vomiting, diarrhea, bleeding)       Vomiting  11. PREGNANCY: Is there any chance you are pregnant? When was your last menstrual period?       *No Answer*  Protocols used: Dizziness - Lightheadedness-A-AH

## 2023-09-11 NOTE — Progress Notes (Addendum)
 BP 117/68 (BP Location: Left Arm, Patient Position: Sitting, Cuff Size: Normal)   Pulse 81   Temp 98.2 F (36.8 C) (Oral)   SpO2 95%    Subjective:    Patient ID: Alison Hernandez, female    DOB: [birth year], 78 y.o.   MRN: [id number]  HPI: Alison Hernandez is a 78 y.o. female  Chief Complaint  Patient presents with   Dizziness    Onset Sunday. Has been occurring everyday and most of the day. Worsens when having to get up and move around. Sitting and laying down are okay and does not cause dizziness.    DIZZINESS Duration: 5-6 days Description of symptoms: lightheaded Duration of episode: 20-30 minutes Dizziness frequency: recurrent Provoking factors: sitting up Triggered by rolling over in bed: no Triggered by bending over: yes Aggravated by head movement: no Aggravated by exertion, coughing, loud noises: no Recent head injury: no Recent or current viral symptoms: no History of vasovagal episodes: no Nausea: yes Vomiting: yes- 1x daily for the past 5 days in the mid morning Tinnitus: no Hearing loss: no Aural fullness: no Headache: no Photophobia/phonophobia: no Unsteady gait: yes Postural instability: no Diplopia, dysarthria, dysphagia or weakness: no Related to exertion: no Pallor: no Diaphoresis: no Dyspnea: no Chest pain: no  Relevant past medical, surgical, family and social history reviewed and updated as indicated. Interim medical history since our last visit reviewed. Allergies and medications reviewed and updated.  Review of Systems  Constitutional: Negative.   Respiratory: Negative.    Cardiovascular: Negative.   Musculoskeletal: Negative.   Neurological:  Positive for dizziness. Negative for tremors, seizures, syncope, facial asymmetry, speech difficulty, weakness, light-headedness, numbness and headaches.  Psychiatric/Behavioral: Negative.      Per HPI unless specifically indicated above     Objective:    BP 117/68 (BP Location: Left Arm,  Patient Position: Sitting, Cuff Size: Normal)   Pulse 81   Temp 98.2 F (36.8 C) (Oral)   SpO2 95%   Wt Readings from Last 3 Encounters:  08/20/23 159 lb 6.4 oz (72.3 kg)  03/31/23 149 lb 9.6 oz (67.9 kg)  12/29/22 148 lb 3.2 oz (67.2 kg)    Physical Exam Vitals and nursing note reviewed.  Constitutional:      General: She is not in acute distress.    Appearance: Normal appearance. She is not ill-appearing, toxic-appearing or diaphoretic.  HENT:     Head: Normocephalic and atraumatic.     Right Ear: External ear normal.     Left Ear: External ear normal.     Nose: Nose normal.     Mouth/Throat:     Mouth: Mucous membranes are moist.     Pharynx: Oropharynx is clear.   Eyes:     General: No scleral icterus.       Right eye: No discharge.        Left eye: No discharge.     Extraocular Movements: Extraocular movements intact.     Right eye: Nystagmus present.     Left eye: Nystagmus present.     Conjunctiva/sclera: Conjunctivae normal.     Pupils: Pupils are equal, round, and reactive to light.    Cardiovascular:     Rate and Rhythm: Normal rate and regular rhythm.     Pulses: Normal pulses.     Heart sounds: Normal heart sounds. No murmur heard.    No friction rub. No gallop.  Pulmonary:     Effort: Pulmonary effort is normal. No respiratory  distress.     Breath sounds: Normal breath sounds. No stridor. No wheezing, rhonchi or rales.  Chest:     Chest wall: No tenderness.   Musculoskeletal:        General: Normal range of motion.     Cervical back: Normal range of motion and neck supple.   Skin:    General: Skin is warm and dry.     Capillary Refill: Capillary refill takes less than 2 seconds.     Coloration: Skin is not jaundiced or pale.     Findings: No bruising, erythema, lesion or rash.   Neurological:     General: No focal deficit present.     Mental Status: She is alert and oriented to person, place, and time. Mental status is at baseline.     Cranial  Nerves: No cranial nerve deficit.     Sensory: No sensory deficit.     Motor: Pronator drift present. No weakness.     Coordination: Romberg sign positive. Coordination abnormal.     Gait: Gait normal.     Deep Tendon Reflexes: Reflexes normal.   Psychiatric:        Mood and Affect: Mood normal.        Behavior: Behavior normal.        Thought Content: Thought content normal.        Judgment: Judgment normal.     Results for orders placed or performed in visit on 08/20/23  Bayer DCA Hb A1c Waived   Collection Time: 08/20/23  1:30 PM  Result Value Ref Range   HB A1C (BAYER DCA - WAIVED) 8.5 (H) 4.8 - 5.6 %  Comprehensive metabolic panel with GFR   Collection Time: 08/20/23  1:30 PM  Result Value Ref Range   Glucose 138 (H) 70 - 99 mg/dL   BUN 14 8 - 27 mg/dL   Creatinine, Ser 1.61 0.57 - 1.00 mg/dL   eGFR 63 >09 UE/AVW/0.98   BUN/Creatinine Ratio 15 12 - 28   Sodium 137 134 - 144 mmol/L   Potassium 4.4 3.5 - 5.2 mmol/L   Chloride 101 96 - 106 mmol/L   CO2 21 20 - 29 mmol/L   Calcium  9.3 8.7 - 10.3 mg/dL   Total Protein 6.5 6.0 - 8.5 g/dL   Albumin 4.3 3.8 - 4.8 g/dL   Globulin, Total 2.2 1.5 - 4.5 g/dL   Bilirubin Total 0.4 0.0 - 1.2 mg/dL   Alkaline Phosphatase 92 44 - 121 IU/L   AST 14 0 - 40 IU/L   ALT 10 0 - 32 IU/L  Lipid Panel w/o Chol/HDL Ratio   Collection Time: 08/20/23  1:30 PM  Result Value Ref Range   Cholesterol, Total 212 (H) 100 - 199 mg/dL   Triglycerides 119 0 - 149 mg/dL   HDL 70 >14 mg/dL   VLDL Cholesterol Cal 23 5 - 40 mg/dL   LDL Chol Calc (NIH) 782 (H) 0 - 99 mg/dL      Assessment & Plan:   Problem List Items Addressed This Visit       Endocrine   Diabetes (HCC)   Has been out of control. Risk factor for stroke. Will check CT head to r/o stroke.       Relevant Orders   CT HEAD WO CONTRAST ( )     Genitourinary   Benign hypertensive renal disease   Has been out of control, now running low-normal. Risk factor for stroke. Will  check CT head to r/o  stroke.       Relevant Orders   CT HEAD WO CONTRAST ( )     Other   Dizziness - Primary   Will check CT head to r/o stroke given risk factors. BP is significantly lower over shorter period of time as she's gotten back on medication, could be medication side effect. + nystagmus, could be BPPV- will start epley's manuvers. Checking labs. Await results. Recheck 2 weeks.       Relevant Orders   CT HEAD WO CONTRAST ( )   CBC with Differential/Platelet   Comprehensive metabolic panel with GFR   TSH   Other Visit Diagnoses       Orthostatic hypotension       Will stop HCTZ and recheck in 2 weeks.        Follow up plan: Return in about 2 weeks (around 09/25/2023) for ok to use same day.

## 2023-09-11 NOTE — Addendum Note (Signed)
 Addended by: Solomon Dupre on: 09/11/2023 04:57 PM   Modules accepted: Orders

## 2023-09-12 LAB — CBC WITH DIFFERENTIAL/PLATELET
Basophils Absolute: 0 10*3/uL (ref 0.0–0.2)
Basos: 1 %
EOS (ABSOLUTE): 0.1 10*3/uL (ref 0.0–0.4)
Eos: 1 %
Hematocrit: 30.1 % — ABNORMAL LOW (ref 34.0–46.6)
Hemoglobin: 9.7 g/dL — ABNORMAL LOW (ref 11.1–15.9)
Immature Grans (Abs): 0 10*3/uL (ref 0.0–0.1)
Immature Granulocytes: 0 %
Lymphocytes Absolute: 2.2 10*3/uL (ref 0.7–3.1)
Lymphs: 36 %
MCH: 29.1 pg (ref 26.6–33.0)
MCHC: 32.2 g/dL (ref 31.5–35.7)
MCV: 90 fL (ref 79–97)
Monocytes Absolute: 0.4 10*3/uL (ref 0.1–0.9)
Monocytes: 6 %
Neutrophils Absolute: 3.5 10*3/uL (ref 1.4–7.0)
Neutrophils: 56 %
Platelets: 215 10*3/uL (ref 150–450)
RBC: 3.33 x10E6/uL — ABNORMAL LOW (ref 3.77–5.28)
RDW: 13.7 % (ref 11.7–15.4)
WBC: 6.1 10*3/uL (ref 3.4–10.8)

## 2023-09-12 LAB — COMPREHENSIVE METABOLIC PANEL WITH GFR
ALT: 12 IU/L (ref 0–32)
AST: 16 IU/L (ref 0–40)
Albumin: 4.3 g/dL (ref 3.8–4.8)
Alkaline Phosphatase: 79 IU/L (ref 44–121)
BUN/Creatinine Ratio: 27 (ref 12–28)
BUN: 31 mg/dL — ABNORMAL HIGH (ref 8–27)
Bilirubin Total: 0.3 mg/dL (ref 0.0–1.2)
CO2: 18 mmol/L — ABNORMAL LOW (ref 20–29)
Calcium: 8.8 mg/dL (ref 8.7–10.3)
Chloride: 101 mmol/L (ref 96–106)
Creatinine, Ser: 1.16 mg/dL — ABNORMAL HIGH (ref 0.57–1.00)
Globulin, Total: 2 g/dL (ref 1.5–4.5)
Glucose: 136 mg/dL — ABNORMAL HIGH (ref 70–99)
Potassium: 4.6 mmol/L (ref 3.5–5.2)
Sodium: 134 mmol/L (ref 134–144)
Total Protein: 6.3 g/dL (ref 6.0–8.5)
eGFR: 49 mL/min/{1.73_m2} — ABNORMAL LOW (ref >59)

## 2023-09-12 LAB — TSH: TSH: 2.5 u[IU]/mL (ref 0.450–4.500)

## 2023-09-14 ENCOUNTER — Other Ambulatory Visit: Payer: Self-pay | Admitting: Family Medicine

## 2023-09-14 MED ORDER — IRON (FERROUS SULFATE) 325 (65 FE) MG PO TABS: 325.0000 mg | ORAL_TABLET | Freq: Every day | ORAL | 2 refills | Status: DC

## 2023-09-15 NOTE — Telephone Encounter (Signed)
 Requested Prescriptions  Pending Prescriptions Disp Refills   Dulaglutide  (TRULICITY ) 0.75 MG/0.5ML SOAJ [Pharmacy Med Name: Trulicity  0.75 MG/0.5ML Subcutaneous Solution Pen-injector] 6 mL 0    Sig: INJECT 0.75 MG SUBCUTANEOUSLY ONCE A WEEK     Endocrinology:  Diabetes - GLP-1 Receptor Agonists Failed - 09/15/2023  3:26 PM      Failed - HBA1C is between 0 and 7.9 and within 180 days    Hemoglobin A1C  Date Value Ref Range Status  11/28/2015 7.5  Final   HB A1C (BAYER DCA - WAIVED)  Date Value Ref Range Status  08/20/2023 8.5 (H) 4.8 - 5.6 % Final    Comment:             Prediabetes: 5.7 - 6.4          Diabetes: >6.4          Glycemic control for adults with diabetes: <7.0          Failed - Valid encounter within last 6 months    Recent Outpatient Visits           4 days ago Dizziness   [place] [facility] [place], Megan P, DO   3 weeks ago Type 2 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 disease, without long-term current use of insulin (HCC)   [place] [facility] [place], [place], DO

## 2023-10-06 ENCOUNTER — Ambulatory Visit (INDEPENDENT_AMBULATORY_CARE_PROVIDER_SITE_OTHER): Admitting: Family Medicine

## 2023-10-06 ENCOUNTER — Emergency Department

## 2023-10-06 ENCOUNTER — Other Ambulatory Visit: Payer: Self-pay

## 2023-10-06 ENCOUNTER — Emergency Department
Admission: EM | Admit: 2023-10-06 | Discharge: 2023-10-06 | Disposition: A | Discharge status: Home / Self Care | Attending: Emergency Medicine | Admitting: Emergency Medicine

## 2023-10-06 ENCOUNTER — Encounter: Payer: Self-pay | Admitting: Family Medicine

## 2023-10-06 VITALS — BP 138/66 | HR 72 | Temp 98.2°F | Resp 15 | Ht 65.98 in | Wt 152.6 lb

## 2023-10-06 DIAGNOSIS — R519 Headache, unspecified: Secondary | ICD-10-CM | POA: Diagnosis not present

## 2023-10-06 DIAGNOSIS — R42 Dizziness and giddiness: Secondary | ICD-10-CM

## 2023-10-06 DIAGNOSIS — S12101A Unspecified nondisplaced fracture of second cervical vertebra, initial encounter for closed fracture: Secondary | ICD-10-CM | POA: Diagnosis not present

## 2023-10-06 DIAGNOSIS — R1010 Upper abdominal pain, unspecified: Secondary | ICD-10-CM | POA: Diagnosis not present

## 2023-10-06 DIAGNOSIS — M542 Cervicalgia: Secondary | ICD-10-CM | POA: Diagnosis present

## 2023-10-06 DIAGNOSIS — I10 Essential (primary) hypertension: Secondary | ICD-10-CM | POA: Diagnosis not present

## 2023-10-06 DIAGNOSIS — M545 Low back pain, unspecified: Secondary | ICD-10-CM | POA: Insufficient documentation

## 2023-10-06 DIAGNOSIS — E119 Type 2 diabetes mellitus without complications: Secondary | ICD-10-CM | POA: Diagnosis not present

## 2023-10-06 DIAGNOSIS — N182 Chronic kidney disease, stage 2 (mild): Secondary | ICD-10-CM

## 2023-10-06 DIAGNOSIS — I129 Hypertensive chronic kidney disease with stage 1 through stage 4 chronic kidney disease, or unspecified chronic kidney disease: Secondary | ICD-10-CM

## 2023-10-06 DIAGNOSIS — Y92410 Unspecified street and highway as the place of occurrence of the external cause: Secondary | ICD-10-CM | POA: Insufficient documentation

## 2023-10-06 DIAGNOSIS — S2232XA Fracture of one rib, left side, initial encounter for closed fracture: Secondary | ICD-10-CM | POA: Insufficient documentation

## 2023-10-06 DIAGNOSIS — E1122 Type 2 diabetes mellitus with diabetic chronic kidney disease: Secondary | ICD-10-CM

## 2023-10-06 DIAGNOSIS — M546 Pain in thoracic spine: Secondary | ICD-10-CM | POA: Diagnosis not present

## 2023-10-06 LAB — TROPONIN I (HIGH SENSITIVITY)
Troponin I (High Sensitivity): 8 ng/L (ref <18)
Troponin I (High Sensitivity): 9 ng/L (ref <18)

## 2023-10-06 LAB — CBC
HCT: 30 % — ABNORMAL LOW (ref 36.0–46.0)
Hemoglobin: 10.3 g/dL — ABNORMAL LOW (ref 12.0–15.0)
MCH: 29.7 pg (ref 26.0–34.0)
MCHC: 34.3 g/dL (ref 30.0–36.0)
MCV: 86.5 fL (ref 80.0–100.0)
Platelets: 213 K/uL (ref 150–400)
RBC: 3.47 MIL/uL — ABNORMAL LOW (ref 3.87–5.11)
RDW: 13.6 % (ref 11.5–15.5)
WBC: 6.6 K/uL (ref 4.0–10.5)
nRBC: 0 % (ref 0.0–0.2)

## 2023-10-06 LAB — BASIC METABOLIC PANEL WITH GFR
Anion gap: 13 (ref 5–15)
BUN: 23 mg/dL (ref 8–23)
CO2: 21 mmol/L — ABNORMAL LOW (ref 22–32)
Calcium: 9.4 mg/dL (ref 8.9–10.3)
Chloride: 102 mmol/L (ref 98–111)
Creatinine, Ser: 0.65 mg/dL (ref 0.44–1.00)
GFR, Estimated: >60 mL/min (ref >60)
Glucose, Bld: 124 mg/dL — ABNORMAL HIGH (ref 70–99)
Potassium: 4.3 mmol/L (ref 3.5–5.1)
Sodium: 136 mmol/L (ref 135–145)

## 2023-10-06 MED ORDER — OXYCODONE-ACETAMINOPHEN 5-325 MG PO TABS
1.0000 | ORAL_TABLET | Freq: Once | ORAL | Status: AC
  Administered 2023-10-06: 1 via ORAL
  Filled 2023-10-06: qty 1

## 2023-10-06 MED ORDER — IOHEXOL 300 MG/ML  SOLN
100.0000 mL | Freq: Once | INTRAMUSCULAR | Status: AC | PRN
  Administered 2023-10-06: 100 mL via INTRAVENOUS

## 2023-10-06 MED ORDER — IOHEXOL 350 MG/ML SOLN
75.0000 mL | Freq: Once | INTRAVENOUS | Status: AC | PRN
  Administered 2023-10-06: 75 mL via INTRAVENOUS

## 2023-10-06 MED ORDER — OXYCODONE-ACETAMINOPHEN 5-325 MG PO TABS: 1.0000 | ORAL_TABLET | ORAL | 0 refills | Status: DC | PRN

## 2023-10-06 MED ORDER — GLUCOSE BLOOD VI STRP: ORAL_STRIP | 12 refills | Status: DC

## 2023-10-06 NOTE — Progress Notes (Signed)
 BP 138/66   Pulse 72   Temp 98.2 F (36.8 C) (Oral)   Resp 15   Ht 5' 5.98 (1.676 m)   Wt 152 lb 9.6 oz (69.2 kg)   SpO2 97%   BMI 24.64 kg/m    Subjective:    Patient ID: Alison Hernandez, female    DOB: 1946/02/22, 78 y.o.   MRN: [id number]  HPI: Alison Hernandez is a 78 y.o. female  Chief Complaint  Patient presents with   Dizziness    Has since resolved    Dizziness is entirely resolved. Feeling like herself.   HYPERTENSION  Hypertension status: controlled  Satisfied with current treatment? yes Duration of hypertension: chronic BP monitoring frequency:  rarely BP medication side effects:  no Medication compliance: excellent compliance Previous BP meds:lisinopril  Aspirin: yes Recurrent headaches: no Visual changes: no Palpitations: no Dyspnea: no Chest pain: no Lower extremity edema: no Dizzy/lightheaded: no  Relevant past medical, surgical, family and social history reviewed and updated as indicated. Interim medical history since our last visit reviewed. Allergies and medications reviewed and updated.  Review of Systems  Constitutional: Negative.   Respiratory: Negative.    Cardiovascular: Negative.   Musculoskeletal: Negative.   Neurological: Negative.   Psychiatric/Behavioral: Negative.      Per HPI unless specifically indicated above     Objective:    BP 138/66   Pulse 72   Temp 98.2 F (36.8 C) (Oral)   Resp 15   Ht 5' 5.98 (1.676 m)   Wt 152 lb 9.6 oz (69.2 kg)   SpO2 97%   BMI 24.64 kg/m   Wt Readings from Last 3 Encounters:  10/06/23 152 lb 9.6 oz (69.2 kg)  08/20/23 159 lb 6.4 oz (72.3 kg)  03/31/23 149 lb 9.6 oz (67.9 kg)    Physical Exam Vitals and nursing note reviewed.  Constitutional:      General: She is not in acute distress.    Appearance: Normal appearance. She is not ill-appearing, toxic-appearing or diaphoretic.  HENT:     Head: Normocephalic and atraumatic.     Right Ear: External ear normal.     Left Ear:  External ear normal.     Nose: Nose normal.     Mouth/Throat:     Mouth: Mucous membranes are moist.     Pharynx: Oropharynx is clear.  Eyes:     General: No scleral icterus.       Right eye: No discharge.        Left eye: No discharge.     Extraocular Movements: Extraocular movements intact.     Conjunctiva/sclera: Conjunctivae normal.     Pupils: Pupils are equal, round, and reactive to light.  Cardiovascular:     Rate and Rhythm: Normal rate and regular rhythm.     Pulses: Normal pulses.     Heart sounds: Normal heart sounds. No murmur heard.    No friction rub. No gallop.  Pulmonary:     Effort: Pulmonary effort is normal. No respiratory distress.     Breath sounds: Normal breath sounds. No stridor. No wheezing, rhonchi or rales.  Chest:     Chest wall: No tenderness.  Musculoskeletal:        General: Normal range of motion.     Cervical back: Normal range of motion and neck supple.  Skin:    General: Skin is warm and dry.     Capillary Refill: Capillary refill takes less than 2 seconds.  Coloration: Skin is not jaundiced or pale.     Findings: No bruising, erythema, lesion or rash.  Neurological:     General: No focal deficit present.     Mental Status: She is alert and oriented to person, place, and time. Mental status is at baseline.  Psychiatric:        Mood and Affect: Mood normal.        Behavior: Behavior normal.        Thought Content: Thought content normal.        Judgment: Judgment normal.     Results for orders placed or performed in visit on 09/11/23  CBC with Differential/Platelet   Collection Time: 09/11/23  4:29 PM  Result Value Ref Range   WBC 6.1 3.4 - 10.8 x10E3/uL   RBC 3.33 (L) 3.77 - 5.28 x10E6/uL   Hemoglobin 9.7 (L) 11.1 - 15.9 g/dL   Hematocrit 69.8 (L) 65.9 - 46.6 %   MCV 90 79 - 97 fL   MCH 29.1 26.6 - 33.0 pg   MCHC 32.2 31.5 - 35.7 g/dL   RDW 86.2 88.2 - 84.5 %   Platelets 215 150 - 450 x10E3/uL   Neutrophils 56 Not Estab. %    Lymphs 36 Not Estab. %   Monocytes 6 Not Estab. %   Eos 1 Not Estab. %   Basos 1 Not Estab. %   Neutrophils Absolute 3.5 1.4 - 7.0 x10E3/uL   Lymphocytes Absolute 2.2 0.7 - 3.1 x10E3/uL   Monocytes Absolute 0.4 0.1 - 0.9 x10E3/uL   EOS (ABSOLUTE) 0.1 0.0 - 0.4 x10E3/uL   Basophils Absolute 0.0 0.0 - 0.2 x10E3/uL   Immature Granulocytes 0 Not Estab. %   Immature Grans (Abs) 0.0 0.0 - 0.1 x10E3/uL  Comprehensive metabolic panel with GFR   Collection Time: 09/11/23  4:29 PM  Result Value Ref Range   Glucose 136 (H) 70 - 99 mg/dL   BUN 31 (H) 8 - 27 mg/dL   Creatinine, Ser 8.83 (H) 0.57 - 1.00 mg/dL   eGFR 49 (L) >40 fO/fpw/8.26   BUN/Creatinine Ratio 27 12 - 28   Sodium 134 134 - 144 mmol/L   Potassium 4.6 3.5 - 5.2 mmol/L   Chloride 101 96 - 106 mmol/L   CO2 18 (L) 20 - 29 mmol/L   Calcium  8.8 8.7 - 10.3 mg/dL   Total Protein 6.3 6.0 - 8.5 g/dL   Albumin 4.3 3.8 - 4.8 g/dL   Globulin, Total 2.0 1.5 - 4.5 g/dL   Bilirubin Total 0.3 0.0 - 1.2 mg/dL   Alkaline Phosphatase 79 44 - 121 IU/L   AST 16 0 - 40 IU/L   ALT 12 0 - 32 IU/L  TSH   Collection Time: 09/11/23  4:29 PM  Result Value Ref Range   TSH 2.500 0.450 - 4.500 uIU/mL      Assessment & Plan:   Problem List Items Addressed This Visit       Endocrine   Diabetes (HCC) - Primary   Has been off her trulicity  for 2 weeks. Referral back to pharmacy for help with medication. Due for A1c in September. Call with any concerns.       Relevant Orders   AMB Referral VBCI Care Management     Genitourinary   Benign hypertensive renal disease   Under good control on current regimen. Continue current regimen. Continue to monitor. Call with any concerns.          Other   Dizziness  Entirely resolved. Remain off hydrochlorothiazide . Continue to monitor.         Follow up plan: Return for As scheduled.

## 2023-10-06 NOTE — Assessment & Plan Note (Signed)
Under good control on current regimen. Continue current regimen. Continue to monitor. Call with any concerns.   

## 2023-10-06 NOTE — Assessment & Plan Note (Signed)
 Has been off her trulicity  for 2 weeks. Referral back to pharmacy for help with medication. Due for A1c in September. Call with any concerns.

## 2023-10-06 NOTE — Discharge Instructions (Addendum)
 Dr. Elliot office should contact you for follow-up.  Keep the cervical collar on for comfort and support although you may take it off to bathe.  Return to the ER immediately for new, worsening, or persistent severe pain, weakness or numbness in your arms or legs, difficulty breathing, or any other new or worsening symptoms that concern you.
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
## 2023-10-06 NOTE — Assessment & Plan Note (Signed)
 Entirely resolved. Remain off hydrochlorothiazide . Continue to monitor.
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
## 2023-10-07 ENCOUNTER — Telehealth: Payer: Self-pay

## 2023-10-07 NOTE — Progress Notes (Signed)
 Care Guide Pharmacy Note  10/07/2023 Name: Alison Hernandez MRN: [id number] DOB: 1946-03-07  Referred By: Vicci Duwaine SQUIBB, DO Reason for referral: Complex Care Management (Outreach to schedule with Pharm d )   Alison Hernandez is a 78 y.o. year old female who is a primary care patient of Vicci Duwaine SQUIBB, DO.  Alison Hernandez was referred to the pharmacist for assistance related to: DMII  An unsuccessful telephone outreach was attempted today to contact the patient who was referred to the pharmacy team for assistance with medication assistance. Additional attempts will be made to contact the patient.  Jeoffrey Buffalo , RMA     [facility]  [facility], [facility] Guide  Direct Dial: [phone]  Website: [email or web address]

## 2023-10-07 NOTE — Progress Notes (Signed)
 Care Guide Pharmacy Note  10/07/2023 Name: Alison Hernandez MRN: [id number] DOB: [birth year]  Referred By: Vicci Duwaine SQUIBB, DO Reason for referral: Complex Care Management (Outreach to schedule with Pharm d )   Alison Hernandez is a 78 y.o. year old female who is a primary care patient of Vicci Duwaine SQUIBB, DO.  Alison Hernandez was referred to the pharmacist for assistance related to: DMII  Successful contact was made with the patient to discuss pharmacy services including being ready for the pharmacist to call at least 5 minutes before the scheduled appointment time and to have medication bottles and any blood pressure readings ready for review. The patient agreed to meet with the pharmacist via telephone visit on (date/time).10/14/2023  Jeoffrey Buffalo , RMA     [place]  [facility], [facility] Guide  Direct Dial: [phone]  Website: [email or web address]

## 2023-10-12 ENCOUNTER — Encounter: Payer: Self-pay | Admitting: Family Medicine

## 2023-10-12 ENCOUNTER — Other Ambulatory Visit: Payer: Self-pay | Admitting: Family Medicine

## 2023-10-12 DIAGNOSIS — R911 Solitary pulmonary nodule: Secondary | ICD-10-CM | POA: Insufficient documentation

## 2023-10-12 DIAGNOSIS — E041 Nontoxic single thyroid nodule: Secondary | ICD-10-CM

## 2023-10-12 NOTE — ED Provider Notes (Signed)
-----------------------------------------   10:20 AM on 10/12/2023 -----------------------------------------  I contacted the patient's PMD Dr. Vicci via secure chat to inform her of the incidental CT findings of the thyroid  nodule and lung nodule and need for follow-up.  Dr. Vicci acknowledged these results and stated she would arrange for appropriate follow-up.   Jacolyn Pae, MD 10/12/23 1020

## 2023-10-14 ENCOUNTER — Other Ambulatory Visit: Payer: Self-pay

## 2023-10-14 NOTE — Progress Notes (Signed)
 10/14/2023 Name: Alison Hernandez MRN: [id number] DOB: December 25, 1945  Chief Complaint  Patient presents with   Diabetes Management Plan   Alison Hernandez is a 78 y.o. year old female who presented for a telephone visit.   They were referred to the pharmacist by their PCP for assistance in managing diabetes.   Subjective:  Care Team: Primary Care Provider: Vicci Duwaine SQUIBB, DO ; Next Scheduled Visit: 9/3  Medication Access/Adherence  Current Pharmacy:  [facility] [address] ([address]),  - [address] [place]) [place] [address] Phone: [phone] Fax: [phone]  [facility] - [place], [place] - [address] [place] [address] Phone: [phone] Fax: [phone]  [facility] [place]  - [place], [place] - [address] [place] [address] Phone: [phone] Fax: [phone]  -Patient reports affordability concerns with their medications: Yes  -Patient reports access/transportation concerns to their pharmacy: No  -Patient reports adherence concerns with their medications:  Yes    Diabetes: Current medications: metformin  1000mg  BID,  -Patient previously received Trulicity  through [employer] PAP, but enrollment ended 03/24/2023.  She did not complete re- enrollment and has now ran out of medication and cannot afford copay -A1c 5/29 elevated at 8.5% from 6.9% previously  Objective: Lab Results  Component Value Date   HGBA1C 8.5 (H) 08/20/2023   Lab Results  Component Value Date   CREATININE 0.65 10/06/2023   BUN 23 10/06/2023   NA 136 10/06/2023   K 4.3 10/06/2023   CL 102 10/06/2023   CO2 21 (L) 10/06/2023   Medications Reviewed Today     Reviewed by Deanna Channing LABOR, RPH (Pharmacist) on 10/14/23 at 1355  Med List Status: <None>   Medication Order Taking? Sig Documenting Provider Last Dose Status Informant   alendronate  (FOSAMAX ) 70 MG tablet [id number]  Take 1 tablet (70 mg total) by mouth every 7 (seven) days. Take with a full glass of water on an empty stomach. Vicci Duwaine P, DO  Active   aspirin EC 81 MG tablet [id number]  Take 81 mg by mouth daily. Swallow whole. [provider]  Active   Blood Glucose Monitoring Suppl (ONE TOUCH ULTRA 2) w/Device KIT [id number]  TEST THREE TIMES A DAY Johnson, Megan P, DO  Active   buPROPion  (WELLBUTRIN  XL) 150 MG 24 hr tablet [id number]  Take 1 tablet (150 mg total) by mouth every morning. Johnson, Megan P, DO  Active   diclofenac  Sodium (VOLTAREN ) 1 % GEL [id number]  APPLY 4 GRAMS TOPICALLY FOUR TIMES A DAY AS DIRECTED Johnson, Megan P, DO  Active   Dulaglutide  (TRULICITY ) 0.75 MG/0.5ML EMMANUEL [id number]  INJECT 0.75 MG SUBCUTANEOUSLY ONCE A WEEK  Patient not taking: Reported on 10/14/2023   Vicci Duwaine P, DO  Active   Dulaglutide  (TRULICITY ) 1.5 MG/0.5ML EMMANUEL [id number]  Inject 1.5 mg into the skin once a week.  Patient not taking: Reported on 10/14/2023   Vicci Duwaine SQUIBB, DO  Active   Dulaglutide  (TRULICITY ) 3 MG/0.5ML EMMANUEL [id number]  Inject 3 mg as directed once a week.  Patient not taking: Reported on 10/14/2023   Vicci Duwaine SQUIBB, DO  Active   Dulaglutide  (TRULICITY ) 4.5 MG/0.5ML EMMANUEL [id number]  Inject 4.5 mg as directed once a week.  Patient not taking: Reported on 10/14/2023   Vicci Duwaine SQUIBB, DO  Active   glucose blood [facility] ULTRA) test strip [id number]  CHECK  GLUCOSE ONCE DAILY Vicci Bouchard P, DO  Active   glucose blood test strip [id number]  Use as instructed Vicci Bouchard SQUIBB, DO  Active   glucose blood test strip [id number]  Use as instructed Vicci, Megan P, DO  Active   Iron , Ferrous Sulfate , 325 (65 Fe) MG TABS [id number]  Take 325 mg by mouth daily. Vicci, Megan P, DO  Active   Lancets (ONETOUCH DELICA PLUS [place]) [place] [id number]  USE AS DIRECTED THREE TIMES A DAY Johnson, Megan P, DO  Active   lisinopril  (ZESTRIL ) 40 MG tablet  [id number]  Take 1 tablet (40 mg total) by mouth daily. Johnson, Megan P, DO  Active   meclizine  (ANTIVERT ) 25 MG tablet [id number]  Take 1 tablet (25 mg total) by mouth 3 (three) times daily as needed for dizziness. Vicci, Megan P, DO  Active   metFORMIN  (GLUCOPHAGE ) 1000 MG tablet [id number]  Take 1 tablet (1,000 mg total) by mouth 2 (two) times daily with a meal. Vicci, Megan P, DO  Active   Multiple Vitamin (MULTIVITAMIN) tablet [id number]  Take 1 tablet by mouth daily. [provider]  Active   nystatin  (NYAMYC ) powder [id number]  APPLY TOPICALLY FOUR TIMES A DAY AS DIRECTED Johnson, Megan P, DO  Active   omeprazole  (PRILOSEC) 20 MG capsule [id number]  Take 1 capsule (20 mg total) by mouth 2 (two) times daily before a meal. Vicci, Megan P, DO  Active   ondansetron  (ZOFRAN -ODT) 8 MG disintegrating tablet [id number]  PLACE 1 TABLET ON TONGUE AND ALLOW TO DISSOLVE EVERY 8 HOURS AS NEEDED FOR NAUSEA & VOMITING Johnson, Megan P, DO  Active   oxyCODONE -acetaminophen  (PERCOCET) 5-325 MG tablet [id number]  Take 1 tablet by mouth every 4 (four) hours as needed for severe pain (pain score 7-10). Jacolyn Pae, MD  Active   rosuvastatin  (CRESTOR ) 10 MG tablet [id number]  Take 1 tablet (10 mg total) by mouth daily. Vicci Bouchard P, DO  Active            Assessment/Plan:   Diabetes: -Currently uncontrolled -Coordinating with medication assistance team to see if patient would be eligible to re-enroll in [employer] PAP for Trulicity  since she technically is not a new patient to the program (new patients are not eligible to receive Trulicity  any longer).  If not, patient is open to changing to Ozempic to enroll in [employer] PAP if Dr. Vicci agrees.  Follow Up Plan: Will inform patient of [employer] eligibility and go from there  Channing DELENA Mealing, PharmD, DPLA

## 2023-10-19 ENCOUNTER — Telehealth: Payer: Self-pay

## 2023-10-19 NOTE — Telephone Encounter (Signed)
 Mail PAP Trulicity  to pt homes today and fax provider portion last Friday to follow up if not pt does not mail back with in a week

## 2023-10-23 ENCOUNTER — Other Ambulatory Visit: Payer: Self-pay | Admitting: Orthopedic Surgery

## 2023-10-23 DIAGNOSIS — S129XXA Fracture of neck, unspecified, initial encounter: Secondary | ICD-10-CM
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
## 2023-10-26 ENCOUNTER — Ambulatory Visit: Admission: RE | Admit: 2023-10-26 | Source: Ambulatory Visit

## 2023-10-29 ENCOUNTER — Ambulatory Visit
Admission: RE | Admit: 2023-10-29 | Discharge: 2023-10-29 | Disposition: A | Discharge status: Home / Self Care | Attending: Orthopedic Surgery | Admitting: Orthopedic Surgery

## 2023-10-29 ENCOUNTER — Encounter: Payer: Self-pay | Admitting: Orthopedic Surgery

## 2023-10-29 ENCOUNTER — Ambulatory Visit: Admitting: Orthopedic Surgery

## 2023-10-29 ENCOUNTER — Ambulatory Visit
Admission: RE | Admit: 2023-10-29 | Discharge: 2023-10-29 | Disposition: A | Discharge status: Home / Self Care | Source: Ambulatory Visit | Attending: Orthopedic Surgery | Admitting: Orthopedic Surgery

## 2023-10-29 VITALS — BP 146/80 | Ht 65.98 in | Wt 152.0 lb

## 2023-10-29 DIAGNOSIS — S129XXA Fracture of neck, unspecified, initial encounter: Secondary | ICD-10-CM

## 2023-10-29 DIAGNOSIS — M4802 Spinal stenosis, cervical region: Secondary | ICD-10-CM | POA: Diagnosis not present

## 2023-10-29 DIAGNOSIS — S12101A Unspecified nondisplaced fracture of second cervical vertebra, initial encounter for closed fracture: Secondary | ICD-10-CM | POA: Diagnosis not present

## 2023-10-29 NOTE — Patient Instructions (Signed)
 It was so nice to see you today. Thank you so much for coming in.    You have a broken bone in your neck at C2. This should heal without any surgery.   You can wear your collar for comfort. Can remove to sleep and eat.   I ordered xrays of your neck. You can get these at [facility] (building with the white pillars) off of [address]. The address is [address], [place], [place] [address]. You do not need any appointment. I will message you with results.   I will see you back in 4 weeks. No work until that time. Will revisit work at your appointment. Please do not hesitate to call if you have any questions or concerns. You can also message me in MyChart.   Glade Boys PA-C [phone]     The physicians and staff at [facility] Neurosurgery at [facility] are committed to providing excellent care. You may receive a survey asking for feedback about your experience at our office. We value you your feedback and appreciate you taking the time to to fill it out. The [facility] leadership team is also available to discuss your experience in person, feel free to contact us  [phone].

## 2023-10-30 NOTE — Telephone Encounter (Signed)
 Received pt portion mail back PAP [employer] ,waiting on provider portion.

## 2023-11-03 NOTE — Telephone Encounter (Signed)
 Received provider portion today, but provider portion was missing direction and strength on provider portion, fax it back to office, will follow up in a couple of days.

## 2023-11-03 NOTE — Telephone Encounter (Signed)
 Faxing provider portion to [phone] today.

## 2023-11-09 ENCOUNTER — Other Ambulatory Visit: Payer: Self-pay | Admitting: Family Medicine

## 2023-11-11 ENCOUNTER — Encounter: Payer: Self-pay | Admitting: Orthopedic Surgery

## 2023-11-11 NOTE — Telephone Encounter (Signed)
 Cervical xrays dated 10/29/23:  FINDINGS: The right C2 transverse process fracture cannot be visualized on this plain film study. Advanced diffuse degenerative facet disease. Disc space narrowing at C5-6 and C6-7. Normal alignment. Prevertebral soft tissues are normal. No instability with flexion or extension.   IMPRESSION: Unable to visualize the right C2 transverse process fracture as seen on prior CT.   No acute bony abnormality seen.  No instability.     Electronically Signed   By: Franky Crease M.D.   On: 11/10/2023 23:16  I have personally reviewed the images and agree with the above interpretation.  Message to patient. She should keep follow up with me.

## 2023-11-12 ENCOUNTER — Other Ambulatory Visit: Payer: Self-pay

## 2023-11-12 DIAGNOSIS — E1122 Type 2 diabetes mellitus with diabetic chronic kidney disease: Secondary | ICD-10-CM

## 2023-11-15 ENCOUNTER — Other Ambulatory Visit: Payer: Self-pay | Admitting: Family Medicine

## 2023-11-16 NOTE — Telephone Encounter (Signed)
 Requested Prescriptions  Pending Prescriptions Disp Refills   lisinopril  (ZESTRIL ) 40 MG tablet [Pharmacy Med Name: Lisinopril  40 MG Oral Tablet] 90 tablet 1    Sig: Take 1 tablet by mouth once daily     Cardiovascular:  ACE Inhibitors Failed - 11/16/2023  5:28 PM      Failed - Last BP in normal range    BP Readings from Last 1 Encounters:  10/29/23 (!) 146/80         Passed - Cr in normal range and within 180 days    Creatinine, Ser  Date Value Ref Range Status  10/06/2023 0.65 0.44 - 1.00 mg/dL Final         Passed - K in normal range and within 180 days    Potassium  Date Value Ref Range Status  10/06/2023 4.3 3.5 - 5.1 mmol/L Final         Passed - Patient is not pregnant      Passed - Valid encounter within last 6 months    Recent Outpatient Visits           1 month ago Type 2 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 disease, without long-term current use of insulin (HCC)   [place] [facility] [place], Megan P, DO   2 months ago Dizziness   [place] [facility] [place], Megan P, DO   2 months ago Type 2 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 disease, without long-term current use of insulin [facility])   [place] [facility] [place], [place], DO

## 2023-11-18 NOTE — Telephone Encounter (Signed)
 Received provider portion but missing pg9 ,provider just faxed pg #8 we need both faxed back

## 2023-11-20 ENCOUNTER — Other Ambulatory Visit: Payer: Self-pay | Admitting: Family Medicine

## 2023-11-20 MED ORDER — TRULICITY 1.5 MG/0.5ML ~~LOC~~ SOAJ: 1.5000 mg | SUBCUTANEOUS | 0 refills | Status: DC

## 2023-11-20 MED ORDER — TRULICITY 0.75 MG/0.5ML ~~LOC~~ SOAJ: 0.7500 mg | SUBCUTANEOUS | 0 refills | Status: DC

## 2023-11-20 MED ORDER — TRULICITY 3 MG/0.5ML ~~LOC~~ SOAJ: 3.0000 mg | SUBCUTANEOUS | 0 refills | Status: DC

## 2023-11-20 MED ORDER — TRULICITY 4.5 MG/0.5ML ~~LOC~~ SOAJ: 4.5000 mg | SUBCUTANEOUS | 0 refills | Status: DC

## 2023-11-20 NOTE — Telephone Encounter (Signed)
 She was titrating back up and should be on the 4.5mg  now- what dose is she taking?
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
## 2023-11-25 ENCOUNTER — Encounter: Payer: Self-pay | Admitting: Family Medicine

## 2023-11-25 ENCOUNTER — Ambulatory Visit: Admitting: Family Medicine

## 2023-11-25 ENCOUNTER — Telehealth: Payer: Self-pay

## 2023-11-25 VITALS — BP 126/64 | HR 80 | Temp 98.0°F | Ht 66.0 in | Wt 148.0 lb

## 2023-11-25 DIAGNOSIS — E1122 Type 2 diabetes mellitus with diabetic chronic kidney disease: Secondary | ICD-10-CM

## 2023-11-25 DIAGNOSIS — Z23 Encounter for immunization: Secondary | ICD-10-CM

## 2023-11-25 DIAGNOSIS — N182 Chronic kidney disease, stage 2 (mild): Secondary | ICD-10-CM | POA: Diagnosis not present

## 2023-11-25 LAB — MICROALBUMIN, URINE WAIVED
Creatinine, Urine Waived: 100 mg/dL (ref 10–300)
Microalb, Ur Waived: 80 mg/L — ABNORMAL HIGH (ref 0–19)

## 2023-11-25 LAB — BAYER DCA HB A1C WAIVED: HB A1C (BAYER DCA - WAIVED): 7.3 % — ABNORMAL HIGH (ref 4.8–5.6)

## 2023-11-25 MED ORDER — TRULICITY 4.5 MG/0.5ML ~~LOC~~ SOAJ: 4.5000 mg | SUBCUTANEOUS | 1 refills | Status: DC

## 2023-11-25 MED ORDER — TRULICITY 3 MG/0.5ML ~~LOC~~ SOAJ: 3.0000 mg | SUBCUTANEOUS | 0 refills | Status: DC

## 2023-11-25 MED ORDER — TRULICITY 1.5 MG/0.5ML ~~LOC~~ SOAJ: 1.5000 mg | SUBCUTANEOUS | 0 refills | Status: DC

## 2023-11-25 MED ORDER — TRULICITY 0.75 MG/0.5ML ~~LOC~~ SOAJ: 0.7500 mg | SUBCUTANEOUS | 0 refills | Status: DC

## 2023-11-25 NOTE — Progress Notes (Signed)
 BP 126/64   Pulse 80   Temp 98 F (36.7 C) (Oral)   Ht 5' 6 (1.676 m)   Wt 148 lb (67.1 kg)   SpO2 100%   BMI 23.89 kg/m    Subjective:    Patient ID: Alison Hernandez, female    DOB: October 11, 1945, 78 y.o.   MRN: [id number]  HPI: Alison Hernandez is a 78 y.o. female  Chief Complaint  Patient presents with   Diabetes   DIABETES- has not been on her trulicity  for about 2 months Hypoglycemic episodes:no Polydipsia/polyuria: no Visual disturbance: no Chest pain: no Paresthesias: no Glucose Monitoring: yes  Accucheck frequency: Daily  Fasting glucose: 200 Taking Insulin?: no Blood Pressure Monitoring: not checking Retinal Examination: Not up to Date Foot Exam: Up to Date Diabetic Education: Completed Pneumovax: Up to Date Influenza: Up to Date Aspirin: yes   Relevant past medical, surgical, family and social history reviewed and updated as indicated. Interim medical history since our last visit reviewed. Allergies and medications reviewed and updated.  Review of Systems  Constitutional: Negative.   Respiratory: Negative.    Cardiovascular: Negative.   Musculoskeletal: Negative.   Skin: Negative.   Psychiatric/Behavioral: Negative.      Per HPI unless specifically indicated above     Objective:    BP 126/64   Pulse 80   Temp 98 F (36.7 C) (Oral)   Ht 5' 6 (1.676 m)   Wt 148 lb (67.1 kg)   SpO2 100%   BMI 23.89 kg/m   Wt Readings from Last 3 Encounters:  11/25/23 148 lb (67.1 kg)  10/29/23 152 lb (68.9 kg)  10/06/23 152 lb 9.6 oz (69.2 kg)    Physical Exam Vitals and nursing note reviewed.  Constitutional:      General: She is not in acute distress.    Appearance: Normal appearance. She is not ill-appearing, toxic-appearing or diaphoretic.  HENT:     Head: Normocephalic and atraumatic.     Right Ear: External ear normal.     Left Ear: External ear normal.     Nose: Nose normal.     Mouth/Throat:     Mouth: Mucous membranes are moist.      Pharynx: Oropharynx is clear.  Eyes:     General: No scleral icterus.       Right eye: No discharge.        Left eye: No discharge.     Extraocular Movements: Extraocular movements intact.     Conjunctiva/sclera: Conjunctivae normal.     Pupils: Pupils are equal, round, and reactive to light.  Cardiovascular:     Rate and Rhythm: Normal rate and regular rhythm.     Pulses: Normal pulses.     Heart sounds: Normal heart sounds. No murmur heard.    No friction rub. No gallop.  Pulmonary:     Effort: Pulmonary effort is normal. No respiratory distress.     Breath sounds: Normal breath sounds. No stridor. No wheezing, rhonchi or rales.  Chest:     Chest wall: No tenderness.  Musculoskeletal:        General: Normal range of motion.     Cervical back: Normal range of motion and neck supple.  Skin:    General: Skin is warm and dry.     Capillary Refill: Capillary refill takes less than 2 seconds.     Coloration: Skin is not jaundiced or pale.     Findings: No bruising, erythema, lesion or rash.  Neurological:     General: No focal deficit present.     Mental Status: She is alert and oriented to person, place, and time. Mental status is at baseline.  Psychiatric:        Mood and Affect: Mood normal.        Behavior: Behavior normal.        Thought Content: Thought content normal.        Judgment: Judgment normal.     Results for orders placed or performed during the hospital encounter of 10/06/23  Basic metabolic panel   Collection Time: 10/06/23  3:46 PM  Result Value Ref Range   Sodium 136 135 - 145 mmol/L   Potassium 4.3 3.5 - 5.1 mmol/L   Chloride 102 98 - 111 mmol/L   CO2 21 (L) 22 - 32 mmol/L   Glucose, Bld 124 (H) 70 - 99 mg/dL   BUN 23 8 - 23 mg/dL   Creatinine, Ser 9.34 0.44 - 1.00 mg/dL   Calcium  9.4 8.9 - 10.3 mg/dL   GFR, Estimated >39 >39 mL/min   Anion gap 13 5 - 15  CBC   Collection Time: 10/06/23  3:46 PM  Result Value Ref Range   WBC 6.6 4.0 - 10.5 K/uL    RBC 3.47 (L) 3.87 - 5.11 MIL/uL   Hemoglobin 10.3 (L) 12.0 - 15.0 g/dL   HCT 69.9 (L) 63.9 - 53.9 %   MCV 86.5 80.0 - 100.0 fL   MCH 29.7 26.0 - 34.0 pg   MCHC 34.3 30.0 - 36.0 g/dL   RDW 86.3 88.4 - 84.4 %   Platelets 213 150 - 400 K/uL   nRBC 0.0 0.0 - 0.2 %  Troponin I (High Sensitivity)   Collection Time: 10/06/23  3:46 PM  Result Value Ref Range   Troponin I (High Sensitivity) 8 <18 ng/L  Troponin I (High Sensitivity)   Collection Time: 10/06/23  5:36 PM  Result Value Ref Range   Troponin I (High Sensitivity) 9 <18 ng/L      Assessment & Plan:   Problem List Items Addressed This Visit       Endocrine   Diabetes (HCC) - Primary   Has been off her trulicity - will check on PAP. Rx for titration sent in. Continue diet and exercise. Continue to work with pharmacy. Recheck 3 months.       Relevant Medications   Dulaglutide  (TRULICITY ) 0.75 MG/0.5ML SOAJ   Dulaglutide  (TRULICITY ) 1.5 MG/0.5ML SOAJ (Start on 12/23/2023)   Dulaglutide  (TRULICITY ) 3 MG/0.5ML SOAJ (Start on 01/20/2024)   Dulaglutide  (TRULICITY ) 4.5 MG/0.5ML SOAJ (Start on 02/17/2024)   Other Relevant Orders   Microalbumin, Urine Waived   Bayer DCA Hb A1c Waived     Follow up plan: Return for 3 months with me, ASAP for AWV with Vina.

## 2023-11-25 NOTE — Progress Notes (Signed)
   11/25/2023  Patient ID: [name], female   DOB: 30-Dec-1945, 78 y.o.   MRN: [id number]  Contacted [employer] P[employer] to follow-up on the status of Ms. Ostermiller's application for Trulicity .  Patient was previously enrolled, but enrollment expired; so new application had to be completed.  Patient has been without this medication for approximately 2 months.  Company representative states they have only received page 8 of her new application.  Pages 4-7 are the patient portion, which have been received by the medication assistance team; but they were waiting to receive pages 8 & 9 from the provider to fax the entire completed application in.  Page 8 likely to have been faxed in to [facility] separately, and page 9 is still missing.  Coordinating with the PCP office to get page 9 completed and faxed to the medication assistance team, so the full application can be faxed to [facility] by them.  Channing DELENA Mealing, PharmD, DPLA

## 2023-11-25 NOTE — Assessment & Plan Note (Signed)
 Has been off her trulicity - will check on PAP. Rx for titration sent in. Continue diet and exercise. Continue to work with pharmacy. Recheck 3 months.

## 2023-11-26 ENCOUNTER — Encounter: Payer: Self-pay | Admitting: Orthopedic Surgery

## 2023-11-26 ENCOUNTER — Ambulatory Visit: Admitting: Orthopedic Surgery

## 2023-11-26 VITALS — BP 134/74 | Ht 66.0 in | Wt 148.0 lb

## 2023-11-26 DIAGNOSIS — S12100D Unspecified displaced fracture of second cervical vertebra, subsequent encounter for fracture with routine healing: Secondary | ICD-10-CM | POA: Diagnosis not present

## 2023-11-26 DIAGNOSIS — S129XXD Fracture of neck, unspecified, subsequent encounter: Secondary | ICD-10-CM

## 2023-11-27 NOTE — Telephone Encounter (Signed)
 Per CMA [place] they have faxed provider portion of application to [facility] for Trulicity .

## 2023-11-30 ENCOUNTER — Telehealth: Payer: Self-pay | Admitting: Family Medicine

## 2023-11-30 NOTE — Telephone Encounter (Signed)
 Copied from CRM [id number]. Topic: General - Other >> Nov 30, 2023 11:47 AM Carlatta H wrote: Reason for CRM: [employer] only received page 8 from the assistance medication paperwork//Please call patient to advised once refax// >> Nov 30, 2023 11:55 AM Avram MATSU wrote: Alison Hernandez is calling from [employer] asking to fax the patient portions of the application pages 4.5.6.7 fax as soon as possible patient mentioned she out of medication and is upset.

## 2023-11-30 NOTE — Telephone Encounter (Signed)
 [employer] a call to follow up on PAP spoke with a representative explain pt is missing pt portion faxed it today,representative ask to follow up with in 72 hours.

## 2023-11-30 NOTE — Telephone Encounter (Signed)
 Received a letter from [employer] needing Ins card to be faxed ,faxed it today and will follow up in a few days.

## 2023-12-01 ENCOUNTER — Telehealth: Payer: Self-pay

## 2023-12-01 ENCOUNTER — Other Ambulatory Visit: Payer: Self-pay | Admitting: Family Medicine

## 2023-12-01 MED ORDER — TRULICITY 1.5 MG/0.5ML ~~LOC~~ SOAJ: 1.5000 mg | SUBCUTANEOUS | 0 refills | Status: AC

## 2023-12-01 MED ORDER — TRULICITY 3 MG/0.5ML ~~LOC~~ SOAJ: 3.0000 mg | SUBCUTANEOUS | 0 refills | Status: DC

## 2023-12-01 MED ORDER — TRULICITY 4.5 MG/0.5ML ~~LOC~~ SOAJ: 4.5000 mg | SUBCUTANEOUS | 1 refills | Status: AC

## 2023-12-01 NOTE — Progress Notes (Signed)
   12/01/2023  Patient ID: Alison Hernandez, female   DOB: Oct 01, 1945, 78 y.o.   MRN: [id number]  Contacted [employer] to follow-up on processing status of patient's PAP application for Trulicity .  Patient has been approved to receive 1 month of Trulicity  0.75mg .  I have asked the program to expedite processing and shipping since patient has been out of medication, and they state this should arrive at her home the end of this week or beginning of next.  As of now, they have not received prescriptions for higher doses but state these may just not be entered into the system yet.  I will reach back out next week to make sure these have come through on their end to prevent any further missed doses of medication for the patient.  Channing DELENA Mealing, PharmD, DPLA

## 2023-12-02 ENCOUNTER — Telehealth: Payer: Self-pay

## 2023-12-02 NOTE — Telephone Encounter (Signed)
 Per [facility] Channing pt has been approved for [employer] ) thru 03/23/24,only there are having delays on delivering medication.

## 2023-12-02 NOTE — Telephone Encounter (Signed)
 Routing to provider to clarify. Patient is starting with 0.75 for the first month, then 1.5 mg for a month, then 3 mg for a month, and then 4.5 mg after. Is that correct?

## 2023-12-02 NOTE — Telephone Encounter (Signed)
 Copied from CRM [id number]. Topic: Clinical - Medication Question >> Dec 02, 2023  2:16 PM Kevelyn M wrote: Reason for CRM: Yancy calling to verify which dosing is accurate for the Dulaglutide  (TRULICITY ) 3. They received a script for 1.5 mg and 4.5mg  and it doesn't specify a titration.   Call back # [phone]

## 2023-12-03 ENCOUNTER — Ambulatory Visit: Admitting: Emergency Medicine

## 2023-12-03 VITALS — Ht 66.0 in | Wt 148.0 lb

## 2023-12-03 DIAGNOSIS — Z0000 Encounter for general adult medical examination without abnormal findings: Secondary | ICD-10-CM

## 2023-12-03 DIAGNOSIS — Z780 Asymptomatic menopausal state: Secondary | ICD-10-CM

## 2023-12-03 DIAGNOSIS — Z1231 Encounter for screening mammogram for malignant neoplasm of breast: Secondary | ICD-10-CM

## 2023-12-03 NOTE — Progress Notes (Signed)
 Subjective:   Alison Hernandez is a 78 y.o. who presents for a Medicare Wellness preventive visit.  As a reminder, Annual Wellness Visits don't include a physical exam, and some assessments may be limited, especially if this visit is performed virtually. We may recommend an in-person follow-up visit with your provider if needed.  Visit Complete: Virtual I connected with  Alison Hernandez on 12/03/23 by a audio enabled telemedicine application and verified that I am speaking with the correct person using two identifiers.  Patient Location: Home  Provider Location: Home Office  I discussed the limitations of evaluation and management by telemedicine. The patient expressed understanding and agreed to proceed.  Vital Signs: Because this visit was a virtual/telehealth visit, some criteria may be missing or patient reported. Any vitals not documented were not able to be obtained and vitals that have been documented are patient reported.  VideoDeclined- This patient declined Librarian, academic. Therefore the visit was completed with audio only.  Persons Participating in Visit: Patient.  AWV Questionnaire: No: Patient Medicare AWV questionnaire was not completed prior to this visit.  Cardiac Risk Factors include: advanced age (>21men, >80 women);diabetes mellitus;dyslipidemia;hypertension     Objective:    Today's Vitals   12/03/23 1119  Weight: 148 lb (67.1 kg)  Height: 5' 6 (1.676 m)   Body mass index is 23.89 kg/m.     12/03/2023   11:33 AM 10/06/2023    3:36 PM 05/21/2021   12:12 PM 05/07/2021   12:20 PM 12/03/2020    1:51 PM 11/21/2019   10:38 AM 11/06/2016    9:05 AM  Advanced Directives  Does Patient Have a Medical Advance Directive? No No No No No No No   Would patient like information on creating a medical advance directive? Yes (MAU/Ambulatory/Procedural Areas - Information given) No - Patient declined Yes (MAU/Ambulatory/Procedural Areas -  Information given) Yes (MAU/Ambulatory/Procedural Areas - Information given)   Yes (MAU/Ambulatory/Procedural Areas - Information given)      Data saved with a previous flowsheet row definition    Current Medications (verified) Outpatient Encounter Medications as of 12/03/2023  Medication Sig   acetaminophen  (TYLENOL ) 650 MG CR tablet Take 1,300 mg by mouth every 8 (eight) hours as needed for pain.   alendronate  (FOSAMAX ) 70 MG tablet Take 1 tablet (70 mg total) by mouth every 7 (seven) days. Take with a full glass of water on an empty stomach.   aspirin EC 81 MG tablet Take 81 mg by mouth daily. Swallow whole.   Blood Glucose Monitoring Suppl (ONE TOUCH ULTRA 2) w/Device KIT TEST THREE TIMES A DAY   buPROPion  (WELLBUTRIN  XL) 150 MG 24 hr tablet Take 1 tablet (150 mg total) by mouth every morning.   Dulaglutide  (TRULICITY ) 0.75 MG/0.5ML SOAJ Inject 0.75 mg into the skin once a week. (Patient taking differently: Inject into the skin once a week. Waiting on meds from PAP)   glucose blood (ONETOUCH ULTRA) test strip CHECK GLUCOSE ONCE DAILY   glucose blood test strip Use as instructed   hydrochlorothiazide  (HYDRODIURIL ) 25 MG tablet Take 25 mg by mouth daily.   Iron , Ferrous Sulfate , 325 (65 Fe) MG TABS Take 325 mg by mouth daily.   Lancets (ONETOUCH DELICA PLUS LANCET33G) MISC USE AS DIRECTED THREE TIMES A DAY   lisinopril  (ZESTRIL ) 40 MG tablet Take 1 tablet by mouth once daily   meclizine  (ANTIVERT ) 25 MG tablet Take 1 tablet (25 mg total) by mouth 3 (three) times daily  as needed for dizziness.   metFORMIN  (GLUCOPHAGE ) 1000 MG tablet Take 1 tablet (1,000 mg total) by mouth 2 (two) times daily with a meal.   Multiple Vitamin (MULTIVITAMIN) tablet Take 1 tablet by mouth daily.   nystatin  (NYAMYC ) powder APPLY TOPICALLY FOUR TIMES A DAY AS DIRECTED (Patient taking differently: APPLY TOPICALLY FOUR TIMES A DAY AS DIRECTED PRN)   omeprazole  (PRILOSEC) 20 MG capsule Take 1 capsule (20 mg total) by  mouth 2 (two) times daily before a meal.   ondansetron  (ZOFRAN -ODT) 8 MG disintegrating tablet PLACE 1 TABLET ON TONGUE AND ALLOW TO DISSOLVE EVERY 8 HOURS AS NEEDED FOR NAUSEA & VOMITING   rosuvastatin  (CRESTOR ) 10 MG tablet Take 1 tablet (10 mg total) by mouth daily.   diclofenac  Sodium (VOLTAREN ) 1 % GEL APPLY 4 GRAMS TOPICALLY FOUR TIMES A DAY AS DIRECTED (Patient not taking: Reported on 12/03/2023)   Dulaglutide  (TRULICITY ) 1.5 MG/0.5ML SOAJ Inject 1.5 mg into the skin once a week for 28 days.   Dulaglutide  (TRULICITY ) 3 MG/0.5ML SOAJ Inject 3 mg as directed once a week.   Dulaglutide  (TRULICITY ) 4.5 MG/0.5ML SOAJ Inject 4.5 mg as directed once a week.   glucose blood test strip Use as instructed (Patient not taking: Reported on 12/03/2023)   No facility-administered encounter medications on file as of 12/03/2023.    Allergies (verified) Lovastatin , Bydureon  [exenatide ], and Losartan   History: Past Medical History:  Diagnosis Date   Arthritis    hands   Diabetes mellitus without complication (HCC)    GERD (gastroesophageal reflux disease)    Hyperlipidemia    Hypertension    TIA (transient ischemic attack)    no deficits   Vertigo    Past Surgical History:  Procedure Laterality Date   BREAST BIOPSY Left 20+ yrs ago   benign   BREAST BIOPSY Left 20+ yrs ago   benign   CATARACT EXTRACTION W/PHACO Right 05/07/2021   Procedure: CATARACT EXTRACTION PHACO AND INTRAOCULAR LENS PLACEMENT (IOC) RIGHT DIABETIC 19.49 02:17.9;  Surgeon: Alison Fallow, MD;  Location: [facility];  Service: Ophthalmology;  Laterality: Right;  Diabetic   CATARACT EXTRACTION W/PHACO Left 05/21/2021   Procedure: CATARACT EXTRACTION PHACO AND INTRAOCULAR LENS PLACEMENT (IOC) LEFT DIABETIC 18.57 01:42.9 ;  Surgeon: Alison Feliciano Hugger, MD;  Location: [facility];  Service: Ophthalmology;  Laterality: Left;  Diabetic   ROTATOR CUFF REPAIR Right 09/30/2016   SIGMOIDOSCOPY  1990   TOTAL  ABDOMINAL HYSTERECTOMY     total   Family History  Problem Relation Age of Onset   Diabetes Mother    Heart disease Mother    Cancer Father        esophagus/ lung   Cancer Sister        Ovarian   Diabetes Maternal Grandmother    Breast cancer Neg Hx    Social History   Socioeconomic History   Marital status: Married    Spouse name: Ubaldo   Number of children: 2   Years of education: Not on file   Highest education level: 12th grade  Occupational History   Not on file  Tobacco Use   Smoking status: Never   Smokeless tobacco: Never  Vaping Use   Vaping status: Never Used  Substance and Sexual Activity   Alcohol use: No   Drug use: No   Sexual activity: Not Currently    Birth control/protection: None  Other Topics Concern   Not on file  Social History Narrative   Not on  file   Social Drivers of Health   Financial Resource Strain: Medium Risk (12/03/2023)   Overall Financial Resource Strain (CARDIA)    Difficulty of Paying Living Expenses: Somewhat hard  Food Insecurity: No Food Insecurity (12/03/2023)   Hunger Vital Sign    Worried About Running Out of Food in the Last Year: Never true    Ran Out of Food in the Last Year: Never true  Transportation Needs: No Transportation Needs (12/03/2023)   PRAPARE - Administrator, Civil Service (Medical): No    Lack of Transportation (Non-Medical): No  Physical Activity: Inactive (12/03/2023)   Exercise Vital Sign    Days of Exercise per Week: 0 days    Minutes of Exercise per Session: 0 min  Stress: No Stress Concern Present (12/03/2023)   [employer] of Occupational Health - Occupational Stress Questionnaire    Feeling of Stress: Not at all  Social Connections: Moderately Isolated (12/03/2023)   Social Connection and Isolation Panel    Frequency of Communication with Friends and Family: Three times a week    Frequency of Social Gatherings with Friends and Family: More than three times a week    Attends  Religious Services: Never    Database administrator or Organizations: No    Attends Engineer, structural: Never    Marital Status: Married    Tobacco Counseling Counseling given: Not Answered    Clinical Intake:  Pre-visit preparation completed: Yes  Pain : No/denies pain     BMI - recorded: 23.89 Nutritional Status: BMI of 19-24  Normal Nutritional Risks: None Diabetes: Yes CBG done?: No Did pt. bring in CBG monitor from home?: No  Lab Results  Component Value Date   HGBA1C 7.3 (H) 11/25/2023   HGBA1C 8.5 (H) 08/20/2023   HGBA1C 6.9 (H) 03/31/2023     How often do you need to have someone help you when you read instructions, pamphlets, or other written materials from your doctor or pharmacy?: 1 - Never  Interpreter Needed?: No  Information entered by :: Vina Ned, CMA   Activities of Daily Living     12/03/2023   11:22 AM 11/25/2023    8:20 AM  In your present state of health, do you have any difficulty performing the following activities:  Hearing? 0 1  Vision? 0 0  Difficulty concentrating or making decisions? 0 0  Walking or climbing stairs? 0 0  Dressing or bathing? 0 0  Doing errands, shopping? 0 0  Preparing Food and eating ? N N  Using the Toilet? N N  In the past six months, have you accidently leaked urine? Y N  Comment wears pad   Do you have problems with loss of bowel control? N N  Managing your Medications? N N  Managing your Finances? N N  Housekeeping or managing your Housekeeping? N N    Patient Care Team: Vicci Duwaine SQUIBB, DO as PCP - General (Family Medicine) Hilma Hastings, PA-C as Physician Assistant (Neurosurgery) Deanna Channing LABOR, [facility] (Pharmacist) Alison Feliciano Hugger, MD as Consulting Physician (Ophthalmology)  I have updated your Care Teams any recent Medical Services you may have received from other providers in the past year.     Assessment:   This is a routine wellness examination for [place].  Hearing/Vision  screen Hearing Screening - Comments:: Denies hearing loss  Vision Screening - Comments:: Needs DM eye exam, Dr. Feliciano Alison, [place]. Patient to call and schedule   Goals  Addressed               This Visit's Progress     Increase physical activity (pt-stated)         Depression Screen     12/03/2023   11:30 AM 11/25/2023    8:23 AM 10/06/2023    2:08 PM 08/20/2023    1:11 PM 12/29/2022    9:51 AM 07/22/2022    9:38 AM 04/23/2022    9:51 AM  PHQ 2/9 Scores  PHQ - 2 Score 0 0 0 0 0 0 0  PHQ- 9 Score 0 0  0 0 0 0    Fall Risk     12/03/2023   11:34 AM 10/06/2023    2:08 PM 12/29/2022    9:50 AM 07/22/2022    9:38 AM 04/23/2022    9:52 AM  Fall Risk   Falls in the past year? 0 0 0 1 0  Number falls in past yr: 0 0 0 0 0  Injury with Fall? 0 0 0 0 0  Risk for fall due to : No Fall Risks No Fall Risks No Fall Risks History of fall(s) No Fall Risks  Follow up Falls evaluation completed Falls evaluation completed Falls evaluation completed Falls evaluation completed Falls evaluation completed    MEDICARE RISK AT HOME:  Medicare Risk at Home Any stairs in or around the home?: Yes (4 steps) If so, are there any without handrails?: Yes Home free of loose throw rugs in walkways, pet beds, electrical cords, etc?: Yes Adequate lighting in your home to reduce risk of falls?: Yes Life alert?: No Use of a cane, walker or w/c?: No Grab bars in the bathroom?: Yes Shower chair or bench in shower?: Yes Elevated toilet seat or a handicapped toilet?: No  TIMED UP AND GO:  Was the test performed?  No  Cognitive Function: 6CIT completed        12/03/2023   11:36 AM 11/25/2023    8:22 AM 01/20/2022   10:11 AM 12/03/2020    1:54 PM 11/21/2019   10:40 AM  6CIT Screen  What Year? 0 points 0 points 0 points 0 points 0 points  What month? 0 points 0 points 0 points 0 points 0 points  What time? 0 points 0 points 0 points 0 points 0 points  Count back from 20 0 points 0 points 0  points 0 points 0 points  Months in reverse 0 points 0 points 0 points 0 points 0 points  Repeat phrase 0 points 0 points 0 points 2 points 0 points  Total Score 0 points 0 points 0 points 2 points 0 points    Immunizations Immunization History  Administered Date(s) Administered   Fluad Quad(high Dose 65+) 11/18/2018, 01/17/2020, 12/17/2020, 12/12/2021   Fluad Trivalent(High Dose 65+) 12/29/2022   INFLUENZA, HIGH DOSE SEASONAL PF 11/28/2015, 04/08/2017, 04/07/2018, 11/25/2023   Influenza,inj,Quad PF,6+ Mos 03/28/2015   PFIZER(Purple Top)SARS-COV-2 Vaccination 06/02/2019, 06/28/2019   Pfizer(Comirnaty)Fall Seasonal Vaccine 12 years and older 02/04/2023   Pneumococcal Conjugate-13 08/29/2015   Pneumococcal Polysaccharide-23 03/24/1997, 11/06/2016   Td 05/12/2008, 11/18/2018    Screening Tests Health Maintenance  Topic Date Due   Zoster Vaccines- Shingrix (1 of 2) Never done   OPHTHALMOLOGY EXAM  04/11/2022   Mammogram  06/03/2023   COVID-19 Vaccine (4 - 2024-25 season) 11/23/2023   DEXA SCAN  05/20/2024   HEMOGLOBIN A1C  05/24/2024   FOOT EXAM  08/19/2024   Diabetic kidney evaluation - eGFR  measurement  10/05/2024   Diabetic kidney evaluation - Urine ACR  11/24/2024   Medicare Annual Wellness (AWV)  12/02/2024   DTaP/Tdap/Td (3 - Tdap) 11/17/2028   Pneumococcal Vaccine: 50+ Years  Completed   Influenza Vaccine  Completed   Hepatitis C Screening  Completed   HPV VACCINES  Aged Out   Meningococcal B Vaccine  Aged Out    Health Maintenance Items Addressed: Mammogram ordered, DEXA ordered, See Nurse Notes at the end of this note  Additional Screening:  Vision Screening: Recommended annual ophthalmology exams for early detection of glaucoma and other disorders of the eye. Is the patient up to date with their annual eye exam?  No  Who is the provider or what is the name of the office in which the patient attends annual eye exams? Dr. Feliciano Ober @ [facility] [place].  Patient will call to schedule.  Dental Screening: Recommended annual dental exams for proper oral hygiene  Community Resource Referral / Chronic Care Management: CRR required this visit?  No   CCM required this visit?  No   Plan:    I have personally reviewed and noted the following in the patient's chart:   Medical and social history Use of alcohol, tobacco or illicit drugs  Current medications and supplements including opioid prescriptions. Patient is not currently taking opioid prescriptions. Functional ability and status Nutritional status Physical activity Advanced directives List of other physicians Hospitalizations, surgeries, and ER visits in previous 12 months Vitals Screenings to include cognitive, depression, and falls Referrals and appointments  In addition, I have reviewed and discussed with patient certain preventive protocols, quality metrics, and best practice recommendations. A written personalized care plan for preventive services as well as general preventive health recommendations were provided to patient.   Vina Ned, CMA   12/03/2023   After Visit Summary: (MyChart) Due to this being a telephonic visit, the after visit summary with patients personalized plan was offered to patient via MyChart   Notes:  Placed order for MMG (past due) Placed order for DEXA scan (due ~ 05/20/24) Needs DM eye exam. Patient to call and schedule  Will get Covid vaccine when available in office. Declined DM & Nutrition education referral Declined Shingles vaccine at this time. Colon cancer screening no longer recommended due to age

## 2023-12-03 NOTE — Telephone Encounter (Signed)
 KL handled

## 2023-12-03 NOTE — Telephone Encounter (Signed)
 That is correct

## 2023-12-03 NOTE — Patient Instructions (Signed)
 Alison Hernandez,  Thank you for taking the time for your Medicare Wellness Visit. I appreciate your continued commitment to your health goals. Please review the care plan we discussed, and feel free to reach out if I can assist you further.  Medicare recommends these wellness visits once per year to help you and your care team stay ahead of potential health issues. These visits are designed to focus on prevention, allowing your provider to concentrate on managing your acute and chronic conditions during your regular appointments.  Please note that Annual Wellness Visits do not include a physical exam. Some assessments may be limited, especially if the visit was conducted virtually. If needed, we may recommend a separate in-person follow-up with your provider.  Ongoing Care Seeing your primary care provider every 3 to 6 months helps us  monitor your health and provide consistent, personalized care.   Referrals If a referral was made during today's visit and you haven't received any updates within two weeks, please contact the referred provider directly to check on the status.  Recommended Screenings:  Call and schedule a diabetic eye exam at your earliest convenience. Get the covid vaccine when available in the office.  Please call to schedule your mammogram (past due) and bone density scan (due 05/21/24):  [facility] at [facility] Address: [address], [place], [place] Phone: [phone]  [facility] [address] [place], [place] [address] Phone: [phone]     Health Maintenance  Topic Date Due   Zoster (Shingles) Vaccine (1 of 2) Never done   Eye exam for diabetics  04/11/2022   Breast Cancer Screening  06/03/2023   COVID-19 Vaccine (4 - 2024-25 season) 11/23/2023   DEXA scan (bone density measurement)  05/20/2024   Hemoglobin A1C  05/24/2024   Complete foot exam   08/19/2024   Yearly kidney function blood  test for diabetes  10/05/2024   Yearly kidney health urinalysis for diabetes  11/24/2024   Medicare Annual Wellness Visit  12/02/2024   DTaP/Tdap/Td vaccine (3 - Tdap) 11/17/2028   Pneumococcal Vaccine for age over 13  Completed   Flu Shot  Completed   Hepatitis C Screening  Completed   HPV Vaccine  Aged Out   Meningitis B Vaccine  Aged Out       12/03/2023   11:33 AM  Advanced Directives  Does Patient Have a Medical Advance Directive? No  Would patient like information on creating a medical advance directive? Yes (MAU/Ambulatory/Procedural Areas - Information given)   Advance Care Planning is important because it: Ensures you receive medical care that aligns with your values, goals, and preferences. Provides guidance to your family and loved ones, reducing the emotional burden of decision-making during critical moments. Information on Advanced Care Planning can be found at [place]  Secretary of [place] Advance Health Care Directives Advance Health Care Directives ([email or web address]/)  You may also get the forms at your doctor's office.   Vision: Annual vision screenings are recommended for early detection of glaucoma, cataracts, and diabetic retinopathy. These exams can also reveal signs of chronic conditions such as diabetes and high blood pressure.  Dental: Annual dental screenings help detect early signs of oral cancer, gum disease, and other conditions linked to overall health, including heart disease and diabetes.  Please see the attached documents for additional preventive care recommendations.   Fall Prevention in the Home, Adult Falls can cause injuries and affect people of all ages. There are many  simple things that you can do to make your home safe and to help prevent falls. If you need it, ask for help making these changes. What actions can I take to prevent falls? General information Use good lighting in all rooms. Make sure to: Replace any light bulbs that burn out. Turn  on lights if it is dark and use night-lights. Keep items that you use often in easy-to-reach places. Lower the shelves around your home if needed. Move furniture so that there are clear paths around it. Do not keep throw rugs or other things on the floor that can make you trip. If any of your floors are uneven, fix them. Add color or contrast paint or tape to clearly mark and help you see: Grab bars or handrails. First and last steps of staircases. Where the edge of each step is. If you use a ladder or stepladder: Make sure that it is fully opened. Do not climb a closed ladder. Make sure the sides of the ladder are locked in place. Have someone hold the ladder while you use it. Know where your pets are as you move through your home. What can I do in the bathroom?     Keep the floor dry. Clean up any water that is on the floor right away. Remove soap buildup in the bathtub or shower. Buildup makes bathtubs and showers slippery. Use non-skid mats or decals on the floor of the bathtub or shower. Attach bath mats securely with double-sided, non-slip rug tape. If you need to sit down while you are in the shower, use a non-slip stool. Install grab bars by the toilet and in the bathtub and shower. Do not use towel bars as grab bars. What can I do in the bedroom? Make sure that you have a light by your bed that is easy to reach. Do not use any sheets or blankets on your bed that hang to the floor. Have a firm bench or chair with side arms that you can use for support when you get dressed. What can I do in the kitchen? Clean up any spills right away. If you need to reach something above you, use a sturdy step stool that has a grab bar. Keep electrical cables out of the way. Do not use floor polish or wax that makes floors slippery. What can I do with my stairs? Do not leave anything on the stairs. Make sure that you have a light switch at the top and the bottom of the stairs. Have them  installed if you do not have them. Make sure that there are handrails on both sides of the stairs. Fix handrails that are broken or loose. Make sure that handrails are as long as the staircases. Install non-slip stair treads on all stairs in your home if they do not have carpet. Avoid having throw rugs at the top or bottom of stairs, or secure the rugs with carpet tape to prevent them from moving. Choose a carpet design that does not hide the edge of steps on the stairs. Make sure that carpet is firmly attached to the stairs. Fix any carpet that is loose or worn. What can I do on the outside of my home? Use bright outdoor lighting. Repair the edges of walkways and driveways and fix any cracks. Clear paths of anything that can make you trip, such as tools or rocks. Add color or contrast paint or tape to clearly mark and help you see high doorway thresholds. Trim any  bushes or trees on the main path into your home. Check that handrails are securely fastened and in good repair. Both sides of all steps should have handrails. Install guardrails along the edges of any raised decks or porches. Have leaves, snow, and ice cleared regularly. Use sand, salt, or ice melt on walkways during winter months if you live where there is ice and snow. In the garage, clean up any spills right away, including grease or oil spills. What other actions can I take? Review your medicines with your health care provider. Some medicines can make you confused or feel dizzy. This can increase your chance of falling. Wear closed-toe shoes that fit well and support your feet. Wear shoes that have rubber soles and low heels. Use a cane, walker, scooter, or crutches that help you move around if needed. Talk with your provider about other ways that you can decrease your risk of falls. This may include seeing a physical therapist to learn to do exercises to improve movement and strength. Where to find more information Centers for  Disease Control and Prevention, STEAD[employer]: TonerPromos.no [employer] on Aging: BaseRingTones.pl National Institute on Aging: BaseRingTones.pl Contact a health care provider if: You are afraid of falling at home. You feel weak, drowsy, or dizzy at home. You fall at home. Get help right away if you: Lose consciousness or have trouble moving after a fall. Have a fall that causes a head injury. These symptoms may be an emergency. Get help right away. Call 911. Do not wait to see if the symptoms will go away. Do not drive yourself to the hospital. This information is not intended to replace advice given to you by your health care provider. Make sure you discuss any questions you have with your health care provider. Document Revised: 11/11/2021 Document Reviewed: 11/11/2021 Elsevier Patient Education  2024 [employer].

## 2023-12-08 ENCOUNTER — Telehealth: Payer: Self-pay

## 2023-12-08 NOTE — Progress Notes (Signed)
   12/08/2023  Patient ID: Alison Hernandez, female   DOB: 09-19-1945, 78 y.o.   MRN: [id number]  Contacted [facility] to verify order for future doses of Trulicity  were received to prevent missed doses moving forward.  They have received prescriptions for full titration from 0.75mg  weekly to 4.5mg  weekly now.  The 0.75mg  dose was supposed to go out last week, but pharmacist at [facility] states this along with a 1 month supply of additional doses will ship out this week.  I am contacting the patient to make her aware, advise on titration plan, and schedule a telephone visit in 4-6 weeks to see how she is tolerating the Trulicity .  Channing DELENA Mealing, PharmD, DPLA

## 2023-12-10 ENCOUNTER — Telehealth: Payer: Self-pay

## 2023-12-10 NOTE — Progress Notes (Signed)
   12/10/2023  Patient ID: Alison Hernandez, female   DOB: May 13, 1945, 78 y.o.   MRN: [id number]  Contacted [facility] to verify all strengths of Trulicity  have been shipped, and they were on Tuesday.  [employer] tracking reflects these were delivered to patient's home yesterday.  Attempted to call [name] to verify she has received the medication, see if there are any questions in regard to titration plan, and schedule a telephone follow-up in 4 weeks.  I was not able to leave the patient but did leave a voicemail with my direct phone number for her to return my call.  Channing DELENA Mealing, PharmD, DPLA

## 2023-12-15 ENCOUNTER — Other Ambulatory Visit: Payer: Self-pay | Admitting: Family Medicine

## 2023-12-16 NOTE — Telephone Encounter (Signed)
 Requested Prescriptions  Pending Prescriptions Disp Refills   FEROSUL 325 (65 Fe) MG tablet [Pharmacy Med Name: FeroSul 325 (65 Fe) MG Oral Tablet] 90 tablet 0    Sig: Take 1 tablet by mouth once daily     Endocrinology:  Minerals - Iron  Supplementation Failed - 12/16/2023  8:28 AM      Failed - HGB in normal range and within 360 days    Hemoglobin  Date Value Ref Range Status  10/06/2023 10.3 (L) 12.0 - 15.0 g/dL Final  93/79/7974 9.7 (L) 11.1 - 15.9 g/dL Final         Failed - HCT in normal range and within 360 days    HCT  Date Value Ref Range Status  10/06/2023 30.0 (L) 36.0 - 46.0 % Final   Hematocrit  Date Value Ref Range Status  09/11/2023 30.1 (L) 34.0 - 46.6 % Final         Failed - RBC in normal range and within 360 days    RBC  Date Value Ref Range Status  10/06/2023 3.47 (L) 3.87 - 5.11 MIL/uL Final         Failed - Fe (serum) in normal range and within 360 days    Iron   Date Value Ref Range Status  04/04/2020 79 27 - 139 ug/dL Final   Iron  Saturation  Date Value Ref Range Status  04/04/2020 21 15 - 55 % Final         Failed - Ferritin in normal range and within 360 days    Ferritin  Date Value Ref Range Status  04/04/2020 44 15 - 150 ng/mL Final         Passed - Valid encounter within last 12 months    Recent Outpatient Visits           3 weeks ago Type 2 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 disease, without long-term current use of insulin  (HCC)   [place] [facility] [place], Megan P, DO   2 months ago Type 2 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 disease, without long-term current use of insulin  (HCC)   [place] [facility] [place], Megan P, DO   3 months ago Dizziness   [place] [facility] [place], Megan P, DO   3 months ago Type 2 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 disease, without long-term current use of insulin  [facility])    [facility] [place],  Ayden, DO

## 2023-12-18 ENCOUNTER — Emergency Department

## 2023-12-18 ENCOUNTER — Inpatient Hospital Stay
Admission: EM | Admit: 2023-12-18 | Discharge: 2023-12-22 | DRG: 280 | Disposition: A | Discharge status: Home / Self Care | Source: Ambulatory Visit | Attending: Internal Medicine | Admitting: Internal Medicine

## 2023-12-18 ENCOUNTER — Ambulatory Visit: Payer: Self-pay

## 2023-12-18 ENCOUNTER — Other Ambulatory Visit: Payer: Self-pay

## 2023-12-18 ENCOUNTER — Encounter: Payer: Self-pay | Admitting: Emergency Medicine

## 2023-12-18 DIAGNOSIS — Z5986 Financial insecurity: Secondary | ICD-10-CM

## 2023-12-18 DIAGNOSIS — Z961 Presence of intraocular lens: Secondary | ICD-10-CM | POA: Diagnosis present

## 2023-12-18 DIAGNOSIS — F32A Depression, unspecified: Secondary | ICD-10-CM | POA: Diagnosis present

## 2023-12-18 DIAGNOSIS — E1122 Type 2 diabetes mellitus with diabetic chronic kidney disease: Secondary | ICD-10-CM | POA: Diagnosis present

## 2023-12-18 DIAGNOSIS — E222 Syndrome of inappropriate secretion of antidiuretic hormone: Secondary | ICD-10-CM | POA: Diagnosis present

## 2023-12-18 DIAGNOSIS — Z7985 Long-term (current) use of injectable non-insulin antidiabetic drugs: Secondary | ICD-10-CM | POA: Diagnosis not present

## 2023-12-18 DIAGNOSIS — Z723 Lack of physical exercise: Secondary | ICD-10-CM | POA: Diagnosis not present

## 2023-12-18 DIAGNOSIS — E785 Hyperlipidemia, unspecified: Secondary | ICD-10-CM | POA: Diagnosis present

## 2023-12-18 DIAGNOSIS — D638 Anemia in other chronic diseases classified elsewhere: Secondary | ICD-10-CM | POA: Diagnosis present

## 2023-12-18 DIAGNOSIS — I214 Non-ST elevation (NSTEMI) myocardial infarction: Principal | ICD-10-CM | POA: Diagnosis present

## 2023-12-18 DIAGNOSIS — R42 Dizziness and giddiness: Secondary | ICD-10-CM | POA: Diagnosis not present

## 2023-12-18 DIAGNOSIS — Z9842 Cataract extraction status, left eye: Secondary | ICD-10-CM | POA: Diagnosis not present

## 2023-12-18 DIAGNOSIS — I10 Essential (primary) hypertension: Secondary | ICD-10-CM | POA: Diagnosis not present

## 2023-12-18 DIAGNOSIS — I2119 ST elevation (STEMI) myocardial infarction involving other coronary artery of inferior wall: Secondary | ICD-10-CM | POA: Diagnosis not present

## 2023-12-18 DIAGNOSIS — Z8673 Personal history of transient ischemic attack (TIA), and cerebral infarction without residual deficits: Secondary | ICD-10-CM

## 2023-12-18 DIAGNOSIS — I428 Other cardiomyopathies: Secondary | ICD-10-CM | POA: Diagnosis not present

## 2023-12-18 DIAGNOSIS — K862 Cyst of pancreas: Secondary | ICD-10-CM | POA: Diagnosis present

## 2023-12-18 DIAGNOSIS — Z7982 Long term (current) use of aspirin: Secondary | ICD-10-CM | POA: Diagnosis not present

## 2023-12-18 DIAGNOSIS — K219 Gastro-esophageal reflux disease without esophagitis: Secondary | ICD-10-CM | POA: Diagnosis present

## 2023-12-18 DIAGNOSIS — Z7983 Long term (current) use of bisphosphonates: Secondary | ICD-10-CM

## 2023-12-18 DIAGNOSIS — I130 Hypertensive heart and chronic kidney disease with heart failure and stage 1 through stage 4 chronic kidney disease, or unspecified chronic kidney disease: Secondary | ICD-10-CM | POA: Diagnosis present

## 2023-12-18 DIAGNOSIS — E782 Mixed hyperlipidemia: Secondary | ICD-10-CM | POA: Diagnosis not present

## 2023-12-18 DIAGNOSIS — M546 Pain in thoracic spine: Secondary | ICD-10-CM | POA: Diagnosis not present

## 2023-12-18 DIAGNOSIS — Z8249 Family history of ischemic heart disease and other diseases of the circulatory system: Secondary | ICD-10-CM | POA: Diagnosis not present

## 2023-12-18 DIAGNOSIS — I447 Left bundle-branch block, unspecified: Secondary | ICD-10-CM | POA: Diagnosis present

## 2023-12-18 DIAGNOSIS — N281 Cyst of kidney, acquired: Secondary | ICD-10-CM | POA: Diagnosis not present

## 2023-12-18 DIAGNOSIS — I5043 Acute on chronic combined systolic (congestive) and diastolic (congestive) heart failure: Secondary | ICD-10-CM | POA: Diagnosis present

## 2023-12-18 DIAGNOSIS — D649 Anemia, unspecified: Secondary | ICD-10-CM | POA: Diagnosis present

## 2023-12-18 DIAGNOSIS — I700 Atherosclerosis of aorta: Secondary | ICD-10-CM | POA: Diagnosis not present

## 2023-12-18 DIAGNOSIS — I2510 Atherosclerotic heart disease of native coronary artery without angina pectoris: Secondary | ICD-10-CM | POA: Diagnosis present

## 2023-12-18 DIAGNOSIS — I129 Hypertensive chronic kidney disease with stage 1 through stage 4 chronic kidney disease, or unspecified chronic kidney disease: Secondary | ICD-10-CM | POA: Diagnosis present

## 2023-12-18 DIAGNOSIS — Z833 Family history of diabetes mellitus: Secondary | ICD-10-CM | POA: Diagnosis not present

## 2023-12-18 DIAGNOSIS — Z7984 Long term (current) use of oral hypoglycemic drugs: Secondary | ICD-10-CM | POA: Diagnosis not present

## 2023-12-18 DIAGNOSIS — E119 Type 2 diabetes mellitus without complications: Secondary | ICD-10-CM

## 2023-12-18 DIAGNOSIS — K5750 Diverticulosis of both small and large intestine without perforation or abscess without bleeding: Secondary | ICD-10-CM | POA: Diagnosis not present

## 2023-12-18 DIAGNOSIS — N182 Chronic kidney disease, stage 2 (mild): Secondary | ICD-10-CM | POA: Diagnosis not present

## 2023-12-18 DIAGNOSIS — Z79899 Other long term (current) drug therapy: Secondary | ICD-10-CM | POA: Diagnosis not present

## 2023-12-18 DIAGNOSIS — N83291 Other ovarian cyst, right side: Secondary | ICD-10-CM | POA: Diagnosis not present

## 2023-12-18 DIAGNOSIS — F3341 Major depressive disorder, recurrent, in partial remission: Secondary | ICD-10-CM

## 2023-12-18 DIAGNOSIS — R110 Nausea: Secondary | ICD-10-CM | POA: Diagnosis not present

## 2023-12-18 DIAGNOSIS — Z9841 Cataract extraction status, right eye: Secondary | ICD-10-CM

## 2023-12-18 HISTORY — DX: Non-ST elevation (NSTEMI) myocardial infarction: I21.4

## 2023-12-18 LAB — TROPONIN I (HIGH SENSITIVITY)
Troponin I (High Sensitivity): 21 ng/L — ABNORMAL HIGH (ref <18)
Troponin I (High Sensitivity): 393 ng/L (ref <18)
Troponin I (High Sensitivity): 820 ng/L (ref <18)

## 2023-12-18 LAB — URINALYSIS, ROUTINE W REFLEX MICROSCOPIC
Bacteria, UA: NONE SEEN
Bilirubin Urine: NEGATIVE
Glucose, UA: NEGATIVE mg/dL
Hgb urine dipstick: NEGATIVE
Ketones, ur: NEGATIVE mg/dL
Nitrite: NEGATIVE
Protein, ur: NEGATIVE mg/dL
Specific Gravity, Urine: 1.026 (ref 1.005–1.030)
Squamous Epithelial / HPF: 0 /HPF (ref 0–5)
WBC, UA: >50 WBC/hpf (ref 0–5)
pH: 6 (ref 5.0–8.0)

## 2023-12-18 LAB — COMPREHENSIVE METABOLIC PANEL WITH GFR
ALT: 10 U/L (ref 0–44)
AST: 14 U/L — ABNORMAL LOW (ref 15–41)
Albumin: 3.8 g/dL (ref 3.5–5.0)
Alkaline Phosphatase: 70 U/L (ref 38–126)
Anion gap: 10 (ref 5–15)
BUN: 20 mg/dL (ref 8–23)
CO2: 22 mmol/L (ref 22–32)
Calcium: 9.2 mg/dL (ref 8.9–10.3)
Chloride: 99 mmol/L (ref 98–111)
Creatinine, Ser: 0.94 mg/dL (ref 0.44–1.00)
GFR, Estimated: >60 mL/min (ref >60)
Glucose, Bld: 153 mg/dL — ABNORMAL HIGH (ref 70–99)
Potassium: 4.2 mmol/L (ref 3.5–5.1)
Sodium: 131 mmol/L — ABNORMAL LOW (ref 135–145)
Total Bilirubin: 0.7 mg/dL (ref 0.0–1.2)
Total Protein: 6.8 g/dL (ref 6.5–8.1)

## 2023-12-18 LAB — PROTIME-INR
INR: 1.1 (ref 0.8–1.2)
Prothrombin Time: 14.6 s (ref 11.4–15.2)

## 2023-12-18 LAB — GLUCOSE, CAPILLARY: Glucose-Capillary: 143 mg/dL — ABNORMAL HIGH (ref 70–99)

## 2023-12-18 LAB — CBC
HCT: 32.3 % — ABNORMAL LOW (ref 36.0–46.0)
Hemoglobin: 11.2 g/dL — ABNORMAL LOW (ref 12.0–15.0)
MCH: 29.4 pg (ref 26.0–34.0)
MCHC: 34.7 g/dL (ref 30.0–36.0)
MCV: 84.8 fL (ref 80.0–100.0)
Platelets: 213 K/uL (ref 150–400)
RBC: 3.81 MIL/uL — ABNORMAL LOW (ref 3.87–5.11)
RDW: 12.7 % (ref 11.5–15.5)
WBC: 6.3 K/uL (ref 4.0–10.5)
nRBC: 0 % (ref 0.0–0.2)

## 2023-12-18 LAB — APTT: aPTT: 32 s (ref 24–36)

## 2023-12-18 LAB — CBG MONITORING, ED: Glucose-Capillary: 154 mg/dL — ABNORMAL HIGH (ref 70–99)

## 2023-12-18 LAB — HEPARIN LEVEL (UNFRACTIONATED): Heparin Unfractionated: 0.22 [IU]/mL — ABNORMAL LOW (ref 0.30–0.70)

## 2023-12-18 LAB — LIPASE, BLOOD: Lipase: 61 U/L — ABNORMAL HIGH (ref 11–51)

## 2023-12-18 MED ORDER — LISINOPRIL 20 MG PO TABS
40.0000 mg | ORAL_TABLET | Freq: Every day | ORAL | Status: DC
  Administered 2023-12-19 – 2023-12-22 (×4): 40 mg via ORAL
  Filled 2023-12-18 (×4): qty 2

## 2023-12-18 MED ORDER — ASPIRIN 300 MG RE SUPP: 300.0000 mg | RECTAL | Status: AC

## 2023-12-18 MED ORDER — ONDANSETRON HCL 4 MG/2ML IJ SOLN
4.0000 mg | Freq: Four times a day (QID) | INTRAMUSCULAR | Status: DC | PRN
  Administered 2023-12-19 – 2023-12-21 (×2): 4 mg via INTRAVENOUS
  Filled 2023-12-18 (×2): qty 2

## 2023-12-18 MED ORDER — ROSUVASTATIN CALCIUM 10 MG PO TABS
10.0000 mg | ORAL_TABLET | Freq: Every day | ORAL | Status: DC
  Administered 2023-12-19 – 2023-12-22 (×4): 10 mg via ORAL
  Filled 2023-12-18 (×4): qty 1

## 2023-12-18 MED ORDER — PANTOPRAZOLE SODIUM 40 MG PO TBEC
40.0000 mg | DELAYED_RELEASE_TABLET | Freq: Every day | ORAL | Status: DC
  Administered 2023-12-18 – 2023-12-22 (×5): 40 mg via ORAL
  Filled 2023-12-18 (×5): qty 1

## 2023-12-18 MED ORDER — LIDOCAINE 5 % EX PTCH
2.0000 | MEDICATED_PATCH | CUTANEOUS | Status: DC
  Administered 2023-12-18 – 2023-12-20 (×3): 2 via TRANSDERMAL
  Filled 2023-12-18 (×5): qty 2

## 2023-12-18 MED ORDER — ASPIRIN 81 MG PO TBEC
81.0000 mg | DELAYED_RELEASE_TABLET | Freq: Every day | ORAL | Status: DC
  Administered 2023-12-19 – 2023-12-22 (×3): 81 mg via ORAL
  Filled 2023-12-18 (×3): qty 1

## 2023-12-18 MED ORDER — ONDANSETRON HCL 4 MG/2ML IJ SOLN
4.0000 mg | Freq: Once | INTRAMUSCULAR | Status: AC
  Administered 2023-12-18: 4 mg via INTRAVENOUS
  Filled 2023-12-18: qty 2

## 2023-12-18 MED ORDER — LACTATED RINGERS IV BOLUS
1000.0000 mL | Freq: Once | INTRAVENOUS | Status: AC
  Administered 2023-12-18: 1000 mL via INTRAVENOUS

## 2023-12-18 MED ORDER — MORPHINE SULFATE (PF) 4 MG/ML IV SOLN
4.0000 mg | Freq: Once | INTRAVENOUS | Status: AC
  Administered 2023-12-18: 4 mg via INTRAVENOUS
  Filled 2023-12-18: qty 1

## 2023-12-18 MED ORDER — MECLIZINE HCL 25 MG PO TABS: 25.0000 mg | ORAL_TABLET | Freq: Three times a day (TID) | ORAL | Status: DC | PRN

## 2023-12-18 MED ORDER — HEPARIN (PORCINE) 25000 UT/250ML-% IV SOLN
1100.0000 [IU]/h | INTRAVENOUS | Status: DC
  Administered 2023-12-18: 850 [IU]/h via INTRAVENOUS
  Administered 2023-12-19 – 2023-12-20 (×2): 1100 [IU]/h via INTRAVENOUS
  Filled 2023-12-18 (×3): qty 250

## 2023-12-18 MED ORDER — BUPROPION HCL ER (XL) 150 MG PO TB24
150.0000 mg | ORAL_TABLET | Freq: Every morning | ORAL | Status: DC
  Administered 2023-12-19 – 2023-12-22 (×4): 150 mg via ORAL
  Filled 2023-12-18 (×4): qty 1

## 2023-12-18 MED ORDER — HEPARIN SODIUM (PORCINE) 5000 UNIT/ML IJ SOLN
4000.0000 [IU] | Freq: Once | INTRAMUSCULAR | Status: AC
  Administered 2023-12-18: 4000 [IU] via INTRAVENOUS
  Filled 2023-12-18: qty 1

## 2023-12-18 MED ORDER — IOHEXOL 300 MG/ML  SOLN
100.0000 mL | Freq: Once | INTRAMUSCULAR | Status: AC | PRN
  Administered 2023-12-18: 100 mL via INTRAVENOUS

## 2023-12-18 MED ORDER — NITROGLYCERIN 0.4 MG SL SUBL: 0.4000 mg | SUBLINGUAL_TABLET | SUBLINGUAL | Status: DC | PRN

## 2023-12-18 MED ORDER — INSULIN ASPART 100 UNIT/ML IJ SOLN
0.0000 [IU] | Freq: Three times a day (TID) | INTRAMUSCULAR | Status: DC
  Administered 2023-12-19: 2 [IU] via SUBCUTANEOUS
  Administered 2023-12-19 – 2023-12-20 (×2): 1 [IU] via SUBCUTANEOUS
  Administered 2023-12-20 – 2023-12-22 (×2): 2 [IU] via SUBCUTANEOUS
  Administered 2023-12-22: 1 [IU] via SUBCUTANEOUS
  Filled 2023-12-18 (×6): qty 1

## 2023-12-18 MED ORDER — ASPIRIN 81 MG PO CHEW
324.0000 mg | CHEWABLE_TABLET | ORAL | Status: AC
  Administered 2023-12-18: 324 mg via ORAL
  Filled 2023-12-18: qty 4

## 2023-12-18 MED ORDER — IOHEXOL 350 MG/ML SOLN
75.0000 mL | Freq: Once | INTRAVENOUS | Status: AC | PRN
  Administered 2023-12-18: 75 mL via INTRAVENOUS

## 2023-12-18 MED ORDER — ACETAMINOPHEN 325 MG PO TABS
650.0000 mg | ORAL_TABLET | ORAL | Status: DC | PRN
  Administered 2023-12-20: 650 mg via ORAL
  Filled 2023-12-18: qty 2

## 2023-12-18 NOTE — ED Notes (Signed)
UA cup provided...

## 2023-12-18 NOTE — H&P (Signed)
 History and Physical   Alison Hernandez FMW:[id number] DOB: November 06, 1945 DOA: 12/18/2023  PCP: Alison Duwaine SQUIBB, DO   Patient coming from: Home  Chief Complaint: Back pain, nausea  HPI: Alison Hernandez is a 78 y.o. female with medical history significant of hypertension, hyperlipidemia, diabetes, anemia, depression presenting with back pain and nausea.  Patient began having episodes of right sided back pain a few days ago.  Subsequently developed associated episodes of nausea and vomiting, though no nausea today.  Denies fevers, chills, chest pain, shortness of breath, abdominal pain, constipation, diarrhea.  ED Course: Vital signs in the ED notable for blood pressure in the 120s-160s systolic.  Lab workup included CMP with sodium 131, glucose 153.  CBC with hemoglobin stable 11.2.  PT, PTT, INR normal.  Troponin trend 21, 393.  Lipase 61.  Chest x-ray showed no acute normality.    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  CTA PE study showed no PE and noted stable lung nodules.  Patient started on heparin  infusion in the ED, also received Zofran  and 1 L IV fluids.  Review of Systems: As per HPI otherwise all other systems reviewed and are negative.  Past Medical History:  Diagnosis Date   Arthritis    hands   Diabetes mellitus without complication (HCC)    GERD (gastroesophageal reflux disease)    Hyperlipidemia    Hypertension    TIA (transient ischemic attack)    no deficits   Vertigo     Past Surgical History:  Procedure Laterality Date   BREAST BIOPSY Left 20+ yrs ago   benign   BREAST BIOPSY Left 20+ yrs ago   benign   CATARACT EXTRACTION W/PHACO Right 05/07/2021   Procedure: CATARACT EXTRACTION PHACO AND INTRAOCULAR LENS PLACEMENT (IOC) RIGHT DIABETIC 19.49 02:17.9;   Surgeon: Jaye Fallow, MD;  Location[facility];  Service: Ophthalmology;  Laterality: Right;  Diabetic   CATARACT EXTRACTION W/PHACO Left 05/21/2021   Procedure: CATARACT EXTRACTION PHACO AND INTRAOCULAR LENS PLACEMENT (IOC) LEFT DIABETIC 18.57 01:42.9 ;  Surgeon: Alison Feliciano Hugger, MD;  Location: [facility];  Service: Ophthalmology;  Laterality: Left;  Diabetic   ROTATOR CUFF REPAIR Right 09/30/2016   SIGMOIDOSCOPY  1990   TOTAL ABDOMINAL HYSTERECTOMY     total    Social History  reports that she has never smoked. She has never used smokeless tobacco. She reports that she does not drink alcohol and does not use drugs.  Allergies  Allergen Reactions   Lovastatin  Nausea And Vomiting   Bydureon  [Exenatide ] Rash   Losartan Rash    Family History  Problem Relation Age of Onset   Diabetes Mother    Heart disease Mother    Cancer Father        esophagus/ lung   Cancer Sister        Ovarian   Diabetes Maternal Grandmother    Breast cancer Neg Hx   Reviewed on admission  Prior to Admission medications   Medication Sig Start Date End Date Taking? Authorizing Provider  acetaminophen  (TYLENOL ) 650 MG CR tablet Take 1,300 mg by mouth every 8 (eight) hours as needed for pain.   Yes [provider]  aspirin  EC 81 MG tablet Take 81 mg by mouth daily. Swallow whole.   Yes [provider]  buPROPion  (WELLBUTRIN  XL) 150 MG  24 hr tablet Take 1 tablet (150 mg total) by mouth every morning. 08/20/23  Yes Johnson, Megan P, DO  Dulaglutide  (TRULICITY ) 0.75 MG/0.5ML SOAJ Inject 0.75 mg into the skin once a week. Patient taking differently: Inject into the skin once a week. Waiting on meds from PAP 11/25/23  Yes Johnson, Megan P, DO  hydrochlorothiazide  (HYDRODIURIL ) 25 MG tablet Take 25 mg by mouth daily. 11/16/23  Yes [provider]  lisinopril  (ZESTRIL ) 40 MG tablet Take 1 tablet by mouth once daily 11/16/23  Yes Johnson, Megan P, DO  meclizine   (ANTIVERT ) 25 MG tablet Take 1 tablet (25 mg total) by mouth 3 (three) times daily as needed for dizziness. 03/31/23  Yes Johnson, Megan P, DO  metFORMIN  (GLUCOPHAGE ) 500 MG tablet Take 500 mg by mouth 2 (two) times daily with a meal.   Yes [provider]  nystatin  (NYAMYC ) powder APPLY TOPICALLY FOUR TIMES A DAY AS DIRECTED Patient taking differently: APPLY TOPICALLY FOUR TIMES A DAY AS DIRECTED PRN 01/16/23  Yes Johnson, Megan P, DO  omeprazole  (PRILOSEC) 20 MG capsule Take 1 capsule (20 mg total) by mouth 2 (two) times daily before a meal. 08/20/23  Yes Johnson, Megan P, DO  ondansetron  (ZOFRAN -ODT) 8 MG disintegrating tablet PLACE 1 TABLET ON TONGUE AND ALLOW TO DISSOLVE EVERY 8 HOURS AS NEEDED FOR NAUSEA & VOMITING 06/18/23  Yes Johnson, Megan P, DO  rosuvastatin  (CRESTOR ) 10 MG tablet Take 1 tablet (10 mg total) by mouth daily. 08/20/23  Yes Johnson, Megan P, DO  alendronate  (FOSAMAX ) 70 MG tablet Take 1 tablet (70 mg total) by mouth every 7 (seven) days. Take with a full glass of water on an empty stomach. Patient not taking: Reported on 12/18/2023 07/22/22   Alison Hernandez P, DO  Blood Glucose Monitoring Suppl (ONE TOUCH ULTRA 2) w/Device KIT TEST THREE TIMES A DAY 12/18/22   Johnson, Megan P, DO  diclofenac  Sodium (VOLTAREN ) 1 % GEL APPLY 4 GRAMS TOPICALLY FOUR TIMES A DAY AS DIRECTED Patient not taking: No sig reported 01/16/23   Alison Hernandez P, DO  Dulaglutide  (TRULICITY ) 1.5 MG/0.5ML SOAJ Inject 1.5 mg into the skin once a week for 28 days. 12/01/23 12/29/23  Johnson, Megan P, DO  Dulaglutide  (TRULICITY ) 3 MG/0.5ML SOAJ Inject 3 mg as directed once a week. 12/01/23   Johnson, Megan P, DO  Dulaglutide  (TRULICITY ) 4.5 MG/0.5ML SOAJ Inject 4.5 mg as directed once a week. 12/01/23   Alison Hernandez P, DO  FEROSUL 325 (65 Fe) MG tablet Take 1 tablet by mouth once daily Patient not taking: Reported on 12/18/2023 12/16/23   Alison Hernandez P, DO  glucose blood (ONETOUCH ULTRA) test strip CHECK  GLUCOSE ONCE DAILY 12/01/21   Alison Hernandez P, DO  glucose blood test strip Use as instructed Patient not taking: Reported on 12/03/2023 12/01/21   Alison Hernandez P, DO  glucose blood test strip Use as instructed 10/06/23   Alison, Hernandez P, DO  Lancets (ONETOUCH DELICA PLUS [place]) MISC USE AS DIRECTED THREE TIMES A DAY 12/31/22   Johnson, Megan P, DO  metFORMIN  (GLUCOPHAGE ) 1000 MG tablet Take 1 tablet (1,000 mg total) by mouth 2 (two) times daily with a meal. Patient not taking: Reported on 12/18/2023 08/20/23   Alison Hernandez P, DO  Multiple Vitamin (MULTIVITAMIN) tablet Take 1 tablet by mouth daily. Patient not taking: Reported on 12/18/2023    [provider]    Physical Exam: Vitals:   12/18/23 0930 12/18/23 1000 12/18/23 1332 12/18/23 1700  BP: 133/64 (!) 141/69 (!) 143/86 124/61  Pulse: 73 74 78 65  Resp: 15 20 20  (!) 26  Temp:   98 F (36.7 C)   TempSrc:   Oral   SpO2: 100% 97% 100% 97%  Weight:      Height:        Physical Exam Constitutional:      General: She is not in acute distress.    Appearance: Normal appearance.  HENT:     Head: Normocephalic and atraumatic.     Mouth/Throat:     Mouth: Mucous membranes are moist.     Pharynx: Oropharynx is clear.  Eyes:     Extraocular Movements: Extraocular movements intact.     Pupils: Pupils are equal, round, and reactive to light.  Cardiovascular:     Rate and Rhythm: Normal rate and regular rhythm.     Pulses: Normal pulses.     Heart sounds: Normal heart sounds.  Pulmonary:     Effort: Pulmonary effort is normal. No respiratory distress.     Breath sounds: Normal breath sounds.  Abdominal:     General: Bowel sounds are normal. There is no distension.     Palpations: Abdomen is soft.     Tenderness: There is no abdominal tenderness.  Musculoskeletal:        General: Tenderness (Right mid-back) present. No swelling or deformity.  Skin:    General: Skin is warm and dry.  Neurological:     General: No  focal deficit present.     Mental Status: Mental status is at baseline.    Labs on Admission: I have personally reviewed following labs and imaging studies  CBC: Recent Labs  Lab 12/18/23 0916  WBC 6.3  HGB 11.2*  HCT 32.3*  MCV 84.8  PLT 213    Basic Metabolic Panel: Recent Labs  Lab 12/18/23 0916  NA 131*  K 4.2  CL 99  CO2 22  GLUCOSE 153*  BUN 20  CREATININE 0.94  CALCIUM  9.2    GFR: Estimated Creatinine Clearance: 47.7 mL/min (by C-G formula based on SCr of 0.94 mg/dL).  Liver Function Tests: Recent Labs  Lab 12/18/23 0916  AST 14*  ALT 10  ALKPHOS 70  BILITOT 0.7  PROT 6.8  ALBUMIN 3.8    Urine analysis:    Component Value Date/Time   COLORURINE YELLOW (A) 12/18/2023 0917   APPEARANCEUR CLEAR (A) 12/18/2023 0917   APPEARANCEUR Clear 04/23/2022 1012   LABSPEC 1.026 12/18/2023 0917   PHURINE 6.0 12/18/2023 0917   GLUCOSEU NEGATIVE 12/18/2023 0917   HGBUR NEGATIVE 12/18/2023 0917   BILIRUBINUR NEGATIVE 12/18/2023 0917   BILIRUBINUR Negative 04/23/2022 1012   KETONESUR NEGATIVE 12/18/2023 0917   PROTEINUR NEGATIVE 12/18/2023 0917   NITRITE NEGATIVE 12/18/2023 0917   LEUKOCYTESUR SMALL (A) 12/18/2023 0917    Radiological Exams on Admission: CT Angio Chest PE W and/or Wo Contrast Result Date: 12/18/2023 CLINICAL DATA:  Evaluate pulmonary embolus. Right back pain. Elevated trops. Dizziness and nausea with pain in the upper back radiating to the both shoulders. EXAM: CT ANGIOGRAPHY CHEST WITH CONTRAST TECHNIQUE: Multidetector CT imaging of the chest was performed using the standard protocol during bolus administration of intravenous contrast. Multiplanar CT image reconstructions and MIPs were obtained to evaluate the vascular anatomy. RADIATION DOSE REDUCTION: This exam was performed according to the departmental dose-optimization program which includes automated exposure control, adjustment of the mA and/or kV according to patient size and/or use of  iterative reconstruction technique. CONTRAST:  75mL OMNIPAQUE  IOHEXOL  350 MG/ML SOLN COMPARISON:  Chest radiograph 12/18/2023. CT chest abdomen and pelvis 10/06/2023 FINDINGS: Cardiovascular: Good opacification of the central and segmental pulmonary arteries. No focal filling defects. No evidence of significant pulmonary embolus. Normal heart size. No pericardial effusions. Normal caliber thoracic aorta. Calcification in the aorta and coronary arteries. Mediastinum/Nodes: No enlarged mediastinal, hilar, or axillary lymph nodes. Thyroid  gland, trachea, and esophagus demonstrate no significant findings. Lungs/Pleura: 5 mm nodule in the right upper lung, series 6, image 45. 3 mm nodule in the left upper lung, image 40. No change since previous studies. Mild dependent atelectasis in the lungs. No pleural effusion or pneumothorax. Upper Abdomen: No acute abnormalities. Musculoskeletal: Degenerative changes in the spine. Focal sclerosis in the mid sternum. Etiology is nonspecific. This could be healing fracture or developing mass lesion. Consider bone scan correlation. Healing left rib fracture. Review of the MIP images confirms the above findings. IMPRESSION: 1. No evidence of significant pulmonary embolus. 2. Bilateral pulmonary nodules, largest measuring 5 mm. No change since prior study. No follow-up needed if patient is low-risk.This recommendation follows the consensus statement: Guidelines for Management of Incidental Pulmonary Nodules Detected on CT Images: From the Fleischner Society 2017; Radiology 2017; 284:228-243. 3. Focal sclerosis demonstrated in the mid sternum with nonspecific etiology. Consider bone scan correlation. Healing left rib fracture. Electronically Signed   By: Elsie Gravely M.D.   On: 12/18/2023 16:02   CT ABDOMEN PELVIS W CONTRAST Result Date: 12/18/2023 EXAM: CT ABDOMEN AND PELVIS WITH CONTRAST 12/18/2023 10:27:07 AM TECHNIQUE: CT of the abdomen and pelvis was performed with the  administration of 100 mL of iohexol  (OMNIPAQUE ) 300 MG/ML solution. Multiplanar reformatted images are provided for review. Automated exposure control, iterative reconstruction, and/or weight-based adjustment of the mA/kV was utilized to reduce the radiation dose to as low as reasonably achievable. COMPARISON: 10/06/2023 CLINICAL HISTORY: Right CVA pain and TTP, emesis. Dizziness, nausea, and upper back pain radiating to shoulders. Ongoing since Tuesday. FINDINGS: LOWER CHEST: Healing fractures of the left ninth, tenth, and 11th ribs laterally. Descending thoracic aortic atheromatous vascular calcification. LIVER: The liver is unremarkable. GALLBLADDER AND BILE DUCTS: Gallbladder is unremarkable. No biliary ductal dilatation. SPLEEN: No acute abnormality. PANCREAS: Small cystic lesions along the pancreas, largest measuring 1.1 x 1.1 x 1.1 cm, unchanged. Based on patient age and lesion size, follow up pancreatic protocol MRI or CT is recommended in 2 years' time. ADRENAL GLANDS: No acute abnormality. KIDNEYS, URETERS AND BLADDER: Small simple bilateral renal cysts warrant no further imaging workup. Stable mild scarring in the right mid kidney laterally not substantially changed from previous. No stones in the kidneys or ureters. No hydronephrosis. No perinephric or periureteral stranding. Urinary bladder is unremarkable. GI AND BOWEL: Periampullary duodenal diverticulum. Sigmoid colon diverticulosis. Stomach demonstrates no acute abnormality. There is no bowel obstruction. PERITONEUM AND RETROPERITONEUM: No ascites. No free air. VASCULATURE: Systemic atherosclerosis is present, including the aorta and iliac arteries. Aorta is normal in caliber. LYMPH NODES: No lymphadenopathy. REPRODUCTIVE ORGANS: Presumed right ovary contains a 1.8 x 1.2 cm simple-appearing cyst. The right ovary parenchyma enhances more than is typically encountered, almost resembling renal enhancement ; pelvic sonography recommended for more  definitive characterization. Prior hysterectomy. BONES AND SOFT TISSUES: Healing fractures of the left ninth, tenth, and 11th ribs laterally. Moderate degenerative hip arthropathy, right greater than left. Small right groin hernia contains adipose tissue. IMPRESSION: 1. No acute abnormalities. 2. Moderate degenerative hip arthropathy, right greater than left. 3. Atherosclerosis, including aortic and iliac  arteries. 4. 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 5. Simple right ovarian cyst measuring 1.8 x 1.2 cm, with unusual enhancement of the right ovary ; recommend pelvic sonography. 6. Small simple bilateral renal cysts. 7. Periampullary duodenal diverticulum and sigmoid diverticulosis. 8. Small right groin fat-containing hernia. 9. Healing fractures of the left ninth, tenth, and 11th ribs. Electronically signed by: Ryan Salvage MD 12/18/2023 11:07 AM EDT RP Workstation: [id number]   DG Chest 2 View Result Date: 12/18/2023 EXAM: 2 VIEW(S) XRAY OF THE CHEST 12/18/2023 10:16:00 AM COMPARISON: 10/06/2023 CLINICAL HISTORY: right sided thoracic back pain. Patient to ED via POV for dizziness and nausea with pain in upper back that radiates into bilateral shoulders. Ongoing since Tuesday. FINDINGS: LUNGS AND PLEURA: No focal pulmonary opacity. No pulmonary edema. No pleural effusion. No pneumothorax. HEART AND MEDIASTINUM: Mild-to-moderate atherosclerotic calcifications in aortic arch. No acute abnormality of the cardiac and mediastinal silhouettes. BONES AND SOFT TISSUES: Mild multilevel degenerative disc changes of thoracic spine. No acute osseous abnormality. IMPRESSION: 1. No acute cardiopulmonary abnormality detected. 2. Atherosclerotic calcifications in the aortic arch. Electronically signed by: Waddell Calk MD 12/18/2023 10:30 AM EDT RP Workstation: [id number]   EKG: Independently reviewed. Sinus rhythm at 72 beats minute.  Left branch block.   Nonspecific T wave changes.  Similar to previous.  Assessment/Plan Active Problems:   Diabetes (HCC)   Benign hypertensive renal disease   Hyperlipidemia   Depression   Anemia   NSTEMI > Atypical presentation with back pain and nausea vomiting.  Workup otherwise unrevealing. > Troponin elevated to 21, 393.  Will continue to trend. > CTA PE study was negative for PE or other acute abnormality.  Otherwise showed stable lung nodules. > Started on heparin  infusion in the ED. - Monitor on telemetry overnight - Cardiology consult, will see a.m. 9/27 - Continue with heparin  infusion - Continue to trend troponin - Echocardiogram - Check lipid panel - A1c recently checked - Continue ASA, lisinopril , rosuvastatin   Hypertension - Continue lisinopril , hydrochlorothiazide   Hyperlipidemia - Continue home rosuvastatin   Diabetes - SSI  Anemia - Hemoglobin stable at 0.2, trend CBC  Depression - Continue Wellbutrin   Abnormal CT findings > CT abdomen pelvis noted pancreatic cysts recommending 2-year follow-up. > CT abdomen pelvis also noted ovarian enhancement abnormality recommending pelvic ultrasound follow-up. > Stable nodules on chest CT   DVT prophylaxis: Heparin  Code Status:   Full Family Communication:  Updated at bedside  Disposition Plan:   Patient is from:  Home  Anticipated DC to:  Home  Anticipated DC date:  1 to 3 days  Anticipated DC barriers: None  Consults called:  Cardiology Admission status:  Observation, telemetry  Severity of Illness: The appropriate patient status for this patient is OBSERVATION. Observation status is judged to be reasonable and necessary in order to provide the required intensity of service to ensure the patient's safety. The patient's presenting symptoms, physical exam findings, and initial radiographic and laboratory data in the context of their medical condition is felt to place them at decreased risk for further clinical deterioration.  Furthermore, it is anticipated that the patient will be medically stable for discharge from the hospital within 2 midnights of admission.    Marsa KATHEE Scurry MD [facility]  How to contact the [facility] Attending or Consulting provider 7A - 7P or covering provider during after hours 7P -7A, for this patient?   Check the care team in [facility] and look for  a) attending/consulting [facility] provider listed and b) the [facility] team listed Log into [email or web address] and use [place]'s universal password to access. If you do not have the password, please contact the hospital operator. Locate the [facility] provider you are looking for under [facility] and page to a number that you can be directly reached. If you still have difficulty reaching the provider, please page the [facility] (Director on Call) for the Hospitalists listed on amion for assistance.  12/18/2023, 6:05 PM

## 2023-12-18 NOTE — ED Triage Notes (Signed)
 Patient to ED via POV for dizziness and nausea with pain in upper back that radiates into bilateral shoulders. Ongoing since Tuesday.

## 2023-12-18 NOTE — Consult Note (Signed)
 PHARMACY - ANTICOAGULATION CONSULT NOTE  Pharmacy Consult for Heparin  Indication: NSTEMI  Allergies  Allergen Reactions   Lovastatin  Nausea And Vomiting   Bydureon  [Exenatide ] Rash   Losartan Rash    Patient Measurements: Height: 5' 6 (167.6 cm) Weight: 67.1 kg (148 lb) IBW/kg (Calculated) : 59.3 HEPARIN  DW (KG): 67.1  Vital Signs: Temp: 98 F (36.7 C) (09/26 1332) Temp Source: Oral (09/26 1332) BP: 143/86 (09/26 1332) Pulse Rate: 78 (09/26 1332)  Labs: Recent Labs    12/18/23 0916 12/18/23 1347  HGB 11.2*  --   HCT 32.3*  --   PLT 213  --   CREATININE 0.94  --   TROPONINIHS 21* 393*    Estimated Creatinine Clearance: 47.7 mL/min (by C-G formula based on SCr of 0.94 mg/dL).   Medical History: Past Medical History:  Diagnosis Date   Arthritis    hands   Diabetes mellitus without complication (HCC)    GERD (gastroesophageal reflux disease)    Hyperlipidemia    Hypertension    TIA (transient ischemic attack)    no deficits   Vertigo     Medications:  No anticoagulants on file.   Assessment: 77 YOF presents to the ED w/ NSTEMI. Pharmacy consulted to dose continuous heparin  infusion.  Baseline labs: INR and aPTT pending, Hgb: 11.2 , Plts: 213  Goal of Therapy:  Heparin  level 0.3-0.7 units/ml Monitor platelets by anticoagulation protocol: Yes   Plan:  Give 4000 units bolus x 1 Start heparin  infusion at 850 units/hr Check anti-Xa level in 8 hours and daily while on heparin  Continue to monitor H&H and platelets  Deleta Colt PharmD Candidate 2026 12/18/2023,3:20 PM

## 2023-12-18 NOTE — Telephone Encounter (Signed)
 FYI Only or Action Required?: FYI only for provider.  Patient was last seen in primary care on 11/25/2023 by Vicci Bouchard P, DO.  Called Nurse Triage reporting Back Pain.  Symptoms began several days ago.  Interventions attempted: Nothing.  Symptoms are: rapidly worsening.  Triage Disposition: Go to ED Now (or PCP Triage)  Patient/caregiver understands and will follow disposition?: Yes Pt reports mid back pain that radiates to shoulder x 3 days. Says she started back to work and is on her feet all day.  Pt also endorse dizziness with nausea and vomiting x 2 days. The dizziness is worse when changing positions.  Pt currently dizzy while on call with this RN, pt says that her daughter is with her for support. Pt reports that she feels light headed and is unable to to walk.  RN advising ED. Pt is agreeable, daughter to transporty  Copied from CRM [id number]. Topic: Clinical - Red Word Triage >> Dec 18, 2023  8:02 AM [name] wrote: Red Word that prompted transfer to Nurse Triage: sharp pain in back that's traveling to her shoulders and every time she gets up she's nauseous and very dizzy Reason for Disposition  SEVERE dizziness (e.g., unable to stand, requires support to walk, feels like passing out now)  Answer Assessment - Initial Assessment Questions 1. ONSET: When did the pain begin? (e.g., minutes, hours, days)     *No Answer* 2. LOCATION: Where does it hurt? (upper, mid or lower back)     Mid back and radiates upward  3. SEVERITY: How bad is the pain?  (e.g., Scale 1-10; mild, moderate, or severe)     10/10 pain  4. PATTERN: Is the pain constant? (e.g., yes, no; constant, intermittent)      Constant  5. RADIATION: Does the pain shoot into your legs or somewhere else?     Radiates up into shoulder  6. CAUSE:  What do you think is causing the back pain?      Unsure of cause  7. BACK OVERUSE:  Any recent lifting of heavy objects, strenuous work or exercise?      Started back to work on Monday, but wasn't heavy lifting. Was on feet all day  8. MEDICINES: What have you taken so far for the pain? (e.g., nothing, acetaminophen , NSAIDS)     . 9. NEUROLOGIC SYMPTOMS: Do you have any weakness, numbness, or problems with bowel/bladder control?     *No Answer* 10. OTHER SYMPTOMS: Do you have any other symptoms? (e.g., fever, abdomen pain, burning with urination, blood in urine)        11. PREGNANCY: Is there any chance you are pregnant? When was your last menstrual period?       *No Answer*  Answer Assessment - Initial Assessment Questions 1. DESCRIPTION: Describe your dizziness.     Gets dizzy when standing or moving position  2. LIGHTHEADED: Do you feel lightheaded? (e.g., somewhat faint, woozy, weak upon standing)     Weak upon standing and somewhat faint  3. VERTIGO: Do you feel like either you or the room is spinning or tilting? (i.e., vertigo)     The room is spinning severely  4. SEVERITY: How bad is it?  Do you feel like you are going to faint? Can you stand and walk?     Feels like she is going to faint, uinable to stand and walk  5. ONSET:  When did the dizziness begin?     2 days ago  6.  AGGRAVATING FACTORS: Does anything make it worse? (e.g., standing, change in head position)     Unable to determine, but standing or changing head position does make dizziness worse  7. HEART RATE: Can you tell me your heart rate? How many beats in 15 seconds?  (Note: Not all patients can do this.)       Unable to tell  8. CAUSE: What do you think is causing the dizziness? (e.g., decreased fluids or food, diarrhea, emotional distress, heat exposure, new medicine, sudden standing, vomiting; unknown)     Unsure of cause   9. RECURRENT SYMPTOM: Have you had dizziness before? If Yes, ask: When was the last time? What happened that time?     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 Not currently taking any medication   10. OTHER  SYMPTOMS: Do you have any other symptoms? (e.g., fever, chest pain, vomiting, diarrhea, bleeding)       Nausea and vomiting  11. PREGNANCY: Is there any chance you are pregnant? When was your last menstrual period?       No  Protocols used: Back Pain-A-AH, Dizziness - Lightheadedness-A-AH

## 2023-12-18 NOTE — ED Provider Notes (Signed)
 [facility] Provider Note    Event Date/Time   First MD Initiated Contact with Patient 12/18/23 [phone]     (approximate)   History   Dizziness   HPI  Alison Hernandez is a 78 y.o. female who presents to the ED for evaluation of Dizziness   I review a PCP visit from 9/3.History of DM, GERD, HTN and HLD.  Patient presents for evaluation of right sided mid back pain, nausea and emesis.  Notably she is triaged as having dizziness but she denies this with me and reports primarily pain with multiple episodes of emesis with the past 3 days.  Symptoms started with pain, atraumatic, reports developing nausea and emesis later.  1-4 episodes of nonbloody nonbilious emesis per day over the past 3 days with the pain.  No other associated symptoms.  She reports chronic urinary frequency that she attributes to her diabetes that does not seem to have changed.  No stool changes, chest discomfort, shortness of breath or cough.   Physical Exam   Triage Vital Signs: ED Triage Vitals  Encounter Vitals Group     BP 12/18/23 0903 (!) 169/81     Girls Systolic BP Percentile --      Girls Diastolic BP Percentile --      Boys Systolic BP Percentile --      Boys Diastolic BP Percentile --      Pulse Rate 12/18/23 0903 90     Resp 12/18/23 0903 17     Temp 12/18/23 0903 97.9 F (36.6 C)     Temp Source 12/18/23 0903 Oral     SpO2 12/18/23 0903 96 %     Weight 12/18/23 0902 148 lb (67.1 kg)     Height 12/18/23 0902 5' 6 (1.676 m)     Head Circumference --      Peak Flow --      Pain Score 12/18/23 0901 10     Pain Loc --      Pain Education --      Exclude from Growth Chart --     Most recent vital signs: Vitals:   12/18/23 1000 12/18/23 1332  BP: (!) 141/69 (!) 143/86  Pulse: 74 78  Resp: 20 20  Temp:  98 F (36.7 C)  SpO2: 97% 100%    General: Awake, no distress.  CV:  Good peripheral perfusion.  Resp:  Normal effort.  Abd:  No distention.  No anterior  pain or tenderness throughout the abdomen MSK:  No deformity noted.  Can fully range the right lower extremity without pain Neuro:  No focal deficits appreciated. Other:  Grimacing and clearly uncomfortable when sitting upright to examine her back, tolerates laying on her left side to examine her back slightly better.  Area of pain is over her right CVA, thoracolumbar right-sided paraspinal back.  No skin changes, rash, bruising or redness.  Some tenderness with deeper palpation   ED Results / Procedures / Treatments   Labs (all labs ordered are listed, but only abnormal results are displayed) Labs Reviewed  COMPREHENSIVE METABOLIC PANEL WITH GFR - Abnormal; Notable for the following components:      Result Value   Sodium 131 (*)    Glucose, Bld 153 (*)    AST 14 (*)    All other components within normal limits  CBC - Abnormal; Notable for the following components:   RBC 3.81 (*)    Hemoglobin 11.2 (*)    HCT 32.3 (*)  All other components within normal limits  URINALYSIS, ROUTINE W REFLEX MICROSCOPIC - Abnormal; Notable for the following components:   Color, Urine YELLOW (*)    APPearance CLEAR (*)    Leukocytes,Ua SMALL (*)    All other components within normal limits  LIPASE, BLOOD - Abnormal; Notable for the following components:   Lipase 61 (*)    All other components within normal limits  CBG MONITORING, ED - Abnormal; Notable for the following components:   Glucose-Capillary 154 (*)    All other components within normal limits  TROPONIN I (HIGH SENSITIVITY) - Abnormal; Notable for the following components:   Troponin I (High Sensitivity) 21 (*)    All other components within normal limits  TROPONIN I (HIGH SENSITIVITY) - Abnormal; Notable for the following components:   Troponin I (High Sensitivity) 393 (*)    All other components within normal limits  APTT  PROTIME-INR    EKG Sinus rhythm with a rate of 86 bpm.  Left bundle morphology without clear signs of  acute ischemia.  Nonspecific ST changes laterally and inferiorly.  Similar morphology as comparison from July  Repeat EKG with a sinus rhythm, rate of 72 bpm, left bundle morphology, biphasic T waves to anterior leads.  No STEMI  RADIOLOGY CXR interpreted by me without evidence of acute cardiopulmonary pathology. CT abdomen/pelvis interpreted by me without evidence of acute pathology  Official radiology report(s): CT ABDOMEN PELVIS W CONTRAST Result Date: 12/18/2023 EXAM: CT ABDOMEN AND PELVIS WITH CONTRAST 12/18/2023 10:27:07 AM TECHNIQUE: CT of the abdomen and pelvis was performed with the administration of 100 mL of iohexol  (OMNIPAQUE ) 300 MG/ML solution. Multiplanar reformatted images are provided for review. Automated exposure control, iterative reconstruction, and/or weight-based adjustment of the mA/kV was utilized to reduce the radiation dose to as low as reasonably achievable. COMPARISON: 10/06/2023 CLINICAL HISTORY: Right CVA pain and TTP, emesis. Dizziness, nausea, and upper back pain radiating to shoulders. Ongoing since Tuesday. FINDINGS: LOWER CHEST: Healing fractures of the left ninth, tenth, and 11th ribs laterally. Descending thoracic aortic atheromatous vascular calcification. LIVER: The liver is unremarkable. GALLBLADDER AND BILE DUCTS: Gallbladder is unremarkable. No biliary ductal dilatation. SPLEEN: No acute abnormality. PANCREAS: Small cystic lesions along the pancreas, largest measuring 1.1 x 1.1 x 1.1 cm, unchanged. Based on patient age and lesion size, follow up pancreatic protocol MRI or CT is recommended in 2 years' time. ADRENAL GLANDS: No acute abnormality. KIDNEYS, URETERS AND BLADDER: Small simple bilateral renal cysts warrant no further imaging workup. Stable mild scarring in the right mid kidney laterally not substantially changed from previous. No stones in the kidneys or ureters. No hydronephrosis. No perinephric or periureteral stranding. Urinary bladder is  unremarkable. GI AND BOWEL: Periampullary duodenal diverticulum. Sigmoid colon diverticulosis. Stomach demonstrates no acute abnormality. There is no bowel obstruction. PERITONEUM AND RETROPERITONEUM: No ascites. No free air. VASCULATURE: Systemic atherosclerosis is present, including the aorta and iliac arteries. Aorta is normal in caliber. LYMPH NODES: No lymphadenopathy. REPRODUCTIVE ORGANS: Presumed right ovary contains a 1.8 x 1.2 cm simple-appearing cyst. The right ovary parenchyma enhances more than is typically encountered, almost resembling renal enhancement ; pelvic sonography recommended for more definitive characterization. Prior hysterectomy. BONES AND SOFT TISSUES: Healing fractures of the left ninth, tenth, and 11th ribs laterally. Moderate degenerative hip arthropathy, right greater than left. Small right groin hernia contains adipose tissue. IMPRESSION: 1. No acute abnormalities. 2. Moderate degenerative hip arthropathy, right greater than left. 3. Atherosclerosis, including aortic and iliac arteries. 4. Small  pancreatic cysts up to 1.1 cm, unchanged; follow-up MRI or CT in 2 years recommended. 5. Simple right ovarian cyst measuring 1.8 x 1.2 cm, with unusual enhancement of the right ovary ; recommend pelvic sonography. 6. Small simple bilateral renal cysts. 7. Periampullary duodenal diverticulum and sigmoid diverticulosis. 8. Small right groin fat-containing hernia. 9. Healing fractures of the left ninth, tenth, and 11th ribs. Electronically signed by: Ryan Salvage MD 12/18/2023 11:07 AM EDT RP Workstation: [id number]   DG Chest 2 View Result Date: 12/18/2023 EXAM: 2 VIEW(S) XRAY OF THE CHEST 12/18/2023 10:16:00 AM COMPARISON: 10/06/2023 CLINICAL HISTORY: right sided thoracic back pain. Patient to ED via POV for dizziness and nausea with pain in upper back that radiates into bilateral shoulders. Ongoing since Tuesday. FINDINGS: LUNGS AND PLEURA: No focal pulmonary opacity. No pulmonary  edema. No pleural effusion. No pneumothorax. HEART AND MEDIASTINUM: Mild-to-moderate atherosclerotic calcifications in aortic arch. No acute abnormality of the cardiac and mediastinal silhouettes. BONES AND SOFT TISSUES: Mild multilevel degenerative disc changes of thoracic spine. No acute osseous abnormality. IMPRESSION: 1. No acute cardiopulmonary abnormality detected. 2. Atherosclerotic calcifications in the aortic arch. Electronically signed by: Waddell Calk MD 12/18/2023 10:30 AM EDT RP Workstation: [id number]    PROCEDURES and INTERVENTIONS:  .Critical Care  Performed by: Claudene Rover, MD Authorized by: Claudene Rover, MD   Critical care provider statement:    Critical care time (minutes):  30   Critical care time was exclusive of:  Separately billable procedures and treating other patients   Critical care was necessary to treat or prevent imminent or life-threatening deterioration of the following conditions:  Cardiac failure and circulatory failure   Critical care was time spent personally by me on the following activities:  Development of treatment plan with patient or surrogate, discussions with consultants, evaluation of patient's response to treatment, examination of patient, ordering and review of laboratory studies, ordering and review of radiographic studies, ordering and performing treatments and interventions, pulse oximetry, re-evaluation of patient's condition and review of old charts .1-3 Lead EKG Interpretation  Performed by: Claudene Rover, MD Authorized by: Claudene Rover, MD     Interpretation: normal     ECG rate:  76   ECG rate assessment: normal     Rhythm: sinus rhythm     Ectopy: none     Conduction: normal     Medications  lidocaine  (LIDODERM ) 5 % 2 patch (2 patches Transdermal Patch Applied 12/18/23 1123)  heparin  ADULT infusion 100 units/mL (25000 units/250mL) (has no administration in time range)  ondansetron  (ZOFRAN ) injection 4 mg (4 mg Intravenous Given  12/18/23 0953)  lactated ringers  bolus 1,000 mL (0 mLs Intravenous Stopped 12/18/23 1121)  morphine  (PF) 4 MG/ML injection 4 mg (4 mg Intravenous Given 12/18/23 0953)  iohexol  (OMNIPAQUE ) 300 MG/ML solution 100 mL (100 mLs Intravenous Contrast Given 12/18/23 1015)  heparin  injection 4,000 Units (4,000 Units Intravenous Given 12/18/23 1521)  iohexol  (OMNIPAQUE ) 350 MG/ML injection 75 mL (75 mLs Intravenous Contrast Given 12/18/23 1526)     IMPRESSION / MDM / ASSESSMENT AND PLAN / ED COURSE  I reviewed the triage vital signs and the nursing notes.  Differential diagnosis includes, but is not limited to, ACS, PE, pneumothorax, ureteral colic, muscular spasm, shingles  {Patient presents with symptoms of an acute illness or injury that is potentially life-threatening.  Patient presents with atraumatic right sided lower thoracic/upper lumbar pain.  Some localized tenderness without skin changes.  EKG with chronic left bundle.  CBC and metabolic panel unremarkable,  marginal elevation of lipase.  Urine with small leukocytes but she has no urinary symptoms, no bacteria and I doubt pyelonephritis.  CXR and CT abdomen/pelvis unrevealing.  Her first troponin is just mildly elevated and her pain resolves with lidocaine  patch administration to the area.  Repeat troponin bumped dramatically though.  Due to her/localized area of symptoms, suspicion for PE so will obtain CTA chest, addition to heparinization and plan for medical admission.  Clinical Course as of 12/18/23 1545  Fri Dec 18, 2023  0949 R CVA pain and TTP, emesis [DS]  1520 I updated patient on significant elevation of troponin and what this could represent.  We discussed high probability of either PE or NSTEMI.  She is going over to CT now for CTA chest [DS]    Clinical Course User Index [DS] Claudene Rover, MD     FINAL CLINICAL IMPRESSION(S) / ED DIAGNOSES   Final diagnoses:  None     Rx / DC Orders   ED Discharge Orders     None         Note:  This document was prepared using Dragon voice recognition software and may include unintentional dictation errors.   Claudene Rover, MD 12/18/23 [phone]

## 2023-12-19 ENCOUNTER — Other Ambulatory Visit: Payer: Self-pay | Admitting: Internal Medicine

## 2023-12-19 ENCOUNTER — Observation Stay: Admit: 2023-12-19 | Discharge: 2023-12-19 | Disposition: A | Attending: Internal Medicine

## 2023-12-19 DIAGNOSIS — Z723 Lack of physical exercise: Secondary | ICD-10-CM | POA: Diagnosis not present

## 2023-12-19 DIAGNOSIS — Z7984 Long term (current) use of oral hypoglycemic drugs: Secondary | ICD-10-CM | POA: Diagnosis not present

## 2023-12-19 DIAGNOSIS — I2510 Atherosclerotic heart disease of native coronary artery without angina pectoris: Secondary | ICD-10-CM | POA: Diagnosis not present

## 2023-12-19 DIAGNOSIS — K219 Gastro-esophageal reflux disease without esophagitis: Secondary | ICD-10-CM | POA: Diagnosis present

## 2023-12-19 DIAGNOSIS — Z79899 Other long term (current) drug therapy: Secondary | ICD-10-CM | POA: Diagnosis not present

## 2023-12-19 DIAGNOSIS — E785 Hyperlipidemia, unspecified: Secondary | ICD-10-CM | POA: Diagnosis not present

## 2023-12-19 DIAGNOSIS — Z5986 Financial insecurity: Secondary | ICD-10-CM | POA: Diagnosis not present

## 2023-12-19 DIAGNOSIS — K862 Cyst of pancreas: Secondary | ICD-10-CM | POA: Diagnosis present

## 2023-12-19 DIAGNOSIS — I130 Hypertensive heart and chronic kidney disease with heart failure and stage 1 through stage 4 chronic kidney disease, or unspecified chronic kidney disease: Secondary | ICD-10-CM | POA: Diagnosis present

## 2023-12-19 DIAGNOSIS — I214 Non-ST elevation (NSTEMI) myocardial infarction: Secondary | ICD-10-CM | POA: Diagnosis not present

## 2023-12-19 DIAGNOSIS — Z961 Presence of intraocular lens: Secondary | ICD-10-CM | POA: Diagnosis present

## 2023-12-19 DIAGNOSIS — D638 Anemia in other chronic diseases classified elsewhere: Secondary | ICD-10-CM | POA: Diagnosis present

## 2023-12-19 DIAGNOSIS — Z7982 Long term (current) use of aspirin: Secondary | ICD-10-CM | POA: Diagnosis not present

## 2023-12-19 DIAGNOSIS — I10 Essential (primary) hypertension: Secondary | ICD-10-CM | POA: Diagnosis not present

## 2023-12-19 DIAGNOSIS — Z7985 Long-term (current) use of injectable non-insulin antidiabetic drugs: Secondary | ICD-10-CM | POA: Diagnosis not present

## 2023-12-19 DIAGNOSIS — Z7983 Long term (current) use of bisphosphonates: Secondary | ICD-10-CM | POA: Diagnosis not present

## 2023-12-19 DIAGNOSIS — Z8673 Personal history of transient ischemic attack (TIA), and cerebral infarction without residual deficits: Secondary | ICD-10-CM | POA: Diagnosis not present

## 2023-12-19 DIAGNOSIS — I5043 Acute on chronic combined systolic (congestive) and diastolic (congestive) heart failure: Secondary | ICD-10-CM | POA: Diagnosis present

## 2023-12-19 DIAGNOSIS — Z9842 Cataract extraction status, left eye: Secondary | ICD-10-CM | POA: Diagnosis not present

## 2023-12-19 DIAGNOSIS — F32A Depression, unspecified: Secondary | ICD-10-CM | POA: Diagnosis present

## 2023-12-19 DIAGNOSIS — E119 Type 2 diabetes mellitus without complications: Secondary | ICD-10-CM | POA: Diagnosis not present

## 2023-12-19 DIAGNOSIS — Z8249 Family history of ischemic heart disease and other diseases of the circulatory system: Secondary | ICD-10-CM | POA: Diagnosis not present

## 2023-12-19 DIAGNOSIS — E1122 Type 2 diabetes mellitus with diabetic chronic kidney disease: Secondary | ICD-10-CM | POA: Diagnosis present

## 2023-12-19 DIAGNOSIS — I447 Left bundle-branch block, unspecified: Secondary | ICD-10-CM | POA: Diagnosis present

## 2023-12-19 DIAGNOSIS — Z833 Family history of diabetes mellitus: Secondary | ICD-10-CM | POA: Diagnosis not present

## 2023-12-19 DIAGNOSIS — R42 Dizziness and giddiness: Secondary | ICD-10-CM

## 2023-12-19 DIAGNOSIS — E222 Syndrome of inappropriate secretion of antidiuretic hormone: Secondary | ICD-10-CM | POA: Diagnosis present

## 2023-12-19 LAB — TROPONIN I (HIGH SENSITIVITY): Troponin I (High Sensitivity): 567 ng/L (ref <18)

## 2023-12-19 LAB — GLUCOSE, CAPILLARY
Glucose-Capillary: 137 mg/dL — ABNORMAL HIGH (ref 70–99)
Glucose-Capillary: 107 mg/dL — ABNORMAL HIGH (ref 70–99)
Glucose-Capillary: 155 mg/dL — ABNORMAL HIGH (ref 70–99)
Glucose-Capillary: 196 mg/dL — ABNORMAL HIGH (ref 70–99)

## 2023-12-19 LAB — ECHOCARDIOGRAM COMPLETE
AR max vel: 2.58 cm2
AV Peak grad: 3 mmHg
Ao pk vel: 0.87 m/s
Area-P 1/2: 4.89 cm2
Calc EF: 32.6 %
Height: 66 in
S' Lateral: 4.4 cm
Single Plane A2C EF: 27.1 %
Single Plane A4C EF: 35.1 %
Weight: 2338.64 [oz_av]

## 2023-12-19 LAB — COMPREHENSIVE METABOLIC PANEL WITH GFR
ALT: 8 U/L (ref 0–44)
AST: 14 U/L — ABNORMAL LOW (ref 15–41)
Albumin: 3.6 g/dL (ref 3.5–5.0)
Alkaline Phosphatase: 63 U/L (ref 38–126)
Anion gap: 9 (ref 5–15)
BUN: 16 mg/dL (ref 8–23)
CO2: 23 mmol/L (ref 22–32)
Calcium: 8.9 mg/dL (ref 8.9–10.3)
Chloride: 97 mmol/L — ABNORMAL LOW (ref 98–111)
Creatinine, Ser: 1.1 mg/dL — ABNORMAL HIGH (ref 0.44–1.00)
GFR, Estimated: 52 mL/min — ABNORMAL LOW (ref >60)
Glucose, Bld: 129 mg/dL — ABNORMAL HIGH (ref 70–99)
Potassium: 3.9 mmol/L (ref 3.5–5.1)
Sodium: 129 mmol/L — ABNORMAL LOW (ref 135–145)
Total Bilirubin: 0.8 mg/dL (ref 0.0–1.2)
Total Protein: 6.3 g/dL — ABNORMAL LOW (ref 6.5–8.1)

## 2023-12-19 LAB — CBC
HCT: 28.7 % — ABNORMAL LOW (ref 36.0–46.0)
Hemoglobin: 10.1 g/dL — ABNORMAL LOW (ref 12.0–15.0)
MCH: 29.8 pg (ref 26.0–34.0)
MCHC: 35.2 g/dL (ref 30.0–36.0)
MCV: 84.7 fL (ref 80.0–100.0)
Platelets: 169 K/uL (ref 150–400)
RBC: 3.39 MIL/uL — ABNORMAL LOW (ref 3.87–5.11)
RDW: 12.7 % (ref 11.5–15.5)
WBC: 4.6 K/uL (ref 4.0–10.5)
nRBC: 0 % (ref 0.0–0.2)

## 2023-12-19 LAB — LIPID PANEL
Cholesterol: 130 mg/dL (ref 0–200)
HDL: 59 mg/dL (ref >40)
LDL Cholesterol: 55 mg/dL (ref 0–99)
Total CHOL/HDL Ratio: 2.2 ratio
Triglycerides: 82 mg/dL (ref <150)
VLDL: 16 mg/dL (ref 0–40)

## 2023-12-19 LAB — HEPARIN LEVEL (UNFRACTIONATED)
Heparin Unfractionated: 0.29 [IU]/mL — ABNORMAL LOW (ref 0.30–0.70)
Heparin Unfractionated: 0.45 [IU]/mL (ref 0.30–0.70)

## 2023-12-19 LAB — MAGNESIUM: Magnesium: 1.5 mg/dL — ABNORMAL LOW (ref 1.7–2.4)

## 2023-12-19 LAB — PROTIME-INR
INR: 1 (ref 0.8–1.2)
Prothrombin Time: 13.9 s (ref 11.4–15.2)

## 2023-12-19 MED ORDER — PERFLUTREN LIPID MICROSPHERE
1.0000 mL | INTRAVENOUS | Status: AC | PRN
  Administered 2023-12-19: 3 mL via INTRAVENOUS

## 2023-12-19 MED ORDER — HEPARIN BOLUS VIA INFUSION
1000.0000 [IU] | Freq: Once | INTRAVENOUS | Status: AC
Start: 2023-12-19 — End: 2023-12-19
  Administered 2023-12-19: 1000 [IU] via INTRAVENOUS
  Filled 2023-12-19: qty 1000

## 2023-12-19 NOTE — Consult Note (Signed)
 PHARMACY - ANTICOAGULATION CONSULT NOTE  Pharmacy Consult for Heparin  Indication: NSTEMI  Allergies  Allergen Reactions   Lovastatin  Nausea And Vomiting   Bydureon  [Exenatide ] Rash   Losartan Rash    Patient Measurements: Height: 5' 6 (167.6 cm) Weight: 66.3 kg (146 lb 2.6 oz) IBW/kg (Calculated) : 59.3 HEPARIN  DW (KG): 67.1  Vital Signs: Temp: 98.9 F (37.2 C) (09/27 1208) Temp Source: Oral (09/27 0446) BP: 113/73 (09/27 1208) Pulse Rate: 77 (09/27 1208)  Labs: Recent Labs    12/18/23 0916 12/18/23 1347 12/18/23 1524 12/18/23 2045 12/19/23 0411 12/19/23 0820  HGB 11.2*  --   --   --  10.1*  --   HCT 32.3*  --   --   --  28.7*  --   PLT 213  --   --   --  169  --   APTT  --   --  32  --   --   --   LABPROT  --   --  14.6  --  13.9  --   INR  --   --  1.1  --  1.0  --   HEPARINUNFRC  --   --   --  0.22*  --  0.29*  CREATININE 0.94  --   --   --  1.10*  --   TROPONINIHS 21* 393*  --  820* 567*  --     Estimated Creatinine Clearance: 40.7 mL/min (A) (by C-G formula based on SCr of 1.1 mg/dL (H)).   Medical History: Past Medical History:  Diagnosis Date   Arthritis    hands   Diabetes mellitus without complication (HCC)    GERD (gastroesophageal reflux disease)    Hyperlipidemia    Hypertension    TIA (transient ischemic attack)    no deficits   Vertigo     Medications:  No anticoagulants on file.   Assessment: Alison Hernandez presents to the ED w/ NSTEMI. Pharmacy consulted to dose continuous heparin  infusion.  Baseline labs: INR and aPTT pending, Hgb: 11.2 , Plts: 213  9/26:  HL @ 2045 = 0.22, SUBtherapeutic 9/27 0820 HL 0.29    barely SUBtherapeutic  Goal of Therapy:  Heparin  level 0.3-0.7 units/ml Monitor platelets by anticoagulation protocol: Yes   Plan:  9/27 0820 HL 0.29    barely SUBtherapeutic - Will order heparin  1000 units IV X 1 bolus and increase drip rate to 1100 units/hr - recheck HL 8 hrs after rate change - CBC daily   Allean Haas PharmD Clinical Pharmacist 12/19/2023

## 2023-12-19 NOTE — Consult Note (Signed)
 [facility] Cardiology  CARDIOLOGY CONSULT NOTE  Patient ID: Alison Hernandez MRN: [id number] DOB/AGE: 11/09/1945 78 y.o.  Admit date: 12/18/2023 Referring Physician Dr. Dorinda Reason for Consultation NSTEMI  HPI: 78 year old female on whom our service is consulted for NSTEMI.  Past medical history significant for hypertension, hyperlipidemia, diabetes type 2, anemia who presented with back pain and nausea.  Having episodes of right-sided back pain and lower chest region and subsequently developed episodes of nausea and vomiting.  Denies chest pain, increased shortness of breath.  Back pain waxes and wanes with no relation to position or exertion.  Hemodynamically stable in ED.  EKG showed sinus rhythm with left bundle branch block.  Labs significant for peak engraft pattern of troponin elevation, high-sensitivity troponin 21--- 393--- 820--- 560.  Hemoglobin stable.  CTA negative for PE, multivessel coronary artery calcification noted.  Currently resting comfortably in bed with daughter and husband at bedside.  No anginal symptoms at baseline.  Review of systems complete and found to be negative unless listed above     Past Medical History:  Diagnosis Date   Arthritis    hands   Diabetes mellitus without complication (HCC)    GERD (gastroesophageal reflux disease)    Hyperlipidemia    Hypertension    TIA (transient ischemic attack)    no deficits   Vertigo     Past Surgical History:  Procedure Laterality Date   BREAST BIOPSY Left 20+ yrs ago   benign   BREAST BIOPSY Left 20+ yrs ago   benign   CATARACT EXTRACTION W/PHACO Right 05/07/2021   Procedure: CATARACT EXTRACTION PHACO AND INTRAOCULAR LENS PLACEMENT (IOC) RIGHT DIABETIC 19.49 02:17.9;  Surgeon: Jaye Fallow, MD;  Location: [facility];  Service: Ophthalmology;  Laterality: Right;  Diabetic   CATARACT EXTRACTION W/PHACO Left 05/21/2021   Procedure: CATARACT EXTRACTION PHACO AND INTRAOCULAR LENS PLACEMENT (IOC) LEFT  DIABETIC 18.57 01:42.9 ;  Surgeon: Enola Feliciano Hugger, MD;  Location: [facility];  Service: Ophthalmology;  Laterality: Left;  Diabetic   ROTATOR CUFF REPAIR Right 09/30/2016   SIGMOIDOSCOPY  1990   TOTAL ABDOMINAL HYSTERECTOMY     total    Medications Prior to Admission  Medication Sig Dispense Refill Last Dose/Taking   acetaminophen  (TYLENOL ) 650 MG CR tablet Take 1,300 mg by mouth every 8 (eight) hours as needed for pain.   Unknown   aspirin  EC 81 MG tablet Take 81 mg by mouth daily. Swallow whole.   12/18/2023 at  5:00 AM   buPROPion  (WELLBUTRIN  XL) 150 MG 24 hr tablet Take 1 tablet (150 mg total) by mouth every morning. 90 tablet 1 12/17/2023   Dulaglutide  (TRULICITY ) 0.75 MG/0.5ML SOAJ Inject 0.75 mg into the skin once a week. (Patient taking differently: Inject into the skin once a week. Waiting on meds from PAP) 2 mL 0 12/16/2023   hydrochlorothiazide  (HYDRODIURIL ) 25 MG tablet Take 25 mg by mouth daily.   12/17/2023   lisinopril  (ZESTRIL ) 40 MG tablet Take 1 tablet by mouth once daily 90 tablet 1 12/17/2023   meclizine  (ANTIVERT ) 25 MG tablet Take 1 tablet (25 mg total) by mouth 3 (three) times daily as needed for dizziness. 30 tablet 6 Unknown   metFORMIN  (GLUCOPHAGE ) 500 MG tablet Take 500 mg by mouth 2 (two) times daily with a meal.   12/17/2023   nystatin  (NYAMYC ) powder APPLY TOPICALLY FOUR TIMES A DAY AS DIRECTED (Patient taking differently: APPLY TOPICALLY FOUR TIMES A DAY AS DIRECTED PRN) 15 g 10 Unknown  omeprazole  (PRILOSEC) 20 MG capsule Take 1 capsule (20 mg total) by mouth 2 (two) times daily before a meal. 180 capsule 1 12/17/2023   ondansetron  (ZOFRAN -ODT) 8 MG disintegrating tablet PLACE 1 TABLET ON TONGUE AND ALLOW TO DISSOLVE EVERY 8 HOURS AS NEEDED FOR NAUSEA & VOMITING 60 tablet 10 Unknown   rosuvastatin  (CRESTOR ) 10 MG tablet Take 1 tablet (10 mg total) by mouth daily. 90 tablet 1 12/17/2023   alendronate  (FOSAMAX ) 70 MG tablet Take 1 tablet (70 mg total) by  mouth every 7 (seven) days. Take with a full glass of water on an empty stomach. (Patient not taking: Reported on 12/18/2023) 4 tablet 11 Not Taking   Blood Glucose Monitoring Suppl (ONE TOUCH ULTRA 2) w/Device KIT TEST THREE TIMES A DAY 1 kit 0    diclofenac  Sodium (VOLTAREN ) 1 % GEL APPLY 4 GRAMS TOPICALLY FOUR TIMES A DAY AS DIRECTED (Patient not taking: No sig reported) 300 g 10 Not Taking   Dulaglutide  (TRULICITY ) 1.5 MG/0.5ML SOAJ Inject 1.5 mg into the skin once a week for 28 days. 2 mL 0    Dulaglutide  (TRULICITY ) 3 MG/0.5ML SOAJ Inject 3 mg as directed once a week. 2 mL 0    Dulaglutide  (TRULICITY ) 4.5 MG/0.5ML SOAJ Inject 4.5 mg as directed once a week. 6 mL 1    FEROSUL 325 (65 Fe) MG tablet Take 1 tablet by mouth once daily (Patient not taking: Reported on 12/18/2023) 90 tablet 0 Not Taking   glucose blood (ONETOUCH ULTRA) test strip CHECK GLUCOSE ONCE DAILY 100 each 12    glucose blood test strip Use as instructed (Patient not taking: Reported on 12/03/2023) 100 each 12    glucose blood test strip Use as instructed 100 each 12    Lancets (ONETOUCH DELICA PLUS LANCET33G) MISC USE AS DIRECTED THREE TIMES A DAY 100 each 10    metFORMIN  (GLUCOPHAGE ) 1000 MG tablet Take 1 tablet (1,000 mg total) by mouth 2 (two) times daily with a meal. (Patient not taking: Reported on 12/18/2023) 360 tablet 1 Not Taking   Multiple Vitamin (MULTIVITAMIN) tablet Take 1 tablet by mouth daily. (Patient not taking: Reported on 12/18/2023)   Not Taking   Social History   Socioeconomic History   Marital status: Married    Spouse name: Ubaldo   Number of children: 2   Years of education: Not on file   Highest education level: 12th grade  Occupational History   Not on file  Tobacco Use   Smoking status: Never   Smokeless tobacco: Never  Vaping Use   Vaping status: Never Used  Substance and Sexual Activity   Alcohol use: No   Drug use: No   Sexual activity: Not Currently    Birth control/protection: None   Other Topics Concern   Not on file  Social History Narrative   Not on file   Social Drivers of Health   Financial Resource Strain: Medium Risk (12/03/2023)   Overall Financial Resource Strain (CARDIA)    Difficulty of Paying Living Expenses: Somewhat hard  Food Insecurity: No Food Insecurity (12/19/2023)   Hunger Vital Sign    Worried About Running Out of Food in the Last Year: Never true    Ran Out of Food in the Last Year: Never true  Transportation Needs: No Transportation Needs (12/19/2023)   PRAPARE - Administrator, Civil Service (Medical): No    Lack of Transportation (Non-Medical): No  Physical Activity: Inactive (12/03/2023)   Exercise Vital Sign  Days of Exercise per Week: 0 days    Minutes of Exercise per Session: 0 min  Stress: No Stress Concern Present (12/03/2023)   [employer] of Occupational Health - Occupational Stress Questionnaire    Feeling of Stress: Not at all  Social Connections: Moderately Isolated (12/19/2023)   Social Connection and Isolation Panel    Frequency of Communication with Friends and Family: Three times a week    Frequency of Social Gatherings with Friends and Family: More than three times a week    Attends Religious Services: Never    Database administrator or Organizations: No    Attends Banker Meetings: Never    Marital Status: Married  Catering manager Violence: Not At Risk (12/19/2023)   Humiliation, Afraid, Rape, and Kick questionnaire    Fear of Current or Ex-Partner: No    Emotionally Abused: No    Physically Abused: No    Sexually Abused: No    Family History  Problem Relation Age of Onset   Diabetes Mother    Heart disease Mother    Cancer Father        esophagus/ lung   Cancer Sister        Ovarian   Diabetes Maternal Grandmother    Breast cancer Neg Hx       Review of systems complete and found to be negative unless listed above      PHYSICAL EXAM Alert and oriented x 3 No  significant murmur No pedal edema No wheeze or crackles   Labs:   Lab Results  Component Value Date   WBC 4.6 12/19/2023   HGB 10.1 (L) 12/19/2023   HCT 28.7 (L) 12/19/2023   MCV 84.7 12/19/2023   PLT 169 12/19/2023    Recent Labs  Lab 12/19/23 0411  NA 129*  K 3.9  CL 97*  CO2 23  BUN 16  CREATININE 1.10*  CALCIUM  8.9  PROT 6.3*  BILITOT 0.8  ALKPHOS 63  ALT 8  AST 14*  GLUCOSE 129*   No results found for: CKTOTAL, CKMB, CKMBINDEX, TROPONINI  Lab Results  Component Value Date   CHOL 130 12/19/2023   CHOL 212 (H) 08/20/2023   CHOL 149 12/29/2022   Lab Results  Component Value Date   HDL 59 12/19/2023   HDL 70 08/20/2023   HDL 60 12/29/2022   Lab Results  Component Value Date   LDLCALC 55 12/19/2023   LDLCALC 119 (H) 08/20/2023   LDLCALC 68 12/29/2022   Lab Results  Component Value Date   TRIG 82 12/19/2023   TRIG 133 08/20/2023   TRIG 117 12/29/2022   Lab Results  Component Value Date   CHOLHDL 2.2 12/19/2023   No results found for: LDLDIRECT    Radiology: CT Angio Chest PE W and/or Wo Contrast Result Date: 12/18/2023 CLINICAL DATA:  Evaluate pulmonary embolus. Right back pain. Elevated trops. Dizziness and nausea with pain in the upper back radiating to the both shoulders. EXAM: CT ANGIOGRAPHY CHEST WITH CONTRAST TECHNIQUE: Multidetector CT imaging of the chest was performed using the standard protocol during bolus administration of intravenous contrast. Multiplanar CT image reconstructions and MIPs were obtained to evaluate the vascular anatomy. RADIATION DOSE REDUCTION: This exam was performed according to the departmental dose-optimization program which includes automated exposure control, adjustment of the mA and/or kV according to patient size and/or use of iterative reconstruction technique. CONTRAST:  75mL OMNIPAQUE  IOHEXOL  350 MG/ML SOLN COMPARISON:  Chest radiograph 12/18/2023. CT chest abdomen  and pelvis 10/06/2023 FINDINGS:  Cardiovascular: Good opacification of the central and segmental pulmonary arteries. No focal filling defects. No evidence of significant pulmonary embolus. Normal heart size. No pericardial effusions. Normal caliber thoracic aorta. Calcification in the aorta and coronary arteries. Mediastinum/Nodes: No enlarged mediastinal, hilar, or axillary lymph nodes. Thyroid  gland, trachea, and esophagus demonstrate no significant findings. Lungs/Pleura: 5 mm nodule in the right upper lung, series 6, image 45. 3 mm nodule in the left upper lung, image 40. No change since previous studies. Mild dependent atelectasis in the lungs. No pleural effusion or pneumothorax. Upper Abdomen: No acute abnormalities. Musculoskeletal: Degenerative changes in the spine. Focal sclerosis in the mid sternum. Etiology is nonspecific. This could be healing fracture or developing mass lesion. Consider bone scan correlation. Healing left rib fracture. Review of the MIP images confirms the above findings. IMPRESSION: 1. No evidence of significant pulmonary embolus. 2. Bilateral pulmonary nodules, largest measuring 5 mm. No change since prior study. No follow-up needed if patient is low-risk.This recommendation follows the consensus statement: Guidelines for Management of Incidental Pulmonary Nodules Detected on CT Images: From the Fleischner Society 2017; Radiology 2017; 284:228-243. 3. Focal sclerosis demonstrated in the mid sternum with nonspecific etiology. Consider bone scan correlation. Healing left rib fracture. Electronically Signed   By: Elsie Gravely M.D.   On: 12/18/2023 16:02   CT ABDOMEN PELVIS W CONTRAST Result Date: 12/18/2023 EXAM: CT ABDOMEN AND PELVIS WITH CONTRAST 12/18/2023 10:27:07 AM TECHNIQUE: CT of the abdomen and pelvis was performed with the administration of 100 mL of iohexol  (OMNIPAQUE ) 300 MG/ML solution. Multiplanar reformatted images are provided for review. Automated exposure control, iterative reconstruction,  and/or weight-based adjustment of the mA/kV was utilized to reduce the radiation dose to as low as reasonably achievable. COMPARISON: 10/06/2023 CLINICAL HISTORY: Right CVA pain and TTP, emesis. Dizziness, nausea, and upper back pain radiating to shoulders. Ongoing since Tuesday. FINDINGS: LOWER CHEST: Healing fractures of the left ninth, tenth, and 11th ribs laterally. Descending thoracic aortic atheromatous vascular calcification. LIVER: The liver is unremarkable. GALLBLADDER AND BILE DUCTS: Gallbladder is unremarkable. No biliary ductal dilatation. SPLEEN: No acute abnormality. PANCREAS: Small cystic lesions along the pancreas, largest measuring 1.1 x 1.1 x 1.1 cm, unchanged. Based on patient age and lesion size, follow up pancreatic protocol MRI or CT is recommended in 2 years' time. ADRENAL GLANDS: No acute abnormality. KIDNEYS, URETERS AND BLADDER: Small simple bilateral renal cysts warrant no further imaging workup. Stable mild scarring in the right mid kidney laterally not substantially changed from previous. No stones in the kidneys or ureters. No hydronephrosis. No perinephric or periureteral stranding. Urinary bladder is unremarkable. GI AND BOWEL: Periampullary duodenal diverticulum. Sigmoid colon diverticulosis. Stomach demonstrates no acute abnormality. There is no bowel obstruction. PERITONEUM AND RETROPERITONEUM: No ascites. No free air. VASCULATURE: Systemic atherosclerosis is present, including the aorta and iliac arteries. Aorta is normal in caliber. LYMPH NODES: No lymphadenopathy. REPRODUCTIVE ORGANS: Presumed right ovary contains a 1.8 x 1.2 cm simple-appearing cyst. The right ovary parenchyma enhances more than is typically encountered, almost resembling renal enhancement ; pelvic sonography recommended for more definitive characterization. Prior hysterectomy. BONES AND SOFT TISSUES: Healing fractures of the left ninth, tenth, and 11th ribs laterally. Moderate degenerative hip arthropathy,  right greater than left. Small right groin hernia contains adipose tissue. IMPRESSION: 1. No acute abnormalities. 2. Moderate degenerative hip arthropathy, right greater than left. 3. Atherosclerosis, including aortic and iliac arteries. 4. Small pancreatic cysts up to 1.1 cm, unchanged; follow-up MRI or  CT in 2 years recommended. 5. Simple right ovarian cyst measuring 1.8 x 1.2 cm, with unusual enhancement of the right ovary ; recommend pelvic sonography. 6. Small simple bilateral renal cysts. 7. Periampullary duodenal diverticulum and sigmoid diverticulosis. 8. Small right groin fat-containing hernia. 9. Healing fractures of the left ninth, tenth, and 11th ribs. Electronically signed by: Ryan Salvage MD 12/18/2023 11:07 AM EDT RP Workstation: [id number]   DG Chest 2 View Result Date: 12/18/2023 EXAM: 2 VIEW(S) XRAY OF THE CHEST 12/18/2023 10:16:00 AM COMPARISON: 10/06/2023 CLINICAL HISTORY: right sided thoracic back pain. Patient to ED via POV for dizziness and nausea with pain in upper back that radiates into bilateral shoulders. Ongoing since Tuesday. FINDINGS: LUNGS AND PLEURA: No focal pulmonary opacity. No pulmonary edema. No pleural effusion. No pneumothorax. HEART AND MEDIASTINUM: Mild-to-moderate atherosclerotic calcifications in aortic arch. No acute abnormality of the cardiac and mediastinal silhouettes. BONES AND SOFT TISSUES: Mild multilevel degenerative disc changes of thoracic spine. No acute osseous abnormality. IMPRESSION: 1. No acute cardiopulmonary abnormality detected. 2. Atherosclerotic calcifications in the aortic arch. Electronically signed by: Waddell Calk MD 12/18/2023 10:30 AM EDT RP Workstation: [id number]    EKG: Sinus rhythm with LBBB  ASSESSMENT AND PLAN:  NSTEMI, type I versus type Coronary artery disease as noted by coronary calcification CT chest Hypertension Hyperlipidemia Type 2 diabetes  Patient with atypical symptoms of waxing and waning back pain, nausea  and vomiting.  CTA chest negative for PE, multivessel coronary artery calcification noted. Troponin elevation with peak and trough pattern concerning for type I event, although no typical symptoms of chest discomfort and shortness of breath but at the same time no explanation for her troponin rise.  Considering elevated troponin, known coronary artery disease and multiple CAD risk factors-Will proceed with left heart catheterization for further evaluation, to look for significant CAD.  Indication/risk/benefits of left heart catheterization plus or minus PCI discussed with patient and she is agreeable to proceed.  Plan for cath on Monday Echocardiogram pending. Continue current management with heparin  drip, aspirin , statin, antihypertensive  Signed: Keller JAYSON Paterson MD 12/19/2023, 7:56 AM

## 2023-12-19 NOTE — Progress Notes (Signed)
  Echocardiogram 2D Echocardiogram has been performed. Definity IV ultrasound imaging agent used on this study.  Alison Hernandez 12/19/2023, 11:30 AM

## 2023-12-19 NOTE — Plan of Care (Signed)
  Problem: Coping: Goal: Ability to adjust to condition or change in health will improve Outcome: Progressing   Problem: Metabolic: Goal: Ability to maintain appropriate glucose levels will improve Outcome: Progressing   Problem: Tissue Perfusion: Goal: Adequacy of tissue perfusion will improve Outcome: Progressing

## 2023-12-19 NOTE — Consult Note (Signed)
 PHARMACY - ANTICOAGULATION CONSULT NOTE  Pharmacy Consult for Heparin  Indication: NSTEMI  Allergies  Allergen Reactions   Lovastatin  Nausea And Vomiting   Bydureon  [Exenatide ] Rash   Losartan Rash    Patient Measurements: Height: 5' 6 (167.6 cm) Weight: 66.3 kg (146 lb 2.6 oz) IBW/kg (Calculated) : 59.3 HEPARIN  DW (KG): 67.1  Vital Signs: Temp: 98.2 F (36.8 C) (09/27 2018) BP: 92/54 (09/27 2018) Pulse Rate: 83 (09/27 2018)  Labs: Recent Labs    12/18/23 0916 12/18/23 1347 12/18/23 1524 12/18/23 2045 12/19/23 0411 12/19/23 0820 12/19/23 2033  HGB 11.2*  --   --   --  10.1*  --   --   HCT 32.3*  --   --   --  28.7*  --   --   PLT 213  --   --   --  169  --   --   APTT  --   --  32  --   --   --   --   LABPROT  --   --  14.6  --  13.9  --   --   INR  --   --  1.1  --  1.0  --   --   HEPARINUNFRC  --   --   --  0.22*  --  0.29* 0.45  CREATININE 0.94  --   --   --  1.10*  --   --   TROPONINIHS 21* 393*  --  820* 567*  --   --     Estimated Creatinine Clearance: 40.7 mL/min (A) (by C-G formula based on SCr of 1.1 mg/dL (H)).   Medical History: Past Medical History:  Diagnosis Date   Arthritis    hands   Diabetes mellitus without complication (HCC)    GERD (gastroesophageal reflux disease)    Hyperlipidemia    Hypertension    TIA (transient ischemic attack)    no deficits   Vertigo     Medications:  No anticoagulants on file.   Assessment: 16 YOF presents to the ED w/ NSTEMI. Pharmacy consulted to dose continuous heparin  infusion.  Baseline labs: INR and aPTT pending, Hgb: 11.2 , Plts: 213  9/26:  HL @ 2045 = 0.22, SUBtherapeutic 9/27 0820 HL 0.29    barely SUBtherapeutic 9/27 2033 HL 0.45  therapeutic x 1  Goal of Therapy:  Heparin  level 0.3-0.7 units/ml Monitor platelets by anticoagulation protocol: Yes   Plan:  9/27  HL 0.45, therapeutic x 1 - Continue Heparin  at 1100 units/hr - recheck HL in 8 hours for confirmation - CBC daily   Olam Fritter, PharmD, BCPS 12/19/2023 9:37 PM

## 2023-12-19 NOTE — Progress Notes (Signed)
 Progress Note   Patient: Alison Hernandez FMW:[id number] DOB: Sep 20, 1945 DOA: 12/18/2023     0 DOS: the patient was seen and examined on 12/19/2023   Brief hospital course: Alison Hernandez is a 78 y.o. female with medical history significant of hypertension, hyperlipidemia, diabetes, anemia, depression presenting with back pain and nausea.   Patient began having episodes of right sided back pain a few days ago.  Subsequently developed associated episodes of nausea and vomiting, though no nausea today.   Denies fevers, chills, chest pain, shortness of breath, abdominal pain, constipation, diarrhea.   ED Course: Vital signs in the ED notable for blood pressure in the 120s-160s systolic.   Lab workup included CMP with sodium 131, glucose 153.  CBC with hemoglobin stable 11.2.  PT, PTT, INR normal.  Troponin trend 21, 393.  Lipase 61.   Chest x-ray showed no acute normality.     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   CTA PE study showed no PE and noted stable lung nodules.   Patient started on heparin  infusion in the ED, also received Zofran  and 1 L IV fluids.  Assessment and Plan:  NSTEMI Patient presents with nausea vomiting with back pain as well as elevated troponin. troponin elevated to 21, 393.  Will have been seen by cardiologist with plans to have cardiac catheterization on Monday CT angio of the chest did not show acute PE Continue heparin  drip Follow-up on echocardiogram report Follow-up on A1c and lipid panel - Continue ASA, lisinopril , rosuvastatin    Hypertension - Continue lisinopril , hydrochlorothiazide    Hyperlipidemia - Continue home rosuvastatin    Diabetes - SSI   Anemia Monitor CBC   Depression - Continue Wellbutrin    Abnormal CT findings > CT abdomen  pelvis noted pancreatic cysts recommending 2-year follow-up. > CT abdomen pelvis also noted ovarian enhancement abnormality recommending pelvic ultrasound follow-up. > Stable nodules on chest CT    DVT prophylaxis:      Heparin  Code Status:              Full Family Communication: Not present at bedside  Consults called:        Cardiology   Subjective:  Patient seen and examined at bedside this morning Denies worsening chest pain nausea vomiting abdominal pain Have been seen by cardiologist and being planned for cardiac catheterization on Monday  Physical Exam: Cardiovascular:     Rate and Rhythm: Normal rate and regular rhythm.     Pulses: Normal pulses.     Heart sounds: Normal heart sounds.  Pulmonary:     Effort: Pulmonary effort is normal. No respiratory distress.     Breath sounds: Normal breath sounds.  Abdominal:     General: Bowel sounds are normal. There is no distension.     Palpations: Abdomen is soft.     Tenderness: There is no abdominal tenderness.  Musculoskeletal: No edema noted Skin:    General: Skin is warm and dry.  Neurological:     General: No focal deficit present.     Mental Status: Mental status is at baseline.       Vitals:   12/18/23 2351 12/19/23 0446 12/19/23 0817 12/19/23 1208  BP: 111/71 130/73 132/74 113/73  Pulse: 71 74 75 77  Resp: 20 20    Temp: 98.2 F (36.8 C) 98.8 F (37.1 C) 98.6  F (37 C) 98.9 F (37.2 C)  TempSrc:  Oral    SpO2: 100% 98% 98% 98%  Weight:      Height:        Data Reviewed:     Latest Ref Rng & Units 12/19/2023    4:11 AM 12/18/2023    9:16 AM 10/06/2023    3:46 PM  CBC  WBC 4.0 - 10.5 K/uL 4.6  6.3  6.6   Hemoglobin 12.0 - 15.0 g/dL 89.8  88.7  89.6   Hematocrit 36.0 - 46.0 % 28.7  32.3  30.0   Platelets 150 - 400 K/uL 169  213  213        Latest Ref Rng & Units 12/19/2023    4:11 AM 12/18/2023    9:16 AM 10/06/2023    3:46 PM  BMP  Glucose 70 - 99 mg/dL 870  846  875   BUN 8 - 23 mg/dL 16  20   23    Creatinine 0.44 - 1.00 mg/dL 8.89  9.05  9.34   Sodium 135 - 145 mmol/L 129  131  136   Potassium 3.5 - 5.1 mmol/L 3.9  4.2  4.3   Chloride 98 - 111 mmol/L 97  99  102   CO2 22 - 32 mmol/L 23  22  21    Calcium  8.9 - 10.3 mg/dL 8.9  9.2  9.4       Time spent: 52 minutes  Author: Drue ONEIDA Potter, MD 12/19/2023 2:54 PM  For on call review [email or web address].

## 2023-12-19 NOTE — Consult Note (Signed)
 PHARMACY - ANTICOAGULATION CONSULT NOTE  Pharmacy Consult for Heparin  Indication: NSTEMI  Allergies  Allergen Reactions   Lovastatin  Nausea And Vomiting   Bydureon  [Exenatide ] Rash   Losartan Rash    Patient Measurements: Height: 5' 6 (167.6 cm) Weight: 66.3 kg (146 lb 2.6 oz) IBW/kg (Calculated) : 59.3 HEPARIN  DW (KG): 67.1  Vital Signs: Temp: 98.2 F (36.8 C) (09/26 2351) Temp Source: Oral (09/26 1949) BP: 111/71 (09/26 2351) Pulse Rate: 71 (09/26 2351)  Labs: Recent Labs    12/18/23 0916 12/18/23 1347 12/18/23 1524 12/18/23 2045  HGB 11.2*  --   --   --   HCT 32.3*  --   --   --   PLT 213  --   --   --   APTT  --   --  32  --   LABPROT  --   --  14.6  --   INR  --   --  1.1  --   HEPARINUNFRC  --   --   --  0.22*  CREATININE 0.94  --   --   --   TROPONINIHS 21* 393*  --  820*    Estimated Creatinine Clearance: 47.7 mL/min (by C-G formula based on SCr of 0.94 mg/dL).   Medical History: Past Medical History:  Diagnosis Date   Arthritis    hands   Diabetes mellitus without complication (HCC)    GERD (gastroesophageal reflux disease)    Hyperlipidemia    Hypertension    TIA (transient ischemic attack)    no deficits   Vertigo     Medications:  No anticoagulants on file.   Assessment: 70 YOF presents to the ED w/ NSTEMI. Pharmacy consulted to dose continuous heparin  infusion.  Baseline labs: INR and aPTT pending, Hgb: 11.2 , Plts: 213  Goal of Therapy:  Heparin  level 0.3-0.7 units/ml Monitor platelets by anticoagulation protocol: Yes   Plan:  9/26:  HL @ 2045 = 0.22, SUBtherapeutic - Will order heparin  1000 units IV X 1 bolus and increase drip rate to 950 units/hr - recheck HL 8 hrs after rate change - CBC daily   Rolen Conger D 12/19/2023,12:25 AM

## 2023-12-19 NOTE — Plan of Care (Signed)
  Problem: Coping: Goal: Ability to adjust to condition or change in health will improve Outcome: Progressing   Problem: Fluid Volume: Goal: Ability to maintain a balanced intake and output will improve Outcome: Progressing   Problem: Health Behavior/Discharge Planning: Goal: Ability to manage health-related needs will improve Outcome: Progressing

## 2023-12-20 DIAGNOSIS — I214 Non-ST elevation (NSTEMI) myocardial infarction: Secondary | ICD-10-CM | POA: Diagnosis not present

## 2023-12-20 LAB — BASIC METABOLIC PANEL WITH GFR
Anion gap: 8 (ref 5–15)
BUN: 20 mg/dL (ref 8–23)
CO2: 25 mmol/L (ref 22–32)
Calcium: 8.5 mg/dL — ABNORMAL LOW (ref 8.9–10.3)
Chloride: 95 mmol/L — ABNORMAL LOW (ref 98–111)
Creatinine, Ser: 1.12 mg/dL — ABNORMAL HIGH (ref 0.44–1.00)
GFR, Estimated: 51 mL/min — ABNORMAL LOW (ref >60)
Glucose, Bld: 128 mg/dL — ABNORMAL HIGH (ref 70–99)
Potassium: 3.7 mmol/L (ref 3.5–5.1)
Sodium: 128 mmol/L — ABNORMAL LOW (ref 135–145)

## 2023-12-20 LAB — GLUCOSE, CAPILLARY
Glucose-Capillary: 142 mg/dL — ABNORMAL HIGH (ref 70–99)
Glucose-Capillary: 167 mg/dL — ABNORMAL HIGH (ref 70–99)
Glucose-Capillary: 113 mg/dL — ABNORMAL HIGH (ref 70–99)
Glucose-Capillary: 164 mg/dL — ABNORMAL HIGH (ref 70–99)

## 2023-12-20 LAB — CBC WITH DIFFERENTIAL/PLATELET
Abs Immature Granulocytes: 0.02 K/uL (ref 0.00–0.07)
Basophils Absolute: 0 K/uL (ref 0.0–0.1)
Basophils Relative: 0 %
Eosinophils Absolute: 0.1 K/uL (ref 0.0–0.5)
Eosinophils Relative: 1 %
HCT: 31 % — ABNORMAL LOW (ref 36.0–46.0)
Hemoglobin: 10.7 g/dL — ABNORMAL LOW (ref 12.0–15.0)
Immature Granulocytes: 0 %
Lymphocytes Relative: 33 %
Lymphs Abs: 1.7 K/uL (ref 0.7–4.0)
MCH: 28.9 pg (ref 26.0–34.0)
MCHC: 34.5 g/dL (ref 30.0–36.0)
MCV: 83.8 fL (ref 80.0–100.0)
Monocytes Absolute: 0.5 K/uL (ref 0.1–1.0)
Monocytes Relative: 9 %
Neutro Abs: 3 K/uL (ref 1.7–7.7)
Neutrophils Relative %: 57 %
Platelets: 176 K/uL (ref 150–400)
RBC: 3.7 MIL/uL — ABNORMAL LOW (ref 3.87–5.11)
RDW: 12.6 % (ref 11.5–15.5)
WBC: 5.2 K/uL (ref 4.0–10.5)
nRBC: 0 % (ref 0.0–0.2)

## 2023-12-20 LAB — HEPARIN LEVEL (UNFRACTIONATED): Heparin Unfractionated: 0.53 [IU]/mL (ref 0.30–0.70)

## 2023-12-20 MED ORDER — ASPIRIN 81 MG PO CHEW
81.0000 mg | CHEWABLE_TABLET | ORAL | Status: AC
  Administered 2023-12-21: 81 mg via ORAL
  Filled 2023-12-20: qty 1

## 2023-12-20 MED ORDER — FREE WATER
500.0000 mL | Freq: Once | Status: AC
  Administered 2023-12-21: 500 mL via ORAL

## 2023-12-20 MED ORDER — SODIUM CHLORIDE 0.9 % IV BOLUS
1000.0000 mL | Freq: Once | INTRAVENOUS | Status: AC
  Administered 2023-12-20: 1000 mL via INTRAVENOUS

## 2023-12-20 MED ORDER — METOPROLOL SUCCINATE ER 25 MG PO TB24
25.0000 mg | ORAL_TABLET | Freq: Every day | ORAL | Status: DC
  Administered 2023-12-20 – 2023-12-22 (×3): 25 mg via ORAL
  Filled 2023-12-20 (×3): qty 1

## 2023-12-20 NOTE — Progress Notes (Signed)
 Progress Note   Patient: Alison Hernandez FMW:[id number] DOB: Jul 18, 1945 DOA: 12/18/2023     1 DOS: the patient was seen and examined on 12/20/2023     Brief hospital course: From HPI KIYA ENO is a 78 y.o. female with medical history significant of hypertension, hyperlipidemia, diabetes, anemia, depression presenting with back pain and nausea.   Patient began having episodes of right sided back pain a few days ago.  Subsequently developed associated episodes of nausea and vomiting, though no nausea today.   Denies fevers, chills, chest pain, shortness of breath, abdominal pain, constipation, diarrhea.   ED Course: Vital signs in the ED notable for blood pressure in the 120s-160s systolic.   Lab workup included CMP with sodium 131, glucose 153.  CBC with hemoglobin stable 11.2.  PT, PTT, INR normal.  Troponin trend 21, 393.  Lipase 61.   Chest x-ray showed no acute normality.     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   CTA PE study showed no PE and noted stable lung nodules.   Patient started on heparin  infusion in the ED, also received Zofran  and 1 L IV fluids.     Assessment and Plan:   NSTEMI Patient presents with nausea vomiting with back pain as well as elevated troponin. troponin elevated to 21, 393.  Plan of care discussed with cardiologist CT angio of the chest did not show acute PE Continue heparin  drip Follow-up on echocardiogram report Follow-up on A1c and lipid panel - Continue ASA, lisinopril , rosuvastatin  Cardiology is planning on cardiac catheterization tomorrow    Hyponatremia Noted to have hyponatremia of 128 Creatinine slightly elevated Continue IV fluid    Hypertension - Continue lisinopril , hydrochlorothiazide    Hyperlipidemia - Continue home  rosuvastatin    Diabetes - SSI   Anemia Monitor CBC   Depression - Continue Wellbutrin    Abnormal CT findings > CT abdomen pelvis noted pancreatic cysts recommending 2-year follow-up. > CT abdomen pelvis also noted ovarian enhancement abnormality recommending pelvic ultrasound follow-up. > Stable nodules on chest CT    DVT prophylaxis:      Heparin  Code Status:              Full Family Communication: Not present at bedside   Consults called:        Cardiology     Subjective:  Patient seen and examined at bedside this morning Admits to improvement in chest pain Denies nausea vomiting or abdominal pain   Physical Exam: Cardiovascular:     Rate and Rhythm: Normal rate and regular rhythm.     Pulses: Normal pulses.     Heart sounds: Normal heart sounds.  Pulmonary:     Effort: Pulmonary effort is normal. No respiratory distress.     Breath sounds: Normal breath sounds.  Abdominal:     General: Bowel sounds are normal. There is no distension.     Palpations: Abdomen is soft.     Tenderness: There is no abdominal tenderness.  Musculoskeletal: No edema noted Skin:    General: Skin is warm and dry.  Neurological:     General: No focal deficit present.     Mental Status: Mental status is at baseline.       Vitals:   12/20/23 0510 12/20/23 0806 12/20/23 1114 12/20/23 1539  BP: 118/68 121/65 103/66 104/65  Pulse: 87 78  84 70  Resp: 18 20 18 18   Temp: 97.7 F (36.5 C) 98.4 F (36.9 C) 98.3 F (36.8 C) 98.7 F (37.1 C)  TempSrc:  Oral Oral   SpO2: 100% 100% 100% 100%  Weight:      Height:          Latest Ref Rng & Units 12/20/2023    4:58 AM 12/19/2023    4:11 AM 12/18/2023    9:16 AM  CBC  WBC 4.0 - 10.5 K/uL 5.2  4.6  6.3   Hemoglobin 12.0 - 15.0 g/dL 89.2  89.8  88.7   Hematocrit 36.0 - 46.0 % 31.0  28.7  32.3   Platelets 150 - 400 K/uL 176  169  213        Latest Ref Rng & Units 12/20/2023    4:58 AM 12/19/2023    4:11 AM 12/18/2023    9:16 AM  BMP   Glucose 70 - 99 mg/dL 871  870  846   BUN 8 - 23 mg/dL 20  16  20    Creatinine 0.44 - 1.00 mg/dL 8.87  8.89  9.05   Sodium 135 - 145 mmol/L 128  129  131   Potassium 3.5 - 5.1 mmol/L 3.7  3.9  4.2   Chloride 98 - 111 mmol/L 95  97  99   CO2 22 - 32 mmol/L 25  23  22    Calcium  8.9 - 10.3 mg/dL 8.5  8.9  9.2       Author: Drue ONEIDA Potter, MD 12/20/2023 3:44 PM  For on call review [email or web address].

## 2023-12-20 NOTE — Plan of Care (Signed)
   Problem: Fluid Volume: Goal: Ability to maintain a balanced intake and output will improve Outcome: Progressing   Problem: Health Behavior/Discharge Planning: Goal: Ability to manage health-related needs will improve Outcome: Progressing   Problem: Metabolic: Goal: Ability to maintain appropriate glucose levels will improve Outcome: Progressing

## 2023-12-20 NOTE — Progress Notes (Signed)
 [facility] Cardiology  CARDIOLOGY PROGRESS NOTE  Patient ID: Alison Hernandez MRN: [id number] DOB/AGE: February 13, 1946 78 y.o.  Admit date: 12/18/2023 Referring Physician Dr. Dorinda Reason for Consultation NSTEMI  HPI: No complaints this morning and resting comfortably in bed.  No chest pain or shortness of  Review of systems complete and found to be negative unless listed above     Past Medical History:  Diagnosis Date   Arthritis    hands   Diabetes mellitus without complication (HCC)    GERD (gastroesophageal reflux disease)    Hyperlipidemia    Hypertension    TIA (transient ischemic attack)    no deficits   Vertigo     Past Surgical History:  Procedure Laterality Date   BREAST BIOPSY Left 20+ yrs ago   benign   BREAST BIOPSY Left 20+ yrs ago   benign   CATARACT EXTRACTION W/PHACO Right 05/07/2021   Procedure: CATARACT EXTRACTION PHACO AND INTRAOCULAR LENS PLACEMENT (IOC) RIGHT DIABETIC 19.49 02:17.9;  Surgeon: Jaye Fallow, MD;  Location: [facility];  Service: Ophthalmology;  Laterality: Right;  Diabetic   CATARACT EXTRACTION W/PHACO Left 05/21/2021   Procedure: CATARACT EXTRACTION PHACO AND INTRAOCULAR LENS PLACEMENT (IOC) LEFT DIABETIC 18.57 01:42.9 ;  Surgeon: Enola Feliciano Hugger, MD;  Location: [facility];  Service: Ophthalmology;  Laterality: Left;  Diabetic   ROTATOR CUFF REPAIR Right 09/30/2016   SIGMOIDOSCOPY  1990   TOTAL ABDOMINAL HYSTERECTOMY     total    Medications Prior to Admission  Medication Sig Dispense Refill Last Dose/Taking   acetaminophen  (TYLENOL ) 650 MG CR tablet Take 1,300 mg by mouth every 8 (eight) hours as needed for pain.   Unknown   aspirin  EC 81 MG tablet Take 81 mg by mouth daily. Swallow whole.   12/18/2023 at  5:00 AM   buPROPion  (WELLBUTRIN  XL) 150 MG 24 hr tablet Take 1 tablet (150 mg total) by mouth every morning. 90 tablet 1 12/17/2023   Dulaglutide  (TRULICITY ) 0.75 MG/0.5ML SOAJ Inject 0.75 mg into the skin once a week.  (Patient taking differently: Inject into the skin once a week. Waiting on meds from PAP) 2 mL 0 12/16/2023   hydrochlorothiazide  (HYDRODIURIL ) 25 MG tablet Take 25 mg by mouth daily.   12/17/2023   lisinopril  (ZESTRIL ) 40 MG tablet Take 1 tablet by mouth once daily 90 tablet 1 12/17/2023   meclizine  (ANTIVERT ) 25 MG tablet Take 1 tablet (25 mg total) by mouth 3 (three) times daily as needed for dizziness. 30 tablet 6 Unknown   metFORMIN  (GLUCOPHAGE ) 500 MG tablet Take 500 mg by mouth 2 (two) times daily with a meal.   12/17/2023   nystatin  (NYAMYC ) powder APPLY TOPICALLY FOUR TIMES A DAY AS DIRECTED (Patient taking differently: APPLY TOPICALLY FOUR TIMES A DAY AS DIRECTED PRN) 15 g 10 Unknown   omeprazole  (PRILOSEC) 20 MG capsule Take 1 capsule (20 mg total) by mouth 2 (two) times daily before a meal. 180 capsule 1 12/17/2023   ondansetron  (ZOFRAN -ODT) 8 MG disintegrating tablet PLACE 1 TABLET ON TONGUE AND ALLOW TO DISSOLVE EVERY 8 HOURS AS NEEDED FOR NAUSEA & VOMITING 60 tablet 10 Unknown   rosuvastatin  (CRESTOR ) 10 MG tablet Take 1 tablet (10 mg total) by mouth daily. 90 tablet 1 12/17/2023   alendronate  (FOSAMAX ) 70 MG tablet Take 1 tablet (70 mg total) by mouth every 7 (seven) days. Take with a full glass of water on an empty stomach. (Patient not taking: Reported on 12/18/2023) 4 tablet 11 Not  Taking   Blood Glucose Monitoring Suppl (ONE TOUCH ULTRA 2) w/Device KIT TEST THREE TIMES A DAY 1 kit 0    diclofenac  Sodium (VOLTAREN ) 1 % GEL APPLY 4 GRAMS TOPICALLY FOUR TIMES A DAY AS DIRECTED (Patient not taking: No sig reported) 300 g 10 Not Taking   Dulaglutide  (TRULICITY ) 1.5 MG/0.5ML SOAJ Inject 1.5 mg into the skin once a week for 28 days. 2 mL 0    Dulaglutide  (TRULICITY ) 3 MG/0.5ML SOAJ Inject 3 mg as directed once a week. 2 mL 0    Dulaglutide  (TRULICITY ) 4.5 MG/0.5ML SOAJ Inject 4.5 mg as directed once a week. 6 mL 1    FEROSUL 325 (65 Fe) MG tablet Take 1 tablet by mouth once daily (Patient not  taking: Reported on 12/18/2023) 90 tablet 0 Not Taking   glucose blood (ONETOUCH ULTRA) test strip CHECK GLUCOSE ONCE DAILY 100 each 12    glucose blood test strip Use as instructed (Patient not taking: Reported on 12/03/2023) 100 each 12    glucose blood test strip Use as instructed 100 each 12    Lancets (ONETOUCH DELICA PLUS LANCET33G) MISC USE AS DIRECTED THREE TIMES A DAY 100 each 10    metFORMIN  (GLUCOPHAGE ) 1000 MG tablet Take 1 tablet (1,000 mg total) by mouth 2 (two) times daily with a meal. (Patient not taking: Reported on 12/18/2023) 360 tablet 1 Not Taking   Multiple Vitamin (MULTIVITAMIN) tablet Take 1 tablet by mouth daily. (Patient not taking: Reported on 12/18/2023)   Not Taking   Social History   Socioeconomic History   Marital status: Married    Spouse name: Ubaldo   Number of children: 2   Years of education: Not on file   Highest education level: 12th grade  Occupational History   Not on file  Tobacco Use   Smoking status: Never   Smokeless tobacco: Never  Vaping Use   Vaping status: Never Used  Substance and Sexual Activity   Alcohol use: No   Drug use: No   Sexual activity: Not Currently    Birth control/protection: None  Other Topics Concern   Not on file  Social History Narrative   Not on file   Social Drivers of Health   Financial Resource Strain: Medium Risk (12/03/2023)   Overall Financial Resource Strain (CARDIA)    Difficulty of Paying Living Expenses: Somewhat hard  Food Insecurity: No Food Insecurity (12/19/2023)   Hunger Vital Sign    Worried About Running Out of Food in the Last Year: Never true    Ran Out of Food in the Last Year: Never true  Transportation Needs: No Transportation Needs (12/19/2023)   PRAPARE - Administrator, Civil Service (Medical): No    Lack of Transportation (Non-Medical): No  Physical Activity: Inactive (12/03/2023)   Exercise Vital Sign    Days of Exercise per Week: 0 days    Minutes of Exercise per Session:  0 min  Stress: No Stress Concern Present (12/03/2023)   [employer] of Occupational Health - Occupational Stress Questionnaire    Feeling of Stress: Not at all  Social Connections: Moderately Isolated (12/19/2023)   Social Connection and Isolation Panel    Frequency of Communication with Friends and Family: Three times a week    Frequency of Social Gatherings with Friends and Family: More than three times a week    Attends Religious Services: Never    Database administrator or Organizations: No    Attends Club or  Organization Meetings: Never    Marital Status: Married  Catering manager Violence: Not At Risk (12/19/2023)   Humiliation, Afraid, Rape, and Kick questionnaire    Fear of Current or Ex-Partner: No    Emotionally Abused: No    Physically Abused: No    Sexually Abused: No    Family History  Problem Relation Age of Onset   Diabetes Mother    Heart disease Mother    Cancer Father        esophagus/ lung   Cancer Sister        Ovarian   Diabetes Maternal Grandmother    Breast cancer Neg Hx       Review of systems complete and found to be negative unless listed above      PHYSICAL EXAM  Alert and oriented x 3 No pedal edema No wheeze or crackles No significant murmur  Labs:   Lab Results  Component Value Date   WBC 5.2 12/20/2023   HGB 10.7 (L) 12/20/2023   HCT 31.0 (L) 12/20/2023   MCV 83.8 12/20/2023   PLT 176 12/20/2023    Recent Labs  Lab 12/19/23 0411 12/20/23 0458  NA 129* 128*  K 3.9 3.7  CL 97* 95*  CO2 23 25  BUN 16 20  CREATININE 1.10* 1.12*  CALCIUM  8.9 8.5*  PROT 6.3*  --   BILITOT 0.8  --   ALKPHOS 63  --   ALT 8  --   AST 14*  --   GLUCOSE 129* 128*   No results found for: CKTOTAL, CKMB, CKMBINDEX, TROPONINI  Lab Results  Component Value Date   CHOL 130 12/19/2023   CHOL 212 (H) 08/20/2023   CHOL 149 12/29/2022   Lab Results  Component Value Date   HDL 59 12/19/2023   HDL 70 08/20/2023   HDL 60 12/29/2022    Lab Results  Component Value Date   LDLCALC 55 12/19/2023   LDLCALC 119 (H) 08/20/2023   LDLCALC 68 12/29/2022   Lab Results  Component Value Date   TRIG 82 12/19/2023   TRIG 133 08/20/2023   TRIG 117 12/29/2022   Lab Results  Component Value Date   CHOLHDL 2.2 12/19/2023   No results found for: LDLDIRECT    Radiology: ECHOCARDIOGRAM COMPLETE Result Date: 12/19/2023    ECHOCARDIOGRAM REPORT   Patient Name:   Alison Hernandez Date of Exam: 12/19/2023 Medical Rec #:  [id number]       Height:       66.0 in Accession #:    [id number]      Weight:       146.2 lb Date of Birth:  June 23, 1945       BSA:          1.750 m Patient Age:    78 years        BP:           130/73 mmHg Patient Gender: F               HR:           75 bpm. Exam Location:  [facility] Procedure: 2D Echo, Cardiac Doppler, Color Doppler and Intracardiac            Opacification Agent (Both Spectral and Color Flow Doppler were            utilized during procedure). Indications:     NSTEMI I21.4  History:         Patient has prior  history of Echocardiogram examinations, most                  recent 04/04/2016.  Sonographer:     Thedora Louder RDCS, FASE Referring Phys:  [id number] MARSA NOVAK MELVIN Diagnosing Phys: Keller Paterson  Sonographer Comments: Technically difficult study due to poor echo windows. Image acquisition challenging due to respiratory motion. IMPRESSIONS  1. Left ventricular ejection fraction, by estimation, is 25 to 30%. The left ventricle has severely decreased function. The left ventricle demonstrates regional wall motion abnormalities (see scoring diagram/findings for description). Left ventricular diastolic parameters are consistent with Grade I diastolic dysfunction (impaired relaxation).  2. Right ventricular systolic function is normal. The right ventricular size is normal.  3. The mitral valve is normal in structure. Trivial mitral valve regurgitation.  4. The aortic valve was not well visualized. Aortic valve  regurgitation is not visualized.  5. The inferior vena cava is normal in size with greater than 50% respiratory variability, suggesting right atrial pressure of 3 mmHg. FINDINGS  Left Ventricle: Left ventricular ejection fraction, by estimation, is 25 to 30%. The left ventricle has severely decreased function. The left ventricle demonstrates regional wall motion abnormalities. Definity contrast agent was given IV to delineate the left ventricular endocardial borders. The left ventricular internal cavity size was normal in size. There is no left ventricular hypertrophy. Left ventricular diastolic parameters are consistent with Grade I diastolic dysfunction (impaired relaxation).  LV Wall Scoring: The mid and distal anterior wall, mid and distal anterior septum, entire apex, and mid inferoseptal segment are akinetic. The basal anteroseptal segment, mid anterolateral segment, mid inferior segment, and basal inferoseptal segment are hypokinetic. The posterior wall, basal anterolateral segment, basal anterior segment, and basal inferior segment are normal. Right Ventricle: The right ventricular size is normal. No increase in right ventricular wall thickness. Right ventricular systolic function is normal. Left Atrium: Left atrial size was normal in size. Right Atrium: Right atrial size was normal in size. Pericardium: There is no evidence of pericardial effusion. Mitral Valve: The mitral valve is normal in structure. Trivial mitral valve regurgitation. Tricuspid Valve: The tricuspid valve is normal in structure. Tricuspid valve regurgitation is trivial. Aortic Valve: The aortic valve was not well visualized. Aortic valve regurgitation is not visualized. Aortic valve peak gradient measures 3.0 mmHg. Pulmonic Valve: The pulmonic valve was not well visualized. Pulmonic valve regurgitation is not visualized. Aorta: The aortic root and ascending aorta are structurally normal, with no evidence of dilitation. Venous: The  inferior vena cava is normal in size with greater than 50% respiratory variability, suggesting right atrial pressure of 3 mmHg. IAS/Shunts: The atrial septum is grossly normal.  LEFT VENTRICLE PLAX 2D LVIDd:         5.20 cm     Diastology LVIDs:         4.40 cm     LV e' medial:    3.81 cm/s LV PW:         0.90 cm     LV E/e' medial:  22.4 LV IVS:        0.90 cm     LV e' lateral:   7.07 cm/s LVOT diam:     2.10 cm     LV E/e' lateral: 12.1 LV SV:         42 LV SV Index:   24 LVOT Area:     3.46 cm  LV Volumes (MOD) LV vol d, MOD A2C: 42.0 ml LV vol d, MOD A4C:  98.7 ml LV vol s, MOD A2C: 30.6 ml LV vol s, MOD A4C: 64.1 ml LV SV MOD A2C:     11.4 ml LV SV MOD A4C:     98.7 ml LV SV MOD BP:      21.8 ml RIGHT VENTRICLE RV Basal diam:  2.60 cm RV S prime:     13.80 cm/s LEFT ATRIUM             Index        RIGHT ATRIUM          Index LA diam:        3.00 cm 1.71 cm/m   RA Area:     6.79 cm LA Vol (A2C):   42.1 ml 24.05 ml/m  RA Volume:   12.10 ml 6.91 ml/m LA Vol (A4C):   22.2 ml 12.68 ml/m LA Biplane Vol: 33.1 ml 18.91 ml/m  AORTIC VALVE AV Area (Vmax): 2.58 cm AV Vmax:        87.00 cm/s AV Peak Grad:   3.0 mmHg LVOT Vmax:      64.70 cm/s LVOT Vmean:     41.400 cm/s LVOT VTI:       0.122 m  AORTA Ao Root diam: 3.20 cm MITRAL VALVE MV Area (PHT): 4.89 cm    SHUNTS MV Decel Time: 155 msec    Systemic VTI:  0.12 m MV E velocity: 85.40 cm/s  Systemic Diam: 2.10 cm MV A velocity: 34.10 cm/s MV E/A ratio:  2.50 Keller Paterson Electronically signed by Keller Paterson Signature Date/Time: 12/19/2023/11:57:02 AM    Final    CT Angio Chest PE W and/or Wo Contrast Result Date: 12/18/2023 CLINICAL DATA:  Evaluate pulmonary embolus. Right back pain. Elevated trops. Dizziness and nausea with pain in the upper back radiating to the both shoulders. EXAM: CT ANGIOGRAPHY CHEST WITH CONTRAST TECHNIQUE: Multidetector CT imaging of the chest was performed using the standard protocol during bolus administration of intravenous  contrast. Multiplanar CT image reconstructions and MIPs were obtained to evaluate the vascular anatomy. RADIATION DOSE REDUCTION: This exam was performed according to the departmental dose-optimization program which includes automated exposure control, adjustment of the mA and/or kV according to patient size and/or use of iterative reconstruction technique. CONTRAST:  75mL OMNIPAQUE  IOHEXOL  350 MG/ML SOLN COMPARISON:  Chest radiograph 12/18/2023. CT chest abdomen and pelvis 10/06/2023 FINDINGS: Cardiovascular: Good opacification of the central and segmental pulmonary arteries. No focal filling defects. No evidence of significant pulmonary embolus. Normal heart size. No pericardial effusions. Normal caliber thoracic aorta. Calcification in the aorta and coronary arteries. Mediastinum/Nodes: No enlarged mediastinal, hilar, or axillary lymph nodes. Thyroid  gland, trachea, and esophagus demonstrate no significant findings. Lungs/Pleura: 5 mm nodule in the right upper lung, series 6, image 45. 3 mm nodule in the left upper lung, image 40. No change since previous studies. Mild dependent atelectasis in the lungs. No pleural effusion or pneumothorax. Upper Abdomen: No acute abnormalities. Musculoskeletal: Degenerative changes in the spine. Focal sclerosis in the mid sternum. Etiology is nonspecific. This could be healing fracture or developing mass lesion. Consider bone scan correlation. Healing left rib fracture. Review of the MIP images confirms the above findings. IMPRESSION: 1. No evidence of significant pulmonary embolus. 2. Bilateral pulmonary nodules, largest measuring 5 mm. No change since prior study. No follow-up needed if patient is low-risk.This recommendation follows the consensus statement: Guidelines for Management of Incidental Pulmonary Nodules Detected on CT Images: From the Fleischner Society 2017; Radiology 2017; 284:228-243. 3. Focal  sclerosis demonstrated in the mid sternum with nonspecific etiology.  Consider bone scan correlation. Healing left rib fracture. Electronically Signed   By: Elsie Gravely M.D.   On: 12/18/2023 16:02   CT ABDOMEN PELVIS W CONTRAST Result Date: 12/18/2023 EXAM: CT ABDOMEN AND PELVIS WITH CONTRAST 12/18/2023 10:27:07 AM TECHNIQUE: CT of the abdomen and pelvis was performed with the administration of 100 mL of iohexol  (OMNIPAQUE ) 300 MG/ML solution. Multiplanar reformatted images are provided for review. Automated exposure control, iterative reconstruction, and/or weight-based adjustment of the mA/kV was utilized to reduce the radiation dose to as low as reasonably achievable. COMPARISON: 10/06/2023 CLINICAL HISTORY: Right CVA pain and TTP, emesis. Dizziness, nausea, and upper back pain radiating to shoulders. Ongoing since Tuesday. FINDINGS: LOWER CHEST: Healing fractures of the left ninth, tenth, and 11th ribs laterally. Descending thoracic aortic atheromatous vascular calcification. LIVER: The liver is unremarkable. GALLBLADDER AND BILE DUCTS: Gallbladder is unremarkable. No biliary ductal dilatation. SPLEEN: No acute abnormality. PANCREAS: Small cystic lesions along the pancreas, largest measuring 1.1 x 1.1 x 1.1 cm, unchanged. Based on patient age and lesion size, follow up pancreatic protocol MRI or CT is recommended in 2 years' time. ADRENAL GLANDS: No acute abnormality. KIDNEYS, URETERS AND BLADDER: Small simple bilateral renal cysts warrant no further imaging workup. Stable mild scarring in the right mid kidney laterally not substantially changed from previous. No stones in the kidneys or ureters. No hydronephrosis. No perinephric or periureteral stranding. Urinary bladder is unremarkable. GI AND BOWEL: Periampullary duodenal diverticulum. Sigmoid colon diverticulosis. Stomach demonstrates no acute abnormality. There is no bowel obstruction. PERITONEUM AND RETROPERITONEUM: No ascites. No free air. VASCULATURE: Systemic atherosclerosis is present, including the aorta and  iliac arteries. Aorta is normal in caliber. LYMPH NODES: No lymphadenopathy. REPRODUCTIVE ORGANS: Presumed right ovary contains a 1.8 x 1.2 cm simple-appearing cyst. The right ovary parenchyma enhances more than is typically encountered, almost resembling renal enhancement ; pelvic sonography recommended for more definitive characterization. Prior hysterectomy. BONES AND SOFT TISSUES: Healing fractures of the left ninth, tenth, and 11th ribs laterally. Moderate degenerative hip arthropathy, right greater than left. Small right groin hernia contains adipose tissue. IMPRESSION: 1. No acute abnormalities. 2. Moderate degenerative hip arthropathy, right greater than left. 3. Atherosclerosis, including aortic and iliac arteries. 4. 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 5. Simple right ovarian cyst measuring 1.8 x 1.2 cm, with unusual enhancement of the right ovary ; recommend pelvic sonography. 6. Small simple bilateral renal cysts. 7. Periampullary duodenal diverticulum and sigmoid diverticulosis. 8. Small right groin fat-containing hernia. 9. Healing fractures of the left ninth, tenth, and 11th ribs. Electronically signed by: Ryan Salvage MD 12/18/2023 11:07 AM EDT RP Workstation: [id number]   DG Chest 2 View Result Date: 12/18/2023 EXAM: 2 VIEW(S) XRAY OF THE CHEST 12/18/2023 10:16:00 AM COMPARISON: 10/06/2023 CLINICAL HISTORY: right sided thoracic back pain. Patient to ED via POV for dizziness and nausea with pain in upper back that radiates into bilateral shoulders. Ongoing since Tuesday. FINDINGS: LUNGS AND PLEURA: No focal pulmonary opacity. No pulmonary edema. No pleural effusion. No pneumothorax. HEART AND MEDIASTINUM: Mild-to-moderate atherosclerotic calcifications in aortic arch. No acute abnormality of the cardiac and mediastinal silhouettes. BONES AND SOFT TISSUES: Mild multilevel degenerative disc changes of thoracic spine. No acute osseous  abnormality. IMPRESSION: 1. No acute cardiopulmonary abnormality detected. 2. Atherosclerotic calcifications in the aortic arch. 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 Plan for left heart catheterization tomorrow.  N.p.o. after midnight. Continue heparin  drip, aspirin , statin Continue lisinopril .  Will add Toprol-XL 25 mg daily.  Signed: Keller JAYSON Paterson MD 12/20/2023, 8:40 AM

## 2023-12-20 NOTE — Consult Note (Signed)
 PHARMACY - ANTICOAGULATION CONSULT NOTE  Pharmacy Consult for Heparin  Indication: NSTEMI  Allergies  Allergen Reactions   Lovastatin  Nausea And Vomiting   Bydureon  [Exenatide ] Rash   Losartan Rash    Patient Measurements: Height: 5' 6 (167.6 cm) Weight: 66.3 kg (146 lb 2.6 oz) IBW/kg (Calculated) : 59.3 HEPARIN  DW (KG): 67.1  Vital Signs: Temp: 97.7 F (36.5 C) (09/28 0510) BP: 118/68 (09/28 0510) Pulse Rate: 87 (09/28 0510)  Labs: Recent Labs    12/18/23 0916 12/18/23 0916 12/18/23 1347 12/18/23 1524 12/18/23 2045 12/19/23 0411 12/19/23 0820 12/19/23 2033 12/20/23 0458  HGB 11.2*  --   --   --   --  10.1*  --   --  10.7*  HCT 32.3*  --   --   --   --  28.7*  --   --  31.0*  PLT 213  --   --   --   --  169  --   --  176  APTT  --   --   --  32  --   --   --   --   --   LABPROT  --   --   --  14.6  --  13.9  --   --   --   INR  --   --   --  1.1  --  1.0  --   --   --   HEPARINUNFRC  --    < >  --   --  0.22*  --  0.29* 0.45 0.53  CREATININE 0.94  --   --   --   --  1.10*  --   --  1.12*  TROPONINIHS 21*  --  393*  --  820* 567*  --   --   --    < > = values in this interval not displayed.    Estimated Creatinine Clearance: 40 mL/min (A) (by C-G formula based on SCr of 1.12 mg/dL (H)).   Medical History: Past Medical History:  Diagnosis Date   Arthritis    hands   Diabetes mellitus without complication (HCC)    GERD (gastroesophageal reflux disease)    Hyperlipidemia    Hypertension    TIA (transient ischemic attack)    no deficits   Vertigo     Medications:  No anticoagulants on file.   Assessment: 15 YOF presents to the ED w/ NSTEMI. Pharmacy consulted to dose continuous heparin  infusion.  Baseline labs: INR and aPTT pending, Hgb: 11.2 , Plts: 213  9/26:  HL @ 2045 = 0.22, SUBtherapeutic 9/27 0820 HL 0.29    barely SUBtherapeutic 9/27 2033 HL 0.45  therapeutic x 1 9/28 0458 HL 0.53  therapeutic x 2   Goal of Therapy:  Heparin  level  0.3-0.7 units/ml Monitor platelets by anticoagulation protocol: Yes   Plan:  9/28:  HL @ 0458 = 0.53, therapeutic X 2 - will continue pt on current rate and recheck HL on 9/29 with AM labs  - CBC daily   Rhonda Vangieson D 12/20/2023 5:41 AM

## 2023-12-20 NOTE — Plan of Care (Signed)
  Problem: Cardiac: Goal: Ability to achieve and maintain adequate cardiovascular perfusion will improve Outcome: Progressing   Problem: Clinical Measurements: Goal: Ability to maintain clinical measurements within normal limits will improve Outcome: Progressing   Problem: Clinical Measurements: Goal: Cardiovascular complication will be avoided Outcome: Progressing   Problem: Activity: Goal: Risk for activity intolerance will decrease Outcome: Progressing   Problem: Pain Managment: Goal: General experience of comfort will improve and/or be controlled Outcome: Progressing   Problem: Safety: Goal: Ability to remain free from injury will improve Outcome: Progressing

## 2023-12-21 ENCOUNTER — Encounter: Admission: EM | Disposition: A | Payer: Self-pay | Source: Ambulatory Visit | Attending: Internal Medicine

## 2023-12-21 ENCOUNTER — Telehealth (HOSPITAL_COMMUNITY): Payer: Self-pay | Admitting: Pharmacy Technician

## 2023-12-21 ENCOUNTER — Other Ambulatory Visit (HOSPITAL_COMMUNITY): Payer: Self-pay

## 2023-12-21 DIAGNOSIS — I214 Non-ST elevation (NSTEMI) myocardial infarction: Secondary | ICD-10-CM | POA: Diagnosis not present

## 2023-12-21 LAB — CBC WITH DIFFERENTIAL/PLATELET
Abs Immature Granulocytes: 0.06 K/uL (ref 0.00–0.07)
Basophils Absolute: 0 K/uL (ref 0.0–0.1)
Basophils Relative: 0 %
Eosinophils Absolute: 0.1 K/uL (ref 0.0–0.5)
Eosinophils Relative: 2 %
HCT: 28.7 % — ABNORMAL LOW (ref 36.0–46.0)
Hemoglobin: 10 g/dL — ABNORMAL LOW (ref 12.0–15.0)
Immature Granulocytes: 1 %
Lymphocytes Relative: 31 %
Lymphs Abs: 1.6 K/uL (ref 0.7–4.0)
MCH: 29.7 pg (ref 26.0–34.0)
MCHC: 34.8 g/dL (ref 30.0–36.0)
MCV: 85.2 fL (ref 80.0–100.0)
Monocytes Absolute: 0.5 K/uL (ref 0.1–1.0)
Monocytes Relative: 10 %
Neutro Abs: 3 K/uL (ref 1.7–7.7)
Neutrophils Relative %: 56 %
Platelets: 172 K/uL (ref 150–400)
RBC: 3.37 MIL/uL — ABNORMAL LOW (ref 3.87–5.11)
RDW: 12.6 % (ref 11.5–15.5)
WBC: 5.3 K/uL (ref 4.0–10.5)
nRBC: 0 % (ref 0.0–0.2)

## 2023-12-21 LAB — HEPARIN LEVEL (UNFRACTIONATED): Heparin Unfractionated: 0.35 [IU]/mL (ref 0.30–0.70)

## 2023-12-21 LAB — BASIC METABOLIC PANEL WITH GFR
Anion gap: 8 (ref 5–15)
BUN: 21 mg/dL (ref 8–23)
CO2: 24 mmol/L (ref 22–32)
Calcium: 8.7 mg/dL — ABNORMAL LOW (ref 8.9–10.3)
Chloride: 98 mmol/L (ref 98–111)
Creatinine, Ser: 1.12 mg/dL — ABNORMAL HIGH (ref 0.44–1.00)
GFR, Estimated: 51 mL/min — ABNORMAL LOW (ref >60)
Glucose, Bld: 145 mg/dL — ABNORMAL HIGH (ref 70–99)
Potassium: 3.9 mmol/L (ref 3.5–5.1)
Sodium: 130 mmol/L — ABNORMAL LOW (ref 135–145)

## 2023-12-21 LAB — GLUCOSE, CAPILLARY
Glucose-Capillary: 149 mg/dL — ABNORMAL HIGH (ref 70–99)
Glucose-Capillary: 137 mg/dL — ABNORMAL HIGH (ref 70–99)
Glucose-Capillary: 134 mg/dL — ABNORMAL HIGH (ref 70–99)
Glucose-Capillary: 151 mg/dL — ABNORMAL HIGH (ref 70–99)

## 2023-12-21 LAB — LIPOPROTEIN A (LPA): Lipoprotein (a): 43.3 nmol/L — ABNORMAL HIGH (ref <75.0)

## 2023-12-21 SURGERY — LEFT HEART CATH AND CORONARY ANGIOGRAPHY
Anesthesia: Moderate Sedation

## 2023-12-21 MED ORDER — FENTANYL CITRATE (PF) 100 MCG/2ML IJ SOLN
INTRAMUSCULAR | Status: AC
  Filled 2023-12-21: qty 2

## 2023-12-21 MED ORDER — HEPARIN SODIUM (PORCINE) 1000 UNIT/ML IJ SOLN
INTRAMUSCULAR | Status: DC | PRN
  Administered 2023-12-21: 2000 [IU] via INTRAVENOUS

## 2023-12-21 MED ORDER — ACETAMINOPHEN 325 MG PO TABS: 650.0000 mg | ORAL_TABLET | ORAL | Status: DC | PRN

## 2023-12-21 MED ORDER — SODIUM CHLORIDE 0.9 % IV SOLN: 250.0000 mL | INTRAVENOUS | Status: AC | PRN

## 2023-12-21 MED ORDER — ONDANSETRON HCL 4 MG/2ML IJ SOLN: 4.0000 mg | Freq: Four times a day (QID) | INTRAMUSCULAR | Status: DC | PRN

## 2023-12-21 MED ORDER — HEPARIN (PORCINE) IN NACL 1000-0.9 UT/500ML-% IV SOLN
INTRAVENOUS | Status: AC
  Filled 2023-12-21: qty 1000

## 2023-12-21 MED ORDER — MIDAZOLAM HCL 2 MG/2ML IJ SOLN
INTRAMUSCULAR | Status: AC
  Filled 2023-12-21: qty 2

## 2023-12-21 MED ORDER — SODIUM CHLORIDE 0.9% FLUSH: 3.0000 mL | INTRAVENOUS | Status: DC | PRN

## 2023-12-21 MED ORDER — FREE WATER
250.0000 mL | Freq: Once | Status: AC
  Administered 2023-12-21: 250 mL via ORAL

## 2023-12-21 MED ORDER — MIDAZOLAM HCL 2 MG/2ML IJ SOLN
INTRAMUSCULAR | Status: DC | PRN
  Administered 2023-12-21: 1 mg via INTRAVENOUS

## 2023-12-21 MED ORDER — SODIUM CHLORIDE 0.9 % IV BOLUS
500.0000 mL | Freq: Once | INTRAVENOUS | Status: AC
  Administered 2023-12-21: 500 mL via INTRAVENOUS

## 2023-12-21 MED ORDER — HEPARIN (PORCINE) IN NACL 1000-0.9 UT/500ML-% IV SOLN
INTRAVENOUS | Status: DC | PRN
  Administered 2023-12-21: 1000 mL

## 2023-12-21 MED ORDER — HYDRALAZINE HCL 20 MG/ML IJ SOLN: 10.0000 mg | INTRAMUSCULAR | Status: AC | PRN

## 2023-12-21 MED ORDER — VERAPAMIL HCL 2.5 MG/ML IV SOLN
INTRAVENOUS | Status: DC | PRN
  Administered 2023-12-21: 2.5 mg via INTRA_ARTERIAL

## 2023-12-21 MED ORDER — FENTANYL CITRATE (PF) 100 MCG/2ML IJ SOLN
INTRAMUSCULAR | Status: DC | PRN
  Administered 2023-12-21: 25 ug via INTRAVENOUS

## 2023-12-21 MED ORDER — LIDOCAINE HCL 1 % IJ SOLN
INTRAMUSCULAR | Status: AC
  Filled 2023-12-21: qty 20

## 2023-12-21 MED ORDER — VERAPAMIL HCL 2.5 MG/ML IV SOLN
INTRAVENOUS | Status: AC
  Filled 2023-12-21: qty 2

## 2023-12-21 MED ORDER — LIDOCAINE HCL (PF) 1 % IJ SOLN
INTRAMUSCULAR | Status: DC | PRN
  Administered 2023-12-21: 2 mL

## 2023-12-21 MED ORDER — SODIUM CHLORIDE 0.9% FLUSH
3.0000 mL | Freq: Two times a day (BID) | INTRAVENOUS | Status: DC
  Administered 2023-12-21: 3 mL via INTRAVENOUS

## 2023-12-21 MED ORDER — HEPARIN SODIUM (PORCINE) 1000 UNIT/ML IJ SOLN
INTRAMUSCULAR | Status: AC
  Filled 2023-12-21: qty 10

## 2023-12-21 MED ORDER — IOHEXOL 300 MG/ML  SOLN
INTRAMUSCULAR | Status: DC | PRN
  Administered 2023-12-21: 56 mL

## 2023-12-21 SURGICAL SUPPLY — 10 items
CATH INFINITI 5 FR JL3.5 (CATHETERS) IMPLANT
CATH INFINITI JR4 5F (CATHETERS) IMPLANT
DEVICE RAD TR BAND REGULAR (VASCULAR PRODUCTS) IMPLANT
DRAPE BRACHIAL (DRAPES) IMPLANT
GLIDESHEATH SLEND SS 6F .021 (SHEATH) IMPLANT
GUIDEWIRE INQWIRE 1.5J.035X260 (WIRE) IMPLANT
PACK CARDIAC CATH (CUSTOM PROCEDURE TRAY) ×1 IMPLANT
SET ATX-X65L (MISCELLANEOUS) IMPLANT
STATION PROTECTION PRESSURIZED (MISCELLANEOUS) IMPLANT
WIRE HITORQ VERSACORE ST 145CM (WIRE) IMPLANT

## 2023-12-21 NOTE — Progress Notes (Signed)
 Progress Note   Patient: Alison Hernandez FMW:[id number] DOB: 1946/03/08 DOA: 12/18/2023     2 DOS: the patient was seen and examined on 12/21/2023     Brief hospital course: From HPI Alison Hernandez is a 78 y.o. female with medical history significant of hypertension, hyperlipidemia, diabetes, anemia, depression presenting with back pain and nausea.   Patient began having episodes of right sided back pain a few days ago.  Subsequently developed associated episodes of nausea and vomiting, though no nausea today.   Denies fevers, chills, chest pain, shortness of breath, abdominal pain, constipation, diarrhea.   ED Course: Vital signs in the ED notable for blood pressure in the 120s-160s systolic.   Lab workup included CMP with sodium 131, glucose 153.  CBC with hemoglobin stable 11.2.  PT, PTT, INR normal.  Troponin trend 21, 393.  Lipase 61.   Chest x-ray showed no acute normality.     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   CTA PE study showed no PE and noted stable lung nodules.   Patient started on heparin  infusion in the ED, also received Zofran  and 1 L IV fluids.     Assessment and Plan:   NSTEMI Patient presents with nausea vomiting with back pain as well as elevated troponin. troponin elevated to 21, 393.  Plan of care discussed with cardiologist CT angio of the chest did not show acute PE Continue heparin  drip Echo with EF 25 to 30% Follow-up on A1c and lipid panel - Continue ASA, lisinopril , rosuvastatin  Cardiac cath shows normal coronaries   Hyponatremia Noted to have hyponatremia of 128 Creatinine slightly elevated Continue IV fluid     Hypertension - Continue lisinopril , hydrochlorothiazide    Hyperlipidemia - Continue home rosuvastatin    Diabetes - SSI    Anemia Monitor CBC   Depression - Continue Wellbutrin    Abnormal CT findings > CT abdomen pelvis noted pancreatic cysts recommending 2-year follow-up. > CT abdomen pelvis also noted ovarian enhancement abnormality recommending pelvic ultrasound follow-up. > Stable nodules on chest CT    DVT prophylaxis:      Heparin  Code Status:              Full Family Communication: Not present at bedside   Consults called:        Cardiology     Subjective:  Patient seen and examined at bedside this morning Underwent cardiac catheterization today Denies nausea vomiting or abdominal pain     Physical Exam: Cardiovascular:     Rate and Rhythm: Normal rate and regular rhythm.     Pulses: Normal pulses.     Heart sounds: Normal heart sounds.  Pulmonary:     Effort: Pulmonary effort is normal. No respiratory distress.     Breath sounds: Normal breath sounds.  Abdominal:     General: Bowel sounds are normal. There is no distension.     Palpations: Abdomen is soft.     Tenderness: There is no abdominal tenderness.  Musculoskeletal: No edema noted Skin:    General: Skin is warm and dry.  Neurological:     General: No focal deficit present.     Mental Status: Mental status is at baseline.        Vitals:   12/21/23 1615 12/21/23 1630 12/21/23 1645 12/21/23 1700  BP: 116/82 (!) 92/42 (!) 96/46 (!) 95/50  Pulse: 85 78 68 72  Resp: 17 19 16 16   Temp:      TempSrc:      SpO2: 95% 96% 97% 97%  Weight:      Height:          Latest Ref Rng & Units 12/21/2023    3:11 AM 12/20/2023    4:58 AM 12/19/2023    4:11 AM  BMP  Glucose 70 - 99 mg/dL 854  871  870   BUN 8 - 23 mg/dL 21  20  16    Creatinine 0.44 - 1.00 mg/dL 8.87  8.87  8.89   Sodium 135 - 145 mmol/L 130  128  129   Potassium 3.5 - 5.1 mmol/L 3.9  3.7  3.9   Chloride 98 - 111 mmol/L 98  95  97   CO2 22 - 32 mmol/L 24  25  23    Calcium  8.9 - 10.3 mg/dL 8.7  8.5  8.9      Author: Drue ONEIDA Potter, MD 12/21/2023 5:14 PM  For  on call review [email or web address].

## 2023-12-21 NOTE — Progress Notes (Signed)
 Heart Failure Navigator Progress Note  Assessed for Heart & Vascular TOC clinic readiness.  Patient does not meet criteria due to [facility] patient.   Navigator will sign off at this time.  Charmaine Pines, RN, BSN [facility] Heart Failure Navigator Secure Chat Only

## 2023-12-21 NOTE — Progress Notes (Signed)
 [facility] CLINIC CARDIOLOGY PROGRESS NOTE   Patient ID: Alison Hernandez MRN: [id number] DOB/AGE: 1945-06-07 78 y.o.  Admit date: 12/18/2023 Referring Physician Dr. Dorinda Primary Physician Vicci Duwaine SQUIBB, DO  Primary Cardiologist None Reason for Consultation NSTEMI  HPI: Alison Hernandez is a 78 y.o. female with a past medical history of hypertension, hyperlipidemia, diabetes type 2, anemia  who presented to the ED on 12/18/2023 for back pain and nausea. Troponins found to be elevated and cardiology was consulted for further evaluation.   Interval History:  - Patient seen and examined this morning, sitting upright on side of hospital bed. - She states that overall she feels better today, denies any recurrence of back pain or nausea. - BP and heart rate remain controlled today.  Renal function stable. - Plan for LHC later this afternoon with Dr. Florencio, patient aware of plan and all questions answered.  Review of systems complete and found to be negative unless listed above   Vitals:   12/20/23 2000 12/20/23 2338 12/21/23 0340 12/21/23 0737  BP:  124/63 131/64 (!) 149/68  Pulse:  62 64 69  Resp:  18 20 16   Temp:  98.3 F (36.8 C) 97.9 F (36.6 C) 98.2 F (36.8 C)  TempSrc:      SpO2:  98% 100% 99%  Weight: 72 kg     Height:         Intake/Output Summary (Last 24 hours) at 12/21/2023 1013 Last data filed at 12/21/2023 0504 Gross per 24 hour  Intake 980 ml  Output 1 ml  Net 979 ml     PHYSICAL EXAM General: Well-appearing elderly female, well nourished, in no acute distress. HEENT: Normocephalic and atraumatic. Neck: No JVD.  Lungs: Normal respiratory effort on room air. Clear bilaterally to auscultation. No wheezes, crackles, rhonchi.  Heart: HRRR. Normal S1 and S2 without gallops or murmurs. Radial & DP pulses 2+ bilaterally. Abdomen: Non-distended appearing.  Msk: Normal strength and tone for age. Extremities: No clubbing, cyanosis or edema.   Neuro: Alert and  oriented X 3. Psych: Mood appropriate, affect congruent.    LABS: Basic Metabolic Panel: Recent Labs    12/19/23 0411 12/20/23 0458 12/21/23 0311  NA 129* 128* 130*  K 3.9 3.7 3.9  CL 97* 95* 98  CO2 23 25 24   GLUCOSE 129* 128* 145*  BUN 16 20 21   CREATININE 1.10* 1.12* 1.12*  CALCIUM  8.9 8.5* 8.7*  MG 1.5*  --   --    Liver Function Tests: Recent Labs    12/19/23 0411  AST 14*  ALT 8  ALKPHOS 63  BILITOT 0.8  PROT 6.3*  ALBUMIN 3.6   No results for input(s): LIPASE, AMYLASE in the last 72 hours. CBC: Recent Labs    12/20/23 0458 12/21/23 0311  WBC 5.2 5.3  NEUTROABS 3.0 3.0  HGB 10.7* 10.0*  HCT 31.0* 28.7*  MCV 83.8 85.2  PLT 176 172   Cardiac Enzymes: Recent Labs    12/18/23 1347 12/18/23 2045 12/19/23 0411  TROPONINIHS 393* 820* 567*   BNP: No results for input(s): BNP in the last 72 hours. D-Dimer: No results for input(s): DDIMER in the last 72 hours. Hemoglobin A1C: No results for input(s): HGBA1C in the last 72 hours. Fasting Lipid Panel: Recent Labs    12/19/23 0411  CHOL 130  HDL 59  LDLCALC 55  TRIG 82  CHOLHDL 2.2   Thyroid  Function Tests: No results for input(s): TSH, T4TOTAL, T3FREE, THYROIDAB in the last  72 hours.  Invalid input(s): FREET3 Anemia Panel: No results for input(s): VITAMINB12, FOLATE, FERRITIN, TIBC, IRON , RETICCTPCT in the last 72 hours.  ECHOCARDIOGRAM COMPLETE Result Date: 12/19/2023    ECHOCARDIOGRAM REPORT   Patient Name:   Alison Hernandez Date of Exam: 12/19/2023 Medical Rec #:  [id number]       Height:       66.0 in Accession #:    [id number]      Weight:       146.2 lb Date of Birth:  03/29/1945       BSA:          1.750 m Patient Age:    78 years        BP:           130/73 mmHg Patient Gender: F               HR:           75 bpm. Exam Location:  [facility] Procedure: 2D Echo, Cardiac Doppler, Color Doppler and Intracardiac            Opacification Agent (Both Spectral and Color  Flow Doppler were            utilized during procedure). Indications:     NSTEMI I21.4  History:         Patient has prior history of Echocardiogram examinations, most                  recent 04/04/2016.  Sonographer:     Thedora Louder RDCS, FASE Referring Phys:  [id number] MARSA NOVAK MELVIN Diagnosing Phys: Keller Paterson  Sonographer Comments: Technically difficult study due to poor echo windows. Image acquisition challenging due to respiratory motion. IMPRESSIONS  1. Left ventricular ejection fraction, by estimation, is 25 to 30%. The left ventricle has severely decreased function. The left ventricle demonstrates regional wall motion abnormalities (see scoring diagram/findings for description). Left ventricular diastolic parameters are consistent with Grade I diastolic dysfunction (impaired relaxation).  2. Right ventricular systolic function is normal. The right ventricular size is normal.  3. The mitral valve is normal in structure. Trivial mitral valve regurgitation.  4. The aortic valve was not well visualized. Aortic valve regurgitation is not visualized.  5. The inferior vena cava is normal in size with greater than 50% respiratory variability, suggesting right atrial pressure of 3 mmHg. FINDINGS  Left Ventricle: Left ventricular ejection fraction, by estimation, is 25 to 30%. The left ventricle has severely decreased function. The left ventricle demonstrates regional wall motion abnormalities. Definity contrast agent was given IV to delineate the left ventricular endocardial borders. The left ventricular internal cavity size was normal in size. There is no left ventricular hypertrophy. Left ventricular diastolic parameters are consistent with Grade I diastolic dysfunction (impaired relaxation).  LV Wall Scoring: The mid and distal anterior wall, mid and distal anterior septum, entire apex, and mid inferoseptal segment are akinetic. The basal anteroseptal segment, mid anterolateral segment, mid inferior  segment, and basal inferoseptal segment are hypokinetic. The posterior wall, basal anterolateral segment, basal anterior segment, and basal inferior segment are normal. Right Ventricle: The right ventricular size is normal. No increase in right ventricular wall thickness. Right ventricular systolic function is normal. Left Atrium: Left atrial size was normal in size. Right Atrium: Right atrial size was normal in size. Pericardium: There is no evidence of pericardial effusion. Mitral Valve: The mitral valve is normal in structure. Trivial mitral valve regurgitation. Tricuspid Valve: The tricuspid valve  is normal in structure. Tricuspid valve regurgitation is trivial. Aortic Valve: The aortic valve was not well visualized. Aortic valve regurgitation is not visualized. Aortic valve peak gradient measures 3.0 mmHg. Pulmonic Valve: The pulmonic valve was not well visualized. Pulmonic valve regurgitation is not visualized. Aorta: The aortic root and ascending aorta are structurally normal, with no evidence of dilitation. Venous: The inferior vena cava is normal in size with greater than 50% respiratory variability, suggesting right atrial pressure of 3 mmHg. IAS/Shunts: The atrial septum is grossly normal.  LEFT VENTRICLE PLAX 2D LVIDd:         5.20 cm     Diastology LVIDs:         4.40 cm     LV e' medial:    3.81 cm/s LV PW:         0.90 cm     LV E/e' medial:  22.4 LV IVS:        0.90 cm     LV e' lateral:   7.07 cm/s LVOT diam:     2.10 cm     LV E/e' lateral: 12.1 LV SV:         42 LV SV Index:   24 LVOT Area:     3.46 cm  LV Volumes (MOD) LV vol d, MOD A2C: 42.0 ml LV vol d, MOD A4C: 98.7 ml LV vol s, MOD A2C: 30.6 ml LV vol s, MOD A4C: 64.1 ml LV SV MOD A2C:     11.4 ml LV SV MOD A4C:     98.7 ml LV SV MOD BP:      21.8 ml RIGHT VENTRICLE RV Basal diam:  2.60 cm RV S prime:     13.80 cm/s LEFT ATRIUM             Index        RIGHT ATRIUM          Index LA diam:        3.00 cm 1.71 cm/m   RA Area:     6.79 cm LA  Vol (A2C):   42.1 ml 24.05 ml/m  RA Volume:   12.10 ml 6.91 ml/m LA Vol (A4C):   22.2 ml 12.68 ml/m LA Biplane Vol: 33.1 ml 18.91 ml/m  AORTIC VALVE AV Area (Vmax): 2.58 cm AV Vmax:        87.00 cm/s AV Peak Grad:   3.0 mmHg LVOT Vmax:      64.70 cm/s LVOT Vmean:     41.400 cm/s LVOT VTI:       0.122 m  AORTA Ao Root diam: 3.20 cm MITRAL VALVE MV Area (PHT): 4.89 cm    SHUNTS MV Decel Time: 155 msec    Systemic VTI:  0.12 m MV E velocity: 85.40 cm/s  Systemic Diam: 2.10 cm MV A velocity: 34.10 cm/s MV E/A ratio:  2.50 Keller Paterson Electronically signed by Keller Paterson Signature Date/Time: 12/19/2023/11:57:02 AM    Final      ECHO as above  TELEMETRY reviewed by me 12/21/23: Sinus rhythm rate 80s  EKG reviewed by me 12/21/23: Sinus rhythm with left bundle branch block rate 73 bpm  DATA reviewed by me 12/21/23: last 24h vitals tele labs imaging I/O, hospitalist progress note  Principal Problem:   NSTEMI (non-ST elevated myocardial infarction) (HCC) Active Problems:   Diabetes (HCC)   Benign hypertensive renal disease   Hyperlipidemia   Depression   Anemia    ASSESSMENT AND PLAN: Alison Hernandez  is a 78 y.o. female with a past medical history of hypertension, hyperlipidemia, diabetes type 2, anemia  who presented to the ED on 12/18/2023 for back pain and nausea. Troponins found to be elevated and cardiology was consulted for further evaluation.   # NSTEMI, type I vs type II # New HFrEF, cardiomyopathy # Coronary artery disease by CT # Hypertension # Hyperlipidemia Patient initially presented with back pain, nausea.  Troponins found to be elevated and trended 21 > 393 > 820 > 567. Echo with EF 25-30%, multiple wall motion abnormalities as described in echo report above, grade 1 diastolic dysfunction.  -Discussed the risks and benefits of proceeding with LHC for further evaluation with the patient.  She is agreeable to proceed.  NPO until LHC this afternoon (12/21/23) with Dr.  Florencio.  Written consent will be obtained.  Further recommendations following LHC.   - Continue aspirin  81 mg daily, Crestor  10 mg daily. - Continue metoprolol succinate 25 mg daily, lisinopril  40 mg daily.  Will continue to titrate GDMT as able while admitted.  This patient's case was discussed and created with Dr. Florencio and he is in agreement.  Signed:  Danita Bloch, PA-C  12/21/2023, 10:13 AM [facility]

## 2023-12-21 NOTE — Consult Note (Signed)
 PHARMACY - ANTICOAGULATION CONSULT NOTE  Pharmacy Consult for Heparin  Indication: NSTEMI  Allergies  Allergen Reactions   Lovastatin  Nausea And Vomiting   Bydureon  [Exenatide ] Rash   Losartan Rash    Patient Measurements: Height: 5' 6 (167.6 cm) Weight: 72 kg (158 lb 11.7 oz) IBW/kg (Calculated) : 59.3 HEPARIN  DW (KG): 67.1  Vital Signs: Temp: 97.9 F (36.6 C) (09/29 0340) BP: 131/64 (09/29 0340) Pulse Rate: 64 (09/29 0340)  Labs: Recent Labs    12/18/23 1347 12/18/23 1524 12/18/23 2045 12/19/23 0411 12/19/23 0820 12/19/23 2033 12/20/23 0458 12/21/23 0311  HGB  --   --   --  10.1*  --   --  10.7* 10.0*  HCT  --   --   --  28.7*  --   --  31.0* 28.7*  PLT  --   --   --  169  --   --  176 172  APTT  --  32  --   --   --   --   --   --   LABPROT  --  14.6  --  13.9  --   --   --   --   INR  --  1.1  --  1.0  --   --   --   --   HEPARINUNFRC  --   --  0.22*  --    < > 0.45 0.53 0.35  CREATININE  --   --   --  1.10*  --   --  1.12* 1.12*  TROPONINIHS 393*  --  820* 567*  --   --   --   --    < > = values in this interval not displayed.    Estimated Creatinine Clearance: 43.4 mL/min (A) (by C-G formula based on SCr of 1.12 mg/dL (H)).   Medical History: Past Medical History:  Diagnosis Date   Arthritis    hands   Diabetes mellitus without complication (HCC)    GERD (gastroesophageal reflux disease)    Hyperlipidemia    Hypertension    TIA (transient ischemic attack)    no deficits   Vertigo     Medications:  No anticoagulants on file.   Assessment: Alison Hernandez presents to the ED w/ NSTEMI. Pharmacy consulted to dose continuous heparin  infusion.  Baseline labs: INR and aPTT pending, Hgb: 11.2 , Plts: 213  9/26:  HL @ 2045 = 0.22, SUBtherapeutic 9/27 0820 HL 0.29    barely SUBtherapeutic 9/27 2033 HL 0.45  therapeutic x 1 9/28 0458 HL 0.53  therapeutic x 2  9/29 0311 HL 0.35  therapeutic x 3  Goal of Therapy:  Heparin  level 0.3-0.7 units/ml Monitor  platelets by anticoagulation protocol: Yes   Plan:  9/29:  HL @ 0311 = 0.35, therapeutic X 3 - will continue pt on current rate and recheck HL on 9/30 with AM labs  - CBC daily   Marcel Sorter D 12/21/2023 4:22 AM

## 2023-12-21 NOTE — Progress Notes (Signed)
 Heart Failure Stewardship Pharmacy Note  PCP: Vicci Duwaine SQUIBB, DO PCP-Cardiologist: None  HPI: Alison Hernandez is a 78 y.o. female with hypertension, hyperlipidemia, diabetes type 2, anemia who presented with back pain and nausea. On admission, BNP was not measured, HS-troponin was 21 >393 > 820, and lipase 61. Chest x-ray noted no acute abnormality.   Pertinent cardiac history: TTE 03/2016 with normal LVEF. TTE 12/19/23 showed LVEF down to 25-30%, G1DD, trivial MR.   Pertinent Lab Values: Creatinine, Ser  Date Value Ref Range Status  12/21/2023 1.12 (H) 0.44 - 1.00 mg/dL Final   BUN  Date Value Ref Range Status  12/21/2023 21 8 - 23 mg/dL Final  93/79/7974 31 (H) 8 - 27 mg/dL Final   Potassium  Date Value Ref Range Status  12/21/2023 3.9 3.5 - 5.1 mmol/L Final   Sodium  Date Value Ref Range Status  12/21/2023 130 (L) 135 - 145 mmol/L Final  09/11/2023 134 134 - 144 mmol/L Final   BNP  Date Value Ref Range Status  02/01/2016 CANCELED pg/mL     Comment:    Testing of the specimen submitted could not be completed due to a specimen identification problem.  No Patient Identification on Container. CONTACTED TIFFANY R AT YOUR FACILITY 02/05/2016  Result canceled by the ancillary    Magnesium  Date Value Ref Range Status  12/19/2023 1.5 (L) 1.7 - 2.4 mg/dL Final    Comment:    Performed at [facility], [address] [place], [place] [address]   Hemoglobin A1C  Date Value Ref Range Status  11/28/2015 7.5  Final   HB A1C (BAYER DCA - WAIVED)  Date Value Ref Range Status  11/25/2023 7.3 (H) 4.8 - 5.6 % Final    Comment:             Prediabetes: 5.7 - 6.4          Diabetes: >6.4          Glycemic control for adults with diabetes: <7.0    TSH  Date Value Ref Range Status  09/11/2023 2.500 0.450 - 4.500 uIU/mL Final    Vital Signs:  Temp:  [97.9 F (36.6 C)-98.7 F (37.1 C)] 97.9 F (36.6 C) (09/29 0340) Pulse Rate:  [62-84] 64 (09/29  0340) Cardiac Rhythm: Normal sinus rhythm;Bundle branch block (09/28 1900) Resp:  [16-20] 20 (09/29 0340) BP: (103-131)/(63-66) 131/64 (09/29 0340) SpO2:  [98 %-100 %] 100 % (09/29 0340) Weight:  [72 kg (158 lb 11.7 oz)] 72 kg (158 lb 11.7 oz) (09/28 2000)  Intake/Output Summary (Last 24 hours) at 12/21/2023 0725 Last data filed at 12/21/2023 0504 Gross per 24 hour  Intake 980 ml  Output 1 ml  Net 979 ml    Current Heart Failure Medications:  Loop diuretic: none Beta-Blocker: metoprolol succinate 25 mg daily ACEI/ARB/ARNI: lisinopril  40 mg daily MRA: none SGLT2i: none Other: none  Prior to admission Heart Failure Medications:  Loop diuretic: none Beta-Blocker: none ACEI/ARB/ARNI: lisinopril  40 mg daily MRA: none SGLT2i: none Other: none  Assessment: 1. Acute systolic heart failure (LVEF 25-30%) with G1DD, due to unknown etiology. NYHA class I-II symptoms.  -Symptoms: Reports back pain resolved. Denies shortness of breath. Does report LEE. -Volume: Appears relatively euvolemic. Will wait to see filling pressures on cath.  -Hemodynamics: BP elevated. HR 60s. -BB: Continue metoprolol succinate 25 mg daily. Does not appear low output, can titrate as HR allows.  -ACEI/ARB/ARNI: Currently on lisinopril  40 mg daily. Checking cost on  Entresto, may be nice to transition to losartan for a day then transition to Entresto if cost-effective. -MRA: Consider adding spironolactone 25 mg daily after cath. -SGLT2i: Will check cost, consider adding prior to discharge.  Plan: 1) Medication changes recommended at this time: -Consider adding spironolactone after cath  2) Patient assistance: -Pending  3) Education: - Patient has been educated on current HF medications and potential additions to HF medication regimen - Patient verbalizes understanding that over the next few months, these medication doses may change and more medications may be added to optimize HF regimen - Patient has been  educated on basic disease state pathophysiology and goals of therapy  Please do not hesitate to reach out with questions or concerns,  Saber Dickerman, PharmD, CPP, BCPS, [facility] Heart Failure Pharmacist  Phone - [phone] 12/21/2023 11:12 AM

## 2023-12-21 NOTE — Telephone Encounter (Signed)
 Patient Product/process development scientist completed.    The patient is insured through [employer]. Patient has Medicare and is not eligible for a copay card, but may be able to apply for patient assistance or Medicare RX Payment Plan (Patient Must reach out to their plan, if eligible for payment plan), if available.    Ran test claim for Farxiga 10 mg and the current 30 day co-pay is $253.82.  Ran test claim for Jardiance 10 mg and the current 30 day co-pay is $47.00.  This test claim was processed through [facility]- copay amounts may vary at other pharmacies due to pharmacy/plan contracts, or as the patient moves through the different stages of their insurance plan.     Morgan Arab, CPHT Pharmacy Technician III Certified Patient Advocate [facility] Patient Advocate Team Direct Number: [phone]  Fax: [phone]

## 2023-12-22 ENCOUNTER — Other Ambulatory Visit: Payer: Self-pay

## 2023-12-22 DIAGNOSIS — I214 Non-ST elevation (NSTEMI) myocardial infarction: Secondary | ICD-10-CM | POA: Diagnosis not present

## 2023-12-22 LAB — CBC WITH DIFFERENTIAL/PLATELET
Abs Immature Granulocytes: 0.02 K/uL (ref 0.00–0.07)
Basophils Absolute: 0 K/uL (ref 0.0–0.1)
Basophils Relative: 0 %
Eosinophils Absolute: 0.1 K/uL (ref 0.0–0.5)
Eosinophils Relative: 1 %
HCT: 27 % — ABNORMAL LOW (ref 36.0–46.0)
Hemoglobin: 9.5 g/dL — ABNORMAL LOW (ref 12.0–15.0)
Immature Granulocytes: 0 %
Lymphocytes Relative: 25 %
Lymphs Abs: 1.4 K/uL (ref 0.7–4.0)
MCH: 29.8 pg (ref 26.0–34.0)
MCHC: 35.2 g/dL (ref 30.0–36.0)
MCV: 84.6 fL (ref 80.0–100.0)
Monocytes Absolute: 0.5 K/uL (ref 0.1–1.0)
Monocytes Relative: 9 %
Neutro Abs: 3.6 K/uL (ref 1.7–7.7)
Neutrophils Relative %: 65 %
Platelets: 172 K/uL (ref 150–400)
RBC: 3.19 MIL/uL — ABNORMAL LOW (ref 3.87–5.11)
RDW: 12.8 % (ref 11.5–15.5)
WBC: 5.5 K/uL (ref 4.0–10.5)
nRBC: 0 % (ref 0.0–0.2)

## 2023-12-22 LAB — OSMOLALITY: Osmolality: 271 mosm/kg — ABNORMAL LOW (ref 275–295)

## 2023-12-22 LAB — BASIC METABOLIC PANEL WITH GFR
Anion gap: 7 (ref 5–15)
BUN: 23 mg/dL (ref 8–23)
CO2: 25 mmol/L (ref 22–32)
Calcium: 8.6 mg/dL — ABNORMAL LOW (ref 8.9–10.3)
Chloride: 96 mmol/L — ABNORMAL LOW (ref 98–111)
Creatinine, Ser: 1.17 mg/dL — ABNORMAL HIGH (ref 0.44–1.00)
GFR, Estimated: 48 mL/min — ABNORMAL LOW (ref >60)
Glucose, Bld: 139 mg/dL — ABNORMAL HIGH (ref 70–99)
Potassium: 3.9 mmol/L (ref 3.5–5.1)
Sodium: 128 mmol/L — ABNORMAL LOW (ref 135–145)

## 2023-12-22 LAB — GLUCOSE, CAPILLARY
Glucose-Capillary: 143 mg/dL — ABNORMAL HIGH (ref 70–99)
Glucose-Capillary: 194 mg/dL — ABNORMAL HIGH (ref 70–99)

## 2023-12-22 LAB — SODIUM, URINE, RANDOM: Sodium, Ur: 60 mmol/L

## 2023-12-22 LAB — OSMOLALITY, URINE: Osmolality, Ur: 625 mosm/kg (ref 300–900)

## 2023-12-22 MED ORDER — SPIRONOLACTONE 25 MG PO TABS
25.0000 mg | ORAL_TABLET | Freq: Every day | ORAL | Status: DC
  Administered 2023-12-22: 25 mg via ORAL
  Filled 2023-12-22: qty 1

## 2023-12-22 MED ORDER — METOPROLOL SUCCINATE ER 25 MG PO TB24
25.0000 mg | ORAL_TABLET | Freq: Every day | ORAL | 1 refills | Status: DC
  Filled 2023-12-22: qty 30, 30d supply, fill #0
  Filled 2024-01-15: qty 30, 30d supply, fill #1

## 2023-12-22 MED ORDER — SPIRONOLACTONE 25 MG PO TABS
25.0000 mg | ORAL_TABLET | Freq: Every day | ORAL | 1 refills | Status: DC
  Filled 2023-12-22: qty 30, 30d supply, fill #0
  Filled 2024-01-15: qty 30, 30d supply, fill #1

## 2023-12-22 NOTE — TOC CM/SW Note (Signed)
 Transition of Care [facility]) - Inpatient Brief Assessment   Patient Details  Name: Alison Hernandez MRN: [id number] Date of Birth: Nov 13, 1945  [facility] CM/SW Contact:    Lauraine JAYSON Carpen, LCSW Phone Number: 12/22/2023, 2:13 PM   Clinical Narrative: Patient has orders to discharge home today. Chart reviewed. No TOC needs identified. CSW signing off.  Transition of Care Asessment: Insurance and Status: Insurance coverage has been reviewed Patient has primary care physician: Yes Home environment has been reviewed: Single family home Prior level of function:: Not documented Prior/Current Home Services: No current home services Social Drivers of Health Review: SDOH reviewed no interventions necessary Readmission risk has been reviewed: Yes Transition of care needs: no transition of care needs at this time

## 2023-12-22 NOTE — Discharge Summary (Signed)
 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 female with medical history significant of hypertension, hyperlipidemia, diabetes, anemia, depression presenting with back pain and nausea. Patient began having episodes of right sided back pain a few days ago.  Subsequently developed associated episodes of nausea and vomiting ED Course: Vital signs in the ED notable for blood pressure in the 120s-160s systolic. Lab workup included CMP with sodium 131, glucose 153.  CBC with hemoglobin stable 11.2.  PT, PTT, INR normal.  Troponin trend 21, 393.  Lipase 61. Chest x-ray showed no acute normality.   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 CTA PE study showed no PE and noted stable lung nodules. Patient started on heparin  infusion in the ED, also received Zofran  and 1 L IV fluids.      Other Hospital course as noted below Assessment and Plan:   NSTEMI Acute on chronic systolic and diastolic congestive heart failure Patient presents with nausea vomiting with back pain as well as elevated troponin. troponin elevated to 21, 393.  Plan of care discussed with cardiologist CT angio of the chest did  not show acute PE Continue heparin  drip Echo with EF 25 to 30% - Continue ASA, lisinopril , rosuvastatin  Cardiac cath shows normal coronaries Patient has been cleared by cardiologist for discharge today   Hyponatremia secondary to SIADH Urine studies suggestive of SIADH Patient has been counseled to fluid restrict   Hypertension Continue lisinopril  and metoprolol   Hyperlipidemia - Continue home rosuvastatin    Diabetes Resume home medication at discharge  Anemia of chronic disease-continue monitoring CBC at discharge   Depression - Continue Wellbutrin    Abnormal CT findings > CT abdomen pelvis noted pancreatic cysts recommending 2-year follow-up. Patient to have follow-up pelvic ultrasound at discharge-discussed with patient   Consultants: Cardiologist Procedures performed: Cardiac catheterization Disposition: Home Diet recommendation:  Cardiac diet DISCHARGE MEDICATION: Allergies as of 12/22/2023       Reactions   Lovastatin  Nausea And Vomiting   Bydureon  [exenatide ] Rash   Losartan Rash        Medication List     STOP taking these medications    hydrochlorothiazide  25 MG tablet Commonly known as: HYDRODIURIL        TAKE these medications    acetaminophen  650 MG CR tablet Commonly known as: TYLENOL  Take 1,300 mg by mouth every 8 (eight) hours as needed for pain.   alendronate  70 MG tablet Commonly known as: FOSAMAX  Take 1 tablet (70 mg total) by mouth every 7 (seven) days. Take with a full glass of water on an empty stomach.   aspirin  EC 81 MG tablet Take 81 mg by mouth daily. Swallow whole.   buPROPion  150 MG 24 hr tablet Commonly known as: WELLBUTRIN  XL Take 1 tablet (150 mg total) by mouth every morning.   diclofenac   Sodium 1 % Gel Commonly known as: VOLTAREN  APPLY 4 GRAMS TOPICALLY FOUR TIMES A DAY AS DIRECTED   FeroSul 325 (65 Fe) MG tablet Generic drug: ferrous sulfate  Take 1 tablet by mouth once daily   lisinopril  40 MG  tablet Commonly known as: ZESTRIL  Take 1 tablet by mouth once daily   meclizine  25 MG tablet Commonly known as: ANTIVERT  Take 1 tablet (25 mg total) by mouth 3 (three) times daily as needed for dizziness.   metFORMIN  500 MG tablet Commonly known as: GLUCOPHAGE  Take 500 mg by mouth 2 (two) times daily with a meal. What changed: Another medication with the same name was removed. Continue taking this medication, and follow the directions you see here.   metoprolol succinate 25 MG 24 hr tablet Commonly known as: TOPROL-XL Take 1 tablet (25 mg total) by mouth daily. Start taking on: December 23, 2023   multivitamin tablet Take 1 tablet by mouth daily.   nystatin  powder Commonly known as: Nyamyc  APPLY TOPICALLY FOUR TIMES A DAY AS DIRECTED What changed: additional instructions   omeprazole  20 MG capsule Commonly known as: PRILOSEC Take 1 capsule (20 mg total) by mouth 2 (two) times daily before a meal.   ondansetron  8 MG disintegrating tablet Commonly known as: ZOFRAN -ODT PLACE 1 TABLET ON TONGUE AND ALLOW TO DISSOLVE EVERY 8 HOURS AS NEEDED FOR NAUSEA & VOMITING   ONE TOUCH ULTRA 2 w/Device Kit TEST THREE TIMES A DAY   OneTouch Delica Plus Lancet33G Misc USE AS DIRECTED THREE TIMES A DAY   OneTouch Ultra test strip Generic drug: glucose blood CHECK GLUCOSE ONCE DAILY   glucose blood test strip Use as instructed   glucose blood test strip Use as instructed   rosuvastatin  10 MG tablet Commonly known as: CRESTOR  Take 1 tablet (10 mg total) by mouth daily.   spironolactone 25 MG tablet Commonly known as: ALDACTONE Take 1 tablet (25 mg total) by mouth daily. Start taking on: December 23, 2023   Trulicity  0.75 MG/0.5ML Soaj Generic drug: Dulaglutide  Inject 0.75 mg into the skin once a week. What changed:  how much to take additional instructions   Trulicity  3 MG/0.5ML Soaj Generic drug: Dulaglutide  Inject 3 mg as directed once a week. What changed: Another  medication with the same name was changed. Make sure you understand how and when to take each.   Trulicity  4.5 MG/0.5ML Soaj Generic drug: Dulaglutide  Inject 4.5 mg as directed once a week. What changed: Another medication with the same name was changed. Make sure you understand how and when to take each.   Trulicity  1.5 MG/0.5ML Soaj Generic drug: Dulaglutide  Inject 1.5 mg into the skin once a week for 28 days. What changed: Another medication with the same name was changed. Make sure you understand how and when to take each.        Follow-up Information     Alluri, Keller BROCKS, MD. Go in 1 week(s).   Specialty: Cardiology Why: Appointment scheduled for Monday 12/28/23 at 2:15 PM Contact information: [address] [place] [address] [phone]         [facility]. Go in 3 week(s).   Why: [facility] on Tuesday 01/12/24 at 10 AM Contact information: [address] [place], [place] [address] [phone]               Discharge Exam: Fredricka Weights   12/18/23 0902 12/18/23 1949 12/20/23 2000  Weight: 67.1 kg 66.3 kg 72  kg   Cardiovascular:     Rate and Rhythm: Normal rate and regular rhythm.     Pulses: Normal pulses.     Heart sounds: Normal heart sounds.  Pulmonary:     Effort: Pulmonary effort is normal. No respiratory distress.     Breath sounds: Normal breath sounds.  Abdominal:     General: Bowel sounds are normal. There is no distension.     Palpations: Abdomen is soft.     Tenderness: There is no abdominal tenderness.  Musculoskeletal: No edema noted Skin:    General: Skin is warm and dry.  Neurological:     General: No focal deficit present.     Mental Status: Mental status is at baseline.  Condition at discharge: good  The results of significant diagnostics from this hospitalization (including imaging, microbiology, ancillary and laboratory) are listed below for reference.    Imaging Studies: ECHOCARDIOGRAM COMPLETE Result Date: 12/19/2023    ECHOCARDIOGRAM REPORT   Patient Name:   SAFIYYAH VASCONEZ Date of Exam: 12/19/2023 Medical Rec #:  [id number]       Height:       66.0 in Accession #:    [id number]      Weight:       146.2 lb Date of Birth:  [birth year]       BSA:          1.750 m Patient Age:    78 years        BP:           130/73 mmHg Patient Gender: F               HR:           75 bpm. Exam Location:  [facility] Procedure: 2D Echo, Cardiac Doppler, Color Doppler and Intracardiac            Opacification Agent (Both Spectral and Color Flow Doppler were            utilized during procedure). Indications:     NSTEMI I21.4  History:         Patient has prior history of Echocardiogram examinations, most                  recent 04/04/2016.  Sonographer:     Thedora Louder RDCS, FASE Referring Phys:  [id number] MARSA NOVAK MELVIN Diagnosing Phys: Keller Paterson  Sonographer Comments: Technically difficult study due to poor echo windows. Image acquisition challenging due to respiratory motion. IMPRESSIONS  1. Left ventricular ejection fraction, by estimation, is 25 to 30%. The left ventricle has severely decreased function. The left ventricle demonstrates regional wall motion abnormalities (see scoring diagram/findings for description). Left ventricular diastolic parameters are consistent with Grade I diastolic dysfunction (impaired relaxation).  2. Right ventricular systolic function is normal. The right ventricular size is normal.  3. The mitral valve is normal in structure. Trivial mitral valve regurgitation.  4. The aortic valve was not well visualized. Aortic valve regurgitation is not visualized.  5. The inferior vena cava is normal in size with greater than 50% respiratory variability, suggesting right atrial pressure of 3 mmHg. FINDINGS  Left Ventricle: Left ventricular ejection fraction, by estimation, is 25 to 30%. The left ventricle has severely decreased function. The left  ventricle demonstrates regional wall motion abnormalities. Definity contrast agent was given IV to delineate the left ventricular endocardial borders. The left ventricular internal cavity size was normal in size. There is no left ventricular hypertrophy. Left ventricular  diastolic parameters are consistent with Grade I diastolic dysfunction (impaired relaxation).  LV Wall Scoring: The mid and distal anterior wall, mid and distal anterior septum, entire apex, and mid inferoseptal segment are akinetic. The basal anteroseptal segment, mid anterolateral segment, mid inferior segment, and basal inferoseptal segment are hypokinetic. The posterior wall, basal anterolateral segment, basal anterior segment, and basal inferior segment are normal. Right Ventricle: The right ventricular size is normal. No increase in right ventricular wall thickness. Right ventricular systolic function is normal. Left Atrium: Left atrial size was normal in size. Right Atrium: Right atrial size was normal in size. Pericardium: There is no evidence of pericardial effusion. Mitral Valve: The mitral valve is normal in structure. Trivial mitral valve regurgitation. Tricuspid Valve: The tricuspid valve is normal in structure. Tricuspid valve regurgitation is trivial. Aortic Valve: The aortic valve was not well visualized. Aortic valve regurgitation is not visualized. Aortic valve peak gradient measures 3.0 mmHg. Pulmonic Valve: The pulmonic valve was not well visualized. Pulmonic valve regurgitation is not visualized. Aorta: The aortic root and ascending aorta are structurally normal, with no evidence of dilitation. Venous: The inferior vena cava is normal in size with greater than 50% respiratory variability, suggesting right atrial pressure of 3 mmHg. IAS/Shunts: The atrial septum is grossly normal.  LEFT VENTRICLE PLAX 2D LVIDd:         5.20 cm     Diastology LVIDs:         4.40 cm     LV e' medial:    3.81 cm/s LV PW:         0.90 cm     LV E/e'  medial:  22.4 LV IVS:        0.90 cm     LV e' lateral:   7.07 cm/s LVOT diam:     2.10 cm     LV E/e' lateral: 12.1 LV SV:         42 LV SV Index:   24 LVOT Area:     3.46 cm  LV Volumes (MOD) LV vol d, MOD A2C: 42.0 ml LV vol d, MOD A4C: 98.7 ml LV vol s, MOD A2C: 30.6 ml LV vol s, MOD A4C: 64.1 ml LV SV MOD A2C:     11.4 ml LV SV MOD A4C:     98.7 ml LV SV MOD BP:      21.8 ml RIGHT VENTRICLE RV Basal diam:  2.60 cm RV S prime:     13.80 cm/s LEFT ATRIUM             Index        RIGHT ATRIUM          Index LA diam:        3.00 cm 1.71 cm/m   RA Area:     6.79 cm LA Vol (A2C):   42.1 ml 24.05 ml/m  RA Volume:   12.10 ml 6.91 ml/m LA Vol (A4C):   22.2 ml 12.68 ml/m LA Biplane Vol: 33.1 ml 18.91 ml/m  AORTIC VALVE AV Area (Vmax): 2.58 cm AV Vmax:        87.00 cm/s AV Peak Grad:   3.0 mmHg LVOT Vmax:      64.70 cm/s LVOT Vmean:     41.400 cm/s LVOT VTI:       0.122 m  AORTA Ao Root diam: 3.20 cm MITRAL VALVE MV Area (PHT): 4.89 cm    SHUNTS MV Decel Time: 155 msec    Systemic  VTI:  0.12 m MV E velocity: 85.40 cm/s  Systemic Diam: 2.10 cm MV A velocity: 34.10 cm/s MV E/A ratio:  2.50 Keller Paterson Electronically signed by Keller Paterson Signature Date/Time: 12/19/2023/11:57:02 AM    Final    CT Angio Chest PE W and/or Wo Contrast Result Date: 12/18/2023 CLINICAL DATA:  Evaluate pulmonary embolus. Right back pain. Elevated trops. Dizziness and nausea with pain in the upper back radiating to the both shoulders. EXAM: CT ANGIOGRAPHY CHEST WITH CONTRAST TECHNIQUE: Multidetector CT imaging of the chest was performed using the standard protocol during bolus administration of intravenous contrast. Multiplanar CT image reconstructions and MIPs were obtained to evaluate the vascular anatomy. RADIATION DOSE REDUCTION: This exam was performed according to the departmental dose-optimization program which includes automated exposure control, adjustment of the mA and/or kV according to patient size and/or use of  iterative reconstruction technique. CONTRAST:  75mL OMNIPAQUE  IOHEXOL  350 MG/ML SOLN COMPARISON:  Chest radiograph 12/18/2023. CT chest abdomen and pelvis 10/06/2023 FINDINGS: Cardiovascular: Good opacification of the central and segmental pulmonary arteries. No focal filling defects. No evidence of significant pulmonary embolus. Normal heart size. No pericardial effusions. Normal caliber thoracic aorta. Calcification in the aorta and coronary arteries. Mediastinum/Nodes: No enlarged mediastinal, hilar, or axillary lymph nodes. Thyroid  gland, trachea, and esophagus demonstrate no significant findings. Lungs/Pleura: 5 mm nodule in the right upper lung, series 6, image 45. 3 mm nodule in the left upper lung, image 40. No change since previous studies. Mild dependent atelectasis in the lungs. No pleural effusion or pneumothorax. Upper Abdomen: No acute abnormalities. Musculoskeletal: Degenerative changes in the spine. Focal sclerosis in the mid sternum. Etiology is nonspecific. This could be healing fracture or developing mass lesion. Consider bone scan correlation. Healing left rib fracture. Review of the MIP images confirms the above findings. IMPRESSION: 1. No evidence of significant pulmonary embolus. 2. Bilateral pulmonary nodules, largest measuring 5 mm. No change since prior study. No follow-up needed if patient is low-risk.This recommendation follows the consensus statement: Guidelines for Management of Incidental Pulmonary Nodules Detected on CT Images: From the Fleischner Society 2017; Radiology 2017; 284:228-243. 3. Focal sclerosis demonstrated in the mid sternum with nonspecific etiology. Consider bone scan correlation. Healing left rib fracture. Electronically Signed   By: Elsie Gravely M.D.   On: 12/18/2023 16:02   CT ABDOMEN PELVIS W CONTRAST Result Date: 12/18/2023 EXAM: CT ABDOMEN AND PELVIS WITH CONTRAST 12/18/2023 10:27:07 AM TECHNIQUE: CT of the abdomen and pelvis was performed with the  administration of 100 mL of iohexol  (OMNIPAQUE ) 300 MG/ML solution. Multiplanar reformatted images are provided for review. Automated exposure control, iterative reconstruction, and/or weight-based adjustment of the mA/kV was utilized to reduce the radiation dose to as low as reasonably achievable. COMPARISON: 10/06/2023 CLINICAL HISTORY: Right CVA pain and TTP, emesis. Dizziness, nausea, and upper back pain radiating to shoulders. Ongoing since Tuesday. FINDINGS: LOWER CHEST: Healing fractures of the left ninth, tenth, and 11th ribs laterally. Descending thoracic aortic atheromatous vascular calcification. LIVER: The liver is unremarkable. GALLBLADDER AND BILE DUCTS: Gallbladder is unremarkable. No biliary ductal dilatation. SPLEEN: No acute abnormality. PANCREAS: Small cystic lesions along the pancreas, largest measuring 1.1 x 1.1 x 1.1 cm, unchanged. Based on patient age and lesion size, follow up pancreatic protocol MRI or CT is recommended in 2 years' time. ADRENAL GLANDS: No acute abnormality. KIDNEYS, URETERS AND BLADDER: Small simple bilateral renal cysts warrant no further imaging workup. Stable mild scarring in the right mid kidney laterally not substantially changed from previous.  No stones in the kidneys or ureters. No hydronephrosis. No perinephric or periureteral stranding. Urinary bladder is unremarkable. GI AND BOWEL: Periampullary duodenal diverticulum. Sigmoid colon diverticulosis. Stomach demonstrates no acute abnormality. There is no bowel obstruction. PERITONEUM AND RETROPERITONEUM: No ascites. No free air. VASCULATURE: Systemic atherosclerosis is present, including the aorta and iliac arteries. Aorta is normal in caliber. LYMPH NODES: No lymphadenopathy. REPRODUCTIVE ORGANS: Presumed right ovary contains a 1.8 x 1.2 cm simple-appearing cyst. The right ovary parenchyma enhances more than is typically encountered, almost resembling renal enhancement ; pelvic sonography recommended for more  definitive characterization. Prior hysterectomy. BONES AND SOFT TISSUES: Healing fractures of the left ninth, tenth, and 11th ribs laterally. Moderate degenerative hip arthropathy, right greater than left. Small right groin hernia contains adipose tissue. IMPRESSION: 1. No acute abnormalities. 2. Moderate degenerative hip arthropathy, right greater than left. 3. Atherosclerosis, including aortic and iliac arteries. 4. 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 5. Simple right ovarian cyst measuring 1.8 x 1.2 cm, with unusual enhancement of the right ovary ; recommend pelvic sonography. 6. Small simple bilateral renal cysts. 7. Periampullary duodenal diverticulum and sigmoid diverticulosis. 8. Small right groin fat-containing hernia. 9. Healing fractures of the left ninth, tenth, and 11th ribs. Electronically signed by: Ryan Salvage MD 12/18/2023 11:07 AM EDT RP Workstation: [id number]   DG Chest 2 View Result Date: 12/18/2023 EXAM: 2 VIEW(S) XRAY OF THE CHEST 12/18/2023 10:16:00 AM COMPARISON: 10/06/2023 CLINICAL HISTORY: right sided thoracic back pain. Patient to ED via POV for dizziness and nausea with pain in upper back that radiates into bilateral shoulders. Ongoing since Tuesday. FINDINGS: LUNGS AND PLEURA: No focal pulmonary opacity. No pulmonary edema. No pleural effusion. No pneumothorax. HEART AND MEDIASTINUM: Mild-to-moderate atherosclerotic calcifications in aortic arch. No acute abnormality of the cardiac and mediastinal silhouettes. BONES AND SOFT TISSUES: Mild multilevel degenerative disc changes of thoracic spine. No acute osseous abnormality. IMPRESSION: 1. No acute cardiopulmonary abnormality detected. 2. Atherosclerotic calcifications in the aortic arch. Electronically signed by: Waddell Calk MD 12/18/2023 10:30 AM EDT RP Workstation: [id number]    Microbiology: Results for orders placed or performed in visit on 04/23/22  Microscopic  Examination     Status: None   Collection Time: 04/23/22 10:12 AM   BLD  Result Value Ref Range Status   WBC, UA 0-5 0 - 5 /hpf Final   RBC, Urine 0-2 0 - 2 /hpf Final   Epithelial Cells (non renal) 0-10 0 - 10 /hpf Final   Bacteria, UA None seen None seen/Few Final    Labs: CBC: Recent Labs  Lab 12/18/23 0916 12/19/23 0411 12/20/23 0458 12/21/23 0311 12/22/23 0529  WBC 6.3 4.6 5.2 5.3 5.5  NEUTROABS  --   --  3.0 3.0 3.6  HGB 11.2* 10.1* 10.7* 10.0* 9.5*  HCT 32.3* 28.7* 31.0* 28.7* 27.0*  MCV 84.8 84.7 83.8 85.2 84.6  PLT 213 169 176 172 172   Basic Metabolic Panel: Recent Labs  Lab 12/18/23 0916 12/19/23 0411 12/20/23 0458 12/21/23 0311 12/22/23 0529  NA 131* 129* 128* 130* 128*  K 4.2 3.9 3.7 3.9 3.9  CL 99 97* 95* 98 96*  CO2 22 23 25 24 25   GLUCOSE 153* 129* 128* 145* 139*  BUN 20 16 20 21 23   CREATININE 0.94 1.10* 1.12* 1.12* 1.17*  CALCIUM  9.2 8.9 8.5* 8.7* 8.6*  MG  --  1.5*  --   --   --  Liver Function Tests: Recent Labs  Lab 12/18/23 0916 12/19/23 0411  AST 14* 14*  ALT 10 8  ALKPHOS 70 63  BILITOT 0.7 0.8  PROT 6.8 6.3*  ALBUMIN 3.8 3.6   CBG: Recent Labs  Lab 12/21/23 1204 12/21/23 1341 12/21/23 2041 12/22/23 0748 12/22/23 1126  GLUCAP 137* 134* 151* 143* 194*    Discharge time spent:  38 minutes.  Signed: Drue ONEIDA Potter, MD [facility] 12/22/2023

## 2023-12-22 NOTE — Progress Notes (Signed)
 Heart Failure Stewardship Pharmacy Note  PCP: Vicci Duwaine SQUIBB, DO PCP-Cardiologist: None  HPI: Alison Hernandez is a 78 y.o. female with hypertension, hyperlipidemia, diabetes type 2, anemia who presented with back pain and nausea. On admission, BNP was not measured, HS-troponin was 21 >393 > 820, and lipase 61. Chest x-ray noted no acute abnormality.   Pertinent cardiac history: TTE 03/2016 with normal LVEF. TTE 12/19/23 showed LVEF down to 25-30%, G1DD, trivial MR.   Pertinent Lab Values: Creatinine, Ser  Date Value Ref Range Status  12/22/2023 1.17 (H) 0.44 - 1.00 mg/dL Final   BUN  Date Value Ref Range Status  12/22/2023 23 8 - 23 mg/dL Final  93/79/7974 31 (H) 8 - 27 mg/dL Final   Potassium  Date Value Ref Range Status  12/22/2023 3.9 3.5 - 5.1 mmol/L Final   Sodium  Date Value Ref Range Status  12/22/2023 128 (L) 135 - 145 mmol/L Final  09/11/2023 134 134 - 144 mmol/L Final   BNP  Date Value Ref Range Status  02/01/2016 CANCELED pg/mL     Comment:    Testing of the specimen submitted could not be completed due to a specimen identification problem.  No Patient Identification on Container. CONTACTED TIFFANY R AT YOUR FACILITY 02/05/2016  Result canceled by the ancillary    Magnesium  Date Value Ref Range Status  12/19/2023 1.5 (L) 1.7 - 2.4 mg/dL Final    Comment:    Performed at [facility], [address] [place], [place] [address]   Hemoglobin A1C  Date Value Ref Range Status  11/28/2015 7.5  Final   HB A1C (BAYER DCA - WAIVED)  Date Value Ref Range Status  11/25/2023 7.3 (H) 4.8 - 5.6 % Final    Comment:             Prediabetes: 5.7 - 6.4          Diabetes: >6.4          Glycemic control for adults with diabetes: <7.0    TSH  Date Value Ref Range Status  09/11/2023 2.500 0.450 - 4.500 uIU/mL Final    Vital Signs:  Temp:  [97.4 F (36.3 C)-98.6 F (37 C)] 98.6 F (37 C) (09/30 0747) Pulse Rate:  [62-87] 67 (09/30 0747) Cardiac  Rhythm: Normal sinus rhythm (09/30 0800) Resp:  [16-30] 19 (09/29 2329) BP: (92-159)/(42-82) 113/59 (09/30 0747) SpO2:  [95 %-100 %] 100 % (09/30 0747)  Intake/Output Summary (Last 24 hours) at 12/22/2023 0934 Last data filed at 12/21/2023 1936 Gross per 24 hour  Intake 490 ml  Output --  Net 490 ml    Current Heart Failure Medications:  Loop diuretic: none Beta-Blocker: metoprolol succinate 25 mg daily ACEI/ARB/ARNI: lisinopril  40 mg daily MRA: none SGLT2i: none Other: none  Prior to admission Heart Failure Medications:  Loop diuretic: none Beta-Blocker: none ACEI/ARB/ARNI: lisinopril  40 mg daily MRA: none SGLT2i: none Other: none  Assessment: 1. Acute systolic heart failure (LVEF 25-30%) with G1DD, due to unknown etiology. NYHA class I-II symptoms.  -Symptoms: Reports back pain resolved. Denies shortness of breath and LEE. Back to baseline.  -Volume: Appears euvolemic. No requirement for diuretics.  -Hemodynamics: BP normal. HR 60s. -BB: Continue metoprolol succinate 25 mg daily. Does not appear low output, can titrate as HR allows.  -ACEI/ARB/ARNI: Currently on lisinopril  40 mg daily. Given stress induced cardiomyopathy, lack of symptoms, and Entresto cost, would continue lisinopril  at this time. -MRA: Adding spironolactone 25 mg daily   -  SGLT2i: Consider at clinic follow up  Plan: 1) Medication changes recommended at this time: -Spironolactone 25 mg daily added  2) Patient assistance: -Jardiance  is the preferred SGLT2i by cost, copay $47  3) Education: - Patient has been educated on current HF medications and potential additions to HF medication regimen - Patient verbalizes understanding that over the next few months, these medication doses may change and more medications may be added to optimize HF regimen - Patient has been educated on basic disease state pathophysiology and goals of therapy  Please do not hesitate to reach out with questions or  concerns,  Jaun Bash, PharmD, CPP, BCPS, [facility] Heart Failure Pharmacist  Phone - [phone] 12/22/2023 9:34 AM

## 2023-12-22 NOTE — Progress Notes (Signed)
 [facility] CLINIC CARDIOLOGY PROGRESS NOTE   Patient ID: Alison Hernandez MRN: [id number] DOB/AGE: 1945/06/08 78 y.o.  Admit date: 12/18/2023 Referring Physician Dr. Dorinda Primary Physician Vicci Duwaine SQUIBB, DO  Primary Cardiologist None Reason for Consultation NSTEMI  HPI: Alison Hernandez is a 78 y.o. female with a past medical history of hypertension, hyperlipidemia, diabetes type 2, anemia  who presented to the ED on 12/18/2023 for back pain and nausea. Troponins found to be elevated and cardiology was consulted for further evaluation.   Interval History:  - Patient seen and examined this morning, sitting upright in bed with daughter at bedside. - Feeling well today with no complaints.  - BP and heart rate remain controlled today.  Renal function stable. - LHC results and future plan for her heart failure discussed in detail this AM.  Review of systems complete and found to be negative unless listed above   Vitals:   12/21/23 1950 12/21/23 2329 12/22/23 0441 12/22/23 0747  BP: 97/62 (!) 98/55 119/60 (!) 113/59  Pulse: 67 66 63 67  Resp: 16 19    Temp: 98.1 F (36.7 C) 98.1 F (36.7 C) 98.2 F (36.8 C) 98.6 F (37 C)  TempSrc: Oral Oral  Oral  SpO2: 99% 97% 100% 100%  Weight:      Height:         Intake/Output Summary (Last 24 hours) at 12/22/2023 9178 Last data filed at 12/21/2023 1936 Gross per 24 hour  Intake 490 ml  Output --  Net 490 ml     PHYSICAL EXAM General: Well-appearing elderly female, well nourished, in no acute distress. HEENT: Normocephalic and atraumatic. Neck: No JVD.  Lungs: Normal respiratory effort on room air. Clear bilaterally to auscultation. No wheezes, crackles, rhonchi.  Heart: HRRR. Normal S1 and S2 without gallops or murmurs. Radial & DP pulses 2+ bilaterally. Abdomen: Non-distended appearing.  Msk: Normal strength and tone for age. Extremities: No clubbing, cyanosis or edema.   Neuro: Alert and oriented X 3. Psych: Mood appropriate,  affect congruent.    LABS: Basic Metabolic Panel: Recent Labs    12/21/23 0311 12/22/23 0529  NA 130* 128*  K 3.9 3.9  CL 98 96*  CO2 24 25  GLUCOSE 145* 139*  BUN 21 23  CREATININE 1.12* 1.17*  CALCIUM  8.7* 8.6*   Liver Function Tests: No results for input(s): AST, ALT, ALKPHOS, BILITOT, PROT, ALBUMIN in the last 72 hours.  No results for input(s): LIPASE, AMYLASE in the last 72 hours. CBC: Recent Labs    12/21/23 0311 12/22/23 0529  WBC 5.3 5.5  NEUTROABS 3.0 3.6  HGB 10.0* 9.5*  HCT 28.7* 27.0*  MCV 85.2 84.6  PLT 172 172   Cardiac Enzymes: No results for input(s): CKTOTAL, CKMB, CKMBINDEX, TROPONINIHS in the last 72 hours.  BNP: No results for input(s): BNP in the last 72 hours. D-Dimer: No results for input(s): DDIMER in the last 72 hours. Hemoglobin A1C: No results for input(s): HGBA1C in the last 72 hours. Fasting Lipid Panel: No results for input(s): CHOL, HDL, LDLCALC, TRIG, CHOLHDL, LDLDIRECT in the last 72 hours.  Thyroid  Function Tests: No results for input(s): TSH, T4TOTAL, T3FREE, THYROIDAB in the last 72 hours.  Invalid input(s): FREET3 Anemia Panel: No results for input(s): VITAMINB12, FOLATE, FERRITIN, TIBC, IRON , RETICCTPCT in the last 72 hours.  No results found.    ECHO as above  TELEMETRY reviewed by me 12/22/23: Sinus rhythm rate 60s, PACs  EKG reviewed by me 12/22/23: Sinus  rhythm with left bundle branch block rate 73 bpm  DATA reviewed by me 12/22/23: last 24h vitals tele labs imaging I/O, hospitalist progress note  Principal Problem:   NSTEMI (non-ST elevated myocardial infarction) (HCC) Active Problems:   Diabetes (HCC)   Benign hypertensive renal disease   Hyperlipidemia   Depression   Anemia    ASSESSMENT AND PLAN: Alison Hernandez is a 78 y.o. female with a past medical history of hypertension, hyperlipidemia, diabetes type 2, anemia  who presented to  the ED on 12/18/2023 for back pain and nausea. Troponins found to be elevated and cardiology was consulted for further evaluation.   # NSTEMI, type I vs type II # New HFrEF, cardiomyopathy (stress-induced) # Coronary artery disease by CT # Hypertension # Hyperlipidemia Patient initially presented with back pain, nausea.  Troponins found to be elevated and trended 21 > 393 > 820 > 567. Echo with EF 25-30%, multiple wall motion abnormalities as described in echo report above, grade 1 diastolic dysfunction. 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 - Start spironolactone 25 mg daily. Continue metoprolol succinate 25 mg daily, lisinopril  40 mg daily.  Plan to add jardiance  at follow up appointment (cost $47/mo - Doreen more expensive). - Continue aspirin  81 mg daily, Crestor  10 mg daily.   Ok for discharge today from a cardiac perspective. Follow up scheduled with Dr. Wilburn on 10/6 and [facility] heart failure clinic on 10/21.  This patient's case was discussed and created with Dr. Florencio and he is in agreement.  Signed:  Danita Bloch, PA-C  12/22/2023, 8:21 AM [facility] Cardiology

## 2023-12-23 ENCOUNTER — Encounter: Payer: Self-pay | Admitting: Internal Medicine

## 2023-12-28 DIAGNOSIS — A419 Sepsis, unspecified organism: Secondary | ICD-10-CM | POA: Diagnosis not present

## 2023-12-28 DIAGNOSIS — E782 Mixed hyperlipidemia: Secondary | ICD-10-CM | POA: Diagnosis not present

## 2023-12-28 DIAGNOSIS — I5181 Takotsubo syndrome: Secondary | ICD-10-CM | POA: Diagnosis not present

## 2023-12-28 DIAGNOSIS — E119 Type 2 diabetes mellitus without complications: Secondary | ICD-10-CM | POA: Diagnosis not present

## 2023-12-28 DIAGNOSIS — N10 Acute tubulo-interstitial nephritis: Secondary | ICD-10-CM | POA: Diagnosis not present

## 2023-12-28 DIAGNOSIS — I10 Essential (primary) hypertension: Secondary | ICD-10-CM | POA: Diagnosis not present

## 2023-12-28 DIAGNOSIS — N186 End stage renal disease: Secondary | ICD-10-CM | POA: Diagnosis not present

## 2023-12-28 DIAGNOSIS — I2489 Other forms of acute ischemic heart disease: Secondary | ICD-10-CM | POA: Diagnosis not present

## 2023-12-29 NOTE — Progress Notes (Signed)
 Alison Hernandez                                          MRN: [id number]   12/29/2023   The VBCI Quality Team Specialist reviewed this patient medical record for the purposes of chart review for care gap closure. The following were reviewed: abstraction for care gap closure-kidney health evaluation for diabetes:eGFR  and uACR.    VBCI Quality Team

## 2024-01-12 DIAGNOSIS — I5181 Takotsubo syndrome: Secondary | ICD-10-CM | POA: Diagnosis not present

## 2024-01-18 ENCOUNTER — Other Ambulatory Visit: Payer: Self-pay | Admitting: Family Medicine

## 2024-01-18 ENCOUNTER — Other Ambulatory Visit: Payer: Self-pay

## 2024-01-19 ENCOUNTER — Other Ambulatory Visit: Payer: Self-pay

## 2024-01-19 NOTE — Telephone Encounter (Signed)
 Refill requested too soon. Requested Prescriptions  Refused Prescriptions Disp Refills   metoprolol succinate (TOPROL-XL) 25 MG 24 hr tablet 30 tablet 1    Sig: Take 1 tablet (25 mg total) by mouth daily.     Cardiovascular:  Beta Blockers Passed - 01/19/2024  5:45 PM      Passed - Last BP in normal range    BP Readings from Last 1 Encounters:  12/22/23 138/66         Passed - Last Heart Rate in normal range    Pulse Readings from Last 1 Encounters:  12/22/23 67         Passed - Valid encounter within last 6 months    Recent Outpatient Visits           1 month ago Type 2 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 disease, without long-term current use of insulin  (HCC)   [place] [facility] [place], Megan P, DO   3 months ago Type 2 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 disease, without long-term current use of insulin  (HCC)   [place] [facility] [place], Megan P, DO   4 months ago Dizziness   [place] [facility] [place], Megan P, DO   5 months ago Type 2 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 disease, without long-term current use of insulin  (HCC)   [place] [facility] [place], Megan P, DO               spironolactone (ALDACTONE) 25 MG tablet 30 tablet 1    Sig: Take 1 tablet (25 mg total) by mouth daily.     Cardiovascular: Diuretics - Aldosterone Antagonist Failed - 01/19/2024  5:45 PM      Failed - Cr in normal range and within 180 days    Creatinine, Ser  Date Value Ref Range Status  12/22/2023 1.17 (H) 0.44 - 1.00 mg/dL Final         Failed - Na in normal range and within 180 days    Sodium  Date Value Ref Range Status  12/22/2023 128 (L) 135 - 145 mmol/L Final  09/11/2023 134 134 - 144 mmol/L Final         Passed - K in normal range and within 180 days    Potassium  Date Value Ref Range Status  12/22/2023 3.9 3.5 - 5.1 mmol/L Final         Passed - eGFR is 30 or  above and within 180 days    GFR calc Af Amer  Date Value Ref Range Status  04/04/2020 48 (L) >59 mL/min/1.73 Final    Comment:    **In accordance with recommendations from the NKF-ASN Task force,**   [facility] is in the process of updating its eGFR calculation to the   2021 CKD-EPI creatinine equation that estimates kidney function   without a race variable.    GFR, Estimated  Date Value Ref Range Status  12/22/2023 48 (L) >60 mL/min Final    Comment:    (NOTE) Calculated using the CKD-EPI Creatinine Equation (2021)    eGFR  Date Value Ref Range Status  09/11/2023 49 (L) >59 mL/min/1.73 Final         Passed - Last BP in normal range    BP Readings from Last 1 Encounters:  12/22/23 138/66         Passed - Valid encounter within last 6 months    Recent Outpatient Visits  1 month ago Type 2 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 disease, without long-term current use of insulin  (HCC)   [place] [facility] [place], Megan P, DO   3 months ago Type 2 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 disease, without long-term current use of insulin  (HCC)   [place] [facility] [place], Megan P, DO   4 months ago Dizziness   [place] [facility] [place], Megan P, DO   5 months ago Type 2 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 disease, without long-term current use of insulin  [facility])   [place] [facility] [place], Megan P, DO

## 2024-02-11 ENCOUNTER — Telehealth: Payer: Self-pay

## 2024-02-11 NOTE — Telephone Encounter (Signed)
 Gave pt a call pt is due for re-enrollment on [employer]  left a HIPAA VM.

## 2024-02-16 ENCOUNTER — Other Ambulatory Visit: Payer: Self-pay

## 2024-02-17 ENCOUNTER — Other Ambulatory Visit: Payer: Self-pay | Admitting: Family Medicine

## 2024-02-17 ENCOUNTER — Other Ambulatory Visit: Payer: Self-pay

## 2024-02-19 ENCOUNTER — Other Ambulatory Visit: Payer: Self-pay

## 2024-02-19 ENCOUNTER — Other Ambulatory Visit: Payer: Self-pay | Admitting: Family Medicine

## 2024-02-20 ENCOUNTER — Other Ambulatory Visit: Payer: Self-pay | Admitting: Family Medicine

## 2024-02-20 ENCOUNTER — Other Ambulatory Visit: Payer: Self-pay

## 2024-02-22 ENCOUNTER — Other Ambulatory Visit: Payer: Self-pay | Admitting: Family Medicine

## 2024-02-22 ENCOUNTER — Other Ambulatory Visit: Payer: Self-pay

## 2024-02-23 ENCOUNTER — Other Ambulatory Visit: Payer: Self-pay

## 2024-02-23 MED ORDER — METOPROLOL SUCCINATE ER 25 MG PO TB24
25.0000 mg | ORAL_TABLET | Freq: Every day | ORAL | 0 refills | Status: DC
  Filled 2024-02-23: qty 90, 90d supply, fill #0

## 2024-02-23 MED ORDER — SPIRONOLACTONE 25 MG PO TABS
25.0000 mg | ORAL_TABLET | Freq: Every day | ORAL | 0 refills | Status: DC
  Filled 2024-02-23: qty 90, 90d supply, fill #0

## 2024-02-24 NOTE — Telephone Encounter (Signed)
 Requested Prescriptions  Pending Prescriptions Disp Refills   omeprazole  (PRILOSEC) 20 MG capsule [Pharmacy Med Name: Omeprazole  20 MG Oral Capsule Delayed Release] 180 capsule 0    Sig: TAKE 1 CAPSULE BY MOUTH TWICE DAILY BEFORE A MEAL     Gastroenterology: Proton Pump Inhibitors Passed - 02/24/2024  9:35 AM      Passed - Valid encounter within last 12 months    Recent Outpatient Visits           3 months ago Type 2 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 disease, without long-term current use of insulin  (HCC)   [place] [facility] [place], Megan P, DO   4 months ago Type 2 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 disease, without long-term current use of insulin  (HCC)   [place] [facility] [place], Megan P, DO   5 months ago Dizziness   [place] [facility] [place], Megan P, DO   6 months ago Type 2 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 disease, without long-term current use of insulin  (HCC)   [place] [facility] [place], Megan P, DO               rosuvastatin  (CRESTOR ) 10 MG tablet [Pharmacy Med Name: Rosuvastatin  Calcium  10 MG Oral Tablet] 90 tablet 0    Sig: Take 1 tablet by mouth once daily     Cardiovascular:  Antilipid - Statins 2 Failed - 02/24/2024  9:35 AM      Failed - Cr in normal range and within 360 days    Creatinine, Ser  Date Value Ref Range Status  12/22/2023 1.17 (H) 0.44 - 1.00 mg/dL Final         Failed - Lipid Panel in normal range within the last 12 months    Cholesterol, Total  Date Value Ref Range Status  08/20/2023 212 (H) 100 - 199 mg/dL Final   Cholesterol  Date Value Ref Range Status  12/19/2023 130 0 - 200 mg/dL Final   Cholesterol [place]  Date Value Ref Range Status  10/25/2015 228 (H) <200 mg/dL Final    Comment:                            Desirable                <200                         Borderline High      200- 239                          High                     >239    LDL Chol Calc (NIH)  Date Value Ref Range Status  08/20/2023 119 (H) 0 - 99 mg/dL Final   LDL Cholesterol  Date Value Ref Range Status  12/19/2023 55 0 - 99 mg/dL Final    Comment:           Total Cholesterol/HDL:CHD Risk Coronary Heart Disease Risk Table                     Men   Women  1/2 Average Risk   3.4   3.3  Average Risk       5.0  4.4  2 X Average Risk   9.6   7.1  3 X Average Risk  23.4   11.0        Use the calculated Patient Ratio above and the CHD Risk Table to determine the patient's CHD Risk.        ATP III CLASSIFICATION (LDL):  <100     mg/dL   Optimal  899-870  mg/dL   Near or Above                    Optimal  130-159  mg/dL   Borderline  839-810  mg/dL   High  >809     mg/dL   Very High Performed at [facility], [address] [place], [place] [address]    HDL  Date Value Ref Range Status  12/19/2023 59 >40 mg/dL Final  94/70/7974 70 >60 mg/dL Final   Triglycerides  Date Value Ref Range Status  12/19/2023 82 <150 mg/dL Final   Triglycerides Piccolo,Waived  Date Value Ref Range Status  10/25/2015 162 (H) <150 mg/dL Final    Comment:                            Normal                   <150                         Borderline High     150 - 199                         High                200 - 499                         Very High                >499          Passed - Patient is not pregnant      Passed - Valid encounter within last 12 months    Recent Outpatient Visits           3 months ago Type 2 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 disease, without long-term current use of insulin  (HCC)   [place] [facility] [place], Megan P, DO   4 months ago Type 2 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 disease, without long-term current use of insulin  (HCC)   [place] [facility] [place], Megan P, DO   5 months ago Dizziness   [place] [facility] [place], [place] P, DO   6 months ago Type 2 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 disease, without long-term current use of insulin  [facility])   [place] [facility] [place], [place], DO

## 2024-02-25 ENCOUNTER — Encounter: Payer: Self-pay | Admitting: Family Medicine

## 2024-02-25 ENCOUNTER — Ambulatory Visit: Admitting: Family Medicine

## 2024-02-25 VITALS — BP 129/77 | HR 79 | Temp 98.0°F | Ht 66.0 in | Wt 162.2 lb

## 2024-02-25 DIAGNOSIS — E782 Mixed hyperlipidemia: Secondary | ICD-10-CM

## 2024-02-25 DIAGNOSIS — D692 Other nonthrombocytopenic purpura: Secondary | ICD-10-CM

## 2024-02-25 DIAGNOSIS — E1122 Type 2 diabetes mellitus with diabetic chronic kidney disease: Secondary | ICD-10-CM

## 2024-02-25 DIAGNOSIS — I5181 Takotsubo syndrome: Secondary | ICD-10-CM | POA: Insufficient documentation

## 2024-02-25 DIAGNOSIS — Z0000 Encounter for general adult medical examination without abnormal findings: Secondary | ICD-10-CM

## 2024-02-25 DIAGNOSIS — Z780 Asymptomatic menopausal state: Secondary | ICD-10-CM

## 2024-02-25 DIAGNOSIS — Z23 Encounter for immunization: Secondary | ICD-10-CM

## 2024-02-25 DIAGNOSIS — M810 Age-related osteoporosis without current pathological fracture: Secondary | ICD-10-CM

## 2024-02-25 DIAGNOSIS — I129 Hypertensive chronic kidney disease with stage 1 through stage 4 chronic kidney disease, or unspecified chronic kidney disease: Secondary | ICD-10-CM

## 2024-02-25 DIAGNOSIS — Z1231 Encounter for screening mammogram for malignant neoplasm of breast: Secondary | ICD-10-CM

## 2024-02-25 DIAGNOSIS — N182 Chronic kidney disease, stage 2 (mild): Secondary | ICD-10-CM | POA: Diagnosis not present

## 2024-02-25 DIAGNOSIS — F3341 Major depressive disorder, recurrent, in partial remission: Secondary | ICD-10-CM

## 2024-02-25 DIAGNOSIS — I2510 Atherosclerotic heart disease of native coronary artery without angina pectoris: Secondary | ICD-10-CM | POA: Insufficient documentation

## 2024-02-25 LAB — BAYER DCA HB A1C WAIVED: HB A1C (BAYER DCA - WAIVED): 6.9 % — ABNORMAL HIGH (ref 4.8–5.6)

## 2024-02-25 MED ORDER — ALENDRONATE SODIUM 70 MG PO TABS: 70.0000 mg | ORAL_TABLET | ORAL | 3 refills | Status: AC

## 2024-02-25 MED ORDER — BUPROPION HCL ER (XL) 150 MG PO TB24: 150.0000 mg | ORAL_TABLET | Freq: Every morning | ORAL | 1 refills | Status: AC

## 2024-02-25 MED ORDER — SPIRONOLACTONE 25 MG PO TABS: 25.0000 mg | ORAL_TABLET | Freq: Every day | ORAL | 1 refills | Status: AC

## 2024-02-25 MED ORDER — MECLIZINE HCL 25 MG PO TABS: 25.0000 mg | ORAL_TABLET | Freq: Three times a day (TID) | ORAL | 6 refills | Status: AC | PRN

## 2024-02-25 MED ORDER — LISINOPRIL 40 MG PO TABS: 40.0000 mg | ORAL_TABLET | Freq: Every day | ORAL | 1 refills | Status: AC

## 2024-02-25 MED ORDER — ONDANSETRON 8 MG PO TBDP: ORAL_TABLET | ORAL | 10 refills | Status: AC

## 2024-02-25 MED ORDER — METOPROLOL SUCCINATE ER 25 MG PO TB24: 25.0000 mg | ORAL_TABLET | Freq: Every day | ORAL | 1 refills | Status: AC

## 2024-02-25 NOTE — Assessment & Plan Note (Signed)
 Reassured patient.

## 2024-02-25 NOTE — Assessment & Plan Note (Signed)
 Due for recheck in February. Await results. Treat as needed.

## 2024-02-25 NOTE — Assessment & Plan Note (Signed)
 A1c doing great at 6.9. Continue current regimen. Call with any concerns. Refills given. Labs drawn today.

## 2024-02-25 NOTE — Assessment & Plan Note (Signed)
 Under good control on current regimen. Continue current regimen. Continue to monitor. Call with any concerns. Refills given. Labs drawn today.

## 2024-02-25 NOTE — Assessment & Plan Note (Signed)
 Continue to follow with cardiology. Call with any concerns. Continue to monitor.

## 2024-02-25 NOTE — Patient Instructions (Signed)
 Please call to schedule your mammogram and/or bone density: [facility] at [facility]  Address: [address], [place], [place] [address] Phone: [phone]  [facility] [address] [place],  [place]  [address] Phone: [phone]

## 2024-02-25 NOTE — Progress Notes (Signed)
 BP 129/77   Pulse 79   Temp 98 F (36.7 C) (Oral)   Ht 5' 6 (1.676 m)   Wt 162 lb 3.2 oz (73.6 kg)   SpO2 100%   BMI 26.18 kg/m    Subjective:    Patient ID: Alison Hernandez, female    DOB: Jul 10, 1945, 78 y.o.   MRN: [id number]  HPI: Alison Hernandez is a 78 y.o. female presenting on 02/25/2024 for comprehensive medical examination. Current medical complaints include:  DIABETES Hypoglycemic episodes:no Polydipsia/polyuria: no Visual disturbance: no Chest pain: no Paresthesias: no Glucose Monitoring: yes Taking Insulin ?: no Blood Pressure Monitoring: a few times a month Retinal Examination: Not up to Date Foot Exam: Up to Date Diabetic Education: Completed Pneumovax: Up to Date Influenza: Up to Date Aspirin : yes  HYPERTENSION / HYPERLIPIDEMIA Satisfied with current treatment? yes Duration of hypertension: chronic BP monitoring frequency: not checking BP medication side effects: no Past BP meds: spironalactone, metoprolol . lisinopril  Duration of hyperlipidemia: chronic Cholesterol medication side effects: no Cholesterol supplements: none Past cholesterol medications: crestor  Medication compliance: excellent compliance Aspirin : yes Recent stressors: no Recurrent headaches: no Visual changes: no Palpitations: no Dyspnea: no Chest pain: no Lower extremity edema: no Dizzy/lightheaded: no  DEPRESSION Mood status: controlled Satisfied with current treatment?: yes Symptom severity: mild  Duration of current treatment : chronic Side effects: no Medication compliance: excellent compliance Psychotherapy/counseling: no  Previous psychiatric medications: wellbutrin  Depressed mood: no Anxious mood: no Anhedonia: no Significant weight loss or gain: no Insomnia: no  Fatigue: yes Feelings of worthlessness or guilt: no Impaired concentration/indecisiveness: no Suicidal ideations: no Hopelessness: no Crying spells: no    02/25/2024    8:41 AM 12/03/2023   11:30  AM 11/25/2023    8:23 AM 10/06/2023    2:08 PM 08/20/2023    1:11 PM  Depression screen PHQ 2/9  Decreased Interest 0 0 0 0 0  Down, Depressed, Hopeless 0 0 0 0 0  PHQ - 2 Score 0 0 0 0 0  Altered sleeping 1 0 0  0  Tired, decreased energy 0 0 0  0  Change in appetite 0 0 0  0  Feeling bad or failure about yourself  0 0 0  0  Trouble concentrating 0 0 0  0  Moving slowly or fidgety/restless 0 0 0  0  Suicidal thoughts 0 0 0  0  PHQ-9 Score 1 0  0   0   Difficult doing work/chores Not difficult at all Not difficult at all Not difficult at all       Data saved with a previous flowsheet row definition    She currently lives with: husband, daughter and grandson Menopausal Symptoms: no  Depression Screen done today and results listed below:     02/25/2024    8:41 AM 12/03/2023   11:30 AM 11/25/2023    8:23 AM 10/06/2023    2:08 PM 08/20/2023    1:11 PM  Depression screen PHQ 2/9  Decreased Interest 0 0 0 0 0  Down, Depressed, Hopeless 0 0 0 0 0  PHQ - 2 Score 0 0 0 0 0  Altered sleeping 1 0 0  0  Tired, decreased energy 0 0 0  0  Change in appetite 0 0 0  0  Feeling bad or failure about yourself  0 0 0  0  Trouble concentrating 0 0 0  0  Moving slowly or fidgety/restless 0 0 0  0  Suicidal thoughts 0  0 0  0  PHQ-9 Score 1 0  0   0   Difficult doing work/chores Not difficult at all Not difficult at all Not difficult at all       Data saved with a previous flowsheet row definition    Past Medical History:  Past Medical History:  Diagnosis Date   Arthritis    hands   Diabetes mellitus without complication (HCC)    GERD (gastroesophageal reflux disease)    Hyperlipidemia    Hypertension    NSTEMI (non-ST elevated myocardial infarction) (HCC) 12/18/2023   TIA (transient ischemic attack)    no deficits   Vertigo     Surgical History:  Past Surgical History:  Procedure Laterality Date   BREAST BIOPSY Left 20+ yrs ago   benign   BREAST BIOPSY Left 20+ yrs ago   benign    CATARACT EXTRACTION W/PHACO Right 05/07/2021   Procedure: CATARACT EXTRACTION PHACO AND INTRAOCULAR LENS PLACEMENT (IOC) RIGHT DIABETIC 19.49 02:17.9;  Surgeon: Jaye Fallow, MD;  Location: [facility];  Service: Ophthalmology;  Laterality: Right;  Diabetic   CATARACT EXTRACTION W/PHACO Left 05/21/2021   Procedure: CATARACT EXTRACTION PHACO AND INTRAOCULAR LENS PLACEMENT (IOC) LEFT DIABETIC 18.57 01:42.9 ;  Surgeon: Enola Feliciano Hugger, MD;  Location: [facility];  Service: Ophthalmology;  Laterality: Left;  Diabetic   LEFT HEART CATH AND CORONARY ANGIOGRAPHY N/A 12/21/2023   Procedure: LEFT HEART CATH AND CORONARY ANGIOGRAPHY and possible PCI and stent;  Surgeon: Florencio Cara BIRCH, MD;  Location: [facility];  Service: Cardiovascular;  Laterality: N/A;   ROTATOR CUFF REPAIR Right 09/30/2016   SIGMOIDOSCOPY  1990   TOTAL ABDOMINAL HYSTERECTOMY     total    Medications:  Current Outpatient Medications on File Prior to Visit  Medication Sig   acetaminophen  (TYLENOL ) 650 MG CR tablet Take 1,300 mg by mouth every 8 (eight) hours as needed for pain.   aspirin  EC 81 MG tablet Take 81 mg by mouth daily. Swallow whole.   Dulaglutide  (TRULICITY ) 4.5 MG/0.5ML SOAJ Inject 4.5 mg as directed once a week.   FEROSUL 325 (65 Fe) MG tablet Take 1 tablet by mouth once daily   metFORMIN  (GLUCOPHAGE ) 500 MG tablet Take 500 mg by mouth 2 (two) times daily with a meal.   Multiple Vitamin (MULTIVITAMIN) tablet Take 1 tablet by mouth daily.   nystatin  (NYAMYC ) powder APPLY TOPICALLY FOUR TIMES A DAY AS DIRECTED (Patient taking differently: APPLY TOPICALLY FOUR TIMES A DAY AS DIRECTED PRN)   omeprazole  (PRILOSEC) 20 MG capsule TAKE 1 CAPSULE BY MOUTH TWICE DAILY BEFORE A MEAL   rosuvastatin  (CRESTOR ) 10 MG tablet Take 1 tablet by mouth once daily   No current facility-administered medications on file prior to visit.    Allergies:  Allergies  Allergen Reactions   Lovastatin   Nausea And Vomiting   Bydureon  [Exenatide ] Rash   Losartan Rash    Social History:  Social History   Socioeconomic History   Marital status: Married    Spouse name: Ubaldo   Number of children: 2   Years of education: Not on file   Highest education level: 12th grade  Occupational History   Not on file  Tobacco Use   Smoking status: Never   Smokeless tobacco: Never  Vaping Use   Vaping status: Never Used  Substance and Sexual Activity   Alcohol use: No   Drug use: No   Sexual activity: Not Currently    Birth control/protection: None  Other Topics Concern   Not on file  Social History Narrative   Not on file   Social Drivers of Health   Financial Resource Strain: Patient Declined (02/24/2024)   Overall Financial Resource Strain (CARDIA)    Difficulty of Paying Living Expenses: Patient declined  Recent Concern: Financial Resource Strain - Medium Risk (12/03/2023)   Overall Financial Resource Strain (CARDIA)    Difficulty of Paying Living Expenses: Somewhat hard  Food Insecurity: Patient Declined (02/24/2024)   Hunger Vital Sign    Worried About Running Out of Food in the Last Year: Patient declined    Ran Out of Food in the Last Year: Patient declined  Transportation Needs: No Transportation Needs (02/24/2024)   PRAPARE - Administrator, Civil Service (Medical): No    Lack of Transportation (Non-Medical): No  Physical Activity: Inactive (02/24/2024)   Exercise Vital Sign    Days of Exercise per Week: 0 days    Minutes of Exercise per Session: Not on file  Stress: No Stress Concern Present (02/24/2024)   [employer] of Occupational Health - Occupational Stress Questionnaire    Feeling of Stress: Not at all  Social Connections: Unknown (02/24/2024)   Social Connection and Isolation Panel    Frequency of Communication with Friends and Family: Patient declined    Frequency of Social Gatherings with Friends and Family: Patient declined    Attends Religious  Services: Patient declined    Database Administrator or Organizations: Patient declined    Attends Banker Meetings: Not on file    Marital Status: Married  Recent Concern: Social Connections - Moderately Isolated (12/19/2023)   Social Connection and Isolation Panel    Frequency of Communication with Friends and Family: Three times a week    Frequency of Social Gatherings with Friends and Family: More than three times a week    Attends Religious Services: Never    Database Administrator or Organizations: No    Attends Banker Meetings: Never    Marital Status: Married  Catering Manager Violence: Not At Risk (12/19/2023)   Humiliation, Afraid, Rape, and Kick questionnaire    Fear of Current or Ex-Partner: No    Emotionally Abused: No    Physically Abused: No    Sexually Abused: No   Social History   Tobacco Use  Smoking Status Never  Smokeless Tobacco Never   Social History   Substance and Sexual Activity  Alcohol Use No    Family History:  Family History  Problem Relation Age of Onset   Diabetes Mother    Heart disease Mother    Cancer Father        esophagus/ lung   Cancer Sister        Ovarian   Diabetes Maternal Grandmother    Breast cancer Neg Hx     Past medical history, surgical history, medications, allergies, family history and social history reviewed with patient today and changes made to appropriate areas of the chart.   Review of Systems  Constitutional: Negative.   HENT: Negative.    Eyes: Negative.   Respiratory:  Positive for cough. Negative for hemoptysis, sputum production, shortness of breath and wheezing.   Cardiovascular:  Positive for leg swelling. Negative for chest pain, palpitations, orthopnea, claudication and PND.  Gastrointestinal: Negative.   Genitourinary: Negative.   Musculoskeletal: Negative.   Skin: Negative.   Neurological: Negative.   Endo/Heme/Allergies: Negative.   Psychiatric/Behavioral: Negative.  All other ROS negative except what is listed above and in the HPI.      Objective:    BP 129/77   Pulse 79   Temp 98 F (36.7 C) (Oral)   Ht 5' 6 (1.676 m)   Wt 162 lb 3.2 oz (73.6 kg)   SpO2 100%   BMI 26.18 kg/m   Wt Readings from Last 3 Encounters:  02/25/24 162 lb 3.2 oz (73.6 kg)  12/20/23 158 lb 11.7 oz (72 kg)  12/03/23 148 lb (67.1 kg)    Physical Exam Vitals and nursing note reviewed.  Constitutional:      General: She is not in acute distress.    Appearance: Normal appearance. She is not ill-appearing, toxic-appearing or diaphoretic.  HENT:     Head: Normocephalic and atraumatic.     Right Ear: Tympanic membrane, ear canal and external ear normal. There is no impacted cerumen.     Left Ear: Tympanic membrane, ear canal and external ear normal. There is no impacted cerumen.     Nose: Nose normal. No congestion or rhinorrhea.     Mouth/Throat:     Mouth: Mucous membranes are moist.     Pharynx: Oropharynx is clear. No oropharyngeal exudate or posterior oropharyngeal erythema.  Eyes:     General: No scleral icterus.       Right eye: No discharge.        Left eye: No discharge.     Extraocular Movements: Extraocular movements intact.     Conjunctiva/sclera: Conjunctivae normal.     Pupils: Pupils are equal, round, and reactive to light.  Neck:     Vascular: No carotid bruit.  Cardiovascular:     Rate and Rhythm: Normal rate and regular rhythm.     Pulses: Normal pulses.     Heart sounds: No murmur heard.    No friction rub. No gallop.  Pulmonary:     Effort: Pulmonary effort is normal. No respiratory distress.     Breath sounds: Normal breath sounds. No stridor. No wheezing, rhonchi or rales.  Chest:     Chest wall: No tenderness.  Abdominal:     General: Abdomen is flat. Bowel sounds are normal. There is no distension.     Palpations: Abdomen is soft. There is no mass.     Tenderness: There is no abdominal tenderness. There is no right CVA tenderness,  left CVA tenderness, guarding or rebound.     Hernia: No hernia is present.  Genitourinary:    Comments: Breast and pelvic exams deferred with shared decision making Musculoskeletal:        General: No swelling, tenderness, deformity or signs of injury.     Cervical back: Normal range of motion and neck supple. No rigidity. No muscular tenderness.     Right lower leg: No edema.     Left lower leg: No edema.  Lymphadenopathy:     Cervical: No cervical adenopathy.  Skin:    General: Skin is warm and dry.     Capillary Refill: Capillary refill takes less than 2 seconds.     Coloration: Skin is not jaundiced or pale.     Findings: No bruising, erythema, lesion or rash.  Neurological:     General: No focal deficit present.     Mental Status: She is alert and oriented to person, place, and time. Mental status is at baseline.     Cranial Nerves: No cranial nerve deficit.     Sensory: No sensory deficit.  Motor: No weakness.     Coordination: Coordination normal.     Gait: Gait normal.     Deep Tendon Reflexes: Reflexes normal.  Psychiatric:        Mood and Affect: Mood normal.        Behavior: Behavior normal.        Thought Content: Thought content normal.        Judgment: Judgment normal.     Results for orders placed or performed in visit on 02/25/24  Bayer DCA Hb A1c Waived   Collection Time: 02/25/24  8:39 AM  Result Value Ref Range   HB A1C (BAYER DCA - WAIVED) 6.9 (H) 4.8 - 5.6 %      Assessment & Plan:   Problem List Items Addressed This Visit       Cardiovascular and Mediastinum   Senile purpura   Reassured patient.       Relevant Medications   lisinopril  (ZESTRIL ) 40 MG tablet   metoprolol  succinate (TOPROL -XL) 25 MG 24 hr tablet   spironolactone  (ALDACTONE ) 25 MG tablet   Other Relevant Orders   CBC with Differential/Platelet   Comprehensive metabolic panel with GFR   Stress-induced cardiomyopathy   Continue to follow with cardiology. Call with any  concerns. Continue to monitor.       Relevant Medications   lisinopril  (ZESTRIL ) 40 MG tablet   metoprolol  succinate (TOPROL -XL) 25 MG 24 hr tablet   spironolactone  (ALDACTONE ) 25 MG tablet   Coronary artery disease involving native coronary artery of native heart without angina pectoris   Continue to follow with cardiology. Call with any concerns. Continue to monitor.       Relevant Medications   lisinopril  (ZESTRIL ) 40 MG tablet   metoprolol  succinate (TOPROL -XL) 25 MG 24 hr tablet   spironolactone  (ALDACTONE ) 25 MG tablet     Endocrine   Diabetes (HCC)   A1c doing great at 6.9. Continue current regimen. Call with any concerns. Refills given. Labs drawn today.       Relevant Medications   lisinopril  (ZESTRIL ) 40 MG tablet   Other Relevant Orders   Bayer DCA Hb A1c Waived (Completed)   CBC with Differential/Platelet   Comprehensive metabolic panel with GFR     Musculoskeletal and Integument   Osteoporosis   Due for recheck in February. Await results. Treat as needed.       Relevant Medications   alendronate  (FOSAMAX ) 70 MG tablet     Genitourinary   Benign hypertensive renal disease   Under good control on current regimen. Continue current regimen. Continue to monitor. Call with any concerns. Refills given. Labs drawn today.        Relevant Orders   CBC with Differential/Platelet   Comprehensive metabolic panel with GFR   TSH     Other   Hyperlipidemia   Under good control on current regimen. Continue current regimen. Continue to monitor. Call with any concerns. Refills given. Labs drawn today.       Relevant Medications   lisinopril  (ZESTRIL ) 40 MG tablet   metoprolol  succinate (TOPROL -XL) 25 MG 24 hr tablet   spironolactone  (ALDACTONE ) 25 MG tablet   Other Relevant Orders   CBC with Differential/Platelet   Comprehensive metabolic panel with GFR   Lipid Panel w/o Chol/HDL Ratio   Depression   Under good control on current regimen. Continue current  regimen. Continue to monitor. Call with any concerns. Refills given. Labs drawn today.       Relevant Medications  buPROPion  (WELLBUTRIN  XL) 150 MG 24 hr tablet   Other Visit Diagnoses       Routine general medical examination at a health care facility    -  Primary   Vaccines up to date. Screening labs checked today. Mammo and DEXA ordered. Continue diet and exercise. Call with any concerns.     Encounter for screening mammogram for malignant neoplasm of breast       Mammo ordered.   Relevant Orders   MM 3D SCREENING MAMMOGRAM BILATERAL BREAST     Need for COVID-19 vaccine       COVID vaccine given today.   Relevant Orders   Pfizer Comirnaty Covid -19 Vaccine 55yrs and older (Completed)     Postmenopausal estrogen deficiency       DEXA due in February- ordered today.   Relevant Orders   DG Bone Density        Follow up plan: Return in about 3 months (around 05/25/2024).   LABORATORY TESTING:  - Pap smear: not applicable  IMMUNIZATIONS:   - Tdap: Tetanus vaccination status reviewed: last tetanus booster within 10 years. - Influenza: Up to date - Pneumovax: Up to date - Prevnar: Up to date - COVID: Administered today - HPV: Not applicable - Shingrix vaccine: Given elsewhere  SCREENING: -Mammogram: Ordered today  - Colonoscopy: Up to date  - Bone Density: Ordered today   PATIENT COUNSELING:   Advised to take 1 mg of folate supplement per day if capable of pregnancy.   Sexuality: Discussed sexually transmitted diseases, partner selection, use of condoms, avoidance of unintended pregnancy  and contraceptive alternatives.   Advised to avoid cigarette smoking.  I discussed with the patient that most people either abstain from alcohol or drink within safe limits (<=14/week and <=4 drinks/occasion for males, <=7/weeks and <= 3 drinks/occasion for females) and that the risk for alcohol disorders and other health effects rises proportionally with the number of drinks per  week and how often a drinker exceeds daily limits.  Discussed cessation/primary prevention of drug use and availability of treatment for abuse.   Diet: Encouraged to adjust caloric intake to maintain  or achieve ideal body weight, to reduce intake of dietary saturated fat and total fat, to limit sodium intake by avoiding high sodium foods and not adding table salt, and to maintain adequate dietary potassium and calcium  preferably from fresh fruits, vegetables, and low-fat dairy products.    stressed the importance of regular exercise  Injury prevention: Discussed safety belts, safety helmets, smoke detector, smoking near bedding or upholstery.   Dental health: Discussed importance of regular tooth brushing, flossing, and dental visits.    NEXT PREVENTATIVE PHYSICAL DUE IN 1 YEAR. Return in about 3 months (around 05/25/2024).

## 2024-02-26 LAB — CBC WITH DIFFERENTIAL/PLATELET
Basophils Absolute: 0 x10E3/uL (ref 0.0–0.2)
Basos: 0 %
EOS (ABSOLUTE): 0.1 x10E3/uL (ref 0.0–0.4)
Eos: 2 %
Hematocrit: 32.5 % — ABNORMAL LOW (ref 34.0–46.6)
Hemoglobin: 10.6 g/dL — ABNORMAL LOW (ref 11.1–15.9)
Immature Grans (Abs): 0 x10E3/uL (ref 0.0–0.1)
Immature Granulocytes: 0 %
Lymphocytes Absolute: 2 x10E3/uL (ref 0.7–3.1)
Lymphs: 31 %
MCH: 29.8 pg (ref 26.6–33.0)
MCHC: 32.6 g/dL (ref 31.5–35.7)
MCV: 91 fL (ref 79–97)
Monocytes Absolute: 0.4 x10E3/uL (ref 0.1–0.9)
Monocytes: 7 %
Neutrophils Absolute: 4 x10E3/uL (ref 1.4–7.0)
Neutrophils: 60 %
Platelets: 208 x10E3/uL (ref 150–450)
RBC: 3.56 x10E6/uL — ABNORMAL LOW (ref 3.77–5.28)
RDW: 13.9 % (ref 11.7–15.4)
WBC: 6.5 x10E3/uL (ref 3.4–10.8)

## 2024-02-26 LAB — COMPREHENSIVE METABOLIC PANEL WITH GFR
ALT: 14 IU/L (ref 0–32)
AST: 15 IU/L (ref 0–40)
Albumin: 4.4 g/dL (ref 3.8–4.8)
Alkaline Phosphatase: 106 IU/L (ref 49–135)
BUN/Creatinine Ratio: 24 (ref 12–28)
BUN: 32 mg/dL — ABNORMAL HIGH (ref 8–27)
Bilirubin Total: 0.4 mg/dL (ref 0.0–1.2)
CO2: 23 mmol/L (ref 20–29)
Calcium: 9.1 mg/dL (ref 8.7–10.3)
Chloride: 101 mmol/L (ref 96–106)
Creatinine, Ser: 1.36 mg/dL — ABNORMAL HIGH (ref 0.57–1.00)
Globulin, Total: 2.2 g/dL (ref 1.5–4.5)
Glucose: 139 mg/dL — ABNORMAL HIGH (ref 70–99)
Potassium: 5.1 mmol/L (ref 3.5–5.2)
Sodium: 137 mmol/L (ref 134–144)
Total Protein: 6.6 g/dL (ref 6.0–8.5)
eGFR: 40 mL/min/1.73 — ABNORMAL LOW (ref >59)

## 2024-02-26 LAB — LIPID PANEL W/O CHOL/HDL RATIO
Cholesterol, Total: 135 mg/dL (ref 100–199)
HDL: 56 mg/dL (ref >39)
LDL Chol Calc (NIH): 63 mg/dL (ref 0–99)
Triglycerides: 85 mg/dL (ref 0–149)
VLDL Cholesterol Cal: 16 mg/dL (ref 5–40)

## 2024-02-26 LAB — TSH: TSH: 3.13 u[IU]/mL (ref 0.450–4.500)

## 2024-03-03 ENCOUNTER — Ambulatory Visit: Payer: Self-pay | Admitting: Family Medicine

## 2024-03-08 ENCOUNTER — Telehealth: Payer: Self-pay | Admitting: Family Medicine

## 2024-03-08 NOTE — Telephone Encounter (Unsigned)
 Copied from CRM #[id number]. Topic: Clinical - Medication Question >> Mar 08, 2024  9:40 AM Willma SAUNDERS wrote: Reason for CRM: Suzen from [facility] pharmacy is calling for clarification for patients prescription glucose blood test strip. System shows discontinued but [facility] states they are trying to refill, but need the instructions to specify how many strips the patient should use daily.  Suzen can be reached at [phone]

## 2024-03-23 NOTE — Telephone Encounter (Signed)
 Left 2nd msg pt is due for re-enrollment on [facility] [employer] trulicity , left a HIPAA VM mail out pt portion ,faxed provider portion.

## 2024-03-23 NOTE — Telephone Encounter (Signed)
 Pt call back explain she has not mail pap back because pt was waiting on proof of income,pt explain she recently received ss paperwork and will either mail back or bring it to provider office.

## 2024-04-01 NOTE — Telephone Encounter (Signed)
 Received provider portion pap [employer] (Trulicity ) waiting on pt portion.

## 2024-04-27 NOTE — Telephone Encounter (Signed)
 Received pt portion Alison Hernandez Trulicity ,faxed to [employer] along provider portion,proof of income Ins card.

## 2024-05-25 ENCOUNTER — Ambulatory Visit: Admitting: Family Medicine

## 2024-12-15 ENCOUNTER — Ambulatory Visit
